# Patient Record
Sex: Female | Born: 1935
Health system: Southern US, Community
[De-identification: ages and names within clinical notes are randomized; demographics above are authoritative.]

## PROBLEM LIST (undated history)

## (undated) DIAGNOSIS — Z9221 Personal history of antineoplastic chemotherapy: Secondary | ICD-10-CM

## (undated) DIAGNOSIS — M48061 Spinal stenosis, lumbar region without neurogenic claudication: Secondary | ICD-10-CM

## (undated) DIAGNOSIS — M25561 Pain in right knee: Secondary | ICD-10-CM

## (undated) DIAGNOSIS — I10 Essential (primary) hypertension: Secondary | ICD-10-CM

## (undated) DIAGNOSIS — C50919 Malignant neoplasm of unspecified site of unspecified female breast: Secondary | ICD-10-CM

## (undated) HISTORY — PX: TONSILLECTOMY: SUR1361

## (undated) HISTORY — PX: TOTAL ABDOMINAL HYSTERECTOMY W/ BILATERAL SALPINGOOPHORECTOMY: SHX83

## (undated) HISTORY — PX: BREAST BIOPSY: SHX20

## (undated) HISTORY — PX: HYSTERECTOMY ABDOMINAL WITH SALPINGECTOMY: SHX6725

## (undated) HISTORY — DX: Pain in right knee: M25.561

## (undated) HISTORY — DX: Spinal stenosis, lumbar region without neurogenic claudication: M48.061

## (undated) HISTORY — PX: ABDOMINAL HYSTERECTOMY: SHX81

---

## 1999-05-31 ENCOUNTER — Ambulatory Visit (HOSPITAL_COMMUNITY): Admission: RE | Admit: 1999-05-31 | Discharge: 1999-05-31 | Payer: Self-pay | Admitting: Gastroenterology

## 1999-05-31 ENCOUNTER — Encounter: Payer: Self-pay | Admitting: Gastroenterology

## 2005-01-02 ENCOUNTER — Encounter: Admission: RE | Admit: 2005-01-02 | Discharge: 2005-01-02 | Payer: Self-pay | Admitting: Family Medicine

## 2014-07-27 LAB — BASIC METABOLIC PANEL
BUN: 15 (ref 4–21)
CREATININE: 0.7 (ref 0.5–1.1)
Glucose: 107
POTASSIUM: 4.1 (ref 3.4–5.3)
SODIUM: 138 (ref 137–147)

## 2014-07-27 LAB — HEPATIC FUNCTION PANEL
ALK PHOS: 80 (ref 25–125)
ALT: 16 (ref 7–35)
AST: 19 (ref 13–35)
Bilirubin, Total: 0.6

## 2014-07-27 LAB — CBC AND DIFFERENTIAL
HEMATOCRIT: 42 (ref 36–46)
HEMOGLOBIN: 13.9 (ref 12.0–16.0)
Platelets: 176 (ref 150–399)
WBC: 6.4

## 2014-07-27 LAB — VITAMIN D 25 HYDROXY (VIT D DEFICIENCY, FRACTURES): VIT D 25 HYDROXY: 21.9

## 2015-01-27 LAB — VITAMIN D 25 HYDROXY (VIT D DEFICIENCY, FRACTURES): Vit D, 25-Hydroxy: 35.3

## 2015-07-28 ENCOUNTER — Other Ambulatory Visit: Payer: Self-pay | Admitting: Orthopedic Surgery

## 2015-07-28 DIAGNOSIS — M5136 Other intervertebral disc degeneration, lumbar region: Secondary | ICD-10-CM

## 2015-08-02 LAB — LIPID PANEL
CHOLESTEROL: 214 — AB (ref 0–200)
HDL: 74 — AB (ref 35–70)
LDL CALC: 111
TRIGLYCERIDES: 144 (ref 40–160)

## 2015-08-02 LAB — BASIC METABOLIC PANEL
BUN: 15 (ref 4–21)
Creatinine: 0.7 (ref 0.5–1.1)
Glucose: 94
Potassium: 3.9 (ref 3.4–5.3)
SODIUM: 140 (ref 137–147)

## 2015-08-02 LAB — HEPATIC FUNCTION PANEL
ALK PHOS: 70 (ref 25–125)
ALT: 14 (ref 7–35)
AST: 20 (ref 13–35)
BILIRUBIN, TOTAL: 0.6

## 2015-08-02 LAB — VITAMIN D 25 HYDROXY (VIT D DEFICIENCY, FRACTURES): Vit D, 25-Hydroxy: 32.8

## 2015-08-16 ENCOUNTER — Ambulatory Visit
Admission: RE | Admit: 2015-08-16 | Discharge: 2015-08-16 | Disposition: A | Discharge status: Home / Self Care | Payer: Self-pay | Source: Ambulatory Visit | Attending: Orthopedic Surgery | Admitting: Orthopedic Surgery

## 2015-08-16 ENCOUNTER — Other Ambulatory Visit: Payer: Self-pay | Admitting: Orthopedic Surgery

## 2015-08-16 ENCOUNTER — Ambulatory Visit
Admission: RE | Admit: 2015-08-16 | Discharge: 2015-08-16 | Disposition: A | Discharge status: Home / Self Care | Payer: Medicare Other | Source: Ambulatory Visit | Attending: Orthopedic Surgery | Admitting: Orthopedic Surgery

## 2015-08-16 DIAGNOSIS — M5137 Other intervertebral disc degeneration, lumbosacral region: Secondary | ICD-10-CM

## 2015-08-16 DIAGNOSIS — M5136 Other intervertebral disc degeneration, lumbar region: Secondary | ICD-10-CM

## 2015-08-16 MED ORDER — IOPAMIDOL (ISOVUE-M 200) INJECTION 41%
15.0000 mL | Freq: Once | INTRAMUSCULAR | Status: AC
  Administered 2015-08-16: 15 mL via INTRATHECAL

## 2015-08-16 NOTE — Discharge Instructions (Signed)

## 2016-07-31 ENCOUNTER — Emergency Department (HOSPITAL_COMMUNITY): Payer: Medicare Other

## 2016-07-31 ENCOUNTER — Emergency Department (HOSPITAL_COMMUNITY)
Admission: EM | Admit: 2016-07-31 | Discharge: 2016-07-31 | Disposition: A | Discharge status: Home / Self Care | Payer: Medicare Other | Attending: Emergency Medicine | Admitting: Emergency Medicine

## 2016-07-31 ENCOUNTER — Encounter (HOSPITAL_COMMUNITY): Payer: Self-pay | Admitting: *Deleted

## 2016-07-31 DIAGNOSIS — J0100 Acute maxillary sinusitis, unspecified: Secondary | ICD-10-CM | POA: Diagnosis not present

## 2016-07-31 DIAGNOSIS — Z79899 Other long term (current) drug therapy: Secondary | ICD-10-CM | POA: Insufficient documentation

## 2016-07-31 DIAGNOSIS — R531 Weakness: Secondary | ICD-10-CM | POA: Diagnosis present

## 2016-07-31 LAB — CBC
HEMATOCRIT: 41.1 % (ref 36.0–46.0)
Hemoglobin: 13.5 g/dL (ref 12.0–15.0)
MCH: 30.7 pg (ref 26.0–34.0)
MCHC: 32.8 g/dL (ref 30.0–36.0)
MCV: 93.4 fL (ref 78.0–100.0)
PLATELETS: 185 10*3/uL (ref 150–400)
RBC: 4.4 MIL/uL (ref 3.87–5.11)
RDW: 13.6 % (ref 11.5–15.5)
WBC: 7.1 10*3/uL (ref 4.0–10.5)

## 2016-07-31 LAB — URINALYSIS, ROUTINE W REFLEX MICROSCOPIC
BACTERIA UA: NONE SEEN
Bilirubin Urine: NEGATIVE
GLUCOSE, UA: NEGATIVE mg/dL
Hgb urine dipstick: NEGATIVE
Ketones, ur: NEGATIVE mg/dL
Nitrite: NEGATIVE
PH: 7 (ref 5.0–8.0)
Protein, ur: NEGATIVE mg/dL
SPECIFIC GRAVITY, URINE: 1.01 (ref 1.005–1.030)

## 2016-07-31 LAB — BASIC METABOLIC PANEL
Anion gap: 6 (ref 5–15)
BUN: 18 mg/dL (ref 6–20)
CHLORIDE: 106 mmol/L (ref 101–111)
CO2: 26 mmol/L (ref 22–32)
CREATININE: 0.79 mg/dL (ref 0.44–1.00)
Calcium: 10.7 mg/dL — ABNORMAL HIGH (ref 8.9–10.3)
GFR calc Af Amer: >60 mL/min (ref >60)
GFR calc non Af Amer: >60 mL/min (ref >60)
Glucose, Bld: 121 mg/dL — ABNORMAL HIGH (ref 65–99)
Potassium: 4.2 mmol/L (ref 3.5–5.1)
SODIUM: 138 mmol/L (ref 135–145)

## 2016-07-31 MED ORDER — DEXAMETHASONE 4 MG PO TABS
10.0000 mg | ORAL_TABLET | Freq: Once | ORAL | Status: AC
  Administered 2016-07-31: 10 mg via ORAL
  Filled 2016-07-31: qty 3

## 2016-07-31 MED ORDER — AMOXICILLIN-POT CLAVULANATE 875-125 MG PO TABS
1.0000 | ORAL_TABLET | Freq: Once | ORAL | Status: AC
Start: 2016-07-31 — End: 2016-07-31
  Administered 2016-07-31: 1 via ORAL
  Filled 2016-07-31: qty 1

## 2016-07-31 MED ORDER — AMOXICILLIN-POT CLAVULANATE 875-125 MG PO TABS: 1.0000 | ORAL_TABLET | Freq: Two times a day (BID) | ORAL | 0 refills | Status: DC

## 2016-07-31 NOTE — ED Notes (Signed)
Upon attempting to give patient Augmentin and decadron, pt dropped augmentin and 1 decadron tablet on the floor. Pharmacy made aware. Augmentin and decadron pulled from pyxis again

## 2016-07-31 NOTE — ED Triage Notes (Signed)
C/o not feeling well for several days states she woke up with ringing in  Her ears  C/o feeling weak. Hot flashes

## 2016-07-31 NOTE — ED Notes (Signed)
Pt placed in a gown  and on cardiac monitor

## 2016-07-31 NOTE — ED Provider Notes (Signed)
[facility] DEPT Provider Note   CSN: [id number] Arrival date & time: 07/31/16  1950     History   Chief Complaint Chief Complaint  Patient presents with  . Weakness    HPI Jamie [name] is a 82 y.o. female.  The history is provided by the patient and medical records. No language interpreter was used.  Dizziness  Quality:  Lightheadedness Severity:  Moderate Onset quality:  Gradual Duration:  1 week Timing:  Intermittent Progression:  Unchanged Relieved by:  Nothing Worsened by:  Nothing Ineffective treatments:  Change in position, turning head, food and fluids Associated symptoms: tinnitus   Associated symptoms: no chest pain, no palpitations, no shortness of breath and no vomiting     History reviewed. No pertinent past medical history.  There are no active problems to display for this patient.   Past Surgical History:  Procedure Laterality Date  . ABDOMINAL HYSTERECTOMY      OB History    No data available       Home Medications    Prior to Admission medications   Medication Sig Start Date End Date Taking? Authorizing Provider  amoxicillin-clavulanate (AUGMENTIN) 875-125 MG tablet Take 1 tablet by mouth every 12 (twelve) hours. 07/31/16   Payton Emerald, MD    Family History No family history on file.  Social History Social History  Substance Use Topics  . Smoking status: Never Smoker  . Smokeless tobacco: Never Used  . Alcohol use No     Allergies   Patient has no known allergies.   Review of Systems Review of Systems  Constitutional: Negative for chills and fever.  HENT: Positive for tinnitus. Negative for ear pain and sore throat.   Eyes: Negative for pain and visual disturbance.  Respiratory: Negative for cough and shortness of breath.   Cardiovascular: Negative for chest pain and palpitations.  Gastrointestinal: Negative for abdominal pain and vomiting.  Genitourinary: Negative for dysuria and hematuria.  Musculoskeletal:  Negative for arthralgias and back pain.  Skin: Negative for color change and rash.  Neurological: Positive for dizziness. Negative for seizures and syncope.  All other systems reviewed and are negative.    Physical Exam Updated Vital Signs BP (!) 168/74   Pulse 65   Temp 97.7 F (36.5 C) (Oral)   Resp 16   Ht 5\' 9"  (1.753 m)   Wt 78 kg (172 lb)   SpO2 100%   BMI 25.40 kg/m   Physical Exam  Constitutional: She appears well-developed. No distress.  HENT:  Head: Normocephalic and atraumatic.  Eyes: Conjunctivae are normal.  Neck: Neck supple.  Cardiovascular: Normal rate and regular rhythm.   No murmur heard. Pulmonary/Chest: Effort normal and breath sounds normal. No respiratory distress.  Abdominal: Soft. There is no tenderness.  Musculoskeletal: She exhibits no edema or deformity.  Neurological: She is alert. No cranial nerve deficit. Coordination normal.  5/5 motor strength and intact sensation in all extremities. Finger-to-nose intact bilaterally  Skin: Skin is warm and dry.  Nursing note and vitals reviewed.    ED Treatments / Results  Labs (all labs ordered are listed, but only abnormal results are displayed) Labs Reviewed  BASIC METABOLIC PANEL - Abnormal; Notable for the following:       Result Value   Glucose, Bld 121 (*)    Calcium 10.7 (*)    All other components within normal limits  URINALYSIS, ROUTINE W REFLEX MICROSCOPIC - Abnormal; Notable for the following:    Color, Urine STRAW (*)  Leukocytes, UA MODERATE (*)    Squamous Epithelial / LPF 0-5 (*)    All other components within normal limits  CBC  CBG MONITORING, ED    EKG  EKG Interpretation  Date/Time:  Monday July 31 2016 09:26:01 EDT Ventricular Rate:  71 PR Interval:  154 QRS Duration: 86 QT Interval:  364 QTC Calculation: 395 R Axis:   -28 Text Interpretation:  Normal sinus rhythm Left ventricular hypertrophy Abnormal ECG No old tracing to compare Confirmed by Deno Etienne [phone])  on 07/31/2016 1:12:44 PM       Radiology Mr Brain Wo Contrast  Result Date: 07/31/2016 CLINICAL DATA:  Lightheadedness, tinnitus. Symptoms for several days. EXAM: MRI HEAD WITHOUT CONTRAST TECHNIQUE: Multiplanar, multiecho pulse sequences of the brain and surrounding structures were obtained without intravenous contrast. COMPARISON:  None. FINDINGS: Brain: No evidence for acute infarction, hemorrhage, mass lesion, hydrocephalus, or extra-axial fluid. Mild atrophy. Mild subcortical and periventricular T2 and FLAIR hyperintensities, likely chronic microvascular ischemic change. Vascular: Flow voids are maintained throughout the carotid, basilar, and vertebral arteries. There are no areas of chronic hemorrhage. Skull and upper cervical spine: Unremarkable visualized calvarium, skullbase, and cervical vertebrae. Pituitary, pineal, cerebellar tonsils unremarkable. Moderate pannus surrounds the odontoid, effacing anterior subarachnoid space, but no cervicomedullary compression. Sinuses/Orbits: Acute layering fluid in the LEFT maxillary sinus. No orbital findings. BILATERAL cataract extraction. Other: None. IMPRESSION: Atrophy and small vessel disease.  No acute intracranial findings. Moderate pannus surrounds the odontoid, correlate clinically for rheumatoid or psoriatic arthritis. No cervicomedullary compression. Acute LEFT maxillary sinusitis. Electronically Signed   By: Staci Righter M.D.   On: 07/31/2016 14:37    Procedures Procedures (including critical care time)  Medications Ordered in ED Medications  dexamethasone (DECADRON) tablet 10 mg (10 mg Oral Given 07/31/16 1524)  amoxicillin-clavulanate (AUGMENTIN) 875-125 MG per tablet 1 tablet (1 tablet Oral Given 07/31/16 1524)     Initial Impression / Assessment and Plan / ED Course  I have reviewed the triage vital signs and the nursing notes.  Pertinent labs & imaging results that were available during my care of the patient were reviewed by me  and considered in my medical decision making (see chart for details).     82 year old female history of hyperlipidemia who presents with 1 week of lightheadedness and tinnitus.  She describes intermittent episodes of tinnitus and lightheadedness, worsening over past day.  She denies any head trauma or falls.  Denies any exacerbation with head turning or position changes.  No previous strokes.  She only takes a statin for hyperlipidemia.  No other anticoagulation.  She denies previous episodes of tinnitus.  Tinnitus was constant and severe this morning.  However has mostly resolved since arrival.  No recent fevers or other infectious symptoms.  Denies other pain.  Patient also concerned about elevated blood pressure but does not know where she normally runs.  Systolic BP ranging from 185-631.  Afebrile, VSS.  No focal neuro deficits.  Lungs clear to auscultation bilaterally.  Abdomen soft benign throughout.  CBC, BMP unremarkable.  UA without evidence UTI.  EKG showing normal sinus rhythm and LVH.  MRI brain showing acute left maxillary sinusitis.  Possible labyrinthitis given tinnitus and dizziness.  Decadron and Augmentin given.  She is ambulating well and tolerating p.o., stable continue outpatient follow-up.  Return precautions provided for worsening symptoms. Pt will f/u with PCP at first availability. Pt verbalized agreement with plan.  Pt care d/w Dr. Tyrone Nine  Final Clinical Impressions(s) / ED Diagnoses  Final diagnoses:  Acute maxillary sinusitis, recurrence not specified    New Prescriptions Discharge Medication List as of 07/31/2016  3:05 PM    START taking these medications   Details  amoxicillin-clavulanate (AUGMENTIN) 875-125 MG tablet Take 1 tablet by mouth every 12 (twelve) hours., Starting Mon 07/31/2016, Print         Payton Emerald, MD 07/31/16 [place], [place], DO 08/01/16 1002

## 2016-07-31 NOTE — ED Notes (Signed)
Patient transported to MRI 

## 2016-08-14 ENCOUNTER — Observation Stay (HOSPITAL_COMMUNITY)
Admission: EM | Admit: 2016-08-14 | Discharge: 2016-08-15 | Disposition: A | Discharge status: Home / Self Care | Payer: Medicare Other | Attending: Internal Medicine | Admitting: Internal Medicine

## 2016-08-14 ENCOUNTER — Emergency Department (HOSPITAL_COMMUNITY): Payer: Medicare Other

## 2016-08-14 ENCOUNTER — Encounter (HOSPITAL_COMMUNITY): Payer: Self-pay

## 2016-08-14 DIAGNOSIS — R82998 Other abnormal findings in urine: Secondary | ICD-10-CM

## 2016-08-14 DIAGNOSIS — R21 Rash and other nonspecific skin eruption: Secondary | ICD-10-CM | POA: Insufficient documentation

## 2016-08-14 DIAGNOSIS — R8299 Other abnormal findings in urine: Secondary | ICD-10-CM

## 2016-08-14 DIAGNOSIS — K7689 Other specified diseases of liver: Secondary | ICD-10-CM | POA: Insufficient documentation

## 2016-08-14 DIAGNOSIS — I739 Peripheral vascular disease, unspecified: Secondary | ICD-10-CM | POA: Insufficient documentation

## 2016-08-14 DIAGNOSIS — K529 Noninfective gastroenteritis and colitis, unspecified: Secondary | ICD-10-CM | POA: Diagnosis present

## 2016-08-14 DIAGNOSIS — Z9841 Cataract extraction status, right eye: Secondary | ICD-10-CM | POA: Diagnosis not present

## 2016-08-14 DIAGNOSIS — K5900 Constipation, unspecified: Secondary | ICD-10-CM | POA: Diagnosis not present

## 2016-08-14 DIAGNOSIS — K5100 Ulcerative (chronic) pancolitis without complications: Secondary | ICD-10-CM

## 2016-08-14 DIAGNOSIS — Z90710 Acquired absence of both cervix and uterus: Secondary | ICD-10-CM | POA: Insufficient documentation

## 2016-08-14 DIAGNOSIS — R42 Dizziness and giddiness: Secondary | ICD-10-CM | POA: Insufficient documentation

## 2016-08-14 DIAGNOSIS — H9319 Tinnitus, unspecified ear: Secondary | ICD-10-CM | POA: Insufficient documentation

## 2016-08-14 DIAGNOSIS — E785 Hyperlipidemia, unspecified: Secondary | ICD-10-CM | POA: Insufficient documentation

## 2016-08-14 DIAGNOSIS — J0100 Acute maxillary sinusitis, unspecified: Secondary | ICD-10-CM | POA: Diagnosis not present

## 2016-08-14 DIAGNOSIS — K921 Melena: Secondary | ICD-10-CM | POA: Diagnosis not present

## 2016-08-14 DIAGNOSIS — Z9842 Cataract extraction status, left eye: Secondary | ICD-10-CM | POA: Insufficient documentation

## 2016-08-14 DIAGNOSIS — I10 Essential (primary) hypertension: Secondary | ICD-10-CM

## 2016-08-14 DIAGNOSIS — D72829 Elevated white blood cell count, unspecified: Secondary | ICD-10-CM | POA: Insufficient documentation

## 2016-08-14 DIAGNOSIS — D649 Anemia, unspecified: Secondary | ICD-10-CM | POA: Diagnosis not present

## 2016-08-14 DIAGNOSIS — I700 Atherosclerosis of aorta: Secondary | ICD-10-CM | POA: Diagnosis not present

## 2016-08-14 HISTORY — DX: Ulcerative (chronic) pancolitis without complications: K51.00

## 2016-08-14 HISTORY — DX: Other abnormal findings in urine: R82.998

## 2016-08-14 HISTORY — DX: Essential (primary) hypertension: I10

## 2016-08-14 LAB — URINALYSIS, ROUTINE W REFLEX MICROSCOPIC
Bilirubin Urine: NEGATIVE
GLUCOSE, UA: NEGATIVE mg/dL
HGB URINE DIPSTICK: NEGATIVE
Ketones, ur: 5 mg/dL — AB
Nitrite: NEGATIVE
PH: 7 (ref 5.0–8.0)
PROTEIN: NEGATIVE mg/dL
Specific Gravity, Urine: 1.032 — ABNORMAL HIGH (ref 1.005–1.030)

## 2016-08-14 LAB — LIPASE, BLOOD: Lipase: 26 U/L (ref 11–51)

## 2016-08-14 LAB — COMPREHENSIVE METABOLIC PANEL
ALK PHOS: 72 U/L (ref 38–126)
ALT: 17 U/L (ref 14–54)
AST: 22 U/L (ref 15–41)
Albumin: 4 g/dL (ref 3.5–5.0)
Anion gap: 8 (ref 5–15)
BILIRUBIN TOTAL: 1.4 mg/dL — AB (ref 0.3–1.2)
BUN: 17 mg/dL (ref 6–20)
CO2: 24 mmol/L (ref 22–32)
Calcium: 9.9 mg/dL (ref 8.9–10.3)
Chloride: 102 mmol/L (ref 101–111)
Creatinine, Ser: 0.84 mg/dL (ref 0.44–1.00)
GFR calc Af Amer: >60 mL/min (ref >60)
Glucose, Bld: 132 mg/dL — ABNORMAL HIGH (ref 65–99)
POTASSIUM: 4 mmol/L (ref 3.5–5.1)
Sodium: 134 mmol/L — ABNORMAL LOW (ref 135–145)
TOTAL PROTEIN: 6.7 g/dL (ref 6.5–8.1)

## 2016-08-14 LAB — DIFFERENTIAL
BASOS ABS: 0 10*3/uL (ref 0.0–0.1)
BASOS PCT: 0 %
EOS ABS: 0 10*3/uL (ref 0.0–0.7)
Eosinophils Relative: 0 %
Lymphocytes Relative: 7 %
Lymphs Abs: 1.7 10*3/uL (ref 0.7–4.0)
Monocytes Absolute: 2.1 10*3/uL — ABNORMAL HIGH (ref 0.1–1.0)
Monocytes Relative: 10 %
NEUTROS ABS: 18.7 10*3/uL — AB (ref 1.7–7.7)
Neutrophils Relative %: 83 %

## 2016-08-14 LAB — TYPE AND SCREEN
ABO/RH(D): O NEG
Antibody Screen: NEGATIVE

## 2016-08-14 LAB — LACTIC ACID, PLASMA: Lactic Acid, Venous: 1.4 mmol/L (ref 0.5–1.9)

## 2016-08-14 LAB — POC OCCULT BLOOD, ED: FECAL OCCULT BLD: POSITIVE — AB

## 2016-08-14 LAB — ABO/RH: ABO/RH(D): O NEG

## 2016-08-14 MED ORDER — IOPAMIDOL (ISOVUE-300) INJECTION 61%
INTRAVENOUS | Status: AC
  Administered 2016-08-14: 100 mL
  Filled 2016-08-14: qty 100

## 2016-08-14 MED ORDER — ONDANSETRON HCL 4 MG/2ML IJ SOLN: 4.0000 mg | Freq: Four times a day (QID) | INTRAMUSCULAR | Status: DC | PRN

## 2016-08-14 MED ORDER — SODIUM CHLORIDE 0.9 % IV BOLUS (SEPSIS)
1000.0000 mL | Freq: Once | INTRAVENOUS | Status: AC
  Administered 2016-08-14: 1000 mL via INTRAVENOUS

## 2016-08-14 MED ORDER — ONDANSETRON HCL 4 MG PO TABS
4.0000 mg | ORAL_TABLET | Freq: Four times a day (QID) | ORAL | Status: DC | PRN
Start: 2016-08-14 — End: 2016-08-15

## 2016-08-14 MED ORDER — HYDROCODONE-ACETAMINOPHEN 5-325 MG PO TABS: 1.0000 | ORAL_TABLET | ORAL | Status: DC | PRN

## 2016-08-14 MED ORDER — ACETAMINOPHEN 650 MG RE SUPP
650.0000 mg | Freq: Four times a day (QID) | RECTAL | Status: DC | PRN
Start: 2016-08-14 — End: 2016-08-15

## 2016-08-14 MED ORDER — VANCOMYCIN 50 MG/ML ORAL SOLUTION
125.0000 mg | Freq: Four times a day (QID) | ORAL | Status: DC
  Administered 2016-08-14 – 2016-08-15 (×3): 125 mg via ORAL
  Filled 2016-08-14 (×4): qty 2.5

## 2016-08-14 MED ORDER — ACETAMINOPHEN 325 MG PO TABS: 650.0000 mg | ORAL_TABLET | Freq: Four times a day (QID) | ORAL | Status: DC | PRN

## 2016-08-14 MED ORDER — PRAVASTATIN SODIUM 40 MG PO TABS
40.0000 mg | ORAL_TABLET | Freq: Every day | ORAL | Status: DC
  Administered 2016-08-14 – 2016-08-15 (×2): 40 mg via ORAL
  Filled 2016-08-14 (×2): qty 1

## 2016-08-14 MED ORDER — SODIUM CHLORIDE 0.9 % IV SOLN
INTRAVENOUS | Status: DC
  Administered 2016-08-14 – 2016-08-15 (×2): via INTRAVENOUS

## 2016-08-14 MED ORDER — DIPHENHYDRAMINE HCL 50 MG/ML IJ SOLN
25.0000 mg | Freq: Three times a day (TID) | INTRAMUSCULAR | Status: DC | PRN
  Administered 2016-08-14: 25 mg via INTRAVENOUS
  Filled 2016-08-14: qty 1

## 2016-08-14 MED ORDER — METRONIDAZOLE IN NACL 5-0.79 MG/ML-% IV SOLN
500.0000 mg | Freq: Once | INTRAVENOUS | Status: AC
  Administered 2016-08-14: 500 mg via INTRAVENOUS
  Filled 2016-08-14: qty 100

## 2016-08-14 MED ORDER — LOSARTAN POTASSIUM 50 MG PO TABS
50.0000 mg | ORAL_TABLET | Freq: Every day | ORAL | Status: DC
  Filled 2016-08-14: qty 1

## 2016-08-14 MED ORDER — DIPHENHYDRAMINE HCL 50 MG/ML IJ SOLN: 25.0000 mg | Freq: Once | INTRAMUSCULAR | Status: DC

## 2016-08-14 MED ORDER — CIPROFLOXACIN IN D5W 400 MG/200ML IV SOLN
400.0000 mg | Freq: Once | INTRAVENOUS | Status: DC
  Filled 2016-08-14: qty 200

## 2016-08-14 MED ORDER — ENOXAPARIN SODIUM 40 MG/0.4ML ~~LOC~~ SOLN
40.0000 mg | SUBCUTANEOUS | Status: DC
  Administered 2016-08-14: 40 mg via SUBCUTANEOUS
  Filled 2016-08-14: qty 0.4

## 2016-08-14 MED ORDER — ASPIRIN 325 MG PO TABS: 325.0000 mg | ORAL_TABLET | Freq: Four times a day (QID) | ORAL | Status: DC | PRN

## 2016-08-14 NOTE — ED Notes (Signed)
Pt endorses weakness upon standing for orthostatics.  Denies any lightheadedness or dizziness.

## 2016-08-14 NOTE — ED Notes (Signed)
Patient transported to CT 

## 2016-08-14 NOTE — ED Triage Notes (Addendum)
Pt states she has had lower abd pain since Saturday. She reports she was able to have a bowel movement and noticed a significant amount of bright red blood. Pt states hx of hemorrhoids but this bleeding was more severe. Pt appears pale in color.

## 2016-08-14 NOTE — H&P (Signed)
History and Physical    LILLIEN PETRONIO IEP:[id number] DOB: 09/12/1934 DOA: 08/14/2016   PCP: [name], MD   Patient coming from:  Home    Chief Complaint:Abdominal pain, diarrhea  HPI: Jamie [name] is a 82 y.o. female with medical history significant for HTN presenting to the ED with abdominal cramping, pain and blood in the stools. In review, the patient had just finished a course of local men team for the treatment of sinusitis. 3 days ago, she attended a picnic, and shortly thereafter, she began to experience abdominal cramping, with nausea without vomiting. She was unable to have a bowel movement, so she had 2 Dulcolax suppositories, with subsequent diarrhea, with blood in her stools in about 3 episodes. On presentation, she has no further episodes of blow the stools. She has poor appetite, continues to be nauseated, and has subjective chills. She denies any fever, or night sweats.Denies any respiratory complaints. Denies any chest pain or palpitations. Denies lower extremity swelling Denies any dysuria. Denies abnormal skin rashes, or neuropathy. Denies any bleeding issues such as epistaxis, hematemesis, hematuria or hematochezia.   ED Course:  BP (!) 126/57   Pulse 73   Temp 98.7 F (37.1 C) (Oral)   Resp 18   SpO2 98%    hcult + WBC  22_> 12 -> 7.1 after hydration and Abx  Hb 14 CT abd pelvis + pancolitis, concern for CDiff. No abscess or free air  CDiff panel pending Received initially Cipro FLagyl, currently on oral Vanc and Flagyl UA Mod leuko  GLu 132  Lipase 26 T bil 1.4   Review of Systems:  As per HPI otherwise all other systems reviewed and are negative  Past Medical History:  Diagnosis Date  . Hypertension     Past Surgical History:  Procedure Laterality Date  . ABDOMINAL HYSTERECTOMY      Social History Social History   Social History  . Marital status: Single    Spouse name: N/A  . Number of children: N/A  . Years of education: N/A    Occupational History  . Not on file.   Social History Main Topics  . Smoking status: Never Smoker  . Smokeless tobacco: Never Used  . Alcohol use No  . Drug use: No  . Sexual activity: Not on file   Other Topics Concern  . Not on file   Social History Narrative  . No narrative on file     No Known Allergies  History reviewed. No pertinent family history.    Prior to Admission medications   Medication Sig Start Date End Date Taking? Authorizing Provider  aspirin 325 MG tablet Take 325 mg by mouth every 6 (six) hours as needed for mild pain or headache.   Yes [provider]  losartan (COZAAR) 50 MG tablet Take 50 mg by mouth daily.   Yes [provider]  Multiple Vitamin (MULTIVITAMIN WITH MINERALS) TABS tablet Take 1 tablet by mouth daily. Shaklee Brand   Yes [provider]  pravastatin (PRAVACHOL) 40 MG tablet Take 40 mg by mouth daily.   Yes [provider]  amoxicillin-clavulanate (AUGMENTIN) 875-125 MG tablet Take 1 tablet by mouth every 12 (twelve) hours. Patient not taking: Reported on 08/14/2016 07/31/16   Payton Emerald, MD    Physical Exam:  Vitals:   08/14/16 1615 08/14/16 1630 08/14/16 1645 08/14/16 1700  BP: (!) 114/47 (!) 132/50 (!) 130/47 (!) 126/57  Pulse: 79 69 72 73  Resp:  Temp:      TempSrc:      SpO2: 98% 96% 96% 98%   Constitutional: NAD, calm, comfortable   Eyes: PERRL, lids and conjunctivae normal ENMT: Mucous membranes are moist, without exudate or lesions  Neck: normal, supple, no masses, no thyromegaly Respiratory: clear to auscultation bilaterally, no wheezing, no crackles. Normal respiratory effort  Cardiovascular: Regular rate and rhythm, mild  murmurs, rubs or gallops. No extremity edema. 2+ pedal pulses. No carotid bruits.  Abdomen: Soft, dissusely mild tenderness especially LUQ,  Bowel sounds positive. NO masses palpable  Musculoskeletal: no clubbing / cyanosis. Moves all extremities Skin: no  jaundice, No lesions.  Neurologic: Sensation intact  Strength equal in all extremities Psychiatric:   Alert and oriented x 3. Normal mood.     Labs on Admission: I have personally reviewed following labs and imaging studies  CBC:  Recent Labs Lab 08/14/16 1134  NEUTROABS 18.7*    Basic Metabolic Panel:  Recent Labs Lab 08/14/16 1134  NA 134*  K 4.0  CL 102  CO2 24  GLUCOSE 132*  BUN 17  CREATININE 0.84  CALCIUM 9.9    GFR: CrCl cannot be calculated (Unknown ideal weight.).  Liver Function Tests:  Recent Labs Lab 08/14/16 1134  AST 22  ALT 17  ALKPHOS 72  BILITOT 1.4*  PROT 6.7  ALBUMIN 4.0    Recent Labs Lab 08/14/16 1134  LIPASE 26   No results for input(s): AMMONIA in the last 168 hours.  Coagulation Profile: No results for input(s): INR, PROTIME in the last 168 hours.  Cardiac Enzymes: No results for input(s): CKTOTAL, CKMB, CKMBINDEX, TROPONINI in the last 168 hours.  BNP (last 3 results) No results for input(s): PROBNP in the last 8760 hours.  HbA1C: No results for input(s): HGBA1C in the last 72 hours.  CBG: No results for input(s): GLUCAP in the last 168 hours.  Lipid Profile: No results for input(s): CHOL, HDL, LDLCALC, TRIG, CHOLHDL, LDLDIRECT in the last 72 hours.  Thyroid Function Tests: No results for input(s): TSH, T4TOTAL, FREET4, T3FREE, THYROIDAB in the last 72 hours.  Anemia Panel: No results for input(s): VITAMINB12, FOLATE, FERRITIN, TIBC, IRON, RETICCTPCT in the last 72 hours.  Urine analysis:    Component Value Date/Time   COLORURINE YELLOW 08/14/2016 1442   APPEARANCEUR CLEAR 08/14/2016 1442   LABSPEC 1.032 (H) 08/14/2016 1442   PHURINE 7.0 08/14/2016 1442   GLUCOSEU NEGATIVE 08/14/2016 1442   HGBUR NEGATIVE 08/14/2016 1442   BILIRUBINUR NEGATIVE 08/14/2016 1442   KETONESUR 5 (A) 08/14/2016 1442   PROTEINUR NEGATIVE 08/14/2016 1442   NITRITE NEGATIVE 08/14/2016 1442   LEUKOCYTESUR MODERATE (A) 08/14/2016  1442    Sepsis Labs: @LABRCNTIP (procalcitonin:4,lacticidven:4) )No results found for this or any previous visit (from the past 240 hour(s)).   Radiological Exams on Admission: Ct Abdomen Pelvis W Contrast  Result Date: 08/14/2016 CLINICAL DATA:  Severe lower abdominal pain over the last 2 days. Constipation. Blood in stool. EXAM: CT ABDOMEN AND PELVIS WITH CONTRAST TECHNIQUE: Multidetector CT imaging of the abdomen and pelvis was performed using the standard protocol following bolus administration of intravenous contrast. CONTRAST:  164mL ISOVUE-300 IOPAMIDOL (ISOVUE-300) INJECTION 61% COMPARISON:  None. FINDINGS: Lower chest: Negative Hepatobiliary: Innumerable subcentimeter low densities within the liver, many of which do not enhance on the delayed imaging. I think most likely diagnosis is multiple hepatic cysts. Certainly, the possibility of other coexistent abnormalities including hemangioma and metastasis is not excluded. No calcified gallstones. Pancreas: Normal Spleen: Normal  Adrenals/Urinary Tract: Adrenal glands are normal. Kidneys are normal. No cyst, mass, stone or hydronephrosis. Stomach/Bowel: Pronounced colitis pattern affecting the the left colon beginning at the distal transverse colon and extending to the descending sigmoid junction. In a person of this age, ischemic colitis is possible. Infectious colitis and inflammatory bowel disease could also cause this appearance. No sign of perforation or abscess. Small bowel appears normal. Vascular/Lymphatic: Aortic atherosclerosis. No aneurysm. IMA appears to show flow. Reproductive: Previous hysterectomy.  No pelvic mass. Other: Small amount of free fluid.  No free air. Musculoskeletal: Chronic degenerative changes affect the lumbar spine. IMPRESSION: Acute colitis pattern beginning at the distal transverse colon and extending to the descending sigmoid junction. Differential diagnosis is ischemic colitis versus infectious colitis versus  inflammatory bowel disease. No evidence of perforation or abscess. Small amount of free fluid. Multiple small low densities within the liver. Most of these included in the delayed imaging region do not enhance. Therefore, these probably represent liver cysts. There could be some admixed hemangiomas. One could not rule out the possibility of some metastatic disease. Liver MRI could most accurately differentiate. Aortic atherosclerosis. Lumbar degenerative changes. Electronically Signed   By: Nelson Chimes M.D.   On: 08/14/2016 14:32    EKG: Independently reviewed.  Assessment/Plan Active Problems:   Pancolitis (HCC)   Hypertension   Leukocytes in urine   Pancolitis, viral versus bacterial Non septic appearing. VSS, Afebrile normal [place]ats WBC normalizing after IVF and antibiotics On oral vanc and Flagyl. Initial Hcult positive, no further bowel movements since admission. Lactic acid pending Admit to med-surg bed  NPO IV antiemetics  C diff pending , check  GI pathogen panel,  Blood culture x 2 IV F 100 cc/h  may consult to GI if getting worse    Hypertension BP ) 126/57   Pulse 73   Controlled Continue home anti-hypertensive medications    Leukocytes in Urine as evidenced by UA. Afebrile, VSS, WBC normalizing, no dysuria  Check urine culture      DVT prophylaxis: Lovenox  Code Status:   Full    Family Communication:  Discussed with patient Disposition Plan: Expect patient to be discharged to home after condition improves Consults called:    None  Admission status Inpatient     [facility] E, PA-C [facility]   08/14/2016, 5:19 PM

## 2016-08-14 NOTE — ED Provider Notes (Signed)
[facility] DEPT Provider Note   CSN: [id number] Arrival date & time: 08/14/16  1122     History   Chief Complaint Chief Complaint  Patient presents with  . Abdominal Pain  . Rectal Bleeding    HPI Jamie [name] is a 82 y.o. female history of hypertension here presenting with blood in the stool, abdominal pain. Patient states that she just finished a course of Augmentin for sinusitis. Patient ate a pick neck 3 days ago and then shortly after has the abdominal cramps. She was unable to have a bowel movement so gave herself Dulcolax suppositories and then subsequently had diarrhea and blood in her stool about 3 episodes. Has poor appetite for the last several days and felt very nauseated. Has subjective chills.     She follows up with Dr. Inda Merlin at [facility].   The history is provided by the patient.    Past Medical History:  Diagnosis Date  . Hypertension     There are no active problems to display for this patient.   Past Surgical History:  Procedure Laterality Date  . ABDOMINAL HYSTERECTOMY      OB History    No data available       Home Medications    Prior to Admission medications   Medication Sig Start Date End Date Taking? Authorizing Provider  amoxicillin-clavulanate (AUGMENTIN) 875-125 MG tablet Take 1 tablet by mouth every 12 (twelve) hours. 07/31/16   Payton Emerald, MD    Family History History reviewed. No pertinent family history.  Social History Social History  Substance Use Topics  . Smoking status: Never Smoker  . Smokeless tobacco: Never Used  . Alcohol use No     Allergies   Patient has no known allergies.   Review of Systems Review of Systems  Gastrointestinal: Positive for abdominal pain, hematochezia and nausea.  All other systems reviewed and are negative.    Physical Exam Updated Vital Signs BP (!) 121/57 (BP Location: Left Arm)   Pulse (!) 102   Temp 98.7 F (37.1 C) (Oral)   Resp 18   SpO2 97%   Physical Exam    Constitutional: She is oriented to person, place, and time.  Dehydrated   HENT:  Head: Normocephalic.  MM dry   Eyes: EOM are normal. Pupils are equal, round, and reactive to light.  Neck: Normal range of motion. Neck supple.  Cardiovascular: Normal rate, regular rhythm and normal heart sounds.   Pulmonary/Chest: Effort normal and breath sounds normal. No respiratory distress. She has no wheezes.  Abdominal: Soft.  Mild diffuse tenderness, worse in the LUQ   Musculoskeletal: Normal range of motion.  Neurological: She is alert and oriented to person, place, and time. No cranial nerve deficit. Coordination normal.  Skin: Skin is warm.  Psychiatric: She has a normal mood and affect.  Nursing note and vitals reviewed.    ED Treatments / Results  Labs (all labs ordered are listed, but only abnormal results are displayed) Labs Reviewed  COMPREHENSIVE METABOLIC PANEL - Abnormal; Notable for the following:       Result Value   Sodium 134 (*)    Glucose, Bld 132 (*)    Total Bilirubin 1.4 (*)    All other components within normal limits  POC OCCULT BLOOD, ED - Abnormal; Notable for the following:    Fecal Occult Bld POSITIVE (*)    All other components within normal limits  LIPASE, BLOOD  URINALYSIS, ROUTINE W REFLEX MICROSCOPIC  CBC WITH  DIFFERENTIAL/PLATELET  DIFFERENTIAL  TYPE AND SCREEN  ABO/RH    EKG  EKG Interpretation None       Radiology No results found.  Procedures Procedures (including critical care time)  Medications Ordered in ED Medications  sodium chloride 0.9 % bolus 1,000 mL (not administered)     Initial Impression / Assessment and Plan / ED Course  I have reviewed the triage vital signs and the nursing notes.  Pertinent labs & imaging results that were available during my care of the patient were reviewed by me and considered in my medical decision making (see chart for details).     Jamie [name] is a 82 y.o. female here with abdominal  pain, rectal bleeding. Recently finished augmentin. Concerned for possible colitis, especially C diff. Appears dehydrated. Occ positive. Will get labs, CT ab/pel.   3:08 PM WBC 12. Hg 14 (lab faxed her CBC as it has trouble resulting in the system). CT showed diffuse colitis. Concerned for C diff. Has poor PO intake. Ordered cipro/ flagyl. I ordered C diff. Will admit.    Final Clinical Impressions(s) / ED Diagnoses   Final diagnoses:  None    New Prescriptions New Prescriptions   No medications on file     Drenda Freeze, MD 08/14/16 [phone]

## 2016-08-15 DIAGNOSIS — I10 Essential (primary) hypertension: Secondary | ICD-10-CM

## 2016-08-15 DIAGNOSIS — K529 Noninfective gastroenteritis and colitis, unspecified: Secondary | ICD-10-CM

## 2016-08-15 DIAGNOSIS — K5100 Ulcerative (chronic) pancolitis without complications: Secondary | ICD-10-CM

## 2016-08-15 DIAGNOSIS — E785 Hyperlipidemia, unspecified: Secondary | ICD-10-CM

## 2016-08-15 DIAGNOSIS — D72829 Elevated white blood cell count, unspecified: Secondary | ICD-10-CM

## 2016-08-15 DIAGNOSIS — D649 Anemia, unspecified: Secondary | ICD-10-CM

## 2016-08-15 LAB — COMPREHENSIVE METABOLIC PANEL
ALK PHOS: 57 U/L (ref 38–126)
ALT: 11 U/L — ABNORMAL LOW (ref 14–54)
ANION GAP: 7 (ref 5–15)
AST: 20 U/L (ref 15–41)
Albumin: 2.9 g/dL — ABNORMAL LOW (ref 3.5–5.0)
BILIRUBIN TOTAL: 1.4 mg/dL — AB (ref 0.3–1.2)
BUN: 12 mg/dL (ref 6–20)
CALCIUM: 9 mg/dL (ref 8.9–10.3)
CO2: 21 mmol/L — ABNORMAL LOW (ref 22–32)
Chloride: 109 mmol/L (ref 101–111)
Creatinine, Ser: 0.69 mg/dL (ref 0.44–1.00)
Glucose, Bld: 86 mg/dL (ref 65–99)
POTASSIUM: 4 mmol/L (ref 3.5–5.1)
Sodium: 137 mmol/L (ref 135–145)
TOTAL PROTEIN: 5.1 g/dL — AB (ref 6.5–8.1)

## 2016-08-15 LAB — GASTROINTESTINAL PANEL BY PCR, STOOL (REPLACES STOOL CULTURE)
ADENOVIRUS F40/41: NOT DETECTED
ASTROVIRUS: NOT DETECTED
CAMPYLOBACTER SPECIES: NOT DETECTED
CYCLOSPORA CAYETANENSIS: NOT DETECTED
Cryptosporidium: NOT DETECTED
ENTEROTOXIGENIC E COLI (ETEC): NOT DETECTED
Entamoeba histolytica: NOT DETECTED
Enteroaggregative E coli (EAEC): NOT DETECTED
Enteropathogenic E coli (EPEC): NOT DETECTED
Giardia lamblia: NOT DETECTED
Norovirus GI/GII: NOT DETECTED
PLESIMONAS SHIGELLOIDES: NOT DETECTED
ROTAVIRUS A: NOT DETECTED
SAPOVIRUS (I, II, IV, AND V): NOT DETECTED
SHIGA LIKE TOXIN PRODUCING E COLI (STEC): NOT DETECTED
Salmonella species: NOT DETECTED
Shigella/Enteroinvasive E coli (EIEC): NOT DETECTED
VIBRIO SPECIES: NOT DETECTED
Vibrio cholerae: NOT DETECTED
Yersinia enterocolitica: NOT DETECTED

## 2016-08-15 LAB — CBC
HCT: 35.4 % — ABNORMAL LOW (ref 36.0–46.0)
HEMOGLOBIN: 11.7 g/dL — AB (ref 12.0–15.0)
MCH: 30.6 pg (ref 26.0–34.0)
MCHC: 33.1 g/dL (ref 30.0–36.0)
MCV: 92.7 fL (ref 78.0–100.0)
PLATELETS: 153 10*3/uL (ref 150–400)
RBC: 3.82 MIL/uL — AB (ref 3.87–5.11)
RDW: 14.2 % (ref 11.5–15.5)
WBC: 12.3 10*3/uL — ABNORMAL HIGH (ref 4.0–10.5)

## 2016-08-15 LAB — C DIFFICILE QUICK SCREEN W PCR REFLEX
C DIFFICILE (CDIFF) TOXIN: NEGATIVE
C DIFFICLE (CDIFF) ANTIGEN: NEGATIVE
C Diff interpretation: NOT DETECTED

## 2016-08-15 MED ORDER — AMOXICILLIN-POT CLAVULANATE 875-125 MG PO TABS: 1.0000 | ORAL_TABLET | Freq: Two times a day (BID) | ORAL | 0 refills | Status: DC

## 2016-08-15 MED ORDER — AMOXICILLIN-POT CLAVULANATE 875-125 MG PO TABS
1.0000 | ORAL_TABLET | Freq: Two times a day (BID) | ORAL | Status: DC
  Administered 2016-08-15: 1 via ORAL
  Filled 2016-08-15: qty 1

## 2016-08-15 MED ORDER — METRONIDAZOLE IN NACL 5-0.79 MG/ML-% IV SOLN
500.0000 mg | Freq: Three times a day (TID) | INTRAVENOUS | Status: DC
Start: 2016-08-15 — End: 2016-08-15

## 2016-08-15 NOTE — Care Management Obs Status (Signed)
[facility] NOTIFICATION   Patient Details  Name: [name] MRN: [id number] Date of Birth: Aug 09, 1934   Medicare Observation Status Notification Given:  Yes    Sharin Mons, RN 08/15/2016, 2:15 PM

## 2016-08-15 NOTE — Progress Notes (Signed)
Pt has red rashes the whole of her skin with the exception of her face, per pt she did not have any rashes prior to coming to the hospital, looks like reaction from medication on call physician KIRBY was notified gave an order for iv benadryl, order carried out will continue to monitor pt for any changes

## 2016-08-15 NOTE — Care Management CC44 (Signed)
Condition Code 44 Documentation Completed  Patient Details  Name: Jamie [name] MRN: [id number] Date of Birth: [birth year]   Condition Code 44 given:  Yes Patient signature on Condition Code 44 notice:  Yes Documentation of 2 MD's agreement:  Yes Code 44 added to claim:  Yes    Sharin Mons, RN 08/15/2016, 2:15 PM

## 2016-08-15 NOTE — Discharge Instructions (Signed)
Colitis Colitis is inflammation of the colon. Colitis may last a short time (acute) or it may last a long time (chronic). What are the causes? This condition may be caused by:  Viruses.  Bacteria.  Reactions to medicine.  Certain autoimmune diseases, such as Crohn disease or ulcerative colitis.  What are the signs or symptoms? Symptoms of this condition include:  Diarrhea.  Passing bloody or tarry stool.  Pain.  Fever.  Vomiting.  Tiredness (fatigue).  Weight loss.  Bloating.  Sudden increase in abdominal pain.  Having fewer bowel movements than usual.  How is this diagnosed? This condition is diagnosed with a stool test or a blood test. You may also have other tests, including X-rays, a CT scan, or a colonoscopy. How is this treated? Treatment may include:  Resting the bowel. This involves not eating or drinking for a period of time.  Fluids that are given through an IV tube.  Medicine for pain and diarrhea.  Antibiotic medicines.  Cortisone medicines.  Surgery.  Follow these instructions at home: Eating and drinking  Follow instructions from your health care provider about eating or drinking restrictions.  Drink enough fluid to keep your urine clear or pale yellow.  Work with a dietitian to determine which foods cause your condition to flare up.  Avoid foods that cause flare-ups.  Eat a well-balanced diet. Medicines  Take over-the-counter and prescription medicines only as told by your health care provider.  If you were prescribed an antibiotic medicine, take it as told by your health care provider. Do not stop taking the antibiotic even if you start to feel better. General instructions  Keep all follow-up visits as told by your health care provider. This is important. Contact a health care provider if:  Your symptoms do not go away.  You develop new symptoms. Get help right away if:  You have a fever that does not go away with  treatment.  You develop chills.  You have extreme weakness, fainting, or dehydration.  You have repeated vomiting.  You develop severe pain in your abdomen.  You pass bloody or tarry stool. This information is not intended to replace advice given to you by your health care provider. Make sure you discuss any questions you have with your health care provider. Document Released: 03/16/2004 Document Revised: 07/15/2015 Document Reviewed: 06/01/2014 Elsevier Interactive Patient Education  2018 Elsevier Inc[employer]  

## 2016-08-15 NOTE — Progress Notes (Signed)
Jamie [name] to be D/C'd Home per MD order.  Discussed with the patient and all questions fully answered.  VSS, Skin clean, dry and intact without evidence of skin break down, no evidence of skin tears noted. IV catheter discontinued intact. Site without signs and symptoms of complications. Dressing and pressure applied.  An After Visit Summary was printed and given to the patient. Patient received prescription.  D/c education completed with patient/family including follow up instructions, medication list, d/c activities limitations if indicated, with other d/c instructions as indicated by MD - patient able to verbalize understanding, all questions fully answered.   Patient instructed to return to ED, call 911, or call MD for any changes in condition.   Patient escorted via [place], and D/C home via private auto. Allergies as of 08/15/2016      Reactions   Flagyl [metronidazole] Rash   Possible cause of rash       Medication List    STOP taking these medications   losartan 50 MG tablet Commonly known as:  COZAAR     TAKE these medications   amoxicillin-clavulanate 875-125 MG tablet Commonly known as:  AUGMENTIN Take 1 tablet by mouth every 12 (twelve) hours. What changed:  when to take this   aspirin 325 MG tablet Take 325 mg by mouth every 6 (six) hours as needed for mild pain or headache.   multivitamin with minerals Tabs tablet Take 1 tablet by mouth daily. Shaklee Brand   pravastatin 40 MG tablet Commonly known as:  PRAVACHOL Take 40 mg by mouth daily.      Betha Loa Farhana Fellows 08/15/2016 6:04 PM

## 2016-08-15 NOTE — Discharge Summary (Signed)
Physician Discharge Summary  Jamie [name]:[id number] DOB: April 22, 1934 DOA: 08/14/2016  PCP: [name], MD  Admit date: 08/14/2016 Discharge date: 08/15/2016  Time spent: 45 minutes  Recommendations for Outpatient Follow-up:  Patient will be discharged to home.  Patient will need to follow up with primary care provider within one week of discharge, repeat CBC.  Follow up with Dr. Earlean Shawl, Gastroenterology, in 1-2 weeks. Follow up on liver densities, will need MRI. Patient should continue medications as prescribed.  Patient should follow a soft diet.   Discharge Diagnoses:  Pancolitis Occult positive Liver densities Mild leukocytosis Normocytic anemia Hypertension Hyperlipidemia  Discharge Condition: Stable  Diet recommendation: soft  Filed Weights   08/14/16 1842  Weight: 77.1 kg (170 lb)    History of present illness:  On 08/14/2016 by Ms. [name], PA ARWEN [name] is a 82 y.o. female with medical history significant for HTN presenting to the ED with abdominal cramping, pain and blood in the stools. In review, the patient had just finished a course of local men team for the treatment of sinusitis. 3 days ago, she attended a picnic, and shortly thereafter, she began to experience abdominal cramping, with nausea without vomiting. She was unable to have a bowel movement, so she had 2 Dulcolax suppositories, with subsequent diarrhea, with blood in her stools in about 3 episodes. On presentation, she has no further episodes of blow the stools. She has poor appetite, continues to be nauseated, and has subjective chills. She denies any fever, or night sweats.Denies any respiratory complaints. Denies any chest pain or palpitations. Denies lower extremity swelling Denies any dysuria. Denies abnormal skin rashes, or neuropathy. Denies any bleeding issues such as epistaxis, hematemesis, hematuria or hematochezia.   Hospital Course:  Pancolitis, viral vs bacterial  -CT Abd  pelvis with contrast: Acute colitis beginning in the distal transverse colon and extending to the descending sigmoid junction, ischemic versus infectious versus inflammatory bowel disease, no perforation or abscess. -Patient was started on by mouth vancomycin as well as Flagyl, both have been discontinued. -Patient began to have a rash overnight after receiving antibiotics -Discussed with infectious disease, Dr. Baxter Flattery, rash possibly secondary to the Flagyl. Agreed with Augmentin -C. difficile negative -GI pathogen panel unremarkable -Patient no longer having diarrhea or abdominal pain -Advanced diet to soft, patient tolerated well -Advised patient to follow-up with Dr. Earlean Shawl, GI, as an outpatient -Discussed using probiotics with patient's daughter  FOBT + -Hemoglobin remaining stable, currently 11.7 -Suspect secondary to colitis and hemorrhoids  Liver densities -Noted on CT scan -Recommended MRI as outpatient -LFTs WNL  Mild leukocytosis -Currently improving, 12.3. Per patient's daughter, WBC was around 20,000 (documented in H&P) -Likely secondary to the above -UA unremarkable for infection -Patient complaining of shortness of breath or cough -Repeat CBC and proximal one week  Normocytic anemia -Hemoglobin currently 11.7, unknown baseline hemoglobin -Repeat CBC in 1 week  Hypertension -Per patient's daughter, she is no longer taking losartan -BP has been low normal during hospitalization  Hyperlipidemia -Continue statin  Procedures: None  Consultations: Dr. Baxter Flattery, infectious disease, via phone  Discharge Exam: Vitals:   08/15/16 0843 08/15/16 1630  BP: (!) 105/33 (!) 110/39  Pulse:  64  Resp:  18  Temp:  98.5 F (36.9 C)   Patient no longer complaining of abdominal pain or diarrhea. Denies chest pain, shortness of breath, dizziness, headache, nausea or vomiting.    General: Well developed, well nourished, NAD, appears stated age  11: NCAT, mucous  membranes moist.  Cardiovascular: S1 S2 auscultated, no rubs, murmurs or gallops. Regular rate and rhythm.  Respiratory: Clear to auscultation bilaterally with equal chest rise  Abdomen: Soft, nontender, nondistended, + bowel sounds  Extremities: warm dry without cyanosis clubbing or edema  Neuro: AAOx3, nonfocal  Psych: Normal affect and demeanor with intact judgement and insight, pleasant  Discharge Instructions Discharge Instructions    Discharge instructions    Complete by:  As directed    Patient will be discharged to home.  Patient will need to follow up with primary care provider within one week of discharge, repeat CBC.  Follow up with Dr. Earlean Shawl, Gastroenterology, in 1-2 weeks. Follow up on liver densities, will need MRI. Patient should continue medications as prescribed.  Patient should follow a soft diet.     Current Discharge Medication List    CONTINUE these medications which have CHANGED   Details  amoxicillin-clavulanate (AUGMENTIN) 875-125 MG tablet Take 1 tablet by mouth every 12 (twelve) hours. Qty: 20 tablet, Refills: 0      CONTINUE these medications which have NOT CHANGED   Details  aspirin 325 MG tablet Take 325 mg by mouth every 6 (six) hours as needed for mild pain or headache.    Multiple Vitamin (MULTIVITAMIN WITH MINERALS) TABS tablet Take 1 tablet by mouth daily. Shaklee Brand    pravastatin (PRAVACHOL) 40 MG tablet Take 40 mg by mouth daily.      STOP taking these medications     losartan (COZAAR) 50 MG tablet        Allergies  Allergen Reactions  . Ciprofloxacin Rash    Pt received both Flagyl and Cipro and then got rash. ? Which one is culprit  . Flagyl [Metronidazole] Rash    See cipro allergy comments   Follow-up Information    [name], MD. Schedule an appointment as soon as possible for a visit in 1 week(s).   Specialty:  Cardiology Why:  Hospital follow up Contact information: [address] [place] [address] [phone]        Richmond Campbell, MD. Schedule an appointment as soon as possible for a visit in 1 week(s).   Specialty:  Gastroenterology Why:  Hospital follow up Contact information: [place] [address] [phone]            The results of significant diagnostics from this hospitalization (including imaging, microbiology, ancillary and laboratory) are listed below for reference.    Significant Diagnostic Studies: Mr Brain Wo Contrast  Result Date: 07/31/2016 CLINICAL DATA:  Lightheadedness, tinnitus. Symptoms for several days. EXAM: MRI HEAD WITHOUT CONTRAST TECHNIQUE: Multiplanar, multiecho pulse sequences of the brain and surrounding structures were obtained without intravenous contrast. COMPARISON:  None. FINDINGS: Brain: No evidence for acute infarction, hemorrhage, mass lesion, hydrocephalus, or extra-axial fluid. Mild atrophy. Mild subcortical and periventricular T2 and FLAIR hyperintensities, likely chronic microvascular ischemic change. Vascular: Flow voids are maintained throughout the carotid, basilar, and vertebral arteries. There are no areas of chronic hemorrhage. Skull and upper cervical spine: Unremarkable visualized calvarium, skullbase, and cervical vertebrae. Pituitary, pineal, cerebellar tonsils unremarkable. Moderate pannus surrounds the odontoid, effacing anterior subarachnoid space, but no cervicomedullary compression. Sinuses/Orbits: Acute layering fluid in the LEFT maxillary sinus. No orbital findings. BILATERAL cataract extraction. Other: None. IMPRESSION: Atrophy and small vessel disease.  No acute intracranial findings. Moderate pannus surrounds the odontoid, correlate clinically for rheumatoid or psoriatic arthritis. No cervicomedullary compression. Acute LEFT maxillary sinusitis. Electronically Signed  By: Staci Righter M.D.   On: 07/31/2016 14:37   Ct Abdomen Pelvis W Contrast  Result Date:  08/14/2016 CLINICAL DATA:  Severe lower abdominal pain over the last 2 days. Constipation. Blood in stool. EXAM: CT ABDOMEN AND PELVIS WITH CONTRAST TECHNIQUE: Multidetector CT imaging of the abdomen and pelvis was performed using the standard protocol following bolus administration of intravenous contrast. CONTRAST:  16mL ISOVUE-300 IOPAMIDOL (ISOVUE-300) INJECTION 61% COMPARISON:  None. FINDINGS: Lower chest: Negative Hepatobiliary: Innumerable subcentimeter low densities within the liver, many of which do not enhance on the delayed imaging. I think most likely diagnosis is multiple hepatic cysts. Certainly, the possibility of other coexistent abnormalities including hemangioma and metastasis is not excluded. No calcified gallstones. Pancreas: Normal Spleen: Normal Adrenals/Urinary Tract: Adrenal glands are normal. Kidneys are normal. No cyst, mass, stone or hydronephrosis. Stomach/Bowel: Pronounced colitis pattern affecting the the left colon beginning at the distal transverse colon and extending to the descending sigmoid junction. In a person of this age, ischemic colitis is possible. Infectious colitis and inflammatory bowel disease could also cause this appearance. No sign of perforation or abscess. Small bowel appears normal. Vascular/Lymphatic: Aortic atherosclerosis. No aneurysm. IMA appears to show flow. Reproductive: Previous hysterectomy.  No pelvic mass. Other: Small amount of free fluid.  No free air. Musculoskeletal: Chronic degenerative changes affect the lumbar spine. IMPRESSION: Acute colitis pattern beginning at the distal transverse colon and extending to the descending sigmoid junction. Differential diagnosis is ischemic colitis versus infectious colitis versus inflammatory bowel disease. No evidence of perforation or abscess. Small amount of free fluid. Multiple small low densities within the liver. Most of these included in the delayed imaging region do not enhance. Therefore, these probably  represent liver cysts. There could be some admixed hemangiomas. One could not rule out the possibility of some metastatic disease. Liver MRI could most accurately differentiate. Aortic atherosclerosis. Lumbar degenerative changes. Electronically Signed   By: Nelson Chimes M.D.   On: 08/14/2016 14:32    Microbiology: Recent Results (from the past 240 hour(s))  C difficile quick scan w PCR reflex     Status: None   Collection Time: 08/14/16  5:57 PM  Result Value Ref Range Status   C Diff antigen NEGATIVE NEGATIVE Final   C Diff toxin NEGATIVE NEGATIVE Final   C Diff interpretation No C. difficile detected.  Final  Gastrointestinal Panel by PCR , Stool     Status: None   Collection Time: 08/14/16  5:57 PM  Result Value Ref Range Status   Campylobacter species NOT DETECTED NOT DETECTED Final   Plesimonas shigelloides NOT DETECTED NOT DETECTED Final   Salmonella species NOT DETECTED NOT DETECTED Final   Yersinia enterocolitica NOT DETECTED NOT DETECTED Final   Vibrio species NOT DETECTED NOT DETECTED Final   Vibrio cholerae NOT DETECTED NOT DETECTED Final   Enteroaggregative E coli (EAEC) NOT DETECTED NOT DETECTED Final   Enteropathogenic E coli (EPEC) NOT DETECTED NOT DETECTED Final   Enterotoxigenic E coli (ETEC) NOT DETECTED NOT DETECTED Final   Shiga like toxin producing E coli (STEC) NOT DETECTED NOT DETECTED Final   Shigella/Enteroinvasive E coli (EIEC) NOT DETECTED NOT DETECTED Final   Cryptosporidium NOT DETECTED NOT DETECTED Final   Cyclospora cayetanensis NOT DETECTED NOT DETECTED Final   Entamoeba histolytica NOT DETECTED NOT DETECTED Final   Giardia lamblia NOT DETECTED NOT DETECTED Final   Adenovirus F40/41 NOT DETECTED NOT DETECTED Final   Astrovirus NOT DETECTED NOT DETECTED Final   Norovirus  GI/GII NOT DETECTED NOT DETECTED Final   Rotavirus A NOT DETECTED NOT DETECTED Final   Sapovirus (I, II, IV, and V) NOT DETECTED NOT DETECTED Final  Culture, blood (Routine X 2) w  Reflex to ID Panel     Status: None (Preliminary result)   Collection Time: 08/14/16  6:18 PM  Result Value Ref Range Status   Specimen Description BLOOD RIGHT WRIST  Final   Special Requests IN PEDIATRIC BOTTLE Blood Culture adequate volume  Final   Culture NO GROWTH < 24 HOURS  Final   Report Status PENDING  Incomplete  Culture, blood (Routine X 2) w Reflex to ID Panel     Status: None (Preliminary result)   Collection Time: 08/14/16  6:25 PM  Result Value Ref Range Status   Specimen Description BLOOD LEFT ARM  Final   Special Requests IN PEDIATRIC BOTTLE Blood Culture adequate volume  Final   Culture NO GROWTH < 24 HOURS  Final   Report Status PENDING  Incomplete     Labs: Basic Metabolic Panel:  Recent Labs Lab 08/14/16 1134 08/15/16 0721  NA 134* 137  K 4.0 4.0  CL 102 109  CO2 24 21*  GLUCOSE 132* 86  BUN 17 12  CREATININE 0.84 0.69  CALCIUM 9.9 9.0   Liver Function Tests:  Recent Labs Lab 08/14/16 1134 08/15/16 0721  AST 22 20  ALT 17 11*  ALKPHOS 72 57  BILITOT 1.4* 1.4*  PROT 6.7 5.1*  ALBUMIN 4.0 2.9*    Recent Labs Lab 08/14/16 1134  LIPASE 26   No results for input(s): AMMONIA in the last 168 hours. CBC:  Recent Labs Lab 08/14/16 1134 08/15/16 0721  WBC  --  12.3*  NEUTROABS 18.7*  --   HGB  --  11.7*  HCT  --  35.4*  MCV  --  92.7  PLT  --  153   Cardiac Enzymes: No results for input(s): CKTOTAL, CKMB, CKMBINDEX, TROPONINI in the last 168 hours. BNP: BNP (last 3 results) No results for input(s): BNP in the last 8760 hours.  ProBNP (last 3 results) No results for input(s): PROBNP in the last 8760 hours.  CBG: No results for input(s): GLUCAP in the last 168 hours.     SignedCristal Ford  [facility] 08/15/2016, 4:52 PM

## 2016-08-16 LAB — URINE CULTURE

## 2016-08-16 LAB — LIPID PANEL
CHOLESTEROL: 166 (ref 0–200)
HDL: 57 (ref 35–70)
LDL CALC: 86
TRIGLYCERIDES: 111 (ref 40–160)

## 2016-08-16 LAB — HEPATIC FUNCTION PANEL
ALK PHOS: 66 (ref 25–125)
ALT: 11 (ref 7–35)
AST: 15 (ref 13–35)
BILIRUBIN, TOTAL: 0.8

## 2016-08-16 LAB — BASIC METABOLIC PANEL
BUN: 13 (ref 4–21)
Creatinine: 0.7 (ref 0.5–1.1)
Glucose: 115

## 2016-08-16 LAB — CBC AND DIFFERENTIAL
HCT: 39 (ref 36–46)
HEMOGLOBIN: 13.1 (ref 12.0–16.0)
Platelets: 179 (ref 150–399)
WBC: 9.1

## 2016-08-16 LAB — VITAMIN D 25 HYDROXY (VIT D DEFICIENCY, FRACTURES): VIT D 25 HYDROXY: 57.4

## 2016-08-18 LAB — CBC AND DIFFERENTIAL
HCT: 36 (ref 36–46)
HEMOGLOBIN: 12.3 (ref 12.0–16.0)
Platelets: 205 (ref 150–399)
WBC: 7.6

## 2016-08-19 LAB — CULTURE, BLOOD (ROUTINE X 2)
Culture: NO GROWTH
Culture: NO GROWTH
Special Requests: ADEQUATE
Special Requests: ADEQUATE

## 2016-09-14 ENCOUNTER — Other Ambulatory Visit: Payer: Self-pay | Admitting: Gastroenterology

## 2016-09-14 DIAGNOSIS — R932 Abnormal findings on diagnostic imaging of liver and biliary tract: Secondary | ICD-10-CM

## 2016-09-27 ENCOUNTER — Ambulatory Visit
Admission: RE | Admit: 2016-09-27 | Discharge: 2016-09-27 | Disposition: A | Discharge status: Home / Self Care | Payer: Medicare Other | Source: Ambulatory Visit | Attending: Gastroenterology | Admitting: Gastroenterology

## 2016-09-27 DIAGNOSIS — R932 Abnormal findings on diagnostic imaging of liver and biliary tract: Secondary | ICD-10-CM

## 2016-09-27 MED ORDER — GADOBENATE DIMEGLUMINE 529 MG/ML IV SOLN
15.0000 mL | Freq: Once | INTRAVENOUS | Status: AC | PRN
  Administered 2016-09-27: 15 mL via INTRAVENOUS

## 2017-03-13 ENCOUNTER — Ambulatory Visit: Payer: Medicare Other | Admitting: Neurology

## 2017-03-13 ENCOUNTER — Encounter: Payer: Self-pay | Admitting: Neurology

## 2017-03-13 VITALS — BP 114/62 | HR 77 | Ht 69.0 in | Wt 168.0 lb

## 2017-03-13 DIAGNOSIS — G20 Parkinson's disease: Secondary | ICD-10-CM | POA: Insufficient documentation

## 2017-03-13 DIAGNOSIS — M25561 Pain in right knee: Secondary | ICD-10-CM

## 2017-03-13 DIAGNOSIS — G20A1 Parkinson's disease without dyskinesia, without mention of fluctuations: Secondary | ICD-10-CM

## 2017-03-13 HISTORY — DX: Parkinson's disease: G20

## 2017-03-13 HISTORY — DX: Parkinson's disease without dyskinesia, without mention of fluctuations: G20.A1

## 2017-03-13 NOTE — Patient Instructions (Addendum)
Labs We will call you for DAT Scan (Dopamine Transport Brain Scan) provided information on the test    Parkinson Disease Parkinson disease is a long-term (chronic) condition that gets worse over time (is progressive). Parkinson disease limits your ability to control your movements and move your body normally. This condition is a type of movement disorder. Each person with Parkinson disease is affected differently. The condition can range from mild to severe. Parkinson disease tends to progress slowly over several years. What are the causes? Parkinson disease results from a loss of brain cells (neurons) in a specific part of the brain (substantia nigra). Some of the neurons in the substantia nigra make an important brain chemical (dopamine). Dopamine is needed to control movement. As the condition gets worse, neurons make less dopamine. This makes it hard to move or control your movements. The exact cause of why neurons are lost or produce less dopamine is not known. Genetic and environmental factors may contribute to the cause of Parkinson disease. What increases the risk? This condition is more likely to develop in:  Men.  People who are 37 years of age or older.  People who have a family history of Parkinson disease.  What are the signs or symptoms? Symptoms of this condition can vary from person to person. The main (primary) symptoms are related to movement (motor symptoms). These include:  Uncontrolled shaking movements (tremor). Tremors usually start in a hand or foot when you are resting (resting tremor). The tremor may stop when you move around.  Slowing of movement. You may lose facial expression and have trouble making the small movements that are needed to button clothing or brush your teeth. You may walk with short, shuffling steps.  Stiff movement (rigidity). This mostly affects your arms, legs, neck, and upper body. You may walk without swinging your arms. Rigidity can be  painful.  Loss of balance and stability when standing. You may sway, fall backward, and have trouble making turns.  Secondary motor symptoms of this condition include:  Shrinking handwriting.  Stooped posture.  Slowed speech.  Trouble swallowing.  Drooling.  Sexual dysfunction.  Muscle cramps.  Loss of smell.  Additional symptoms that are not related to movement include:  Constipation.  Mood swings.  Depression or anxiety.  Sleep disturbances.  Confusion.  Loss of mental abilities (dementia).  Low blood pressure.  Trouble concentrating.  How is this diagnosed? Parkinson disease can be hard to diagnose in its early stages. A diagnosis may be made based on symptoms, a medical history, and physical exam. During your exam, your health care provider will look for:  Lack of facial expression.  Resting tremor.  Stiffness in your neck, arms, and legs.  Abnormal walk.  Trouble with balance.  You may have brain imaging tests done to check for a loss of dopamine-producing areas of the brain. Your healthcare provider may also grade the severity of your condition as mild, moderate, or advanced. Parkinson disease progression is different for everyone. You may not progress to the advanced stage. Mild Parkinson disease involves:  Movement problems that do not affect daily activities.  Movement problems on one side of the body.  Movement problems that are controlled with medicines.  Good response to exercise.  Moderate Parkinson disease involves:  Movement problems on both sides of the body.  Slowing of movement.  Coordination and balance problems.  Less of a response to medicine.  More side effects from medicines.  Advanced Parkinson disease involves:  Extreme difficulty  walking.  Inability to live alone safely.  Signs of dementia.  Difficulty controlling symptoms with medicine.  How is this treated? There is no cure for Parkinson disease.  Treatment focuses on relieving your symptoms. Treatment may include:  Medicines.  Speech, occupational, and physical therapy.  Surgery.  Everyone responds to medicines differently. Your response may change over time. Work with your health care provider to find the best medicines for you. These may include:  Dopamine replacement drugs. These are the most effective medicines. A long-term side effect of these medicines is uncontrolled movements (dyskinesias).  Dopamine agonists. These drugs act like dopamine to stimulate dopamine receptors in the brain. Side effects include nausea and sleepiness, but they cause less dyskinesia.  Other medicines to reduce tremor, prevent dopamine breakdown, reduce dyskinesia, and reduce dementia that is related to Parkinson disease.  Another treatment is deep brain stimulation surgery to reduce tremors and dyskinesia. This procedure involves placing electrodes in the brain. The electrodes are attached to an electric pulse generator that acts like a pacemaker for your brain. This may be an option if you have had the condition for at least four years and are not responding well to medicines. Follow these instructions at home:  Take over-the-counter and prescription medicines only as told by your health care provider.  Install grab bars and railings in your home to prevent falls.  Follow instructions from your health care provider about eating or drinking restrictions.  Return to your normal activities as told by your health care provider. Ask your health care provider what activities are safe for you.  Get regular exercise as told by your health care provider or a physical therapist.  Keep all follow-up visits as told by your health care provider. This is important. These include any visits with a speech therapist or occupational therapist.  Consider joining a support group for people with Parkinson disease. Contact a health care provider if:  Medicines  do not help your symptoms.  You are unsteady or have fallen at home.  You need more support to function well at home.  You have trouble swallowing.  You have severe constipation.  You are struggling with side effects from your medicines.  You see or hear things that are not real (hallucinate).  You feel confused, anxious, or depressed. Get help right away if:  You are injured after a fall.  You cannot swallow without choking.  You have chest pain or trouble breathing.  You do not feel safe at home. This information is not intended to replace advice given to you by your health care provider. Make sure you discuss any questions you have with your health care provider. Document Released: 02/04/2000 Document Revised: 07/12/2015 Document Reviewed: 11/27/2014 Elsevier Interactive Patient Education  [employer].

## 2017-03-13 NOTE — Progress Notes (Addendum)
[facility]    Provider:  Dr Jaynee Eagles Referring Provider: Darcus Austin, MD, Dr. Gladstone Lighter Primary Care Physician:  Darcus Austin, MD  CC:  Difficulty walking  HPI:  Jamie [name] is a 83 y.o. female here as a referral from Dr. Inda Merlin for difficulty walking.  Past medical history right knee pain, right foot pain, spinal stenosis, lumbar pain with radiation down the left leg, degenerative lumbar disc disease, symptoms started a year ago, she started shuffling. Here with daughter-in-law who also provides much information. She feels her posture is fine. She picks her fork up she has some tremor but no resting tremor. She had an MRI in June which did not show any strokes. No falls. She can be walking and go sideways. No freezing episodes. No hypophonia. She sleeps well, she goes to bed early and wakes up early. She hasn;t been able to smell in years, taste appears ok, no weight loss, no difficulty swallowing. Her handwriting is getting very small. No FHx of oarknson's disease. No depression or memory changes. No weakness, she has decreased ROM in the back and on the knee. She has weakness of the right hand. She still mows her yard. Her overall speed has plummeted in everything she does. They attributed it to age. Progressive. Denies pain.   Reviewed notes, labs and imaging from outside physicians, which showed:   MRI brain : personally reviewed and agree with the following:  IMPRESSION: Atrophy and small vessel disease.  No acute intracranial findings.  Reviewed handwritten daughter notes, patient has arthritis especially in the right knee, spinal stenosis, left hip some pain in the morning, scuffles cannot walk right for a year, gradually worsening, cannot pick up feet, not in pain but just cannot pick up feet, right hand is weak, handing money hard to get money back and pocketbook, poor motor control in the right hand.  No pain except for the arthritis in the right knee.  She says for  all can hardly get up again no pain just inability and stiffness.   Patient is seen Dr. Gladstone Lighter with back pain, right thigh pain and right back pain, pain with sitting at night when turning over in the right leg, pain with lying, it feels like it catches when she turns over in pain with sitting in the right thigh.  The patient does not report symptoms of pain with weightbearing or difficulty ambulating.  Using anti-inflammatory meds,  When last being seen she was complaining of pain in the other leg as well, she was started on meloxicam and Tylenol for arthritis. medications.  Exam was significant for mild weakness in the quads, x-rays of both knees showed unremarkable joints fairly good for her age group she has some mild arthritis changes and she was given exercises to perform.  Reviewed notes, patient had lumbar myelogram significant for multilevel spondylosis of the lumbar spine, mild left subarticular narrowing at L2-L3, mild right subarticular narrowing at L3-L4, moderate central canal stenosis at L4-L5 with subarticular narrowing bilaterally, moderate foraminal narrowing at L4-L5 is worse in the right, moderate left and mild right subarticular and foraminal stenosis at L5-S1  MRI of the spine showed multilevel degenerative spondylosis, L4-L5 advanced facet arthrosis ligamentum thickening and severe central canal stenosis with mass-effect on the L5 nerve roots and moderate right neural foraminal stenosis with mild mass-effect on exiting right L4 nerve root.  L5-S1 advanced left facet arthrosis and left lateral discovertebral osteophytosis resulting in moderate to severe left neural foraminal stenosis with  mass-effect on the exiting left L5 nerve root small broad posterior disc protrusion contacts the S1 nerve root without mass-effect.   Review of Systems: Patient complains of symptoms per HPI as well as the following symptoms: Easy bruising, weakness, snoring, flushing, constipation, joint pain,  aching muscles. Pertinent negatives and positives per HPI. All others negative.   Social History   Socioeconomic History  . Marital status: Single    Spouse name: Not on file  . Number of children: Not on file  . Years of education: Not on file  . Highest education level: Not on file  Social Needs  . Financial resource strain: Not on file  . Food insecurity - worry: Not on file  . Food insecurity - inability: Not on file  . Transportation needs - medical: Not on file  . Transportation needs - non-medical: Not on file  Occupational History  . Not on file  Tobacco Use  . Smoking status: Never Smoker  . Smokeless tobacco: Never Used  Substance and Sexual Activity  . Alcohol use: No    Alcohol/week: 0.0 oz  . Drug use: No  . Sexual activity: Not on file  Other Topics Concern  . Not on file  Social History Narrative  . Not on file    Family History  Problem Relation Age of Onset  . Cancer Mother   . Cancer Father     Past Medical History:  Diagnosis Date  . Hypertension    only one time  . Right knee pain   . Spinal stenosis of lumbar region     Past Surgical History:  Procedure Laterality Date  . ABDOMINAL HYSTERECTOMY    . HYSTERECTOMY ABDOMINAL WITH SALPINGECTOMY      Current Outpatient Medications  Medication Sig Dispense Refill  . aspirin 81 MG tablet Take 325 mg by mouth daily.     . Coenzyme Q10 (COQ-10) 100 MG CAPS Take 1 tablet by mouth daily.    . Glucosamine-Chondroitin (GLUCOSAMINE CHONDR COMPLEX PO) Take 1 tablet by mouth daily.    Marland Kitchen losartan (COZAAR) 50 MG tablet Take 50 mg by mouth daily.    . Multiple Vitamin (MULTIVITAMIN WITH MINERALS) TABS tablet Take 1 tablet by mouth daily. Shaklee Brand    . Multiple Vitamins-Minerals (VISION-VITE PRESERVE PO) Take 2 tablets by mouth daily.    . Omega-3 Fatty Acids (OMEGA-3 FISH OIL PO) Take 1 tablet by mouth daily.    . pravastatin (PRAVACHOL) 40 MG tablet Take 40 mg by mouth daily.    . Vitamin D,  Ergocalciferol, (DRISDOL) 50000 units CAPS capsule Take 50,000 Units by mouth every 7 (seven) days.     No current facility-administered medications for this visit.     Allergies as of 03/13/2017 - Review Complete 03/13/2017  Allergen Reaction Noted  . Flagyl [metronidazole] Rash 08/14/2016    Vitals: BP 114/62   Pulse 77   Ht 5\' 9"  (1.753 m)   Wt 168 lb (76.2 kg)   BMI 24.81 kg/m  Last Weight:  Wt Readings from Last 1 Encounters:  03/13/17 168 lb (76.2 kg)   Last Height:   Ht Readings from Last 1 Encounters:  03/13/17 5\' 9"  (1.753 m)   Physical exam: Exam: Gen: NAD, conversant, well nourised, well groomed                     CV: RRR, no MRG. No Carotid Bruits. No peripheral edema, warm, nontender Eyes: Conjunctivae clear without exudates or hemorrhage  Neuro: Detailed Neurologic Exam  Speech:    Speech is normal; fluent and spontaneous with normal comprehension.  Cognition:    The patient is oriented to person, place, and time;     recent and remote memory intact;     language fluent;     normal attention, concentration,     fund of knowledge Cranial Nerves: Hypomimia    The pupils are equal, round, and reactive to light. Attempted fundoscopic exam could not visualize.. Visual fields are full to finger confrontation. Impaired upgaze, otherwise extraocular movements are intact. Trigeminal sensation is intact and the muscles of mastication are normal. The face is symmetric. The palate elevates in the midline. Hearing intact. Voice is normal. Shoulder shrug is normal. The tongue has normal motion without fasciculations.   Coordination:    Dec amplitude with finger taps, bradykinestic  Gait:    Narrow based, en-bloc turning, decreased arm swing  Motor Observation:    No asymmetry, no atrophy, and no involuntary movements noted. No tremor noted Tone:    Cogwheeling   Posture:    Posture is mildly stooped    Strength: Mild LE prox weakness otherwise strength is  V/V in the upper and lower limbs.      Sensation: intact to LT     Reflex Exam:  DTR's:    Deep tendon reflexes in the upper and lower extremities are mildly brisk for age bilaterally.   Toes:    The toes are equiv bilaterally.   Clonus:    Clonus is absent.       Assessment/Plan:  83 year old with parkinsonism, she is bradykinetic, narrow gait, decreased arm swing, hypomimia, mild cogwheeling. No tremor however.  She does have some parkinsonian signs and we can follow-up and follow her clinically and order a DaTscan.  However her gait abnormalities may be due to her significant arthritic disorders including right knee pain, right foot pain, hip pain and multi-joint arthritic disease, and especially severe spinal lumbar central stenosis and radiculopathy (MRI of the spine showed multilevel degenerative spondylosis, most severe at L4-L5 with advanced facet arthrosis, ligamentum thickening and severe central canal stenosis with mass-effect on the L5 nerve roots) which they have been extensively evaluated for with Dr. Gladstone Lighter and unfortunately the only way to address this is with surgery,continue to follow with Dr. Gladstone Lighter orthopaedic surgeon for her spinal stenosis.  DAT Scan to evaluate for parkinson's disease  Fall risk, discussed fall rpescautions Labs: C1 pannus will check RF and CCP antibodies  Orders Placed This Encounter  Procedures  . NM BRAIN IMAGING W/SPECT (DATSCAN)  . Basic Metabolic Panel  . Rheumatoid factor  . RA Qn+CCP(IgG/A)+SjoSSA+SjoSSB     Sarina Ill, MD  [facility] [address] [place], [place] [address]  Phone [phone] Fax [phone]

## 2017-03-15 ENCOUNTER — Telehealth: Payer: Self-pay | Admitting: *Deleted

## 2017-03-15 LAB — BASIC METABOLIC PANEL
BUN/Creatinine Ratio: 24 (ref 12–28)
BUN: 19 mg/dL (ref 8–27)
CALCIUM: 10.7 mg/dL — AB (ref 8.7–10.3)
CO2: 25 mmol/L (ref 20–29)
CREATININE: 0.78 mg/dL (ref 0.57–1.00)
Chloride: 105 mmol/L (ref 96–106)
GFR, EST AFRICAN AMERICAN: 83 mL/min/{1.73_m2} (ref >59)
GFR, EST NON AFRICAN AMERICAN: 72 mL/min/{1.73_m2} (ref >59)
Glucose: 103 mg/dL — ABNORMAL HIGH (ref 65–99)
Potassium: 5.2 mmol/L (ref 3.5–5.2)
Sodium: 143 mmol/L (ref 134–144)

## 2017-03-15 LAB — RA QN+CCP(IGG/A)+SJOSSA+SJOSSB
CYCLIC CITRULLIN PEPTIDE AB: 5 U (ref 0–19)
Rhuematoid fact SerPl-aCnc: 18.9 IU/mL — ABNORMAL HIGH (ref 0.0–13.9)

## 2017-03-15 NOTE — Telephone Encounter (Signed)
Spoke with patient and informed her labs are unremarkable. Patient verbalized understanding. She then stated she is uncertain about having her scan done. She stated she had a ct scan in the past and had a rash reaction to the IV medication. She would like a call back to discuss this with Dr Jaynee Eagles or her nurse. This RN advised will route her request to Dr Jaynee Eagles 's nurse. Patient verbalized appreciation.

## 2017-04-12 ENCOUNTER — Encounter (HOSPITAL_COMMUNITY)
Admission: RE | Admit: 2017-04-12 | Discharge: 2017-04-12 | Disposition: A | Discharge status: Home / Self Care | Payer: Medicare Other | Source: Ambulatory Visit | Attending: Neurology | Admitting: Neurology

## 2017-04-12 ENCOUNTER — Telehealth: Payer: Self-pay | Admitting: *Deleted

## 2017-04-12 DIAGNOSIS — G20 Parkinson's disease: Secondary | ICD-10-CM | POA: Diagnosis present

## 2017-04-12 MED ORDER — IOFLUPANE I 123 185 MBQ/2.5ML IV SOLN
4.5000 | Freq: Once | INTRAVENOUS | Status: AC
  Administered 2017-04-12: 4.5 via INTRAVENOUS

## 2017-04-12 MED ORDER — IODINE STRONG (LUGOLS) 5 % PO SOLN
0.8000 mL | Freq: Once | ORAL | Status: AC
  Administered 2017-04-12: 0.8 mL via ORAL
  Filled 2017-04-12: qty 0.8

## 2017-04-12 NOTE — Telephone Encounter (Signed)
-----   Message from Melvenia Beam, MD sent at 04/12/2017  5:07 PM EST ----- DAT scan is negative for Parkinson's disease.

## 2017-04-12 NOTE — Telephone Encounter (Signed)
Spoke with patient and informed her that her DAT scan was negative for parkinson's disease. Patient was appreciative but has further has further questions ("well something is going on, what is it then?") and would like to come for a visit. Per Dr. Jaynee Eagles, pt can see NP. Scheduled pt for next Wednesday 04/18/17 @ 2:15 arrival time 1:45. She verbalized appreciation and understanding.

## 2017-04-16 NOTE — Telephone Encounter (Signed)
Does she have a dx

## 2017-04-16 NOTE — Telephone Encounter (Signed)
A dat scan is pretty sensitive and specific however it is not 100%, we will have to follow her clinically. There is also the possibility her gait disorder is due to all her other diagnoses such as right knee pain, right foot pain, spinal stenosis, lumbar pain with radiation down the left leg, degenerative lumbar disc disease.  I would like your opinion.

## 2017-04-17 NOTE — Progress Notes (Signed)
[facility]  PATIENT: Jamie [name] DOB: 11-25-1934   REASON FOR VISIT: Follow-up for difficulty walking, DAT scan negative for Parkinson's disease HISTORY FROM: Patient and 2 daughters, [name] and Lelon Frohlich    HISTORY OF PRESENT ILLNESS:UPDATE 2/27/2019CM Jamie [name], 83 year old female returns for follow-up for walking difficulty.  DAT scan performed after her visit in January which was negative for Parkinson's disease.  Patient made aware that A dat scan is pretty sensitive and specific however it is not 100%, we will have to follow her clinically. There is also the possibility her gait disorder is due to all her other diagnoses such as right knee pain, right foot pain, spinal stenosis, lumbar pain with radiation down the left leg, degenerative lumbar disc disease. MRI of the spine showed multilevel degenerative spondylosis, L4-L5 advanced facet arthrosis ligamentum thickening and severe central canal stenosis with mass-effect on the L5 nerve roots and moderate right neural foraminal stenosis with mild mass-effect on exiting right L4 nerve root.  L5-S1 advanced left facet arthrosis and left lateral discovertebral osteophytosis resulting in moderate to severe left neural foraminal stenosis with mass-effect on the exiting left L5 nerve root small broad posterior disc protrusion contacts the S1 nerve root without mass-effect.  She continues to complain with trouble getting up from a chair to standing, sometimes her hands tremble she feels like her hands are weak and she cannot write and she has a stooped posture.  She has not had any recent physical therapy according to the patient.  She does not use an  assistive device .  She denies any falls.  She denies any loss of bowel or bladder control.  She is independent in her activities of daily living.  She continues to drive without difficulty.  She returns for reevaluation 03/13/17 AAShirley F [name] is a 83 y.o. female here as a referral from  Dr. Inda Merlin for difficulty walking.  Past medical history right knee pain, right foot pain, spinal stenosis, lumbar pain with radiation down the left leg, degenerative lumbar disc disease, symptoms started a year ago, she started shuffling. Here with daughter-in-law who also provides much information. She feels her posture is fine. She picks her fork up she has some tremor but no resting tremor. She had an MRI in June which did not show any strokes. No falls. She can be walking and go sideways. No freezing episodes. No hypophonia. She sleeps well, she goes to bed early and wakes up early. She hasn;t been able to smell in years, taste appears ok, no weight loss, no difficulty swallowing. Her handwriting is getting very small. No FHx of oarknson's disease. No depression or memory changes. No weakness, she has decreased ROM in the back and on the knee. She has weakness of the right hand. She still mows her yard. Her overall speed has plummeted in everything she does. They attributed it to age. Progressive. Denies pain REVIEW OF SYSTEMS: Full 14 system review of systems performed and notable only for those listed, all others are neg:  Constitutional: neg  Cardiovascular: neg Ear/Nose/Throat: Ringing in the ears Skin: neg Eyes: neg Respiratory: neg Gastroitestinal: neg  Hematology/Lymphatic: neg  Endocrine: neg Musculoskeletal: Walking difficulty, joint pain back pain Allergy/Immunology: neg Neurological: Weakness Psychiatric: neg Sleep : neg   ALLERGIES: Allergies  Allergen Reactions  . Flagyl [Metronidazole] Rash    Possible cause of rash     HOME MEDICATIONS: Outpatient Medications Prior to Visit  Medication Sig Dispense Refill  . aspirin 81 MG  tablet Take 325 mg by mouth daily.     . Biotin 10000 MCG TABS Take by mouth. 1/2 tablet daily    . Coenzyme Q10 (COQ-10) 100 MG CAPS Take 1 tablet by mouth daily.    . Cyanocobalamin (VITAMIN B 12 PO) Take by mouth. 5, 000 mcg daily    .  Glucosamine-Chondroitin (GLUCOSAMINE CHONDR COMPLEX PO) Take 1 tablet by mouth daily.    Marland Kitchen losartan (COZAAR) 50 MG tablet Take 50 mg by mouth daily.    . Multiple Vitamin (MULTIVITAMIN WITH MINERALS) TABS tablet Take 1 tablet by mouth daily. Shaklee Brand    . Multiple Vitamins-Minerals (VISION-VITE PRESERVE PO) Take 1 tablet by mouth daily. preservision    . Omega-3 Fatty Acids (OMEGA-3 FISH OIL PO) Take 1 tablet by mouth daily.    . polyethylene glycol (MIRALAX / GLYCOLAX) packet Take 17 g by mouth daily.    . pravastatin (PRAVACHOL) 40 MG tablet Take 40 mg by mouth daily.    . Red Yeast Rice Extract (RED YEAST RICE PO) Take by mouth. One tabl daily    . Vitamin D, Ergocalciferol, (DRISDOL) 50000 units CAPS capsule Take 50,000 Units by mouth every 7 (seven) days.     No facility-administered medications prior to visit.     PAST MEDICAL HISTORY: Past Medical History:  Diagnosis Date  . Hypertension    only one time  . Right knee pain   . Spinal stenosis of lumbar region     PAST SURGICAL HISTORY: Past Surgical History:  Procedure Laterality Date  . ABDOMINAL HYSTERECTOMY    . HYSTERECTOMY ABDOMINAL WITH SALPINGECTOMY      FAMILY HISTORY: Family History  Problem Relation Age of Onset  . Cancer Mother   . Cancer Father     SOCIAL HISTORY: Social History   Socioeconomic History  . Marital status: Widowed    Spouse name: Not on file  . Number of children: Not on file  . Years of education: Not on file  . Highest education level: Not on file  Social Needs  . Financial resource strain: Not on file  . Food insecurity - worry: Not on file  . Food insecurity - inability: Not on file  . Transportation needs - medical: Not on file  . Transportation needs - non-medical: Not on file  Occupational History  . Not on file  Tobacco Use  . Smoking status: Never Smoker  . Smokeless tobacco: Never Used  Substance and Sexual Activity  . Alcohol use: No    Alcohol/week: 0.0 oz    . Drug use: No  . Sexual activity: Not on file  Other Topics Concern  . Not on file  Social History Narrative  . Not on file     PHYSICAL EXAM  Vitals:   04/18/17 1351  BP: (!) 165/77  Pulse: 71  Weight: 170 lb 9.6 oz (77.4 kg)  Height: 5\' 9"  (1.753 m)   Body mass index is 25.19 kg/m.  Generalized: Well developed, in no acute distress, well-groomed Head: normocephalic and atraumatic,. Oropharynx benign  Neck: Supple, no carotid bruits  Cardiac: Regular rate rhythm, no murmur  Musculoskeletal: No deformity   Neurological examination   Mentation: Alert oriented to time, place, history taking. Attention span and concentration appropriate. Recent and remote memory intact.  Follows all commands speech and language fluent.   Cranial nerve II-XII: Pupils were equal round reactive to light extraocular movements were full, visual field were full on confrontational test. Facial sensation and  strength were normal. hearing was intact to finger rubbing bilaterally. Uvula tongue midline. head turning and shoulder shrug were normal and symmetric.Tongue protrusion into cheek strength was normal. Motor: normal bulk and tone, full strength in the BUE, bilateral hip flexor weakness both lower extremities  Sensory: normal and symmetric to light touch, pinprick, and  Vibration, in the upper and lower extremities Coordination: finger-nose-finger, heel-to-shin bilaterally, no dysmetria, no resting tremor Reflexes: Brachioradialis 2/2, biceps 2/2, triceps 2/2, patellar 2/2, Achilles 2/2, plantar responses were flexor bilaterally. Gait and Station: Rising up from seated position with push off, stooped posture    moderate stride, mild decreased arm swing  able to perform tiptoe, and heel walking without difficulty. Tandem gait is unsteady.  No assistive device  DIAGNOSTIC DATA (LABS, IMAGING, TESTING) - I reviewed patient records, labs, notes, testing and imaging myself where available.  Lab Results   Component Value Date   WBC 12.3 (H) 08/15/2016   HGB 11.7 (L) 08/15/2016   HCT 35.4 (L) 08/15/2016   MCV 92.7 08/15/2016   PLT 153 08/15/2016      Component Value Date/Time   NA 143 03/13/2017 0846   K 5.2 03/13/2017 0846   CL 105 03/13/2017 0846   CO2 25 03/13/2017 0846   GLUCOSE 103 (H) 03/13/2017 0846   GLUCOSE 86 08/15/2016 0721   BUN 19 03/13/2017 0846   CREATININE 0.78 03/13/2017 0846   CALCIUM 10.7 (H) 03/13/2017 0846   PROT 5.1 (L) 08/15/2016 0721   ALBUMIN 2.9 (L) 08/15/2016 0721   AST 20 08/15/2016 0721   ALT 11 (L) 08/15/2016 0721   ALKPHOS 57 08/15/2016 0721   BILITOT 1.4 (H) 08/15/2016 0721   GFRNONAA 72 03/13/2017 0846   GFRAA 83 03/13/2017 0846   ASSESSMENT AND PLAN 83 year old with mild  Parkinson symptoms, however DaTscan is negative. She has history of hypertension right knee pain and spinal stenosis of lumbar region. Discussed with Dr. Jaynee Eagles  Will set up for PT evaluate and treat Sports med and ortho [place] Continue HEP after PT concluded F/U in 4 months Dennie Bible, [facility], [facility], [facility] [address] [place], [place] [address] [phone]

## 2017-04-18 ENCOUNTER — Ambulatory Visit: Payer: Medicare Other | Admitting: Nurse Practitioner

## 2017-04-18 ENCOUNTER — Encounter: Payer: Self-pay | Admitting: Nurse Practitioner

## 2017-04-18 DIAGNOSIS — R29898 Other symptoms and signs involving the musculoskeletal system: Secondary | ICD-10-CM | POA: Diagnosis not present

## 2017-04-18 DIAGNOSIS — R269 Unspecified abnormalities of gait and mobility: Secondary | ICD-10-CM

## 2017-04-18 DIAGNOSIS — M48061 Spinal stenosis, lumbar region without neurogenic claudication: Secondary | ICD-10-CM | POA: Insufficient documentation

## 2017-04-18 HISTORY — DX: Unspecified abnormalities of gait and mobility: R26.9

## 2017-04-18 HISTORY — DX: Other symptoms and signs involving the musculoskeletal system: R29.898

## 2017-04-18 NOTE — Patient Instructions (Signed)
Will set up for PT  Continue HEP after PT concluded F/U in 4 months

## 2017-04-19 ENCOUNTER — Other Ambulatory Visit: Payer: Self-pay | Admitting: *Deleted

## 2017-04-19 DIAGNOSIS — R269 Unspecified abnormalities of gait and mobility: Secondary | ICD-10-CM

## 2017-04-19 NOTE — Progress Notes (Addendum)
Order placed per Cecille Rubin, NP request.  I spoke to pt and let her know this and that the appt in April was cancelled as she was seen earlier.  Confirmed appt for 08-16-17 at 1015.  She verbalized understanding.

## 2017-05-30 LAB — TSH: TSH: 3.59 (ref 0.41–5.90)

## 2017-05-30 LAB — BASIC METABOLIC PANEL
BUN: 18 (ref 4–21)
CREATININE: 0.7 (ref 0.5–1.1)
Glucose: 98

## 2017-05-30 LAB — HEPATIC FUNCTION PANEL
ALK PHOS: 96 (ref 25–125)
ALT: 16 (ref 7–35)
AST: 22 (ref 13–35)
BILIRUBIN, TOTAL: 0.5

## 2017-06-05 NOTE — Progress Notes (Signed)
Personally  participated in, made any corrections needed, and agree with history, physical, neuro exam,assessment and plan as stated above.    Tarick Parenteau, MD [facility] 

## 2017-06-18 ENCOUNTER — Ambulatory Visit: Payer: Medicare Other | Admitting: Neurology

## 2017-06-27 ENCOUNTER — Encounter: Payer: Self-pay | Admitting: Family Medicine

## 2017-06-27 ENCOUNTER — Ambulatory Visit (INDEPENDENT_AMBULATORY_CARE_PROVIDER_SITE_OTHER): Payer: Medicare Other | Admitting: Family Medicine

## 2017-06-27 VITALS — BP 168/86 | HR 82 | Ht 66.0 in | Wt 170.3 lb

## 2017-06-27 DIAGNOSIS — E663 Overweight: Secondary | ICD-10-CM | POA: Diagnosis not present

## 2017-06-27 DIAGNOSIS — I10 Essential (primary) hypertension: Secondary | ICD-10-CM

## 2017-06-27 DIAGNOSIS — F419 Anxiety disorder, unspecified: Secondary | ICD-10-CM | POA: Diagnosis not present

## 2017-06-27 DIAGNOSIS — F411 Generalized anxiety disorder: Secondary | ICD-10-CM | POA: Insufficient documentation

## 2017-06-27 DIAGNOSIS — R5383 Other fatigue: Secondary | ICD-10-CM | POA: Insufficient documentation

## 2017-06-27 DIAGNOSIS — R29898 Other symptoms and signs involving the musculoskeletal system: Secondary | ICD-10-CM | POA: Diagnosis not present

## 2017-06-27 DIAGNOSIS — Z87898 Personal history of other specified conditions: Secondary | ICD-10-CM | POA: Diagnosis not present

## 2017-06-27 DIAGNOSIS — M47816 Spondylosis without myelopathy or radiculopathy, lumbar region: Secondary | ICD-10-CM | POA: Insufficient documentation

## 2017-06-27 DIAGNOSIS — Z90710 Acquired absence of both cervix and uterus: Secondary | ICD-10-CM

## 2017-06-27 DIAGNOSIS — E785 Hyperlipidemia, unspecified: Secondary | ICD-10-CM | POA: Diagnosis not present

## 2017-06-27 DIAGNOSIS — R269 Unspecified abnormalities of gait and mobility: Secondary | ICD-10-CM | POA: Diagnosis not present

## 2017-06-27 HISTORY — DX: Generalized anxiety disorder: F41.1

## 2017-06-27 HISTORY — DX: Acquired absence of both cervix and uterus: Z90.710

## 2017-06-27 HISTORY — DX: Personal history of other specified conditions: Z87.898

## 2017-06-27 HISTORY — DX: Spondylosis without myelopathy or radiculopathy, lumbar region: M47.816

## 2017-06-27 NOTE — Patient Instructions (Addendum)
-Check blood pressure at home please see below for details on how to do so.  Bring in this blood pressure log next office visit.  2 to 3 days prior to your office visit with me for follow-up of blood pressure, come into the office for a lab only visit to get fasting blood work so we can have it for your upcoming office visit with me.    How to Take Your Blood Pressure Blood pressure is a measurement of how strongly your blood is pressing against the walls of your arteries. Arteries are blood vessels that carry blood from your heart throughout your body. Your health care provider takes your blood pressure at each office visit. You can also take your own blood pressure at home with a blood pressure machine. You may need to take your own blood pressure:  To confirm a diagnosis of high blood pressure (hypertension).  To monitor your blood pressure over time.  To make sure your blood pressure medicine is working.  Supplies needed: To take your blood pressure, you will need a blood pressure machine. You can buy a blood pressure machine, or blood pressure monitor, at most drugstores or online. There are several types of home blood pressure monitors. When choosing one, consider the following:  Choose a monitor that has an arm cuff.  Choose a monitor that wraps snugly around your upper arm. You should be able to fit only one finger between your arm and the cuff.  Do not choose a monitor that measures your blood pressure from your wrist or finger.  Your health care provider can suggest a reliable monitor that will meet your needs. How to prepare To get the most accurate reading, avoid the following for 30 minutes before you check your blood pressure:  Drinking caffeine.  Drinking alcohol.  Eating.  Smoking.  Exercising.  Five minutes before you check your blood pressure:  Empty your bladder.  Sit quietly without talking in a dining chair, rather than in a soft couch or  armchair.  How to take your blood pressure To check your blood pressure, follow the instructions in the manual that came with your blood pressure monitor. If you have a digital blood pressure monitor, the instructions may be as follows: 1. Sit up straight. 2. Place your feet on the floor. Do not cross your ankles or legs. 3. Rest your left arm at the level of your heart on a table or desk or on the arm of a chair. 4. Pull up your shirt sleeve. 5. Wrap the blood pressure cuff around the upper part of your left arm, 1 inch (2.5 cm) above your elbow. It is best to wrap the cuff around bare skin. 6. Fit the cuff snugly around your arm. You should be able to place only one finger between the cuff and your arm. 7. Position the cord inside the groove of your elbow. 8. Press the power button. 9. Sit quietly while the cuff inflates and deflates. 10. Read the digital reading on the monitor screen and write it down (record it). 11. Wait 2-3 minutes, then repeat the steps, starting at step 1.  What does my blood pressure reading mean? A blood pressure reading consists of a higher number over a lower number. Ideally, your blood pressure should be below 120/80. The first ("top") number is called the systolic pressure. It is a measure of the pressure in your arteries as your heart beats. The second ("bottom") number is called the diastolic pressure.  It is a measure of the pressure in your arteries as the heart relaxes. Blood pressure is classified into four stages. The following are the stages for adults who do not have a short-term serious illness or a chronic condition. Systolic pressure and diastolic pressure are measured in a unit called mm Hg. Normal  Systolic pressure: below 277.  Diastolic pressure: below 80. Elevated  Systolic pressure: 412-878.  Diastolic pressure: below 80. Hypertension stage 1  Systolic pressure: 676-720.  Diastolic pressure: 94-70. Hypertension stage 2  Systolic  pressure: 962 or above.  Diastolic pressure: 90 or above. You can have prehypertension or hypertension even if only the systolic or only the diastolic number in your reading is higher than normal. Follow these instructions at home:  Check your blood pressure as often as recommended by your health care provider.  Take your monitor to the next appointment with your health care provider to make sure: ? That you are using it correctly. ? That it provides accurate readings.  Be sure you understand what your goal blood pressure numbers are.  Tell your health care provider if you are having any side effects from blood pressure medicine. Contact a health care provider if:  Your blood pressure is consistently high. Get help right away if:  Your systolic blood pressure is higher than 180.  Your diastolic blood pressure is higher than 110. This information is not intended to replace advice given to you by your health care provider. Make sure you discuss any questions you have with your health care provider. Document Released: 07/16/2015 Document Revised: 09/28/2015 Document Reviewed: 07/16/2015 Elsevier Interactive Patient Education  2018 [employer].     Please realize, EXERCISE IS MEDICINE!  -  American Heart Association ( AHA) guidelines for exercise : If you are in good health, without any medical conditions, you should engage in 150 minutes of moderate intensity aerobic activity per week.  This means you should be huffing and puffing throughout your workout.   Engaging in regular exercise will improve brain function and memory, as well as improve mood, boost immune system and help with weight management.  As well as the other, more well-known effects of exercise such as decreasing blood sugar levels, decreasing blood pressure,  and decreasing bad cholesterol levels/ increasing good cholesterol levels.     -  The AHA strongly endorses consumption of a diet that contains a variety of  foods from all the food categories with an emphasis on fruits and vegetables; fat-free and low-fat dairy products; cereal and grain products; legumes and nuts; and fish, poultry, and/or extra lean meats.    Excessive food intake, especially of foods high in saturated and trans fats, sugar, and salt, should be avoided.    Adequate water intake of roughly 1/2 of your weight in pounds, should equal the ounces of water per day you should drink.  So for instance, if you're 200 pounds, that would be 100 ounces of water per day.         Mediterranean Diet  Why follow it? Research shows. . Those who follow the Mediterranean diet have a reduced risk of heart disease  . The diet is associated with a reduced incidence of Parkinson's and Alzheimer's diseases . People following the diet may have longer life expectancies and lower rates of chronic diseases  . The Dietary Guidelines for Americans recommends the Mediterranean diet as an eating plan to promote health and prevent disease  What Is the Mediterranean Diet?  . Healthy  eating plan based on typical foods and recipes of Mediterranean-style cooking . The diet is primarily a plant based diet; these foods should make up a majority of meals   Starches - Plant based foods should make up a majority of meals - They are an important sources of vitamins, minerals, energy, antioxidants, and fiber - Choose whole grains, foods high in fiber and minimally processed items  - Typical grain sources include wheat, oats, barley, corn, brown rice, bulgar, farro, millet, polenta, couscous  - Various types of beans include chickpeas, lentils, fava beans, black beans, white beans   Fruits  Veggies - Large quantities of antioxidant rich fruits & veggies; 6 or more servings  - Vegetables can be eaten raw or lightly drizzled with oil and cooked  - Vegetables common to the traditional Mediterranean Diet include: artichokes, arugula, beets, broccoli, brussel sprouts, cabbage,  carrots, celery, collard greens, cucumbers, eggplant, kale, leeks, lemons, lettuce, mushrooms, okra, onions, peas, peppers, potatoes, pumpkin, radishes, rutabaga, shallots, spinach, sweet potatoes, turnips, zucchini - Fruits common to the Mediterranean Diet include: apples, apricots, avocados, cherries, clementines, dates, figs, grapefruits, grapes, melons, nectarines, oranges, peaches, pears, pomegranates, strawberries, tangerines  Fats - Replace butter and margarine with healthy oils, such as olive oil, canola oil, and tahini  - Limit nuts to no more than a handful a day  - Nuts include walnuts, almonds, pecans, pistachios, pine nuts  - Limit or avoid candied, honey roasted or heavily salted nuts - Olives are central to the [employer] - can be eaten whole or used in a variety of dishes   Meats Protein - Limiting red meat: no more than a few times a month - When eating red meat: choose lean cuts and keep the portion to the size of deck of cards - Eggs: approx. 0 to 4 times a week  - Fish and lean poultry: at least 2 a week  - Healthy protein sources include, chicken, [place], lean beef, lamb - Increase intake of seafood such as tuna, salmon, trout, mackerel, shrimp, scallops - Avoid or limit high fat processed meats such as sausage and bacon  Dairy - Include moderate amounts of low fat dairy products  - Focus on healthy dairy such as fat free yogurt, skim milk, low or reduced fat cheese - Limit dairy products higher in fat such as whole or 2% milk, cheese, ice cream  Alcohol - Moderate amounts of red wine is ok  - No more than 5 oz daily for women (all ages) and men older than age 81  - No more than 10 oz of wine daily for men younger than 14  Other - Limit sweets and other desserts  - Use herbs and spices instead of salt to flavor foods  - Herbs and spices common to the traditional Mediterranean Diet include: basil, bay leaves, chives, cloves, cumin, fennel, garlic, lavender, marjoram,  mint, oregano, parsley, pepper, rosemary, sage, savory, sumac, tarragon, thyme   It's not just a diet, it's a lifestyle:  . The Mediterranean diet includes lifestyle factors typical of those in the region  . Foods, drinks and meals are best eaten with others and savored . Daily physical activity is important for overall good health . This could be strenuous exercise like running and aerobics . This could also be more leisurely activities such as walking, housework, yard-work, or taking the stairs . Moderation is the key; a balanced and healthy diet accommodates most foods and drinks . Consider portion sizes and frequency  of consumption of certain foods   Meal Ideas & Options:  . Breakfast:  o Whole wheat toast or whole wheat English muffins with peanut butter & hard boiled egg o Steel cut oats topped with apples & cinnamon and skim milk  o Fresh fruit: banana, strawberries, melon, berries, peaches  o Smoothies: strawberries, bananas, greek yogurt, peanut butter o Low fat greek yogurt with blueberries and granola  o Egg white omelet with spinach and mushrooms o Breakfast couscous: whole wheat couscous, apricots, skim milk, cranberries  . Sandwiches:  o Hummus and grilled vegetables (peppers, zucchini, squash) on whole wheat bread   o Grilled chicken on whole wheat pita with lettuce, tomatoes, cucumbers or tzatziki  o Tuna salad on whole wheat bread: tuna salad made with greek yogurt, olives, red peppers, capers, green onions o Garlic rosemary lamb pita: lamb sauted with garlic, rosemary, salt & pepper; add lettuce, cucumber, greek yogurt to pita - flavor with lemon juice and black pepper  . Seafood:  o Mediterranean grilled salmon, seasoned with garlic, basil, parsley, lemon juice and black pepper o Shrimp, lemon, and spinach whole-grain pasta salad made with low fat greek yogurt  o Seared scallops with lemon orzo  o Seared tuna steaks seasoned salt, pepper, coriander topped with tomato  mixture of olives, tomatoes, olive oil, minced garlic, parsley, green onions and cappers  . Meats:  o Herbed greek chicken salad with kalamata olives, cucumber, feta  o Red bell peppers stuffed with spinach, bulgur, lean ground beef (or lentils) & topped with feta   o Kebabs: skewers of chicken, tomatoes, onions, zucchini, squash  o [place] burgers: made with red onions, mint, dill, lemon juice, feta cheese topped with roasted red peppers . Vegetarian o Cucumber salad: cucumbers, artichoke hearts, celery, red onion, feta cheese, tossed in olive oil & lemon juice  o Hummus and whole grain pita points with a greek salad (lettuce, tomato, feta, olives, cucumbers, red onion) o Lentil soup with celery, carrots made with vegetable broth, garlic, salt and pepper  o Tabouli salad: parsley, bulgur, mint, scallions, cucumbers, tomato, radishes, lemon juice, olive oil, salt and pepper.

## 2017-06-27 NOTE — Progress Notes (Signed)
New patient office visit note:  Impression and Recommendations:    1. h/o Essential hypertension   2. h/o Hyperlipidemia, unspecified hyperlipidemia type   3. Weakness of both hips   4. Abnormality of gait   5. History of hysterectomy for benign disease   6. Anxiousness   7. History of dizziness   8. Other fatigue   9. Overweight (BMI 25.0-29.9)    1. H/o essential HTN -BP elevated in office today. Pt is completely asymptomatic. -Check your BP and pulse at home, qd. Keep a log and bring this into next OV.  -will continue to monitor.  -pt has been on losartan, prescribed by Dr. Wynonia Lawman, cardiologist. She was on this briefly before which gave her SE of dizziness. Per pt, she has not been on this "in some time". 2. H/o HLD -check labs in near future. 3. Weakness of both hips/Abnormality of gait -pt has neurologist, who evaluated her in the past with no clear diagnosis.  5. H/o hysterectomy  6. anxiousness -Stop ativan.  -Encouraged pt to walk regularly (at your church or the [employer] with [place]), pray or meditate, or call a friend when she develops symptoms of anxiety or panic.   -Encouraged pt to do activities that keep her brain engaged, like reading a new recipe, learning a new language, reading a book etc.   -Keep a mood journal and write your feelings down.  -Believe this is related to her neurology workup and feeling stressed about being potentially diagnosed with parkinson's.  7. H/o dizziness/Other fatigue -check labs  9. Overweight -Prudent diet and exercise discussed. Get out and walk! Join the [facility] for silver sneakers or go walk at [employer].    -FUP in 6 weeks for fasting blood work.    Education and routine counseling performed. Handouts provided.  Orders Placed This Encounter  Procedures  . CBC with Differential/Platelet  . Comprehensive metabolic panel  . Lipid panel  . TSH  . T4, free  . Hemoglobin A1c  . VITAMIN D 25 Hydroxy (Vit-D  Deficiency, Fractures)    Gross side effects, risk and benefits, and alternatives of medications discussed with patient.  Patient is aware that all medications have potential side effects and we are unable to predict every side effect or drug-drug interaction that may occur.  Expresses verbal understanding and consents to current therapy plan and treatment regimen.  Return for f/up 6 wks- bring in bp log; 2-3 d prior FBW ( 2 appts) .  Please see AVS handed out to patient at the end of our visit for further patient instructions/ counseling done pertaining to today's office visit.    Note: This document was prepared using Dragon voice recognition software and may include unintentional dictation errors.  Pt was in the office today for 60+ minutes, with over 50% time spent in face to face counseling of patients various medical conditions, treatment plans of those medical conditions including medicine management and lifestyle modification, strategies to improve health and well being; and in coordination of care. SEE ABOVE FOR DETAILS  This document serves as a record of services personally performed by [name], DO. It was created on her behalf by Mayer Masker, a trained medical scribe. The creation of this record is based on the scribe's personal observations and the provider's statements to them.   I have reviewed the above medical documentation for accuracy and completeness and I concur.  [name] 06/27/17 5:40 PM  ----------------------------------------------------------------------------------------------------------------------  Subjective:    Chief complaint:   Chief Complaint  Patient presents with  . Establish Care     HPI: Jamie [name] is a pleasant 83 y.o. female who presents to [place] [facility] today to review their medical history with me and establish care.   I asked the patient to review their chronic problem list with me to  ensure everything was updated and accurate.    All recent office visits with other providers, any medical records that patient brought in etc  - I reviewed today.     We asked pt to get [place] their medical records from [facility] providers/ specialists that they had seen within the past 3-5 years- if they are in private practice and/or do not work for [employer], [facility], [facility], [facility] or [employer] owned practice.  Told them to call their specialists to clarify this if they are not sure.    Personal information Her husband passed 11 years ago- she lives alone, but her relatives live 4-5 miles away. She has an older sister next door. She has 3 sons, 1 great granddaughter. 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 She has guy friend she eats dinner or lunch with sometimes.  She is independent.   She has 3 sisters, 2 passed.    Other providers She is changing PCPs, Dr. Darcus Austin with [facility], due to it being 45 minutes away.  She wants a PCP who can take care of her everyday issues.   -Neurology, Dr. Jaynee Eagles, she just finished PT there.   [place] orthopedist (knee) who referred her to neurology. They thought she had Parkinson's but they did not diagnose her with parkinson's and results were inconclusive. She has an appointment with them next month.   -Dr. Wynonia Lawman, cardiologist. She sees him q 1 year for cholesterol and chronic management.  -Dr. [name], dermatologist.  PMHx She does not smoke.   HLD  She has HTN- she was on losartan but has since stopped "for a while". She states she has not taken this since then and she had spells of dizziness after starting this. She was prescribed this by Dr. Wynonia Lawman, cardiologist, who took her off it after she had SE of dizziness.   Anxiety She was prescribed ativan recently (only 20 pills from last PCP), in the last month. She states she feels great in the morning, then will feel tightness in her chest and she "does not feel good". She states if she  is around people or out and about, she will not have any of these episodes, or if these symptoms arise, they will go away if she is around people or doing things. She describes this as an uneasiness or a "bad feeling" inside. Her sister has taken ativan before. These episodes will last all day if she does not have any activities to do. She has no h/o dementia.    She denies SOB, heart palpitations.  She has not talked to a counselor or pastor about her feelings. She denies any new events or stressors that exacerbates her symptoms. Her daughter in law states she was stressed about having a parkinson's diagnosis.    FMHx Breast CA, HTN - mother- died at age 6. Melanoma - Father Sister birth defect- age 65 Older sister, unknown cause of death, passed age 68.    Wt Readings from Last 3 Encounters:  06/27/17 170 lb 4.8 oz (77.2 kg)  04/18/17 170 lb 9.6 oz (77.4 kg)  03/13/17 168 lb (76.2 kg)   BP Readings from Last 3 Encounters:  06/27/17 (!) 168/86  04/18/17 (!) 165/77  03/13/17 114/62   Pulse Readings from Last 3 Encounters:  06/27/17 82  04/18/17 71  03/13/17 77   BMI Readings from Last 3 Encounters:  06/27/17 27.49 kg/m  04/18/17 25.19 kg/m  03/13/17 24.81 kg/m    Patient Care Team    Relationship Specialty Notifications Start End  [name], DO PCP - General Family Medicine  06/27/17   [name], MD Consulting Physician Cardiology  06/27/17   Melvenia Beam, MD Consulting Physician Neurology  06/27/17   Sydnee Levans, MD Referring Physician Dermatology  06/27/17   Rehabilitation, [place]    06/27/17     Patient Active Problem List   Diagnosis Date Noted  . Anxiousness 06/27/2017    Priority: High  . h/o Hypertension 08/14/2016    Priority: High  . h/o Hyperlipidemia     Priority: High  . Parkinsonism [facility])- sees Neuro- Dr Jaynee Eagles 03/13/2017    Priority: Medium  . Lumbar facet arthropathy 06/27/2017  . History of hysterectomy for benign disease  06/27/2017  . History of dizziness 06/27/2017  . Other fatigue 06/27/2017  . Overweight (BMI 25.0-29.9) 06/27/2017  . Weakness of both hips 04/18/2017  . Abnormality of gait 04/18/2017  . Spinal stenosis of lumbar region 04/18/2017  . Pancolitis ([place]) 08/14/2016  . Leukocytes in urine 08/14/2016     Past Medical History:  Diagnosis Date  . Hypertension    only one time  . Right knee pain   . Spinal stenosis of lumbar region      Past Medical History:  Diagnosis Date  . Hypertension    only one time  . Right knee pain   . Spinal stenosis of lumbar region      Past Surgical History:  Procedure Laterality Date  . ABDOMINAL HYSTERECTOMY    . HYSTERECTOMY ABDOMINAL WITH SALPINGECTOMY       Family History  Problem Relation Age of Onset  . Cancer Mother   . Cancer Father      Social History   Substance and Sexual Activity  Drug Use No     Social History   Substance and Sexual Activity  Alcohol Use No  . Alcohol/week: 0.0 oz     Social History   Tobacco Use  Smoking Status Never Smoker  Smokeless Tobacco Never Used     Current Meds  Medication Sig  . aspirin 81 MG tablet Take 325 mg by mouth daily.   . Biotin 10000 MCG TABS Take by mouth. 1/2 tablet daily  . Coenzyme Q10 (COQ-10) 100 MG CAPS Take 1 tablet by mouth daily.  . Cyanocobalamin (VITAMIN B 12 PO) Take by mouth. 5, 000 mcg daily  . losartan (COZAAR) 50 MG tablet Take 50 mg by mouth daily.  . Multiple Vitamins-Minerals (VISION-VITE PRESERVE PO) Take 1 tablet by mouth daily. preservision  . Omega-3 Fatty Acids (OMEGA-3 FISH OIL PO) Take 1 tablet by mouth daily.  . polyethylene glycol (MIRALAX / GLYCOLAX) packet Take 17 g by mouth daily.  . Red Yeast Rice Extract (RED YEAST RICE PO) Take by mouth. One tabl daily  . Vitamin D, Ergocalciferol, (DRISDOL) 50000 units CAPS capsule Take 50,000 Units by mouth every 7 (seven) days.    Allergies: Flagyl [metronidazole]   Review of Systems    Constitutional: Negative for chills, diaphoresis, fever, malaise/fatigue and weight loss.  HENT: Negative for congestion,  sore throat and tinnitus.   Eyes: Negative for blurred vision, double vision and photophobia.  Respiratory: Negative for cough and wheezing.   Cardiovascular: Negative for chest pain and palpitations.  Gastrointestinal: Negative for blood in stool, diarrhea, nausea and vomiting.  Genitourinary: Negative for dysuria, frequency and urgency.  Musculoskeletal: Negative for joint pain and myalgias.  Skin: Negative for itching and rash.  Neurological: Negative for dizziness, focal weakness, weakness and headaches.  Endo/Heme/Allergies: Negative for environmental allergies and polydipsia. Does not bruise/bleed easily.  Psychiatric/Behavioral: Negative for depression and memory loss. The patient is nervous/anxious. The patient does not have insomnia.      Objective:   Blood pressure (!) 168/86, pulse 82, height 5\' 6"  (1.676 m), weight 170 lb 4.8 oz (77.2 kg), SpO2 98 %. Body mass index is 27.49 kg/m. General: Well Developed, well nourished, and in no acute distress.  Neuro: Alert and oriented x3, extra-ocular muscles intact, sensation grossly intact.  HEENT:[place]/AT, PERRLA, neck supple, No carotid bruits Skin: no gross rashes  Cardiac: Regular rate and rhythm Respiratory: Essentially clear to auscultation bilaterally. Not using accessory muscles, speaking in full sentences.  Abdominal: not grossly distended Musculoskeletal: Ambulates w/o diff, FROM * 4 ext.  Vasc: less 2 sec cap RF, warm and pink  Psych:  No HI/SI, judgement and insight good, Euthymic mood. Full Affect.    No results found for this or any previous visit (from the past 2160 hour(s)).

## 2017-08-15 NOTE — Progress Notes (Signed)
[facility]  PATIENT: LEVONIA WOLFLEY DOB: 1934/08/24   REASON FOR VISIT: Follow-up for difficulty walking, DAT scan negative for Parkinson's disease HISTORY FROM: Patient and 2 daughters, [name] and [place] ILLNESS:UPDATE 6/27/2019CM Ms. Nguyen, 83 year old female returns for follow-up with history of walking difficulty.  DaTscan was negative for  Parkinson's disease.  She also has a history of right knee pain right foot pain spinal stenosis lumbar pain degenerative right foot pain, all of these could contribute to her gait disorder.  When last seen she was asked to take some physical therapy.  She did this but has not followed a home exercise program.  She states therapy was very beneficial for follow-up she continues to drive without difficulty.  She is independent in all activities of daily living.  She denies any falls since last seen.  She returns for reevaluation    UPDATE 2/27/2019CM Ms Hope, 83 year old female returns for follow-up for walking difficulty.  DAT scan performed after her visit in January which was negative for Parkinson's disease.  Patient made aware that A dat scan is pretty sensitive and specific however it is not 100%, we will have to follow her clinically. There is also the possibility her gait disorder is due to all her other diagnoses such as right knee pain, right foot pain, spinal stenosis, lumbar pain with radiation down the left leg, degenerative lumbar disc disease. MRI of the spine showed multilevel degenerative spondylosis,.  She denies any falls.  She denies any loss of bowel or bladder control she continues to drive without difficulty and remains independent in activities of daily living her.  She returns for reevaluation L4-L5 advanced facet arthrosis ligamentum thickening and severe central canal stenosis with mass-effect on the L5 nerve roots and moderate right neural foraminal stenosis with mild mass-effect on  exiting right L4 nerve root.  L5-S1 advanced left facet arthrosis and left lateral discovertebral osteophytosis resulting in moderate to severe left neural foraminal stenosis with mass-effect on the exiting left L5 nerve root small broad posterior disc protrusion contacts the S1 nerve root without mass-effect.  She continues to complain with trouble getting up from a chair to standing, sometimes her hands tremble she feels like her hands are weak and she cannot write and she has a stooped posture.  She has not had any recent physical therapy according to the patient.  She does not use an  assistive device .  She denies any falls.  She denies any loss of bowel or bladder control.  She is independent in her activities of daily living.  She continues to drive without difficulty.  She returns for reevaluation 03/13/17 AAShirley F Sappington is a 83 y.o. female here as a referral from Dr. Inda Merlin for difficulty walking.  Past medical history right knee pain, right foot pain, spinal stenosis, lumbar pain with radiation down the left leg, degenerative lumbar disc disease, symptoms started a year ago, she started shuffling. Here with daughter-in-law who also provides much information. She feels her posture is fine. She picks her fork up she has some tremor but no resting tremor. She had an MRI in June which did not show any strokes. No falls. She can be walking and go sideways. No freezing episodes. No hypophonia. She sleeps well, she goes to bed early and wakes up early. She hasn;t been able to smell in years, taste appears ok, no weight loss, no difficulty swallowing. Her handwriting is  getting very small. No FHx of oarknson's disease. No depression or memory changes. No weakness, she has decreased ROM in the back and on the knee. She has weakness of the right hand. She still mows her yard. Her overall speed has plummeted in everything she does. They attributed it to age. Progressive. Denies pain REVIEW OF SYSTEMS: Full 14  system review of systems performed and notable only for those listed, all others are neg:  Constitutional: neg  Cardiovascular: neg Ear/Nose/Throat: Ringing in the ears Skin: neg Eyes: neg Respiratory: neg Gastroitestinal: neg  Hematology/Lymphatic: neg  Endocrine: neg Musculoskeletal: Walking difficulty, joint pain back pain Allergy/Immunology: neg Neurological: Weakness Psychiatric: neg Sleep : neg   ALLERGIES: Allergies  Allergen Reactions  . Flagyl [Metronidazole] Rash    Possible cause of rash     HOME MEDICATIONS: Outpatient Medications Prior to Visit  Medication Sig Dispense Refill  . aspirin 81 MG tablet Take 325 mg by mouth daily.     . Biotin 10000 MCG TABS Take by mouth. 1/2 tablet daily    . Coenzyme Q10 (COQ-10) 100 MG CAPS Take 1 tablet by mouth daily.    . Cyanocobalamin (VITAMIN B 12 PO) Take by mouth. 5, 000 mcg daily    . Multiple Vitamins-Minerals (VISION-VITE PRESERVE PO) Take 1 tablet by mouth daily. preservision    . Omega-3 Fatty Acids (OMEGA-3 FISH OIL PO) Take 1 tablet by mouth daily.    . Red Yeast Rice Extract (RED YEAST RICE PO) Take by mouth. One tabl daily    . polyethylene glycol (MIRALAX / GLYCOLAX) packet Take 17 g by mouth daily.    Marland Kitchen losartan (COZAAR) 50 MG tablet Take 50 mg by mouth daily.    . Vitamin D, Ergocalciferol, (DRISDOL) 50000 units CAPS capsule Take 50,000 Units by mouth every 7 (seven) days.     No facility-administered medications prior to visit.     PAST MEDICAL HISTORY: Past Medical History:  Diagnosis Date  . Hypertension    only one time  . Right knee pain   . Spinal stenosis of lumbar region     PAST SURGICAL HISTORY: Past Surgical History:  Procedure Laterality Date  . ABDOMINAL HYSTERECTOMY    . HYSTERECTOMY ABDOMINAL WITH SALPINGECTOMY      FAMILY HISTORY: Family History  Problem Relation Age of Onset  . Cancer Mother   . Cancer Father     SOCIAL HISTORY: Social History   Socioeconomic History    . Marital status: Widowed    Spouse name: Not on file  . Number of children: Not on file  . Years of education: Not on file  . Highest education level: Not on file  Occupational History  . Not on file  Social Needs  . Financial resource strain: Not on file  . Food insecurity:    Worry: Not on file    Inability: Not on file  . Transportation needs:    Medical: Not on file    Non-medical: Not on file  Tobacco Use  . Smoking status: Never Smoker  . Smokeless tobacco: Never Used  Substance and Sexual Activity  . Alcohol use: No    Alcohol/week: 0.0 oz  . Drug use: No  . Sexual activity: Not Currently  Lifestyle  . Physical activity:    Days per week: Not on file    Minutes per session: Not on file  . Stress: Not on file  Relationships  . Social connections:    Talks on phone: Not  on file    Gets together: Not on file    Attends religious service: Not on file    Active member of club or organization: Not on file    Attends meetings of clubs or organizations: Not on file    Relationship status: Not on file  . Intimate partner violence:    Fear of current or ex partner: Not on file    Emotionally abused: Not on file    Physically abused: Not on file    Forced sexual activity: Not on file  Other Topics Concern  . Not on file  Social History Narrative  . Not on file     PHYSICAL EXAM  Vitals:   08/16/17 0949  BP: (!) 147/82  Pulse: 71  Weight: 171 lb 12.8 oz (77.9 kg)  Height: 5\' 6"  (1.676 m)   Body mass index is 27.73 kg/m.  Generalized: Well developed, in no acute distress, well-groomed Head: normocephalic and atraumatic,. Oropharynx benign  Neck: Supple, no carotid bruits  Cardiac: Regular rate rhythm, no murmur  Musculoskeletal: No deformity   Neurological examination   Mentation: Alert oriented to time, place, history taking. Attention span and concentration appropriate. Recent and remote memory intact.  Follows all commands speech and language  fluent.   Cranial nerve II-XII: Pupils were equal round reactive to light extraocular movements were full, visual field were full on confrontational test. Facial sensation and strength were normal. hearing was intact to finger rubbing bilaterally. Uvula tongue midline. head turning and shoulder shrug were normal and symmetric.Tongue protrusion into cheek strength was normal. Motor: normal bulk and tone, full strength in the BUE, mild bilateral hip flexor weakness both lower extremities  Sensory: normal and symmetric to light touch, pinprick, and  Vibration, in the upper and lower extremities Coordination: finger-nose-finger, heel-to-shin bilaterally, no dysmetria, no resting tremor Reflexes: Brachioradialis 2/2, biceps 2/2, triceps 2/2, patellar 2/2, Achilles 2/2, plantar responses were flexor bilaterally. Gait and Station: Rising up from seated position with push off, stooped posture    moderate stride, mild decreased arm swing  able to perform tiptoe, and heel walking without difficulty. Tandem gait is unsteady.  No assistive device  DIAGNOSTIC DATA (LABS, IMAGING, TESTING) - I reviewed patient records, labs, notes, testing and imaging myself where available.  Lab Results  Component Value Date   WBC 7.6 08/18/2016   HGB 12.3 08/18/2016   HCT 36 08/18/2016   MCV 92.7 08/15/2016   PLT 205 08/18/2016      Component Value Date/Time   NA 143 03/13/2017 0846   K 5.2 03/13/2017 0846   CL 105 03/13/2017 0846   CO2 25 03/13/2017 0846   GLUCOSE 103 (H) 03/13/2017 0846   GLUCOSE 86 08/15/2016 0721   BUN 18 05/30/2017   CREATININE 0.7 05/30/2017   CREATININE 0.78 03/13/2017 0846   CALCIUM 10.7 (H) 03/13/2017 0846   PROT 5.1 (L) 08/15/2016 0721   ALBUMIN 2.9 (L) 08/15/2016 0721   AST 22 05/30/2017   ALT 16 05/30/2017   ALKPHOS 96 05/30/2017   BILITOT 1.4 (H) 08/15/2016 0721   GFRNONAA 72 03/13/2017 0846   GFRAA 83 03/13/2017 0846   ASSESSMENT AND PLAN 83 year old with mild  Parkinson  symptoms, however DaTscan is negative. She has history of hypertension right knee pain and spinal stenosis of lumbar region.  PLAN: Continue home exercise program at least daily F/U in 6 months to continue symptoms Dennie Bible, [facility], [facility], APRN  [facility] [address],  [place], [place] [address] [phone]

## 2017-08-16 ENCOUNTER — Ambulatory Visit: Payer: Medicare Other | Admitting: Nurse Practitioner

## 2017-08-16 ENCOUNTER — Encounter: Payer: Self-pay | Admitting: Nurse Practitioner

## 2017-08-16 VITALS — BP 147/82 | HR 71 | Ht 66.0 in | Wt 171.8 lb

## 2017-08-16 DIAGNOSIS — R29898 Other symptoms and signs involving the musculoskeletal system: Secondary | ICD-10-CM

## 2017-08-16 DIAGNOSIS — R269 Unspecified abnormalities of gait and mobility: Secondary | ICD-10-CM | POA: Diagnosis not present

## 2017-08-16 DIAGNOSIS — M48061 Spinal stenosis, lumbar region without neurogenic claudication: Secondary | ICD-10-CM | POA: Diagnosis not present

## 2017-08-16 NOTE — Progress Notes (Signed)
Personally  participated in, made any corrections needed, and agree with history, physical, neuro exam,assessment and plan as stated above.    Devoiry Corriher, MD [facility] 

## 2017-08-16 NOTE — Patient Instructions (Signed)
Continue home exercise program at least daily F/U in 83months

## 2017-08-20 ENCOUNTER — Other Ambulatory Visit: Payer: Medicare Other

## 2017-08-20 DIAGNOSIS — E785 Hyperlipidemia, unspecified: Secondary | ICD-10-CM

## 2017-08-20 DIAGNOSIS — R5383 Other fatigue: Secondary | ICD-10-CM

## 2017-08-20 DIAGNOSIS — Z87898 Personal history of other specified conditions: Secondary | ICD-10-CM

## 2017-08-20 DIAGNOSIS — E663 Overweight: Secondary | ICD-10-CM

## 2017-08-20 DIAGNOSIS — I10 Essential (primary) hypertension: Secondary | ICD-10-CM

## 2017-08-20 DIAGNOSIS — F419 Anxiety disorder, unspecified: Secondary | ICD-10-CM

## 2017-08-21 LAB — CBC WITH DIFFERENTIAL/PLATELET
Basophils Absolute: 0.1 10*3/uL (ref 0.0–0.2)
Basos: 1 %
EOS (ABSOLUTE): 0.2 10*3/uL (ref 0.0–0.4)
EOS: 2 %
HEMATOCRIT: 42.3 % (ref 34.0–46.6)
HEMOGLOBIN: 13.4 g/dL (ref 11.1–15.9)
IMMATURE GRANULOCYTES: 0 %
Immature Grans (Abs): 0 10*3/uL (ref 0.0–0.1)
LYMPHS: 32 %
Lymphocytes Absolute: 2.2 10*3/uL (ref 0.7–3.1)
MCH: 29.5 pg (ref 26.6–33.0)
MCHC: 31.7 g/dL (ref 31.5–35.7)
MCV: 93 fL (ref 79–97)
Monocytes Absolute: 0.7 10*3/uL (ref 0.1–0.9)
Monocytes: 10 %
NEUTROS PCT: 55 %
Neutrophils Absolute: 3.8 10*3/uL (ref 1.4–7.0)
Platelets: 217 10*3/uL (ref 150–450)
RBC: 4.55 x10E6/uL (ref 3.77–5.28)
RDW: 14.2 % (ref 12.3–15.4)
WBC: 6.8 10*3/uL (ref 3.4–10.8)

## 2017-08-21 LAB — COMPREHENSIVE METABOLIC PANEL
A/G RATIO: 2.2 (ref 1.2–2.2)
ALT: 14 IU/L (ref 0–32)
AST: 20 IU/L (ref 0–40)
Albumin: 4.8 g/dL — ABNORMAL HIGH (ref 3.5–4.7)
Alkaline Phosphatase: 95 IU/L (ref 39–117)
BILIRUBIN TOTAL: 0.5 mg/dL (ref 0.0–1.2)
BUN/Creatinine Ratio: 21 (ref 12–28)
BUN: 14 mg/dL (ref 8–27)
CALCIUM: 10.3 mg/dL (ref 8.7–10.3)
CHLORIDE: 106 mmol/L (ref 96–106)
CO2: 25 mmol/L (ref 20–29)
Creatinine, Ser: 0.67 mg/dL (ref 0.57–1.00)
GFR calc Af Amer: 95 mL/min/{1.73_m2} (ref >59)
GFR, EST NON AFRICAN AMERICAN: 83 mL/min/{1.73_m2} (ref >59)
Globulin, Total: 2.2 g/dL (ref 1.5–4.5)
Glucose: 103 mg/dL — ABNORMAL HIGH (ref 65–99)
Potassium: 4.9 mmol/L (ref 3.5–5.2)
Sodium: 143 mmol/L (ref 134–144)
Total Protein: 7 g/dL (ref 6.0–8.5)

## 2017-08-21 LAB — LIPID PANEL
CHOL/HDL RATIO: 3.7 ratio (ref 0.0–4.4)
Cholesterol, Total: 273 mg/dL — ABNORMAL HIGH (ref 100–199)
HDL: 74 mg/dL (ref >39)
LDL CALC: 169 mg/dL — AB (ref 0–99)
TRIGLYCERIDES: 150 mg/dL — AB (ref 0–149)
VLDL Cholesterol Cal: 30 mg/dL (ref 5–40)

## 2017-08-21 LAB — T4, FREE: Free T4: 1.11 ng/dL (ref 0.82–1.77)

## 2017-08-21 LAB — HEMOGLOBIN A1C
ESTIMATED AVERAGE GLUCOSE: 126 mg/dL
HEMOGLOBIN A1C: 6 % — AB (ref 4.8–5.6)

## 2017-08-21 LAB — VITAMIN D 25 HYDROXY (VIT D DEFICIENCY, FRACTURES): VIT D 25 HYDROXY: 69.1 ng/mL (ref 30.0–100.0)

## 2017-08-21 LAB — TSH: TSH: 3.88 u[IU]/mL (ref 0.450–4.500)

## 2017-08-22 ENCOUNTER — Encounter: Payer: Self-pay | Admitting: Family Medicine

## 2017-08-22 ENCOUNTER — Ambulatory Visit: Payer: Medicare Other | Admitting: Family Medicine

## 2017-08-22 VITALS — BP 169/97 | HR 76 | Ht 66.0 in | Wt 169.8 lb

## 2017-08-22 DIAGNOSIS — F419 Anxiety disorder, unspecified: Secondary | ICD-10-CM

## 2017-08-22 DIAGNOSIS — G20 Parkinson's disease: Secondary | ICD-10-CM

## 2017-08-22 DIAGNOSIS — E782 Mixed hyperlipidemia: Secondary | ICD-10-CM | POA: Diagnosis not present

## 2017-08-22 DIAGNOSIS — R7303 Prediabetes: Secondary | ICD-10-CM | POA: Diagnosis not present

## 2017-08-22 DIAGNOSIS — R030 Elevated blood-pressure reading, without diagnosis of hypertension: Secondary | ICD-10-CM

## 2017-08-22 DIAGNOSIS — Z8639 Personal history of other endocrine, nutritional and metabolic disease: Secondary | ICD-10-CM | POA: Insufficient documentation

## 2017-08-22 DIAGNOSIS — E663 Overweight: Secondary | ICD-10-CM

## 2017-08-22 DIAGNOSIS — R5383 Other fatigue: Secondary | ICD-10-CM | POA: Diagnosis not present

## 2017-08-22 DIAGNOSIS — E119 Type 2 diabetes mellitus without complications: Secondary | ICD-10-CM | POA: Insufficient documentation

## 2017-08-22 DIAGNOSIS — E1169 Type 2 diabetes mellitus with other specified complication: Secondary | ICD-10-CM | POA: Insufficient documentation

## 2017-08-22 DIAGNOSIS — R29898 Other symptoms and signs involving the musculoskeletal system: Secondary | ICD-10-CM

## 2017-08-22 HISTORY — DX: Prediabetes: R73.03

## 2017-08-22 HISTORY — DX: Elevated blood-pressure reading, without diagnosis of hypertension: R03.0

## 2017-08-22 HISTORY — DX: Personal history of other endocrine, nutritional and metabolic disease: Z86.39

## 2017-08-22 HISTORY — DX: Mixed hyperlipidemia: E78.2

## 2017-08-22 NOTE — Patient Instructions (Addendum)
Risk factors for prediabetes and type 2 diabetes ° °Researchers don't fully understand why some people develop prediabetes and type 2 diabetes and others don't.  It's clear that certain factors increase the risk, however, including: ° °Weight. The more fatty tissue you have, the more resistant your cells become to insulin.  °Inactivity. The less active you are, the greater your risk. Physical activity helps you control your weight, uses up glucose as energy and makes your cells more sensitive to insulin.  °Family history. Your risk increases if a parent or sibling has type 2 diabetes.  °Race. Although it's unclear why, people of certain races -- including blacks, Hispanics, American Indians and Asian-Americans -- are at higher risk.  °Age. Your risk increases as you get older. This may be because you tend to exercise less, lose muscle mass and gain weight as you age. But type 2 diabetes is also increasing dramatically among children, adolescents and younger adults.  °Gestational diabetes. If you developed gestational diabetes when you were pregnant, your risk of developing prediabetes and type 2 diabetes later increases. If you gave birth to a baby weighing more than 9 pounds (4 kilograms), you're also at risk of type 2 diabetes.  °Polycystic ovary syndrome. For women, having polycystic ovary syndrome -- a common condition characterized by irregular menstrual periods, excess hair growth and obesity -- increases the risk of diabetes.  °High blood pressure. Having blood pressure over 140/90 millimeters of mercury (mm Hg) is linked to an increased risk of type 2 diabetes.  °Abnormal cholesterol and triglyceride levels. If you have low levels of high-density lipoprotein (HDL), or "good," cholesterol, your risk of type 2 diabetes is higher. Triglycerides are another type of fat carried in the blood. People with high levels of triglycerides have an increased risk of type 2 diabetes. Your doctor can let you know what your  cholesterol and triglyceride levels are. ° °A good guide to good carbs: The glycemic index °---If you have diabetes, or at risk for diabetes, you know all too well that when you eat carbohydrates, your blood sugar goes up. The total amount of carbs you consume at a meal or in a snack mostly determines what your blood sugar will do. But the food itself also plays a role. A serving of white rice has almost the same effect as eating pure table sugar -- a quick, high spike in blood sugar. A serving of lentils has a slower, smaller effect. ° °---Picking good sources of carbs can help you control your blood sugar and your weight. Even if you don't have diabetes, eating healthier carbohydrate-rich foods can help ward off a host of chronic conditions, from heart disease to various cancers to, well, diabetes. ° °---One way to choose foods is with the glycemic index (GI). This tool measures how much a food boosts blood sugar.  The glycemic index rates the effect of a specific amount of a food on blood sugar compared with the same amount of pure glucose. A food with a glycemic index of 28 boosts blood sugar only 28% as much as pure glucose. One with a GI of 95 acts like pure glucose. ° ° ° °High glycemic foods result in a quick spike in insulin and blood sugar (also known as blood glucose).  Low glycemic foods have a slower, smaller effect- these are healthier for you.  ° °Using the glycemic index °Using the glycemic index is easy: choose foods in the low GI category instead of those in the high   GI category (see below), and go easy on those in between. °Low glycemic index (GI of 55 or less): Most fruits and vegetables, beans, minimally processed grains, pasta, low-fat dairy foods, and nuts.  °Moderate glycemic index (GI 56 to 69): White and sweet potatoes, corn, white rice, couscous, breakfast cereals such as Cream of Wheat and Mini Wheats.  °High glycemic index (GI of 70 or higher): White bread, rice cakes, most crackers,  bagels, cakes, doughnuts, croissants, most packaged breakfast cereals. °You can see the values for 100 commons foods and get links to more at www.health.[employer].edu/glycemic. ° °Swaps for lowering glycemic index  °Instead of this high-glycemic index food Eat this lower-glycemic index food  °White rice Brown rice or converted rice  °Instant oatmeal Steel-cut oats  °Cornflakes Bran flakes  °Baked potato Pasta, bulgur  °White bread Whole-grain bread  °Corn Peas or leafy greens  ° ° ° ° ° °Prediabetes Eating Plan ° °Prediabetes--also called impaired glucose tolerance or impaired fasting glucose--is a condition that causes blood sugar (blood glucose) levels to be higher than normal. Following a healthy diet can help to keep prediabetes under control. It can also help to lower the risk of type 2 diabetes and heart disease, which are increased in people who have prediabetes. Along with regular exercise, a healthy diet: °· Promotes weight loss. °· Helps to control blood sugar levels. °· Helps to improve the way that the body uses insulin. ° ° °WHAT DO I NEED TO KNOW ABOUT THIS EATING PLAN? ° °· Use the glycemic index (GI) to plan your meals. The index tells you how quickly a food will raise your blood sugar. Choose low-GI foods. These foods take a longer time to raise blood sugar. °· Pay close attention to the amount of carbohydrates in the food that you eat. Carbohydrates increase blood sugar levels. °· Keep track of how many calories you take in. Eating the right amount of calories will help you to achieve a healthy weight. Losing about 7 percent of your starting weight can help to prevent type 2 diabetes. °· You may want to follow a Mediterranean diet. This diet includes a lot of vegetables, lean meats or fish, whole grains, fruits, and healthy oils and fats. ° ° °WHAT FOODS CAN I EAT? ° °Grains °Whole grains, such as whole-wheat or whole-grain breads, crackers, cereals, and pasta. Unsweetened oatmeal. Bulgur. Barley.  Quinoa. Brown rice. Corn or whole-wheat flour tortillas or taco shells. °Vegetables °Lettuce. Spinach. Peas. Beets. Cauliflower. Cabbage. Broccoli. Carrots. Tomatoes. Squash. Eggplant. Herbs. Peppers. Onions. Cucumbers. Brussels sprouts. °Fruits °Berries. Bananas. Apples. Oranges. Grapes. Papaya. Mango. Pomegranate. Kiwi. Grapefruit. Cherries. °Meats and Other Protein Sources °Seafood. Lean meats, such as chicken and turkey or lean cuts of pork and beef. Tofu. Eggs. Nuts. Beans. °Dairy °Low-fat or fat-free dairy products, such as yogurt, cottage cheese, and cheese. °Beverages °Water. Tea. Coffee. Sugar-free or diet soda. Seltzer water. Milk. Milk alternatives, such as soy or almond milk. °Condiments °Mustard. Relish. Low-fat, low-sugar ketchup. Low-fat, low-sugar barbecue sauce. Low-fat or fat-free mayonnaise. °Sweets and Desserts °Sugar-free or low-fat pudding. Sugar-free or low-fat ice cream and other frozen treats. °Fats and Oils °Avocado. Walnuts. Olive oil. °The items listed above may not be a complete list of recommended foods or beverages. Contact your dietitian for more options.  ° ° °WHAT FOODS ARE NOT RECOMMENDED? ° °Grains °Refined white flour and flour products, such as bread, pasta, snack foods, and cereals. °Beverages °Sweetened drinks, such as sweet iced tea and soda. °Sweets and Desserts °  Baked goods, such as cake, cupcakes, pastries, cookies, and cheesecake. The items listed above may not be a complete list of foods and beverages to avoid. Contact your dietitian for more information.   This information is not intended to replace advice given to you by your health care provider. Make sure you discuss any questions you have with your health care provider.   Document Released: 06/23/2014 Document Reviewed: 06/23/2014 Elsevier Interactive Patient Education 2016 [employer]   Guidelines for a Low Cholesterol, Low Saturated Fat Diet   Fats - Limit total intake of fats and oils. - Avoid  butter, stick margarine, shortening, lard, palm and coconut oils. - Limit mayonnaise, salad dressings, gravies and sauces, unless they are homemade with low-fat ingredients. - Limit chocolate. - Choose low-fat and nonfat products, such as low-fat mayonnaise, low-fat or non-hydrogenated peanut butter, low-fat or fat-free salad dressings and nonfat gravy. - Use vegetable oil, such as canola or olive oil. - Look for margarine that does not contain trans fatty acids. - Use nuts in moderate amounts. - Read ingredient labels carefully to determine both amount and type of fat present in foods. Limit saturated and trans fats! - Avoid high-fat processed and convenience foods.  Meats and Meat Alternatives - Choose fish, chicken, turkey and lean meats. - Use dried beans, peas, lentils and tofu. - Limit egg yolks to three to four per week. - If you eat red meat, limit to no more than three servings per week and choose loin or round cuts. - Avoid fatty meats, such as bacon, sausage, franks, luncheon meats and ribs. - Avoid all organ meats, including liver.  Dairy - Choose nonfat or low-fat milk, yogurt and cottage cheese. - Most cheeses are high in fat. Choose cheeses made from non-fat milk, such as mozzarella and ricotta cheese. - Choose light or fat-free cream cheese and sour cream. - Avoid cream and sauces made with cream.  Fruits and Vegetables - Eat a wide variety of fruits and vegetables. - Use lemon juice, vinegar or "mist" olive oil on vegetables. - Avoid adding sauces, fat or oil to vegetables.  Breads, Cereals and Grains - Choose whole-grain breads, cereals, pastas and rice. - Avoid high-fat snack foods, such as granola, cookies, pies, pastries, doughnuts and croissants.  Cooking Tips - Avoid deep fried foods. - Trim visible fat off meats and remove skin from poultry before cooking. - Bake, broil, boil, poach or roast poultry, fish and lean meats. - Drain and discard fat that drains  out of meat as you cook it. - Add little or no fat to foods. - Use vegetable oil sprays to grease pans for cooking or baking. - Steam vegetables. - Use herbs or no-oil marinades to flavor foods.  

## 2017-08-22 NOTE — Progress Notes (Signed)
Assessment and plan:  1. White coat syndrome without diagnosis of hypertension   2. Mixed hyperlipidemia   3. Prediabetes   4. Other fatigue   5. Weakness of both hips   6. Anxiousness   7. Overweight (BMI 25.0-29.9)   8. Parkinsonism, unspecified Parkinsonism type ([place])   9. History of vitamin D deficiency     Reviewed neurology note from [facility]; recently seen by Dennie Bible NP, Assessment & Plan attached: 83 year old with mild  Parkinson symptoms, however DaTscan is negative. She has history of hypertension right knee pain and spinal stenosis of lumbar region.  PLAN: Continue home exercise program at least daily F/U in 6 months to continue symptoms Dennie Bible, [place], [facility], APRN  1. Elevated BP in Office due to [employer] - Last visit, patient's blood pressure was elevated at 168/86.  Patient was asymptomatic at that time.  She was advised to check her blood pressure and pulse at home each day, keep a log, and bring to next OV.  Per ambulatory BP log, patient's systolic runs from 154 to 008, / diastolic runs 62 to 77.  2. Recent Labs (07/012019) Reviewed in Detail: - CBC = WNL.  - TSH/T4 = WNL.  - Liver Function  = WNL  - Kidney Function = WNL at 0.67. - BUN = 14.  Advised patient to drink about 85 oz of water per day.  3. Vitamin D = 69.1, WNL. - Continue Alive multivitamin; patient is on no additional Vitamin D supplementation. - Reviewed that idea range is 50-60.  - Will re-check in 4-6 months.  4. Prediabetes - HbA1c = 6.0, elevated. - Reviewed that she is now sensitive to sugars, carbohydrates, and sweets.  - Move more and be more aware of what you're eating.  - Counseled patient on pathophysiology of prediabetes, especially as an older person with older organs.  Discussed dietary and lifestyle modifications as first line.  Importance of low carb/ketogenic  diet discussed with patient in addition to regular exercise.   5. Cholesterol - Triglycerides = 150. - Decrease amount of carb intake to reduce this value.  Avoid fatty carbs.  - HDL = 74 - This is fantastic! - Reviewed that when this level is 64 or more, it helps protect against heart attack and stroke.  - LDL = 169. - Dietary changes such as low saturated & trans fat and low carb/ ketogenic diets discussed with patient.  Encouraged regular exercise and weight loss when appropriate.   - Increase exercise and physical activity to keep HDL up, and reduce LDL.  - Encouraged patient to talk to her cardiology specialist Dr. Wynonia Lawman about her cholesterol.  - Patient wishes to hold off on starting cholesterol medications for now.  She agrees to work on diet and exercise and re-check cholesterol level in 4-6 months.  6. BMI Counseling Explained to patient what BMI refers to, and what it means medically.    Told patient to think about it as a "medical risk stratification measurement" and how increasing BMI is associated with increasing risk/ or worsening state of various diseases such as hypertension, hyperlipidemia, diabetes, premature OA, depression etc.  American Heart Association guidelines for healthy diet, basically Mediterranean diet, and exercise guidelines of 30 minutes 5 days per week or more discussed in detail.  Health counseling performed.  All questions answered.  7. Lifestyle & Preventative Health Maintenance - Advised patient to continue working toward exercising  to improve overall health.    - Advised 30 minutes of activity, 5 days per week or more.  Recommended that the patient eventually strive for at least 150 minutes of moderate cardiovascular activity per week, as tolerated, according to guidelines established by the [facility].   - Encouraged patient to look into an exercise program such as [employer] to get involved in a social exercise environment.  - Healthy dietary  habits encouraged, including low-carb, and high amounts of lean protein in diet.   - Patient should also consume adequate amounts of water - half of body weight in oz of water per day.   Education and routine counseling performed. Handouts provided.  8. Follow-Up - Re-check Vitamin D in 4-6 months. - Re-check HbA1c in 4-6 months. - Re-check cholesterol in 4-6 months.  Will evaluate need for medication at that time.  - Return for next OV in 5 monhs.   Pt was in the office today for 32.5+ minutes, with over 50% time spent in face to face counseling of patients various medical conditions, treatment plans of those medical conditions including medicine management and lifestyle modification, strategies to improve health and well being; and in coordination of care. SEE ABOVE TREATMENT PLAN FOR DETAILS   Orders Placed This Encounter  Procedures  . Hemoglobin A1c  . Lipid panel  . VITAMIN D 25 Hydroxy (Vit-D Deficiency, Fractures)     Return for 5 months or so fasting blood work then OV with me 2-3 d after.   Anticipatory guidance and routine counseling done re: condition, txmnt options and need for follow up. All questions of patient's were answered.   Gross side effects, risk and benefits, and alternatives of medications discussed with patient.  Patient is aware that all medications have potential side effects and we are unable to predict every sideeffect or drug-drug interaction that may occur.  Expresses verbal understanding and consents to current therapy plan and treatment regiment.  Please see AVS handed out to patient at the end of our visit for additional patient instructions/ counseling done pertaining to today's office visit.  Note: This document was prepared using Dragon voice recognition software and may include unintentional dictation errors.    This document serves as a record of services personally performed by [name], DO. It was created on her behalf by  Toni Amend, a trained medical scribe. The creation of this record is based on the scribe's personal observations and the provider's statements to them.   I have reviewed the above medical documentation for accuracy and completeness and I concur.  [name] 09/05/17 6:03 PM   ----------------------------------------------------------------------------------------------------------------------  Subjective:   CC:   Jamie [name] is a 83 y.o. female who presents to [place] at [facility] today for review and discussion of recent bloodwork that was done.  1. All recent blood work that we ordered was reviewed with patient today.  Patient was counseled on all abnormalities and we discussed dietary and lifestyle changes that could help those values (also medications when appropriate).  Extensive health counseling performed and all patient's concerns/ questions were addressed.   Elevated Blood Pressure due to White Coat Last visit, patient's blood pressure was elevated at 168/86.  Patient was asymptomatic at that time.  She was advised to check her blood pressure and pulse at home each day, keep a log, and bring to next OV.  Per ambulatory BP log, patient's systolic runs from 009 to 381, / diastolic runs 62 to  77.  Patient is not taking losartan.  Today patient denies dizziness, denies lightheadedness.  States "I just don't have any energy" and "today I'm getting a little shaky."  Notes that she tries to drink enough water, but isn't sure how much she drinks.  Cholesterol Patient uses red yeast rice extract and omega-3 fatty acids to treat.  She was formerly on a cholesterol medication, discontinued for some reason.  She can't remember why she quit.  Vitamin D Patient notes that she hasn't had a renewed prescription for Vitamin D She started taking "Alive" multivitamins, and hasn't taken any extra Vitamin D in about two months.  Physical Activity Patient notes  that she's in the sun often during the day, but not when it's so hot.  States "I don't work in the garden when it's this hot," noting she only stays outside about half an hour on hot days.  Remarks that she has trouble motivating herself to go out and exercise, but if it's work, she feels more likely to do it (such as baling hay).  She vacuums and does other household chores often.  States that she feels tired when, for example, she goes to [employer] and sits down.  She states that she slumps into her chair, and later needs to lean on someone when she tries to take a stand again.  She went to physical therapy for this for two months.  Remarks she couldn't tell a difference afterwards.   Wt Readings from Last 3 Encounters:  08/22/17 169 lb 12.8 oz (77 kg)  08/16/17 171 lb 12.8 oz (77.9 kg)  06/27/17 170 lb 4.8 oz (77.2 kg)   BP Readings from Last 3 Encounters:  08/22/17 (!) 169/97  08/16/17 (!) 147/82  06/27/17 (!) 168/86   Pulse Readings from Last 3 Encounters:  08/22/17 76  08/16/17 71  06/27/17 82   BMI Readings from Last 3 Encounters:  08/22/17 27.41 kg/m  08/16/17 27.73 kg/m  06/27/17 27.49 kg/m     Patient Care Team    Relationship Specialty Notifications Start End  [name], DO PCP - General Family Medicine  06/27/17   [name], MD Consulting Physician Cardiology  06/27/17   Melvenia Beam, MD Consulting Physician Neurology  06/27/17   Sydnee Levans, MD Referring Physician Dermatology  06/27/17   Rehabilitation, [place]    06/27/17     Full medical history updated and reviewed in the office today  Patient Active Problem List   Diagnosis Date Noted  . Prediabetes 08/22/2017    Priority: High  . White coat syndrome without diagnosis of hypertension 08/22/2017    Priority: High  . Anxiousness 06/27/2017    Priority: High  . h/o Hypertension 08/14/2016    Priority: High  . h/o Hyperlipidemia     Priority: High  . Mixed hyperlipidemia  08/22/2017    Priority: Medium  . Other fatigue 06/27/2017    Priority: Medium  . Parkinsonian-like syndrome [facility])- sees Neuro- Dr Jaynee Eagles 03/13/2017    Priority: Medium  . History of vitamin D deficiency 08/22/2017  . Lumbar facet arthropathy 06/27/2017  . History of hysterectomy for benign disease 06/27/2017  . History of dizziness 06/27/2017  . Overweight (BMI 25.0-29.9) 06/27/2017  . Weakness of both hips 04/18/2017  . Abnormality of gait 04/18/2017  . Spinal stenosis of lumbar region 04/18/2017  . Pancolitis ([place]) 08/14/2016  . Leukocytes in urine 08/14/2016    Past Medical History:  Diagnosis Date  . Hypertension  only one time  . Right knee pain   . Spinal stenosis of lumbar region     Past Surgical History:  Procedure Laterality Date  . ABDOMINAL HYSTERECTOMY    . HYSTERECTOMY ABDOMINAL WITH SALPINGECTOMY      Social History   Tobacco Use  . Smoking status: Never Smoker  . Smokeless tobacco: Never Used  Substance Use Topics  . Alcohol use: No    Alcohol/week: 0.0 oz    Family Hx: Family History  Problem Relation Age of Onset  . Cancer Mother   . Cancer Father      Medications: Current Outpatient Medications  Medication Sig Dispense Refill  . aspirin 81 MG tablet Take 325 mg by mouth daily.     . Biotin 10000 MCG TABS Take by mouth. 1/2 tablet daily    . Coenzyme Q10 (COQ-10) 100 MG CAPS Take 1 tablet by mouth daily.    . Cyanocobalamin (VITAMIN B 12 PO) Take by mouth. 5, 000 mcg daily    . Multiple Vitamins-Minerals (VISION-VITE PRESERVE PO) Take 1 tablet by mouth daily. preservision    . Omega-3 Fatty Acids (OMEGA-3 FISH OIL PO) Take 1 tablet by mouth daily.    . Polyethylene Glycol 3350 (MIRALAX PO) Take by mouth daily.    . Red Yeast Rice Extract (RED YEAST RICE PO) Take by mouth. One tabl daily     No current facility-administered medications for this visit.     Allergies:  Allergies  Allergen Reactions  . Flagyl [Metronidazole] Rash     Possible cause of rash      Review of Systems: General:   No F/C, wt loss Pulm:   No DIB, SOB, pleuritic chest pain Card:  No CP, palpitations Abd:  No n/v/d or pain Ext:  No inc edema from baseline  Objective:  Blood pressure (!) 169/97, pulse 76, height 5\' 6"  (1.676 m), weight 169 lb 12.8 oz (77 kg), SpO2 98 %. Body mass index is 27.41 kg/m. Gen:   Well NAD, A and O *3 HEENT:    [place]/AT, EOMI,  MMM Lungs:   Normal work of breathing. CTA B/L, no Wh, rhonchi Heart:   RRR, S1, S2 WNL's, no MRG Abd:   No gross distention Exts:    warm, pink,  Brisk capillary refill, warm and well perfused.  Psych:    No HI/SI, judgement and insight good, Euthymic mood. Full Affect.   Recent Results (from the past 2160 hour(s))  VITAMIN D 25 Hydroxy (Vit-D Deficiency, Fractures)     Status: None   Collection Time: 08/20/17  8:46 AM  Result Value Ref Range   Vit D, 25-Hydroxy 69.1 30.0 - 100.0 ng/mL    Comment: Vitamin D deficiency has been defined by the [place] practice guideline as a level of serum 25-OH vitamin D less than 20 ng/mL (1,2). The Endocrine Society went on to further define vitamin D insufficiency as a level between 21 and 29 ng/mL (2). 1. IOM (Institute of Medicine). 2010. Dietary reference    intakes for calcium and D. [place]: The    [employer]. 2. Holick MF, [place], Bischoff-Ferrari HA, et al.    Evaluation, treatment, and prevention of vitamin D    deficiency: an Endocrine Society clinical practice    guideline. JCEM. 2011 Jul; 96(7):1911-30.   Hemoglobin A1c     Status: Abnormal   Collection Time: 08/20/17  8:46 AM  Result Value Ref Range  Hgb A1c MFr Bld 6.0 (H) 4.8 - 5.6 %    Comment:          Prediabetes: 5.7 - 6.4          Diabetes: >6.4          Glycemic control for adults with diabetes: <7.0    Est. average glucose Bld gHb Est-mCnc 126 mg/dL  T4, free     Status: None   Collection Time: 08/20/17   8:46 AM  Result Value Ref Range   Free T4 1.11 0.82 - 1.77 ng/dL  TSH     Status: None   Collection Time: 08/20/17  8:46 AM  Result Value Ref Range   TSH 3.880 0.450 - 4.500 uIU/mL  Lipid panel     Status: Abnormal   Collection Time: 08/20/17  8:46 AM  Result Value Ref Range   Cholesterol, Total 273 (H) 100 - 199 mg/dL   Triglycerides 150 (H) 0 - 149 mg/dL   HDL 74 >39 mg/dL   VLDL Cholesterol Cal 30 5 - 40 mg/dL   LDL Calculated 169 (H) 0 - 99 mg/dL   Chol/HDL Ratio 3.7 0.0 - 4.4 ratio    Comment:                                   T. Chol/HDL Ratio                                             Men  Women                               1/2 Avg.Risk  3.4    3.3                                   Avg.Risk  5.0    4.4                                2X Avg.Risk  9.6    7.1                                3X Avg.Risk 23.4   11.0   Comprehensive metabolic panel     Status: Abnormal   Collection Time: 08/20/17  8:46 AM  Result Value Ref Range   Glucose 103 (H) 65 - 99 mg/dL   BUN 14 8 - 27 mg/dL   Creatinine, Ser 0.67 0.57 - 1.00 mg/dL   GFR calc non Af Amer 83 >59 mL/min/1.73   GFR calc Af Amer 95 >59 mL/min/1.73   BUN/Creatinine Ratio 21 12 - 28   Sodium 143 134 - 144 mmol/L   Potassium 4.9 3.5 - 5.2 mmol/L   Chloride 106 96 - 106 mmol/L   CO2 25 20 - 29 mmol/L   Calcium 10.3 8.7 - 10.3 mg/dL   Total Protein 7.0 6.0 - 8.5 g/dL   Albumin 4.8 (H) 3.5 - 4.7 g/dL   Globulin, Total 2.2 1.5 - 4.5 g/dL   Albumin/Globulin Ratio 2.2 1.2 - 2.2   Bilirubin  Total 0.5 0.0 - 1.2 mg/dL   Alkaline Phosphatase 95 39 - 117 IU/L   AST 20 0 - 40 IU/L   ALT 14 0 - 32 IU/L  CBC with Differential/Platelet     Status: None   Collection Time: 08/20/17  8:46 AM  Result Value Ref Range   WBC 6.8 3.4 - 10.8 x10E3/uL   RBC 4.55 3.77 - 5.28 x10E6/uL   Hemoglobin 13.4 11.1 - 15.9 g/dL   Hematocrit 42.3 34.0 - 46.6 %   MCV 93 79 - 97 fL   MCH 29.5 26.6 - 33.0 pg   MCHC 31.7 31.5 - 35.7 g/dL   RDW 14.2  12.3 - 15.4 %   Platelets 217 150 - 450 x10E3/uL   Neutrophils 55 Not Estab. %   Lymphs 32 Not Estab. %   Monocytes 10 Not Estab. %   Eos 2 Not Estab. %   Basos 1 Not Estab. %   Neutrophils Absolute 3.8 1.4 - 7.0 x10E3/uL   Lymphocytes Absolute 2.2 0.7 - 3.1 x10E3/uL   Monocytes Absolute 0.7 0.1 - 0.9 x10E3/uL   EOS (ABSOLUTE) 0.2 0.0 - 0.4 x10E3/uL   Basophils Absolute 0.1 0.0 - 0.2 x10E3/uL   Immature Granulocytes 0 Not Estab. %   Immature Grans (Abs) 0.0 0.0 - 0.1 x10E3/uL

## 2017-09-18 DIAGNOSIS — M179 Osteoarthritis of knee, unspecified: Secondary | ICD-10-CM | POA: Insufficient documentation

## 2017-09-18 DIAGNOSIS — M1710 Unilateral primary osteoarthritis, unspecified knee: Secondary | ICD-10-CM | POA: Insufficient documentation

## 2017-09-18 HISTORY — DX: Osteoarthritis of knee, unspecified: M17.9

## 2017-09-18 HISTORY — DX: Unilateral primary osteoarthritis, unspecified knee: M17.10

## 2018-01-22 ENCOUNTER — Other Ambulatory Visit (INDEPENDENT_AMBULATORY_CARE_PROVIDER_SITE_OTHER): Payer: Medicare Other

## 2018-01-22 DIAGNOSIS — Z8639 Personal history of other endocrine, nutritional and metabolic disease: Secondary | ICD-10-CM

## 2018-01-22 DIAGNOSIS — E782 Mixed hyperlipidemia: Secondary | ICD-10-CM

## 2018-01-22 DIAGNOSIS — R7303 Prediabetes: Secondary | ICD-10-CM

## 2018-01-23 LAB — LIPID PANEL
CHOL/HDL RATIO: 3.2 ratio (ref 0.0–4.4)
Cholesterol, Total: 263 mg/dL — ABNORMAL HIGH (ref 100–199)
HDL: 81 mg/dL (ref >39)
LDL Calculated: 160 mg/dL — ABNORMAL HIGH (ref 0–99)
Triglycerides: 110 mg/dL (ref 0–149)
VLDL Cholesterol Cal: 22 mg/dL (ref 5–40)

## 2018-01-23 LAB — HEMOGLOBIN A1C
Est. average glucose Bld gHb Est-mCnc: 126 mg/dL
Hgb A1c MFr Bld: 6 % — ABNORMAL HIGH (ref 4.8–5.6)

## 2018-01-23 LAB — VITAMIN D 25 HYDROXY (VIT D DEFICIENCY, FRACTURES): Vit D, 25-Hydroxy: 51.9 ng/mL (ref 30.0–100.0)

## 2018-01-24 ENCOUNTER — Encounter: Payer: Self-pay | Admitting: Family Medicine

## 2018-01-24 ENCOUNTER — Ambulatory Visit (INDEPENDENT_AMBULATORY_CARE_PROVIDER_SITE_OTHER): Payer: Medicare Other | Admitting: Family Medicine

## 2018-01-24 VITALS — BP 140/80 | HR 67 | Temp 98.1°F | Ht 66.0 in | Wt 164.0 lb

## 2018-01-24 DIAGNOSIS — Z8639 Personal history of other endocrine, nutritional and metabolic disease: Secondary | ICD-10-CM

## 2018-01-24 DIAGNOSIS — M48061 Spinal stenosis, lumbar region without neurogenic claudication: Secondary | ICD-10-CM

## 2018-01-24 DIAGNOSIS — I10 Essential (primary) hypertension: Secondary | ICD-10-CM

## 2018-01-24 DIAGNOSIS — R29898 Other symptoms and signs involving the musculoskeletal system: Secondary | ICD-10-CM

## 2018-01-24 DIAGNOSIS — E785 Hyperlipidemia, unspecified: Secondary | ICD-10-CM | POA: Diagnosis not present

## 2018-01-24 DIAGNOSIS — R7303 Prediabetes: Secondary | ICD-10-CM | POA: Diagnosis not present

## 2018-01-24 DIAGNOSIS — R030 Elevated blood-pressure reading, without diagnosis of hypertension: Secondary | ICD-10-CM

## 2018-01-24 NOTE — Progress Notes (Signed)
Impression and Recommendations:    1. Essential hypertension- diet controlled   2. Hyperlipidemia, unspecified hyperlipidemia type- diet controlled   3. White coat syndrome without diagnosis of hypertension   4. Prediabetes   5. History of vitamin D deficiency   6. Spinal stenosis of lumbar region, unspecified whether neurogenic claudication present   7. Weakness of both hips      1. Elevated blood pressure -Blood pressure at 140/80 at this time.   2. Spinal stenosis -Advised the patient to ensure that she is using a cane or walker at home.   -Discussed that if the patient's spinal stenosis worsens, then she would need to follow up with her neurologist.  -Advised the patient to continue with her following up with Neurologist, Dr. Jaynee Eagles in January 2020.   3. Memory loss -She continues on B12 and Cognium to aid with her memory.   4. Vitamin D deficiency -Reviewed recent labs from 01/22/2018 with the patient and her daughter today -Advised that the patient continue taking her multi-vitamin.  -Will recheck in 1 year.   5. Elevated cholesterol -Reviewed recent labs from 01/22/2018 with the patient and her daughter today.  -HDL, LDL, and triglycerides have improved since patients last set of labs 5 months ago.  -Advised the patient to continue on Omega 3 vitamin and to increase her dose to 2 tablets of 500 mg daily.  -Advised that the patient walk at least 30 minutes 3 times a week.   Follow up in 6 months for an OV and 1 year for repeat labs.    Medications Discontinued During This Encounter  Medication Reason  . Multiple Vitamins-Minerals (VISION-VITE PRESERVE PO)   . Red Yeast Rice Extract (RED YEAST RICE PO)      Gross side effects, risk and benefits, and alternatives of medications and treatment plan in general discussed with patient.  Patient is aware that all medications have potential side effects and we are unable to predict every side effect or drug-drug  interaction that may occur.   Patient will call with any questions prior to using medication if they have concerns.    Expresses verbal understanding and consents to current therapy and treatment regimen.  No barriers to understanding were identified.  Red flag symptoms and signs discussed in detail.  Patient expressed understanding regarding what to do in case of emergency\urgent symptoms  Please see AVS handed out to patient at the end of our visit for further patient instructions/ counseling done pertaining to today's office visit.   Return in about 6 months (around 07/26/2018) for 2) BP, Chol, weakness/ spinal stenosis.     Note:  This note was prepared with assistance of Dragon voice recognition software. Occasional wrong-word or sound-a-like substitutions may have occurred due to the inherent limitations of voice recognition software.  This document serves as a record of services personally performed by [name], DO. It was created on her behalf by Steva Colder, a trained medical scribe. The creation of this record is based on the scribe's personal observations and the provider's statements to them.   I have reviewed the above medical documentation for accuracy and completeness and I concur.  [name], DO   ------------------------------------------------------------------------------------------------------------------------------------------------------------------------------------------------------------------------------    Subjective:     HPI: Jamie [name] is a 83 y.o. female who presents to [place] at [facility] today for issues as discussed below. She presents today with her daughter. She doesn't have a cane or walker  that she uses.  Elevated blood pressure:  She hasn't been checking her blood pressure at home. She feels okay at this time.   -For the past week from her knees to her ankles, she has had a burning sensation that is similar to  hot flashes and it only happens at night. She has a hx of spinal stenosis. She wears a pad all the time due to urinary incontinence. She denies cauda equina symptoms. Dr. Gladstone Lighter is her orthopedist at [facility]. She has a follow up with Neurologist, Dr. Jaynee Eagles in January 2020.     Pre-DM:  -A1c at 6.0 on 01/22/2018.    Wt Readings from Last 3 Encounters:  01/24/18 164 lb (74.4 kg)  08/22/17 169 lb 12.8 oz (77 kg)  08/16/17 171 lb 12.8 oz (77.9 kg)   BP Readings from Last 3 Encounters:  01/24/18 140/80  08/22/17 (!) 169/97  08/16/17 (!) 147/82   Pulse Readings from Last 3 Encounters:  01/24/18 67  08/22/17 76  08/16/17 71   BMI Readings from Last 3 Encounters:  01/24/18 26.47 kg/m  08/22/17 27.41 kg/m  08/16/17 27.73 kg/m     Patient Care Team    Relationship Specialty Notifications Start End  [name], DO PCP - General Family Medicine  06/27/17   [name], MD Consulting Physician Cardiology  06/27/17   Melvenia Beam, MD Consulting Physician Neurology  06/27/17   Sydnee Levans, MD Referring Physician Dermatology  06/27/17   Rehabilitation, [place]    06/27/17      Patient Active Problem List   Diagnosis Date Noted  . Prediabetes 08/22/2017    Priority: High  . White coat syndrome without diagnosis of hypertension 08/22/2017    Priority: High  . Anxiousness 06/27/2017    Priority: High  . h/o Hypertension 08/14/2016    Priority: High  . h/o Hyperlipidemia     Priority: High  . Mixed hyperlipidemia 08/22/2017    Priority: Medium  . Other fatigue 06/27/2017    Priority: Medium  . Parkinsonian-like syndrome [facility])- sees Neuro- Dr Jaynee Eagles 03/13/2017    Priority: Medium  . Osteoarthritis of knee 09/18/2017  . History of vitamin D deficiency 08/22/2017  . Lumbar facet arthropathy 06/27/2017  . History of hysterectomy for benign disease 06/27/2017  . History of dizziness 06/27/2017  . Overweight (BMI 25.0-29.9) 06/27/2017  . Weakness of both  hips 04/18/2017  . Abnormality of gait 04/18/2017  . Spinal stenosis of lumbar region 04/18/2017  . Pancolitis ([place]) 08/14/2016  . Leukocytes in urine 08/14/2016    Past Medical history, Surgical history, Family history, Social history, Allergies and Medications have been entered into the medical record, reviewed and changed as needed.    Current Meds  Medication Sig  . aspirin 81 MG tablet Take 325 mg by mouth daily.   . Biotin 10000 MCG TABS Take by mouth. 1/2 tablet daily  . Coenzyme Q10 (COQ-10) 100 MG CAPS Take 1 tablet by mouth daily.  . Cyanocobalamin (VITAMIN B 12 PO) Take by mouth. 5, 000 mcg daily  . Omega-3 Fatty Acids (OMEGA-3 FISH OIL PO) Take 1 tablet by mouth daily.  . Polyethylene Glycol 3350 (MIRALAX PO) Take by mouth daily.  Marland Kitchen UNABLE TO FIND Take 2 tablets by mouth daily. Med Name: Cognium    Allergies:  Allergies  Allergen Reactions  . Flagyl [Metronidazole] Rash    Possible cause of rash      Review of Systems:  A fourteen system review of  systems was performed and found to be positive as per HPI.   Objective:   Blood pressure 140/80, pulse 67, temperature 98.1 F (36.7 C), height 5\' 6"  (1.676 m), weight 164 lb (74.4 kg), SpO2 99 %. Body mass index is 26.47 kg/m. General:  Well Developed, well nourished, appropriate for stated age.  Neuro:  Alert and oriented,  extra-ocular muscles intact  HEENT:  Normocephalic, atraumatic, neck supple, no carotid bruits appreciated  Skin:  no gross rash, warm, pink. Cardiac:  RRR, S1 S2 Respiratory:  ECTA B/L and A/P, Not using accessory muscles, speaking in full sentences- unlabored. Vascular:  Ext warm, no cyanosis apprec.; cap RF less 2 sec. Psych:  No HI/SI, judgement and insight good, Euthymic mood. Full Affect.

## 2018-01-24 NOTE — Patient Instructions (Addendum)
Please take 2 tablets of your Omega 3 Fatty Acids daily.   -Please make sure your are taking at least 1200 to 1500 IUs of vitamin D daily  -Please monitor blood pressure and if running higher than 140/90 or less, please come in sooner than planned.  -We will recheck full set of labs in 1 year otherwise I will see you in 6 months

## 2018-03-08 ENCOUNTER — Ambulatory Visit: Payer: Medicare Other | Admitting: Nurse Practitioner

## 2018-03-11 ENCOUNTER — Ambulatory Visit: Payer: Medicare Other | Admitting: Family Medicine

## 2018-03-12 ENCOUNTER — Ambulatory Visit (INDEPENDENT_AMBULATORY_CARE_PROVIDER_SITE_OTHER): Payer: Medicare Other | Admitting: Family Medicine

## 2018-03-12 ENCOUNTER — Encounter: Payer: Self-pay | Admitting: Family Medicine

## 2018-03-12 VITALS — BP 138/83 | HR 68 | Temp 98.0°F | Ht 66.0 in | Wt 163.0 lb

## 2018-03-12 DIAGNOSIS — M25561 Pain in right knee: Secondary | ICD-10-CM | POA: Diagnosis not present

## 2018-03-12 DIAGNOSIS — M25569 Pain in unspecified knee: Secondary | ICD-10-CM | POA: Insufficient documentation

## 2018-03-12 NOTE — Patient Instructions (Signed)
Ice 15 to 20 minutes 3-4 or 5 times daily.  More often is better not longer.  Avoid any deep bending, squatting or kneeling.  Try to avoid stairs and use the good leg to go up each stair as I showed you in the office.  Please call Dorothea Ogle at our front desk if you have any questions about the referral to the sports medicine doctor for procedure

## 2018-03-12 NOTE — Progress Notes (Signed)
Impression and Recommendations:    1. Acute pain of right knee   2. Recurrent pain of right knee     1. - PT referral if pt agreed- which she declined.  Discussed the risks benefits of this with patient and importance of strengthening muscles around the knee area regardless of pathology. 2. After R/B and details discussed with pt and daughter- they desire to go to sprts med for this.   Since daughter seen by Dr Tamala Julian - they request referral referral to him.      Discussed need for ultrasound guided joint aspiration followed by possible steroid injection with patient and her family today. They desire US guided vs me doing it here in office. 3. Discussed that the patient will need to avoid bending, squatting, and try to avoid stairs. Discussed that if she needs to go up stairs, then she will go up the stairs first with her good knee and then have her injured knee follow.   4. - NSAIDS when necessary; risk benefits discussed with patient. 5. - ICE for 15-20 minutes, 3-4 times per day. 6. - pt understands may need MRI if knee becomes unstable/ does not improve as expected 7. - Pt will make follow-up appointment for complete physical exam with fasting blood work in the near future at [facility].  Education and routine counseling performed. Handouts provided.   Orders Placed This Encounter  Procedures  . Ambulatory referral to Sports Medicine    The patient was counselled, risk factors were discussed, anticipatory guidance given.   Return for F-up of current med issues as previously d/c pt.  Please see AVS handed out to patient at the end of our visit for further patient instructions/ counseling done pertaining to today's office visit.  Note:  This document was prepared using Dragon voice recognition software and may include unintentional dictation errors.  This document serves as a record of services personally performed by [name], DO. It was created on her  behalf by Steva Colder, a trained medical scribe. The creation of this record is based on the scribe's personal observations and the provider's statements to them.   I have reviewed the above medical documentation for accuracy and completeness and I concur.  [name], DO 03/17/2018 7:06 PM        Subjective: Jamie [name] is a 84 y.o. female who sustained a right knee injury 10 day(s) ago. Mechanism of injury: She twisted her right knee when she was trying to get into a booth, she denies her knee buckling initially. Immediate symptoms: immediate pain to posterior knee, immediate swelling. She was able to walk initially and notes that her right knee causes her issues when she has been sitting for long periods of time. Symptoms have been constant since that time, however, they started to improve. Prior history of related problems: no prior problems with this area in the past. On yesterday, her knee felt better and she started to clean her home more and notes that today she has had increased right knee pain. She was seen by Urgent Care on 03/04/2018 and was diagnosed with a baker's cyst, she iced her right knee once daily, and wrapped her knee. She was advised to go visit an Orthopedic clinic.    Patient Care Team    Relationship Specialty Notifications Start End  [name], DO PCP - General Family Medicine  06/27/17   [name], MD Consulting Physician Cardiology  06/27/17  Melvenia Beam, MD Consulting Physician Neurology  06/27/17   Sydnee Levans, MD Referring Physician Dermatology  06/27/17   Rehabilitation, [place]    06/27/17      Past Medical History:  Diagnosis Date  . Hypertension    only one time  . Right knee pain   . Spinal stenosis of lumbar region     Past Surgical History:  Procedure Laterality Date  . ABDOMINAL HYSTERECTOMY    . HYSTERECTOMY ABDOMINAL WITH SALPINGECTOMY      @HXFAM @  Social History   Substance and Sexual Activity  Drug  Use No  ,  Social History   Substance and Sexual Activity  Alcohol Use No  . Alcohol/week: 0.0 standard drinks  ,  Social History   Tobacco Use  Smoking Status Never Smoker  Smokeless Tobacco Never Used  ,  Social History   Substance and Sexual Activity  Sexual Activity Not Currently    Patient's Medications  New Prescriptions   No medications on file  Previous Medications   ASPIRIN 81 MG TABLET    Take 325 mg by mouth daily.    BIOTIN 58099 MCG TABS    Take by mouth. 1/2 tablet daily   COENZYME Q10 (COQ-10) 100 MG CAPS    Take 1 tablet by mouth daily.   CYANOCOBALAMIN (VITAMIN B 12 PO)    Take by mouth. 5, 000 mcg daily   OMEGA-3 FATTY ACIDS (OMEGA-3 FISH OIL PO)    Take 1 tablet by mouth daily.   POLYETHYLENE GLYCOL 3350 (MIRALAX PO)    Take by mouth daily.   UNABLE TO FIND    Take 2 tablets by mouth daily. Med Name: Cognium  Modified Medications   No medications on file  Discontinued Medications   No medications on file    Flagyl [metronidazole]  Scheduled Meds: Continuous Infusions: PRN Meds:.  Review of Systems: General:   Denies fever, chills, unexplained weight loss.  Optho/Auditory:   Denies visual changes, blurred vision/LOV Respiratory:   Denies SOB, DOE more than baseline levels.   Cardiovascular:   Denies chest pain, palpitations, new onset peripheral edema  Gastrointestinal:   Denies nausea, vomiting, diarrhea.  Genitourinary: Denies dysuria, freq/ urgency, flank pain or discharge from genitals.  Endocrine:     Denies hot or cold intolerance, polyuria, polydipsia. Musculoskeletal:   See above in HPI  Skin:  Denies rash, suspicious lesions Neurological:     Denies dizziness, unexplained weakness, numbness  Psychiatric/Behavioral:   Denies mood changes, suicidal or homicidal ideations, hallucinations   Objective:   Pulse 68, temperature 98 F (36.7 C), height 5\' 6"  (1.676 m), weight 163 lb (73.9 kg), SpO2 98 %. Body mass index is 26.31  kg/m. General: Well Developed, well nourished, and in no acute distress.  Neuro: Alert and oriented x3, extra-ocular muscles intact, sensation grossly intact.  HEENT: Normocephalic, atraumatic, pupils equal round reactive to light, neck supple Skin: no gross suspicious lesions or rashes  Cardiac: Regular rate and rhythm, no murmurs rubs or gallops.  Respiratory: Essentially clear to auscultation bilaterally. Not using accessory muscles, speaking in full sentences.  Abdominal: Soft, not grossly distended Musculoskeletal: Ambulates w/o diff, FROM * 4 ext.  Knee:  Scant amount peripatellar swelling, slight increased warmth that is mainly anteriorly. Otherwise, essential normal exam; no gross instability to posterior or anterior drawer test; negative varus stress test; discomfort on valgus stress test. Mild laxity noted .  FROM without crepitus, mild soft tissue swelling posterior knee, no ecchymosis,  normal contralateral knee exam. Neurovascularly intact distally. No pain upon quick palpation of the ipsilateral hip and ankle joints. Vasc: less 2 sec cap RF, warm and pink  Psych: No HI/SI, judgement and insight good, Euthymic mood. Full Affect.

## 2018-03-18 NOTE — Progress Notes (Signed)
[name] [address] [place], [place] [address] Phone: [phone] Subjective:    I Jamie [name] am serving as a Education administrator for Dr. Hulan Saas.   I'm seeing this patient by the request  of:    CC: Right knee pain  EGB:[id number]  JOHNNETTE [name] is a 84 y.o. female coming in with complaint of right knee pain. Twisted her knee on Jan. 11th. Hamstring pain as well.   Onset- 1 year  Location- posterior knee, hamstring  Character- Sharp  Aggravating factors- siting in low chairs  Reliving factors- Ice  Therapies tried-  Severity-3 out of 10 at the moment.     Past Medical History:  Diagnosis Date  . Hypertension    only one time  . Right knee pain   . Spinal stenosis of lumbar region    Past Surgical History:  Procedure Laterality Date  . ABDOMINAL HYSTERECTOMY    . HYSTERECTOMY ABDOMINAL WITH SALPINGECTOMY     Social History   Socioeconomic History  . Marital status: Widowed    Spouse name: Not on file  . Number of children: Not on file  . Years of education: Not on file  . Highest education level: Not on file  Occupational History  . Not on file  Social Needs  . Financial resource strain: Not on file  . Food insecurity:    Worry: Not on file    Inability: Not on file  . Transportation needs:    Medical: Not on file    Non-medical: Not on file  Tobacco Use  . Smoking status: Never Smoker  . Smokeless tobacco: Never Used  Substance and Sexual Activity  . Alcohol use: No    Alcohol/week: 0.0 standard drinks  . Drug use: No  . Sexual activity: Not Currently  Lifestyle  . Physical activity:    Days per week: Not on file    Minutes per session: Not on file  . Stress: Not on file  Relationships  . Social connections:    Talks on phone: Not on file    Gets together: Not on file    Attends religious service: Not on file    Active member of club or organization: Not on file    Attends meetings of clubs or organizations:  Not on file    Relationship status: Not on file  Other Topics Concern  . Not on file  Social History Narrative  . Not on file   Allergies  Allergen Reactions  . Flagyl [Metronidazole] Rash    Possible cause of rash    Family History  Problem Relation Age of Onset  . Cancer Mother   . Cancer Father        Current Outpatient Medications (Analgesics):  .  aspirin 81 MG tablet, Take 325 mg by mouth daily.   Current Outpatient Medications (Hematological):  Marland Kitchen  Cyanocobalamin (VITAMIN B 12 PO), Take by mouth. 5, 000 mcg daily  Current Outpatient Medications (Other):  .  Biotin 10000 MCG TABS, Take by mouth. 1/2 tablet daily .  Coenzyme Q10 (COQ-10) 100 MG CAPS, Take 1 tablet by mouth daily. .  Omega-3 Fatty Acids (OMEGA-3 FISH OIL PO), Take 1 tablet by mouth daily. .  Polyethylene Glycol 3350 (MIRALAX PO), Take by mouth daily. Marland Kitchen  UNABLE TO FIND, Take 2 tablets by mouth daily. Med Name: Cognium    Past medical history, social, surgical and family history all reviewed in electronic medical record.  No  pertanent information unless stated regarding to the chief complaint.   Review of Systems:  No headache, visual changes, nausea, vomiting, diarrhea, constipation, dizziness, abdominal pain, skin rash, fevers, chills, night sweats, weight loss, swollen lymph nodes, body aches, joint swelling,  chest pain, shortness of breath, mood changes.  Positive muscle aches  Objective  Blood pressure 120/90, pulse 67, height 5\' 6"  (1.676 m), weight 164 lb (74.4 kg), SpO2 93 %.    General: No apparent distress alert and oriented x3 mood and affect normal, dressed appropriately.  Mild masked face ease HEENT: Pupils equal, extraocular movements intact  Respiratory: Patient's speak in full sentences and does not appear short of breath  Cardiovascular: Trace lower extremity edema, non tender, no erythema  Skin: Warm dry intact with no signs of infection or rash on extremities or on axial skeleton.   Abdomen: Soft nontender  Neuro: Cranial nerves II through XII are intact, neurovascularly intact in all extremities with 2+ DTRs and 2+ pulses.  Lymph: No lymphadenopathy of posterior or anterior cervical chain or axillae bilaterally.  Gait shuffling gait MSK:  tender with mild limited range of motion and good stability and symmetric strength and tone of shoulders, elbows, wrist, hip and ankles bilaterally.  Mild hypertonicity noted but no true cogwheeling Knee: Right valgus deformity noted.  Abnormal thigh to calf ratio.  Tender to palpation over medial and PF joint line.  Lacks last 10 degrees of flexion and lacks 2 degrees of extension instability with valgus force.  painful patellar compression. Patellar glide with moderate crepitus. Patellar and quadriceps tendons unremarkable. Hamstring and quadriceps strength is normal. Contralateral knee shows mild arthritic changes as well but no significant instability  [id number]; 15 additional minutes spent for Therapeutic exercises as stated in above notes.  This included exercises focusing on stretching, strengthening, with significant focus on eccentric aspects.   Long term goals include an improvement in range of motion, strength, endurance as well as avoiding reinjury. Patient's frequency would include in 1-2 times a day, 3-5 times a week for a duration of 6-12 weeks.  Proper technique shown and discussed handout in great detail with ATC.  All questions were discussed and answered.     Impression and Recommendations:     This case required medical decision making of moderate complexity. The above documentation has been reviewed and is accurate and complete Jamie Pulley, DO       Note: This dictation was prepared with Dragon dictation along with smaller phrase technology. Any transcriptional errors that result from this process are unintentional.

## 2018-03-19 ENCOUNTER — Ambulatory Visit (INDEPENDENT_AMBULATORY_CARE_PROVIDER_SITE_OTHER)
Admission: RE | Admit: 2018-03-19 | Discharge: 2018-03-19 | Disposition: A | Discharge status: Home / Self Care | Payer: Medicare Other | Source: Ambulatory Visit | Attending: Family Medicine | Admitting: Family Medicine

## 2018-03-19 ENCOUNTER — Encounter: Payer: Self-pay | Admitting: Family Medicine

## 2018-03-19 ENCOUNTER — Ambulatory Visit: Payer: Medicare Other | Admitting: Family Medicine

## 2018-03-19 VITALS — BP 120/90 | HR 67 | Ht 66.0 in | Wt 164.0 lb

## 2018-03-19 DIAGNOSIS — M25561 Pain in right knee: Secondary | ICD-10-CM

## 2018-03-19 DIAGNOSIS — G8929 Other chronic pain: Secondary | ICD-10-CM

## 2018-03-19 DIAGNOSIS — M170 Bilateral primary osteoarthritis of knee: Secondary | ICD-10-CM | POA: Insufficient documentation

## 2018-03-19 DIAGNOSIS — M1711 Unilateral primary osteoarthritis, right knee: Secondary | ICD-10-CM

## 2018-03-19 DIAGNOSIS — G20 Parkinson's disease: Secondary | ICD-10-CM

## 2018-03-19 HISTORY — DX: Unilateral primary osteoarthritis, right knee: M17.11

## 2018-03-19 NOTE — Patient Instructions (Addendum)
Great to meet you  Xray downstairs Ice 20 minutes 2 times daily. Usually after activity and before bed. Exercises 3 times a week.  We will try everything naturally first  Vitamin D 2000 IU daily  Turmeric 500mg  daily  Tart cherry extract any dose at night Stay active See me again in 4ish weeks and make sure you are doing well

## 2018-03-19 NOTE — Assessment & Plan Note (Signed)
Right knee arthritis, pain in the calf, discussed icing regimen and home exercise, discussed which activities to do which activities to avoid.  Patient avoided any type of injection today.  Patient declined any type of custom bracing even though patient does have an abnormal thigh to calf ratio and some mild instability in the knee.  Patient will slowly increase activities and follow-up with me again in 4 to 6 weeks

## 2018-03-19 NOTE — Assessment & Plan Note (Signed)
Patient does have significant stiffness noted.  Most recent imaging though did not show signs that are consistent with Parkinson's.  Being followed by neurologist and could be contributing to some of the discomfort and pain and tightness of the hamstrings.  Patient also has facet arthropathy and we will continue to monitor that as well.

## 2018-04-18 ENCOUNTER — Ambulatory Visit: Payer: Medicare Other | Admitting: Family Medicine

## 2018-04-18 ENCOUNTER — Encounter: Payer: Self-pay | Admitting: Family Medicine

## 2018-04-18 VITALS — BP 128/88 | HR 76 | Ht 66.0 in | Wt 162.0 lb

## 2018-04-18 DIAGNOSIS — M1711 Unilateral primary osteoarthritis, right knee: Secondary | ICD-10-CM | POA: Diagnosis not present

## 2018-04-18 DIAGNOSIS — R2689 Other abnormalities of gait and mobility: Secondary | ICD-10-CM

## 2018-04-18 DIAGNOSIS — R278 Other lack of coordination: Secondary | ICD-10-CM | POA: Diagnosis not present

## 2018-04-18 DIAGNOSIS — F419 Anxiety disorder, unspecified: Secondary | ICD-10-CM

## 2018-04-18 DIAGNOSIS — M48061 Spinal stenosis, lumbar region without neurogenic claudication: Secondary | ICD-10-CM

## 2018-04-18 NOTE — Patient Instructions (Signed)
Good to see you  PT will be calling you  Tried injection in the knee and I hope it helps Stay active when you can  See me again in 4-6 weeks and we may need a gel injection in the knee

## 2018-04-18 NOTE — Assessment & Plan Note (Signed)
Patient given injection.  Patient has significant arthritic changes that I do believe some underlying dementia that is contributing at this moment.  I do think that there is some underlying spinal stenosis that is causing some mild weakness of the legs but I also believe that patient has deconditioning secondary to her being more immobilized secondary to her underlying anxiety.  We attempted the injection today that will hopefully be beneficial.  We discussed the possibility of viscosupplementation.  Encourage patient to try physical therapy for more balance and coordination and with son at her side to have agreed to try this again.  Patient will follow-up with me again in 4 to 6 weeks

## 2018-04-18 NOTE — Progress Notes (Signed)
[name] [address] [place], [place] [address] Phone: [phone] Subjective:   [name], am serving as a scribe for Dr. Hulan Saas.   CC: Knee pain follow-up  YJE:[id number]   03/19/2018 Right knee arthritis, pain in the calf, discussed icing regimen and home exercise, discussed which activities to do which activities to avoid.  Patient avoided any type of injection today.  Patient declined any type of custom bracing even though patient does have an abnormal thigh to calf ratio and some mild instability in the knee.  Patient will slowly increase activities and follow-up with me again in 4 to 6 weeks  Update 04/18/2018: Jamie [name] is a 84 y.o. female coming in with complaint of right knee pain. Patient states her knee pain is the same as last visit. Pain with deep knee bending. Does mention that she had some hamstring pain and since we last saw her.  Patient states that unfortunately is feeling more insecure with getting in and out of chairs.   Patient did have x-rays taken March 19, 2018.  Found to have mild chondrocalcinosis as well as moderate tricompartment degenerative changes  Past Medical History:  Diagnosis Date  . Hypertension    only one time  . Right knee pain   . Spinal stenosis of lumbar region    Past Surgical History:  Procedure Laterality Date  . ABDOMINAL HYSTERECTOMY    . HYSTERECTOMY ABDOMINAL WITH SALPINGECTOMY     Social History   Socioeconomic History  . Marital status: Widowed    Spouse name: Not on file  . Number of children: Not on file  . Years of education: Not on file  . Highest education level: Not on file  Occupational History  . Not on file  Social Needs  . Financial resource strain: Not on file  . Food insecurity:    Worry: Not on file    Inability: Not on file  . Transportation needs:    Medical: Not on file    Non-medical: Not on file  Tobacco Use  . Smoking status: Never Smoker  .  Smokeless tobacco: Never Used  Substance and Sexual Activity  . Alcohol use: No    Alcohol/week: 0.0 standard drinks  . Drug use: No  . Sexual activity: Not Currently  Lifestyle  . Physical activity:    Days per week: Not on file    Minutes per session: Not on file  . Stress: Not on file  Relationships  . Social connections:    Talks on phone: Not on file    Gets together: Not on file    Attends religious service: Not on file    Active member of club or organization: Not on file    Attends meetings of clubs or organizations: Not on file    Relationship status: Not on file  Other Topics Concern  . Not on file  Social History Narrative  . Not on file   Allergies  Allergen Reactions  . Flagyl [Metronidazole] Rash    Possible cause of rash    Family History  Problem Relation Age of Onset  . Cancer Mother   . Cancer Father        Current Outpatient Medications (Analgesics):  .  aspirin 81 MG tablet, Take 325 mg by mouth daily.   Current Outpatient Medications (Hematological):  Marland Kitchen  Cyanocobalamin (VITAMIN B 12 PO), Take by mouth. 5, 000 mcg daily  Current Outpatient Medications (Other):  .  Biotin 10000 MCG TABS, Take by mouth. 1/2 tablet daily .  Coenzyme Q10 (COQ-10) 100 MG CAPS, Take 1 tablet by mouth daily. .  Omega-3 Fatty Acids (OMEGA-3 FISH OIL PO), Take 1 tablet by mouth daily. .  Polyethylene Glycol 3350 (MIRALAX PO), Take by mouth daily. Marland Kitchen  UNABLE TO FIND, Take 2 tablets by mouth daily. Med Name: Cognium    Past medical history, social, surgical and family history all reviewed in electronic medical record.  No pertanent information unless stated regarding to the chief complaint.   Review of Systems:  No headache, visual changes, nausea, vomiting, diarrhea, constipation, dizziness, abdominal pain, skin rash, fevers, chills, night sweats, weight loss, swollen lymph nodes,, chest pain, shortness of breath, mood changes.  Positive muscle aches, body weakness,  joint swelling  Objective  Blood pressure 128/88, pulse 76, height 5\' 6"  (1.676 m), weight 162 lb (73.5 kg), SpO2 98 %.   General: No apparent distress alert but not completely oriented  HEENT: Pupils equal, extraocular movements intact  Respiratory: Patient's speak in full sentences and does not appear short of breath  Cardiovascular: No lower extremity edema, non tender, no erythema  Skin: Warm dry intact with no signs of infection or rash on extremities or on axial skeleton.  Abdomen: Soft nontender  Neuro: Cranial nerves II through XII are intact, neurovascularly intact in all extremities with 2+ DTRs and 2+ pulses.  Lymph: No lymphadenopathy of posterior or anterior cervical chain or axillae bilaterally.  Significant loss of lordosis of the lumbar spine Gait antalgic MSK:  tender with limited range of motion and good stability and symmetric strength and hypertonicity of shoulders, elbows, wrist, hip and ankles bilaterally.  Knee: Right valgus deformity noted.  Abnormal thigh to calf ratio.  Tender to palpation over medial and PF joint line.  ROM full in flexion and extension and lower leg rotation. instability with valgus force.  painful patellar compression. Patellar glide with moderate crepitus. Patellar and quadriceps tendons unremarkable. Hamstring and quadriceps strength is normal. Contralateral knee shows arthritic changes  After informed written and verbal consent, patient was seated on exam table. Right knee was prepped with alcohol swab and utilizing anterolateral approach, patient's right knee space was injected with 4:1  marcaine 0.5%: Kenalog 40mg /dL. Patient tolerated the procedure well without immediate complications.    Impression and Recommendations:     This case required medical decision making of moderate complexity. The above documentation has been reviewed and is accurate and complete Lyndal Pulley, DO       Note: This dictation was prepared with  Dragon dictation along with smaller phrase technology. Any transcriptional errors that result from this process are unintentional.

## 2018-04-25 ENCOUNTER — Telehealth: Payer: Self-pay | Admitting: Family Medicine

## 2018-04-25 NOTE — Telephone Encounter (Signed)
Called and spoke to [place] she states that ortho has started seeing her for the hip/leg pain that Loni Beckwith, NP was seeing her for and they have set of PT for the patient.  I advised that they contact Dr. Tamala Julian (ortho) and verify that neuro is not needed and that they are now managing what the patient needs for this condition. MPulliam, CMA/RT(R)

## 2018-04-25 NOTE — Telephone Encounter (Signed)
Patient's daughter-in -law/ [name] called, request provider advise them on whether or Not to keep (Neuro appts for 3/13 & 3/17) --- She states Orthopedic specialist has referred them to Neuro Rehab  & they were unaware of 3/13 appt  (only knew about the 3/17 appt)   ---Forwarding message to medical assistant for review w/ Dr. Jenetta Downer and to call pt's family back with decision @ [phone].  --glh

## 2018-05-01 ENCOUNTER — Telehealth: Payer: Self-pay

## 2018-05-01 NOTE — Telephone Encounter (Signed)
Dr. Tamala Julian per a verbal recommends that patient keep appointment with neurology. [name] aware of recommendation.

## 2018-05-01 NOTE — Telephone Encounter (Signed)
Copied from [place] [id number]. Topic: General - Other >> May 01, 2018  1:07 PM Yvette Rack wrote: Reason for CRM: Patient's daughter-in -law/ [name] called in for an update on whether or not pt should keep Neuro appt on 3/17. [name] requests call back. Cb# [phone]

## 2018-05-03 ENCOUNTER — Ambulatory Visit: Payer: Medicare Other | Admitting: Physical Therapy

## 2018-05-06 NOTE — Progress Notes (Signed)
[facility]  PATIENT: Jamie [name] DOB: Dec 28, 1934   REASON FOR VISIT: Follow-up for difficulty walking, DAT scan negative for Parkinson's disease, history of spinal stenosis, right knee pain HISTORY FROM: Patient and daughter in law Golf 3/17/2020CM [name], 84 year old female returns for follow-up with history of gait abnormality spinal stenosis and right knee pain.  She had recent injection to her right knee by her primary care with benefit.  She is due to start some physical therapy later this week for her gait disorder.  She continues to try to be as active as possible.  She continues to drive short distances to church, grocery store etc.  She denies any recent falls.  She only uses a walker at night to get up to go to the bathroom.  She continues to follow her home exercise program.  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   UPDATE 6/27/2019CM [name], 84 year old female returns for follow-up with history of walking difficulty.  DaTscan was negative for  Parkinson's disease.  She also has a history of right knee pain right foot pain spinal stenosis lumbar pain degenerative right foot pain, all of these could contribute to her gait disorder.  When last seen she was asked to take some physical therapy.  She did this but has not followed a home exercise program.  She states therapy was very beneficial for follow-up she continues to drive without difficulty.  She is independent in all activities of daily living.  She denies any falls since last seen.  She returns for reevaluation    UPDATE 2/27/2019CM [name], 84 year old female returns for follow-up for walking difficulty.  DAT scan performed after her visit in January which was negative for Parkinson's disease.  Patient made aware that A dat  scan is pretty sensitive and specific however it is not 100%, we will have to follow her clinically. There is also the possibility her gait disorder is due to all her other diagnoses such as right knee pain, right foot pain, spinal stenosis, lumbar pain with radiation down the left leg, degenerative lumbar disc disease. MRI of the spine showed multilevel degenerative spondylosis,.  She denies any falls.  She denies any loss of bowel or bladder control she continues to drive without she denies any loss of bowel or bladder control difficulty and remains independent in activities of daily living her.  She returns for reevaluation L4-L5 advanced facet arthrosis ligamentum thickening and severe central canal stenosis with mass-effect on the L5 nerve roots and moderate right neural foraminal stenosis with mild mass-effect on exiting right L4 nerve root.  L5-S1 advanced left facet arthrosis and left lateral discovertebral osteophytosis resulting in moderate to severe left neural foraminal stenosis with mass-effect on the exiting left L5 nerve root small broad posterior disc protrusion contacts the S1 nerve root without mass-effect.  She continues to complain with trouble getting up from a chair to standing, sometimes her hands tremble she feels like her hands are weak and she cannot write and she has a stooped posture.  She has not had any recent physical therapy according to the patient.  She does not use an  assistive device .  She denies any falls.  She denies any loss of bowel or bladder control.  She is independent in her activities of daily  living.  She continues to drive without difficulty.  She returns for reevaluation 03/13/17 AAShirley F Meyerhoff is a 84 y.o. female here as a referral from Dr. Inda Merlin for difficulty walking.  Past medical history right knee pain, right foot pain, spinal stenosis, lumbar pain with radiation down the left leg, degenerative lumbar disc disease, symptoms started a year ago, she started  shuffling. Here with daughter-in-law who also provides much information. She feels her posture is fine. She picks her fork up she has some tremor but no resting tremor. She had an MRI in June which did not show any strokes. No falls. She can be walking and go sideways. No freezing episodes. No hypophonia. She sleeps well, she goes to bed early and wakes up early. She hasn;t been able to smell in years, taste appears ok, no weight loss, no difficulty swallowing. Her handwriting is getting very small. No FHx of oarknson's disease. No depression or memory changes. No weakness, she has decreased ROM in the back and on the knee. She has weakness of the right hand. She still mows her yard. Her overall speed has plummeted in everything she does. They attributed it to age. Progressive. Denies pain REVIEW OF SYSTEMS: Full 14 system review of systems performed and notable only for those listed, all others are neg:  Constitutional: neg  Cardiovascular: neg Ear/Nose/Throat: neg Skin: neg Eyes: neg Respiratory: neg Gastroitestinal: neg  Hematology/Lymphatic: neg  Endocrine: neg Musculoskeletal: Walking difficulty, joint pain back pain Allergy/Immunology: neg Neurological: Weakness Psychiatric: neg Sleep : neg   ALLERGIES: Allergies  Allergen Reactions   Flagyl [Metronidazole] Rash    Possible cause of rash     HOME MEDICATIONS: Outpatient Medications Prior to Visit  Medication Sig Dispense Refill   aspirin 81 MG tablet Take 325 mg by mouth daily.      Biotin 10000 MCG TABS Take by mouth. 1/2 tablet daily     cholecalciferol (VITAMIN D) 25 MCG (1000 UT) tablet Take 1,000 Units by mouth daily.     Coenzyme Q10 (COQ-10) 100 MG CAPS Take 1 tablet by mouth daily.     Cyanocobalamin (VITAMIN B 12 PO) Take by mouth. 5, 000 mcg daily     Misc Natural Products (TART CHERRY ADVANCED) CAPS Take by mouth. 2500mg  daily     Multiple Vitamins-Minerals (PRESERVISION AREDS 2 PO) Take by mouth daily.       Omega-3 Fatty Acids (OMEGA-3 FISH OIL PO) Take 1 tablet by mouth daily. 500mg      Polyethylene Glycol 3350 (MIRALAX PO) Take by mouth daily.     Turmeric 500 MG CAPS Take by mouth daily.     UNABLE TO FIND Take 2 tablets by mouth daily. Med Name: Cognium     No facility-administered medications prior to visit.     PAST MEDICAL HISTORY: Past Medical History:  Diagnosis Date   Hypertension    only one time   Right knee pain    Spinal stenosis of lumbar region     PAST SURGICAL HISTORY: Past Surgical History:  Procedure Laterality Date   ABDOMINAL HYSTERECTOMY     HYSTERECTOMY ABDOMINAL WITH SALPINGECTOMY      FAMILY HISTORY: Family History  Problem Relation Age of Onset   Cancer Mother    Cancer Father     SOCIAL HISTORY: Social History   Socioeconomic History   Marital status: Widowed    Spouse name: Not on file   Number of children: Not on file   Years of education: Not  on file   Highest education level: Not on file  Occupational History   Not on file  Social Needs   Financial resource strain: Not on file   Food insecurity:    Worry: Not on file    Inability: Not on file   Transportation needs:    Medical: Not on file    Non-medical: Not on file  Tobacco Use   Smoking status: Never Smoker   Smokeless tobacco: Never Used  Substance and Sexual Activity   Alcohol use: No    Alcohol/week: 0.0 standard drinks   Drug use: No   Sexual activity: Not Currently  Lifestyle   Physical activity:    Days per week: Not on file    Minutes per session: Not on file   Stress: Not on file  Relationships   Social connections:    Talks on phone: Not on file    Gets together: Not on file    Attends religious service: Not on file    Active member of club or organization: Not on file    Attends meetings of clubs or organizations: Not on file    Relationship status: Not on file   Intimate partner violence:    Fear of current or ex partner:  Not on file    Emotionally abused: Not on file    Physically abused: Not on file    Forced sexual activity: Not on file  Other Topics Concern   Not on file  Social History Narrative   Not on file     PHYSICAL EXAM  Vitals:   05/07/18 0904  BP: (!) 152/78  Pulse: 73  Weight: 165 lb 6.4 oz (75 kg)  Height: 5\' 6"  (1.676 m)   Body mass index is 26.7 kg/m.  Generalized: Well developed, in no acute distress, well-groomed Head: normocephalic and atraumatic,. Oropharynx benign  Neck: Supple,  Musculoskeletal: No deformity  Skin no rash or edema Neurological examination   Mentation: Alert oriented to time, place, history taking. Attention span and concentration appropriate. Recent and remote memory intact.  Follows all commands speech and language fluent.   Cranial nerve II-XII: Pupils were equal round reactive to light extraocular movements were full, visual field were full on confrontational test. Facial sensation and strength were normal. hearing was intact to finger rubbing bilaterally. Uvula tongue midline. head turning and shoulder shrug were normal and symmetric.Tongue protrusion into cheek strength was normal. Motor: normal bulk and tone, full strength in the BUE, mild bilateral hip flexor weakness both lower extremities  Sensory: normal and symmetric to light touch, pinprick, and  Vibration, in the upper and lower extremities Coordination: finger-nose-finger, heel-to-shin bilaterally, no dysmetria, no resting tremor Reflexes: Symmetric upper and lower, plantar responses were flexor bilaterally. Gait and Station: Rising up from seated position with push off, stooped posture    moderate stride, mild decreased arm swing  able to perform tiptoe, and heel walking without difficulty. Tandem gait is unsteady.  No assistive device  DIAGNOSTIC DATA (LABS, IMAGING, TESTING) - I reviewed patient records, labs, notes, testing and imaging myself where available.  Lab Results    Component Value Date   WBC 6.8 08/20/2017   HGB 13.4 08/20/2017   HCT 42.3 08/20/2017   MCV 93 08/20/2017   PLT 217 08/20/2017      Component Value Date/Time   NA 143 08/20/2017 0846   K 4.9 08/20/2017 0846   CL 106 08/20/2017 0846   CO2 25 08/20/2017 0846   GLUCOSE 103 (  H) 08/20/2017 0846   GLUCOSE 86 08/15/2016 0721   BUN 14 08/20/2017 0846   CREATININE 0.67 08/20/2017 0846   CALCIUM 10.3 08/20/2017 0846   PROT 7.0 08/20/2017 0846   ALBUMIN 4.8 (H) 08/20/2017 0846   AST 20 08/20/2017 0846   ALT 14 08/20/2017 0846   ALKPHOS 95 08/20/2017 0846   BILITOT 0.5 08/20/2017 0846   GFRNONAA 83 08/20/2017 0846   GFRAA 95 08/20/2017 0846   ASSESSMENT AND PLAN 84 year old with mild  Parkinson symptoms, however DaTscan is negative. She has history of hypertension right knee pain and spinal stenosis of lumbar region.03/19/2018 imaging of the knee with mild chondrocalcinosis and degenerative changes. No acute bony abnormality.  PLAN: Continue home exercise program at least daily Patient to begin PT in neuro rehab on Thursday this week for evaluation and treatment Continue to use walker as needed for safe ambulation F/U prn I spent 20 minutes in total face to face time with the patient / DIL more than 50% of which was spent counseling and coordination of care, reviewing test results reviewing medications and discussing and reviewing the diagnosis of gait abnormality spinal stenosis and right knee pain and further treatment options. . All questions were answered.  Importance of continued home exercise program,.  Dennie Bible, [facility], [facility], APRN  [facility] [address], [place], [place] [address] [phone]

## 2018-05-07 ENCOUNTER — Encounter: Payer: Self-pay | Admitting: Nurse Practitioner

## 2018-05-07 ENCOUNTER — Ambulatory Visit: Payer: Medicare Other | Admitting: Nurse Practitioner

## 2018-05-07 ENCOUNTER — Other Ambulatory Visit: Payer: Self-pay

## 2018-05-07 VITALS — BP 152/78 | HR 73 | Ht 66.0 in | Wt 165.4 lb

## 2018-05-07 DIAGNOSIS — R29898 Other symptoms and signs involving the musculoskeletal system: Secondary | ICD-10-CM | POA: Diagnosis not present

## 2018-05-07 DIAGNOSIS — M48061 Spinal stenosis, lumbar region without neurogenic claudication: Secondary | ICD-10-CM

## 2018-05-07 DIAGNOSIS — M25561 Pain in right knee: Secondary | ICD-10-CM | POA: Insufficient documentation

## 2018-05-07 DIAGNOSIS — R269 Unspecified abnormalities of gait and mobility: Secondary | ICD-10-CM | POA: Diagnosis not present

## 2018-05-07 NOTE — Patient Instructions (Addendum)
Continue home exercise program at least daily Patient to begin PT in neuro rehab on Thursday this week for evaluation and treatment Continue to use walker as needed for safe ambulation F/U prn

## 2018-05-09 ENCOUNTER — Other Ambulatory Visit: Payer: Self-pay

## 2018-05-09 ENCOUNTER — Ambulatory Visit: Payer: Medicare Other | Attending: Family Medicine | Admitting: Physical Therapy

## 2018-05-09 ENCOUNTER — Encounter: Payer: Self-pay | Admitting: Physical Therapy

## 2018-05-09 DIAGNOSIS — R2689 Other abnormalities of gait and mobility: Secondary | ICD-10-CM | POA: Diagnosis present

## 2018-05-09 DIAGNOSIS — R2681 Unsteadiness on feet: Secondary | ICD-10-CM

## 2018-05-09 DIAGNOSIS — R29818 Other symptoms and signs involving the nervous system: Secondary | ICD-10-CM | POA: Diagnosis present

## 2018-05-09 DIAGNOSIS — M6281 Muscle weakness (generalized): Secondary | ICD-10-CM

## 2018-05-09 DIAGNOSIS — R29898 Other symptoms and signs involving the musculoskeletal system: Secondary | ICD-10-CM

## 2018-05-09 NOTE — Patient Instructions (Signed)
      [email or web address] You Tube PWR exercises

## 2018-05-10 NOTE — Therapy (Signed)
[facility] [address] [place], [place], [address] Phone: [phone]   Fax:  [phone]  Physical Therapy Evaluation  Patient Details  Name: Jamie [name] MRN: [id number] Date of Birth: 1934-06-01 Referring Provider (PT): Lyndal Pulley, DO   Encounter Date: 05/09/2018  PT End of Session - 05/10/18 1132    Visit Number  1    Number of Visits  17    Date for PT Re-Evaluation  06/08/18    Authorization Type  [employer] Medicare $20 copay    Authorization Time Period  05/09/18 to 07/08/2018    PT Start Time  1536    PT Stop Time  1628    PT Time Calculation (min)  52 min    Activity Tolerance  Patient tolerated treatment well    Behavior During Therapy  [facility] for tasks assessed/performed;Flat affect       Past Medical History:  Diagnosis Date  . Hypertension    only one time  . Right knee pain   . Spinal stenosis of lumbar region     Past Surgical History:  Procedure Laterality Date  . ABDOMINAL HYSTERECTOMY    . HYSTERECTOMY ABDOMINAL WITH SALPINGECTOMY      There were no vitals filed for this visit.   Subjective Assessment - 05/09/18 1543    Subjective  Reports she has been tested for Parkinson's and told she does not have PD. Thinks her symptoms began 1+ yrs ago (pt requested son not come back for evaluation, therefore unable to confirm). "In the morning when more stiff I use the RW, otherwise I don't use it" (even when in the community, however does not go anywhere unless family is with her)    Pertinent History  HTN, rt knee pain, spinal stenosis, dementia, rt knee injection    Currently in Pain?  No/denies         [facility] PT Assessment - 05/09/18 1558      Assessment   Medical Diagnosis  Balance disorder, Coordination abnormal    Referring Provider (PT)  Lyndal Pulley, DO    Onset Date/Surgical Date  --   04/18/18 MD referral   Prior Therapy  "a few visits" at a clinic in [place]. "It really  didn't do anything for me"      Precautions   Precautions  Fall      Balance Screen   Has the patient fallen in the past 6 months  No    Has the patient had a decrease in activity level because of a fear of falling?   Yes   pt reports family has limited her activity (no mowing, shopp   Is the patient reluctant to leave their home because of a fear of falling?   No      Home Environment   Living Environment  Private residence    Living Arrangements  Alone    Available Help at Discharge  Family;Available PRN/intermittently   live nearby   Type of [place] to enter    Entrance Stairs-Number of Steps  4    Entrance Stairs-Rails  Right;Left;Cannot reach both    Home Layout  One level    [place] - 2 wheels;Grab bars - tub/shower   tall toilet     Prior Function   Level of Independence  Needs assistance with homemaking   family does grocery shopping, yard work, some cleaning   Leisure  yard work; Chemical engineer; read; sew      Cognition   Overall Cognitive Status  History of cognitive impairments - at baseline   per MD records h/o dementia     Observation/Other Assessments   Focus on Therapeutic Outcomes (FOTO)   NA      Sensation   Light Touch  Appears Intact      Coordination   Gross Motor Movements are Fluid and Coordinated  No    Fine Motor Movements are Fluid and Coordinated  No   toe taps asynchronous (rt slower, decr ROM vs left)     Posture/Postural Control   Posture/Postural Control  Postural limitations    Postural Limitations  Rounded Shoulders;Forward head;Decreased lumbar lordosis      ROM / Strength   AROM / PROM / Strength  AROM;PROM;Strength      AROM   Overall AROM   Deficits    Overall AROM Comments  limited knee extension in sitting; limited finger extension (arthritis changes)      PROM   Overall PROM   Deficits    Overall PROM Comments  seated knee extension lacking ~10 rt, ~5 left      Strength   Overall  Strength  Deficits    Strength Assessment Site  Knee;Hip;Ankle    Right/Left Hip  Right;Left    Right Hip Flexion  3+/5    Right Hip ABduction  3+/5   sitting   Left Hip Flexion  3+/5    Left Hip ABduction  3+/5   sitting   Right/Left Knee  Right;Left    Right Knee Flexion  3/5    Right Knee Extension  3+/5    Left Knee Flexion  3/5    Left Knee Extension  3+/5    Right/Left Ankle  Right;Left    Right Ankle Dorsiflexion  3+/5    Left Ankle Dorsiflexion  4/5      Transfers   Transfers  Sit to Stand;Stand to Sit    Sit to Stand  6: Modified independent (Device/Increase time);With upper extremity assist;With armrests    Five time sit to stand comments   --   deferred due to significant difficulty/incr time for 1x    Stand to Sit  6: Modified independent (Device/Increase time);With armrests;With upper extremity assist    Comments  unable to stand from chair without UE assist      Ambulation/Gait   Ambulation/Gait  Yes    Ambulation/Gait Assistance  6: Modified independent (Device/Increase time)    Ambulation Distance (Feet)  40 Feet   75, 120   Assistive device  None    Gait Pattern  Step-through pattern;Decreased arm swing - right;Decreased arm swing - left;Decreased step length - right;Decreased step length - left;Decreased dorsiflexion - right;Decreased dorsiflexion - left;Decreased weight shift to right;Decreased weight shift to left;Left foot flat;Shuffle;Decreased trunk rotation;Trunk flexed;Poor foot clearance - left;Poor foot clearance - right;Right foot flat   no heelstrike, anterior wt-shift (nearly toe-walking)   Gait velocity  32.8/15.66=2.09 ft/sec   norm for age 41.7 ft/sec     Standardized Balance Assessment   Standardized Balance Assessment  Timed Up and Go Test      Timed Up and Go Test   Normal TUG (seconds)  17.41    Cognitive TUG (seconds)  19.69   13%               Objective measurements completed on examination: See above findings.  PT Education - 05/10/18 1129    Education Details  results of eval; incr fall risk by all measures; benefits of PT, including education in PWR exercises for carryover to functional tasks; how to access PWR videos through You Tube     Person(s) Educated  Patient;Child(ren)   patient agreed to include son in session "wrap-up"   Methods  Explanation;Handout    Comprehension  Verbalized understanding;Need further instruction       PT Short Term Goals - 05/10/18 1159      PT SHORT TERM GOAL #1   Title  Patient independent with basic HEP (Target all STGs: 4 weeks, 06/08/2018)      PT SHORT TERM GOAL #2   Title  Patient will ambulate 200 ft with supervision over unlevel outdoor surfaces (primarily grass to allow for return to mowing her lawn)      PT SHORT TERM GOAL #3   Title  Patient will verbalize understanding of fall prevention materials provided    Baseline  --      PT SHORT TERM GOAL #4   Title  Patient will demonstrate modified independent floor to chair/bed transfer, in case of fall at home.     Baseline  --      PT SHORT TERM GOAL #5   Title  --      Additional Short Term Goals   Additional Short Term Goals  Yes        PT Long Term Goals - 05/10/18 1211      PT LONG TERM GOAL #1   Title  Patient will be independent with updated HEP, including an aerobic/cardiovascular component (Target all LTGs: 8 weeks, 07/08/2018)      PT LONG TERM GOAL #2   Title  Patient will improve gait velocity to >= 2.62 ft/sec towards lesser fall risk in the community.     Baseline  05/09/18 2.09 ft/sec      PT LONG TERM GOAL #3   Title  Patient will improve TUG to <=13.5 seconds demonstrating a lesser fall risk score    Baseline  05/09/18 17.41 sec      PT LONG TERM GOAL #4   Title  Patient will improve difference between TUG normal and TUG cognitive to <10% (indicative of lesser fall risk)     Baseline  05/09/18 13% difference             Plan - 05/10/18  1134    Clinical Impression Statement  Patient referred to OPPT for balance disorder and abnormal coordination. She reports symptoms for > 1 year and has had testing for Parkinson's Disease which was negative. She does exhibit movement patterns and deficits consistent with Parkinson's (stooped posture, nearly festinating gait, decreased facial expression, stiffness). Assessments indicate patient is at increased risk for falls (gait velocity <2.62 ft/sec, TUG normal >13.5 sec, TUG cognitive >10% increase over TUG normal). Patient can benefit from the PT interventions listed below to address the deficits listed below.     Personal Factors and Comorbidities  Comorbidity 3+    Comorbidities  rt knee pain, spinal stenosis, dementia, HTN    Examination-Activity Limitations  Bend;Stairs;Locomotion Level;Transfers    Examination-Participation Restrictions  Cleaning;Community Activity;Driving;Shop;Yard Work    Merchant navy officer  Evolving/Moderate complexity    Clinical Decision Making  Moderate    Rehab Potential  Good    PT Frequency  2x / week    PT Duration  8 weeks    PT Treatment/Interventions  ADLs/Self Care  Home Management;DME Instruction;Gait training;Stair training;Functional mobility training;Therapeutic activities;Therapeutic exercise;Balance training;Patient/family education;Cognitive remediation;Neuromuscular re-education;Passive range of motion    PT Next Visit Plan  did she try PWR exercises? able to view on YouTube? educate on PWR supine and possibly PWR stand at counter; gait training; balance reactions    Consulted and Agree with Plan of Care  Patient;Family member/caregiver    Family Member Consulted  son       Patient will benefit from skilled therapeutic intervention in order to improve the following deficits and impairments:  Abnormal gait, Decreased balance, Decreased coordination, Decreased cognition, Decreased knowledge of use of DME, Decreased mobility, Decreased  range of motion, Decreased safety awareness, Difficulty walking, Decreased strength, Impaired perceived functional ability, Impaired flexibility, Postural dysfunction  Visit Diagnosis: Unsteadiness on feet - Plan: PT plan of care cert/re-cert  Other abnormalities of gait and mobility - Plan: PT plan of care cert/re-cert  Muscle weakness (generalized) - Plan: PT plan of care cert/re-cert  Other symptoms and signs involving the musculoskeletal system - Plan: PT plan of care cert/re-cert  Other symptoms and signs involving the nervous system - Plan: PT plan of care cert/re-cert     Problem List Patient Active Problem List   Diagnosis Date Noted  . Right knee pain 05/07/2018  . Degenerative joint disease of knee, right 03/19/2018  . Recurrent R knee pain- acute on chronic after twisting injury 03/02/18 03/12/2018  . Osteoarthritis of knee 09/18/2017  . Mixed hyperlipidemia 08/22/2017  . Prediabetes 08/22/2017  . White coat syndrome without diagnosis of hypertension 08/22/2017  . History of vitamin D deficiency 08/22/2017  . Lumbar facet arthropathy 06/27/2017  . History of hysterectomy for benign disease 06/27/2017  . Anxiousness 06/27/2017  . History of dizziness 06/27/2017  . Other fatigue 06/27/2017  . Overweight (BMI 25.0-29.9) 06/27/2017  . Weakness of both hips 04/18/2017  . Abnormality of gait 04/18/2017  . Spinal stenosis of lumbar region 04/18/2017  . Parkinsonian-like syndrome [facility] sees Neuro- Dr Jaynee Eagles 03/13/2017  . Pancolitis ([place]) 08/14/2016  . h/o Hypertension 08/14/2016  . Leukocytes in urine 08/14/2016  . h/o Hyperlipidemia     Rexanne Mano, PT 05/10/2018, 12:30 PM  [facility] [address] [place], [place], [address] Phone: [phone]   Fax:  [phone]  Name: Jamie [name] MRN: [id number] Date of Birth: 08-19-1934

## 2018-05-17 ENCOUNTER — Ambulatory Visit: Payer: Medicare Other | Admitting: Physical Therapy

## 2018-05-23 ENCOUNTER — Ambulatory Visit (INDEPENDENT_AMBULATORY_CARE_PROVIDER_SITE_OTHER): Payer: Medicare Other | Admitting: Family Medicine

## 2018-05-23 ENCOUNTER — Encounter: Payer: Self-pay | Admitting: Family Medicine

## 2018-05-23 DIAGNOSIS — M1711 Unilateral primary osteoarthritis, right knee: Secondary | ICD-10-CM

## 2018-05-23 NOTE — Assessment & Plan Note (Signed)
Doing well  Did not see knee.  Discussed worsening symptoms what we could do  F/u 6-8 weeks

## 2018-05-23 NOTE — Progress Notes (Signed)
[name] [place],  [address] Phone: [phone] Subjective:   Virtual Visit via Video Note  I connected with Jamie [name] on 05/23/18 at  3:30 PM EDT by a video enabled telemedicine application and verified that I am speaking with the correct person using two identifiers.   I discussed the limitations of evaluation and management by telemedicine and the availability of in person appointments. The patient expressed understanding and agreed to proceed.    I discussed the assessment and treatment plan with the patient. The patient was provided an opportunity to ask questions and all were answered. The patient agreed with the plan and demonstrated an understanding of the instructions.   The patient was advised to call back or seek an in-person evaluation if the symptoms worsen or if the condition fails to improve as anticipated.  I provided 18 minutes of non-face-to-face time during this encounter.   Lyndal Pulley, DO    CC: Knee pain follow-up  [id number]  Jamie [name] is a 84 y.o. female coming in with complaint of right knee pain.  Known arthritic changes.  Given injection 6 weeks ago.  Feeling significantly better.  Even started to mow the yard the other day.  Had some mild swelling afterwards.     Past Medical History:  Diagnosis Date  . Hypertension    only one time  . Right knee pain   . Spinal stenosis of lumbar region    Past Surgical History:  Procedure Laterality Date  . ABDOMINAL HYSTERECTOMY    . HYSTERECTOMY ABDOMINAL WITH SALPINGECTOMY     Social History   Socioeconomic History  . Marital status: Widowed    Spouse name: Not on file  . Number of children: Not on file  . Years of education: Not on file  . Highest education level: Not on file  Occupational History  . Not on file  Social Needs  . Financial resource strain: Not on file  . Food insecurity:    Worry: Not on file    Inability:  Not on file  . Transportation needs:    Medical: Not on file    Non-medical: Not on file  Tobacco Use  . Smoking status: Never Smoker  . Smokeless tobacco: Never Used  Substance and Sexual Activity  . Alcohol use: No    Alcohol/week: 0.0 standard drinks  . Drug use: No  . Sexual activity: Not Currently  Lifestyle  . Physical activity:    Days per week: Not on file    Minutes per session: Not on file  . Stress: Not on file  Relationships  . Social connections:    Talks on phone: Not on file    Gets together: Not on file    Attends religious service: Not on file    Active member of club or organization: Not on file    Attends meetings of clubs or organizations: Not on file    Relationship status: Not on file  Other Topics Concern  . Not on file  Social History Narrative  . Not on file   Allergies  Allergen Reactions  . Flagyl [Metronidazole] Rash    Possible cause of rash    Family History  Problem Relation Age of Onset  . Cancer Mother   . Cancer Father        Current Outpatient Medications (Analgesics):  .  aspirin 81 MG tablet, Take 325 mg by mouth daily.   Current  Outpatient Medications (Hematological):  Marland Kitchen  Cyanocobalamin (VITAMIN B 12 PO), Take by mouth. 5, 000 mcg daily  Current Outpatient Medications (Other):  .  Biotin 10000 MCG TABS, Take by mouth. 1/2 tablet daily .  cholecalciferol (VITAMIN D) 25 MCG (1000 UT) tablet, Take 1,000 Units by mouth daily. .  Coenzyme Q10 (COQ-10) 100 MG CAPS, Take 1 tablet by mouth daily. .  Misc Natural Products (TART CHERRY ADVANCED) CAPS, Take by mouth. 2500mg  daily .  Multiple Vitamins-Minerals (PRESERVISION AREDS 2 PO), Take by mouth daily. .  Omega-3 Fatty Acids (OMEGA-3 FISH OIL PO), Take 1 tablet by mouth daily. 500mg  .  Polyethylene Glycol 3350 (MIRALAX PO), Take by mouth daily. .  Turmeric 500 MG CAPS, Take by mouth daily. Marland Kitchen  UNABLE TO FIND, Take 2 tablets by mouth daily. Med Name: Cognium    Past medical  history, social, surgical and family history all reviewed in electronic medical record.  No pertanent information unless stated regarding to the chief complaint.   Review of Systems: . Positive muscle aches mild swelling of left leg   Objective     General: No apparent distress alert and oriented x3 mood and affect normal, dressed appropriately.      Impression and Recommendations:     . The above documentation has been reviewed and is accurate and complete Lyndal Pulley, DO       Note: This dictation was prepared with Dragon dictation along with smaller phrase technology. Any transcriptional errors that result from this process are unintentional.

## 2018-06-23 NOTE — Progress Notes (Signed)
[name] [place], [place] [address] Phone: [phone] Subjective:   [name], am serving as a scribe for Dr. Hulan Saas.    CC: Knee pain follow-up  [id number]  Jamie [name] is a 84 y.o. female coming in with complaint of right knee pain. Had virtual visit on 05/23/2018. Having pain with sitting to stand. Pain is intermittent but seems to be getting worse. Patient is also having leg swelling on the right side for one week.      Past Medical History:  Diagnosis Date  . Hypertension    only one time  . Right knee pain   . Spinal stenosis of lumbar region    Past Surgical History:  Procedure Laterality Date  . ABDOMINAL HYSTERECTOMY    . HYSTERECTOMY ABDOMINAL WITH SALPINGECTOMY     Social History   Socioeconomic History  . Marital status: Widowed    Spouse name: Not on file  . Number of children: Not on file  . Years of education: Not on file  . Highest education level: Not on file  Occupational History  . Not on file  Social Needs  . Financial resource strain: Not on file  . Food insecurity:    Worry: Not on file    Inability: Not on file  . Transportation needs:    Medical: Not on file    Non-medical: Not on file  Tobacco Use  . Smoking status: Never Smoker  . Smokeless tobacco: Never Used  Substance and Sexual Activity  . Alcohol use: No    Alcohol/week: 0.0 standard drinks  . Drug use: No  . Sexual activity: Not Currently  Lifestyle  . Physical activity:    Days per week: Not on file    Minutes per session: Not on file  . Stress: Not on file  Relationships  . Social connections:    Talks on phone: Not on file    Gets together: Not on file    Attends religious service: Not on file    Active member of club or organization: Not on file    Attends meetings of clubs or organizations: Not on file    Relationship status: Not on file  Other Topics Concern  . Not on file  Social History  Narrative  . Not on file   Allergies  Allergen Reactions  . Flagyl [Metronidazole] Rash    Possible cause of rash    Family History  Problem Relation Age of Onset  . Cancer Mother   . Cancer Father        Current Outpatient Medications (Analgesics):  .  aspirin 81 MG tablet, Take 325 mg by mouth daily.   Current Outpatient Medications (Hematological):  Marland Kitchen  Cyanocobalamin (VITAMIN B 12 PO), Take by mouth. 5, 000 mcg daily  Current Outpatient Medications (Other):  .  Biotin 10000 MCG TABS, Take by mouth. 1/2 tablet daily .  cholecalciferol (VITAMIN D) 25 MCG (1000 UT) tablet, Take 1,000 Units by mouth daily. .  Coenzyme Q10 (COQ-10) 100 MG CAPS, Take 1 tablet by mouth daily. .  Misc Natural Products (TART CHERRY ADVANCED) CAPS, Take by mouth. 2500mg  daily .  Multiple Vitamins-Minerals (PRESERVISION AREDS 2 PO), Take by mouth daily. .  Omega-3 Fatty Acids (OMEGA-3 FISH OIL PO), Take 1 tablet by mouth daily. 500mg  .  Polyethylene Glycol 3350 (MIRALAX PO), Take by mouth daily. .  Turmeric 500 MG CAPS, Take by mouth daily. Marland Kitchen  UNABLE  TO FIND, Take 2 tablets by mouth daily. Med Name: Cognium    Past medical history, social, surgical and family history all reviewed in electronic medical record.  No pertanent information unless stated regarding to the chief complaint.   Review of Systems:  No headache, visual changes, nausea, vomiting, diarrhea, constipation, dizziness, abdominal pain, skin rash, fevers, chills, night sweats, weight loss, swollen lymph nodes, body aches,,  chest pain, shortness of breath, mood changes.  Positive muscle aches, joint swelling  Objective  Blood pressure 126/86, pulse 78, height 5\' 6"  (1.676 m), weight 165 lb (74.8 kg), SpO2 97 %.   General: No apparent distress alert and oriented x3 mood and affect normal, dressed appropriately.  HEENT: Pupils equal, extraocular movements intact  Respiratory: Patient's speak in full sentences and does not appear short  of breath  Cardiovascular: Trace lower extremity edema, non tender, no erythema  Skin: Warm dry intact with no signs of infection or rash on extremities or on axial skeleton.  Abdomen: Soft nontender  Neuro: Cranial nerves II through XII are intact, neurovascularly intact in all extremities with 2+ DTRs and 2+ pulses.  Lymph: No lymphadenopathy of posterior or anterior cervical chain or axillae bilaterally.  Gait antalgic gait walking with the aid of a walker MSK:  tender with full range of motion and good stability and symmetric strength and tone of shoulders, elbows, wrist, hip, and ankles bilaterally.  Knee: Right valgus deformity noted.  Abnormal thigh to calf ratio.  Tender to palpation over medial and PF joint line.  ROM full in flexion and extension and lower leg rotation. instability with valgus force.  painful patellar compression. Patellar glide with moderate crepitus. Patellar and quadriceps tendons unremarkable. Hamstring and quadriceps strength is normal. Contralateral knee shows mild arthritic changes as well  After informed written and verbal consent, patient was seated on exam table. Right knee was prepped with alcohol swab and utilizing anterolateral approach, patient's right knee space was injected with 4:1  marcaine 0.5%: Kenalog 40mg /dL. Patient tolerated the procedure well without immediate complications.    Impression and Recommendations:     This case required medical decision making of moderate complexity. The above documentation has been reviewed and is accurate and complete Lyndal Pulley, DO       Note: This dictation was prepared with Dragon dictation along with smaller phrase technology. Any transcriptional errors that result from this process are unintentional.

## 2018-06-24 ENCOUNTER — Encounter: Payer: Self-pay | Admitting: Family Medicine

## 2018-06-24 ENCOUNTER — Ambulatory Visit: Payer: Medicare Other | Admitting: Family Medicine

## 2018-06-24 ENCOUNTER — Other Ambulatory Visit: Payer: Self-pay

## 2018-06-24 DIAGNOSIS — M1711 Unilateral primary osteoarthritis, right knee: Secondary | ICD-10-CM | POA: Diagnosis not present

## 2018-06-24 NOTE — Assessment & Plan Note (Signed)
Patient does have knee arthritis.  Given injection today.  Tolerated the procedure well.  If needed we will consider viscosupplementation patient has been approved.  Patient will follow-up with me again in 10 weeks otherwise.

## 2018-07-19 ENCOUNTER — Other Ambulatory Visit: Payer: Self-pay

## 2018-07-19 ENCOUNTER — Encounter: Payer: Self-pay | Admitting: Physical Therapy

## 2018-07-19 ENCOUNTER — Ambulatory Visit: Payer: Medicare Other | Attending: Family Medicine | Admitting: Physical Therapy

## 2018-07-19 DIAGNOSIS — R2681 Unsteadiness on feet: Secondary | ICD-10-CM

## 2018-07-19 DIAGNOSIS — R29818 Other symptoms and signs involving the nervous system: Secondary | ICD-10-CM

## 2018-07-19 DIAGNOSIS — R29898 Other symptoms and signs involving the musculoskeletal system: Secondary | ICD-10-CM

## 2018-07-19 DIAGNOSIS — R2689 Other abnormalities of gait and mobility: Secondary | ICD-10-CM | POA: Diagnosis present

## 2018-07-19 DIAGNOSIS — M6281 Muscle weakness (generalized): Secondary | ICD-10-CM | POA: Diagnosis present

## 2018-07-19 NOTE — Patient Instructions (Addendum)
Sit to Stand Transfers:  1. Scoot out to the edge of the chair 2. Place your feet flat on the floor, shoulder width apart.  Make sure your feet are tucked just under your knees. 3. Lean forward (nose over toes) with momentum, and stand up tall with your best posture.  If you need to use your arms, use them as a quick boost up to stand. 4. If you are in a low or soft chair, you can lean back and then forward up to stand, in order to get more momentum. 5. Once you are standing, make sure you are looking ahead and standing tall.  To sit down:  1. Back up until you feel the chair behind your legs. 2. Bend at your hips, reaching  Back for you chair, if needed, then slowly squat to sit down on your chair.  Try to do this from your bed height (which should be higher than the chair), repeating 5-10 reps in a row.  Repeat 1-2 times per day.   Provided again with cues (did not want patient to do twist)

## 2018-07-19 NOTE — Therapy (Signed)
[facility] [address] [place], [place], [address] Phone: [phone]   Fax:  [phone]  Physical Therapy Treatment  Patient Details  Name: Jamie [name] MRN: [id number] Date of Birth: Mar 18, 1934 Referring Provider (PT): Lyndal Pulley, DO   Encounter Date: 07/19/2018  CLINIC OPERATION CHANGES: [facility] is operating at a low capacity due to COVID-19.  The patient was brought into the clinic for evaluation and/or treatment following universal masking by staff, social distancing, and <10 people in the clinic.  The patient's COVID risk of complications score is 4.   PT End of Session - 07/19/18 1757    Visit Number  2    Number of Visits  17    Date for PT Re-Evaluation  09/17/18   per renewal 07/19/2018   Authorization Type  [employer] Medicare $20 copay    Authorization Time Period       PT Start Time  1532    PT Stop Time  1612    PT Time Calculation (min)  40 min    Activity Tolerance  Patient tolerated treatment well    Behavior During Therapy  WFL for tasks assessed/performed;Flat affect       Past Medical History:  Diagnosis Date  . Hypertension    only one time  . Right knee pain   . Spinal stenosis of lumbar region     Past Surgical History:  Procedure Laterality Date  . ABDOMINAL HYSTERECTOMY    . HYSTERECTOMY ABDOMINAL WITH SALPINGECTOMY      There were no vitals filed for this visit.  Subjective Assessment - 07/19/18 1533    Subjective  Things are doing okay.  Got a shot in R knee and that has helped.  Denies falls or stumbles.  "Try to be real careful"  Getting up and down out of chairs is hard, R leg just doesn't want to work sometimes.    Pertinent History  HTN, rt knee pain, spinal stenosis, dementia, rt knee injection    Patient Stated Goals  "Main problem would be getting up from chair without help" and car transfers    Currently in Pain?  No/denies                        [facility] Adult PT Treatment/Exercise - 07/19/18 0001      Transfers   Transfers  Sit to Stand;Stand to Sit    Sit to Stand  With upper extremity assist;With armrests;5: Supervision;From chair/3-in-1    Sit to Stand Details  Verbal cues for sequencing;Verbal cues for technique;Tactile cues for initiation    Stand to Sit  5: Supervision;With upper extremity assist;To chair/3-in-1    Number of Reps  10 reps   from chair, then 10 reps from elevated mat   Transfer Cueing  Cues for correct technique-scooting forward, forward lean, push up to boost from chair (pt does not fully push up using UEs)      Ambulation/Gait   Ambulation/Gait  Yes    Ambulation/Gait Assistance  6: Modified independent (Device/Increase time)    Ambulation Distance (Feet)  115 Feet   40 ft x 2   Assistive device  None    Gait Pattern  Step-through pattern;Decreased arm swing - right;Decreased arm swing - left;Decreased step length - right;Decreased step length - left;Decreased dorsiflexion - right;Decreased dorsiflexion - left;Decreased weight shift to right;Decreased weight shift to left;Left foot flat;Shuffle;Decreased trunk rotation;Trunk flexed;Poor foot clearance -  left;Poor foot clearance - right;Right foot flat   no heelstrike   Gait Comments  Cues for increased heelstrike with gait (pt able to initiate, but not maintain)      Therapeutic Activites    Therapeutic Activities  Other Therapeutic Activities    Other Therapeutic Activities  practiced exercises to assist with getting in and out of car:  marching in place at edge of chair x 10 reps, with cues for increased intensity; then marching out and in, single leg x 5 reps, then marching out/out, in/in x 5 reps each side; then simulated car transfer at edge of mat-high step over/together to simulate getting in and out of car.  Cues for lateral scooting to assist with leg lifting.  RLE has more difficulty lifting than LLE.       Exercises   Exercises  Ankle      Ankle Exercises: Seated   Toe Raise  10 reps;3 seconds       Neuro Re-education-Review of HEP given last visit:   Pt performs PWR! Moves in standing position at counter x 10 reps   PWR! Up for improved posture  PWR! Rock for improved weighshifting  PWR! Twist for improved trunk rotation -did not perform  PWR! Step for improved step initiation to side direction with UE support  Verbal, tactile, visual cues provided for technique and intensity        PT Education - 07/19/18 1756    Education Details  discussed POC (pt agreeable to 2 visits only); reviewed and gave again PWR! Moves in standing; sit<>stand technqiue    Person(s) Educated  Patient    Methods  Explanation;Demonstration;Tactile cues;Verbal cues;Handout    Comprehension  Verbalized understanding;Returned demonstration;Verbal cues required;Need further instruction       PT Short Term Goals - 07/19/18 1800      PT SHORT TERM GOAL #1   Title  Patient independent with basic HEP (Target all STGs: 4 weeks, 08/16/2018)    Time  4    Period  Weeks    Status  On-going      PT SHORT TERM GOAL #2   Title  Patient will ambulate 200 ft with supervision over unlevel outdoor surfaces (primarily grass to allow for return to mowing her lawn)    Time  4    Period  Weeks    Status  On-going      PT SHORT TERM GOAL #3   Title  Patient will verbalize understanding of fall prevention materials provided    Time  4    Period  Weeks    Status  On-going      PT SHORT TERM GOAL #4   Title  Patient will demonstrate modified independent floor to chair/bed transfer, in case of fall at home.     Time  4    Period  Weeks    Status  On-going        PT Long Term Goals - 07/19/18 1801      PT LONG TERM GOAL #1   Title  Patient will be independent with updated HEP, including an aerobic/cardiovascular component (Target all LTGs: 8 weeks, 09/13/2018)    Time  8    Period  Weeks    Status   On-going      PT LONG TERM GOAL #2   Title  Patient will improve gait velocity to >= 2.62 ft/sec towards lesser fall risk in the community.     Baseline  05/09/18 2.09 ft/sec  Time  8    Status  On-going      PT LONG TERM GOAL #3   Title  Patient will improve TUG to <=13.5 seconds demonstrating a lesser fall risk score    Baseline  05/09/18 17.41 sec    Time  8    Period  Weeks    Status  On-going      PT LONG TERM GOAL #4   Title  Patient will improve difference between TUG normal and TUG cognitive to <10% (indicative of lesser fall risk)     Baseline  05/09/18 13% difference    Time  8    Period  Weeks    Status  On-going            Plan - 07/19/18 1758    Clinical Impression Statement  Pt returns to PT for first in-clinic visit since eval in March 2020 due to COVID-19 restrictions.  She has had no falls, but presentation with gait and transfers is similar to descriptions at eval.  She would continue to benefit from skilled PT to address those deficits, but she is agreeable to only 2 visits at this time (due to transportation relying on family).  Focused session reviewing HEP, educating on sit<>stand transfers and working on siimulated car transfers.    Personal Factors and Comorbidities  Comorbidity 3+    Comorbidities  rt knee pain, spinal stenosis, dementia, HTN    Examination-Activity Limitations  Bend;Stairs;Locomotion Level;Transfers    Examination-Participation Restrictions  Cleaning;Community Activity;Driving;Shop;Yard Work    [employer]  Evolving/Moderate complexity    Rehab Potential  Good    PT Frequency  2x / week    PT Duration  8 weeks   per renewal 07/19/2018-pt may only come 2 visits per her request   PT Treatment/Interventions  ADLs/Self Care Home Management;DME Instruction;Gait training;Stair training;Functional mobility training;Therapeutic activities;Therapeutic exercise;Balance training;Patient/family education;Cognitive  remediation;Neuromuscular re-education;Passive range of motion    PT Next Visit Plan  Review PWR! Moves in standing and sit<>stand technique (did not continue supine, as pt has not yet done at home and did not remember standing PWR! Moves well); gait training and balance reactions; fall prevention educaiton    Consulted and Agree with Plan of Care  Patient    Family Member Consulted          Patient will benefit from skilled therapeutic intervention in order to improve the following deficits and impairments:  Abnormal gait, Decreased balance, Decreased coordination, Decreased cognition, Decreased knowledge of use of DME, Decreased mobility, Decreased range of motion, Decreased safety awareness, Difficulty walking, Decreased strength, Impaired perceived functional ability, Impaired flexibility, Postural dysfunction  Visit Diagnosis: Unsteadiness on feet  Other abnormalities of gait and mobility  Muscle weakness (generalized)  Other symptoms and signs involving the nervous system  Other symptoms and signs involving the musculoskeletal system     Problem List Patient Active Problem List   Diagnosis Date Noted  . Right knee pain 05/07/2018  . Degenerative joint disease of knee, right 03/19/2018  . Recurrent R knee pain- acute on chronic after twisting injury 03/02/18 03/12/2018  . Osteoarthritis of knee 09/18/2017  . Mixed hyperlipidemia 08/22/2017  . Prediabetes 08/22/2017  . White coat syndrome without diagnosis of hypertension 08/22/2017  . History of vitamin D deficiency 08/22/2017  . Lumbar facet arthropathy 06/27/2017  . History of hysterectomy for benign disease 06/27/2017  . Anxiousness 06/27/2017  . History of dizziness 06/27/2017  . Other fatigue 06/27/2017  . Overweight (BMI  25.0-29.9) 06/27/2017  . Weakness of both hips 04/18/2017  . Abnormality of gait 04/18/2017  . Spinal stenosis of lumbar region 04/18/2017  . 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 Pancolitis ([place]) 08/14/2016  . h/o Hypertension 08/14/2016  . Leukocytes in urine 08/14/2016  . h/o Hyperlipidemia     Kiani Wurtzel W. 07/19/2018, 6:02 PM  [facility] [address] [place], [place], [address] Phone: [phone]   Fax:  [phone]  Name: Jamie [name] MRN: [id number] Date of Birth: [birth year]

## 2018-07-29 ENCOUNTER — Other Ambulatory Visit: Payer: Self-pay

## 2018-07-29 ENCOUNTER — Encounter: Payer: Self-pay | Admitting: Family Medicine

## 2018-07-29 ENCOUNTER — Ambulatory Visit (INDEPENDENT_AMBULATORY_CARE_PROVIDER_SITE_OTHER): Payer: Medicare Other | Admitting: Family Medicine

## 2018-07-29 VITALS — Wt 165.0 lb

## 2018-07-29 DIAGNOSIS — E785 Hyperlipidemia, unspecified: Secondary | ICD-10-CM

## 2018-07-29 DIAGNOSIS — R7303 Prediabetes: Secondary | ICD-10-CM | POA: Diagnosis not present

## 2018-07-29 DIAGNOSIS — M159 Polyosteoarthritis, unspecified: Secondary | ICD-10-CM | POA: Insufficient documentation

## 2018-07-29 DIAGNOSIS — Z8639 Personal history of other endocrine, nutritional and metabolic disease: Secondary | ICD-10-CM | POA: Diagnosis not present

## 2018-07-29 DIAGNOSIS — I10 Essential (primary) hypertension: Secondary | ICD-10-CM | POA: Diagnosis not present

## 2018-07-29 DIAGNOSIS — R29898 Other symptoms and signs involving the musculoskeletal system: Secondary | ICD-10-CM

## 2018-07-29 NOTE — Progress Notes (Signed)
Virtual / live video office visit note for [employer], D.O- Primary Care Physician at [facility]   I connected with current patient today and beyond visually recognizing the correct individual, I verified that I am speaking with the correct person using two identifiers.  . Location of the patient: Home . Location of the provider: Office Only the patient (+/- their family members at pt's discretion) and myself were participating in the encounter    - This visit type was conducted due to national recommendations for restrictions regarding the COVID-19 Pandemic (e.g. social distancing) in an effort to limit this patient's exposure and mitigate transmission in our community.  This format is felt to be most appropriate for this patient at this time.   - The patient did have access to video technology today     - No physical exam could be performed with this format, beyond that communicated to [place] by the patient/ family members as noted.   - Additionally my office staff/ schedulers discussed with the patient that there may be a monetary charge related to this service, depending on patient's medical insurance.   The patient expressed understanding, and agreed to proceed.      History of Present Illness:     -She is not sure of her insurance has silver sneakers.  -For her knee arthritis she has been going to Dr. Gardenia Phlegm for injections the last one was on 06/24/2018.  She would be a candidate for viscosupplement injections in the future and she was told to follow-up in about 10 weeks.    -Vitamin D: Was last checked 6 months ago and was in normal limits at 51.9.  -Hyperlipidemia: Patient's LDL was 160.  -Prediabetes-patient's last A1c 6 months ago was 6.0 and 6 months prior to that, was 6.0.   He has not changed her eating/dietary habits and also has not changed her exercise levels.  Her weight has remained stable  Diet-controlled hypertension: Patient has not been monitoring her  blood pressure ever since I had her do it regularly and it was within normal limits.  She denies any dizziness, heart palpitations, chest pressure etc. today.  She denies any headaches or visual changes.    -Bilateral hip weakness generalized OA and weakness:    Patient denies any of her spinal stenosis symptoms such as a burning sensation from her knees to her ankles that she has had in the past.. Patient is seeing physical therapy about once every 2 weeks.    Going to PT at her "Neurologist's place" for lower extremity weakness.     Just restarted every other week since they were not seeing patients bitterly during March till now due to [place]. -Today she is still frustrated that she feels she is too weak.  Occasionally has difficulty getting up from a seated position -She has not been regularly doing her exercises in between her physical therapy appointments -Her daughter [name] has questions about getting her something to help her get off the toilet..    Impression and Recommendations:    1. Essential hypertension- diet controlled   2. Hyperlipidemia, unspecified hyperlipidemia type- diet controlled   3. Prediabetes   4. History of vitamin D deficiency   5. Weakness of both hips   6. Generalized OA-    especially knees-see sports med    Hypertension: Asked patient to please check her blood pressure at home at least 1-2 times per week.  She does have a nice  monitor.  Continue to diet and exercise control.  Hyperlipidemia: Advised patient to follow heart healthy diet.  Patient LDL was 166 months ago.  We will check yearly.  Asked patient to increase her fish oil to at least 2 to 4 g daily.  Vies to walk 30 minutes 5 days/week or more.  Start off slowly and gradually work her way up  Prediabetes:   A1c 6 months ago was 6 prior to that was 6 as well.  It is been stable.  Patient is not checking her blood sugars etc.  Her weight diet and exercise level has been stable with no change from prior.   We will recheck this in 1 year  Vitamin D deficiency: Stable, continue current supplement dose as last check was 51 6 months ago  Weakness/generalized osteoarthritis:  Advised patient to join something like Silver sneakers program and more importantly continue to do her home physical therapy exercises on a regular basis.  Advised also to get back into her physical therapy appointments if they are seeing patients now.  Advised to do exercises and strengthening in areas where she feels weak.  Advised to discuss this more in depth with her physical therapist so they can tailor strengthening exercises/activity specific for her -We will continue with Gardenia Phlegm for treatment of OA of her knees.  Visco supplementation would likely be the next step  - As part of my medical decision making, I reviewed the following data within the [place] History obtained from pt /family, CMA notes reviewed and incorporated if applicable, Labs reviewed, Radiograph/ tests reviewed if applicable and OV notes from prior OV's with me, as well as other specialists she/he has seen since seeing me last, were all reviewed and used in my medical decision making process today.   - Additionally, discussion had with patient regarding txmnt plan, their biases about that plan etc were used in my medical decision making today.   - The patient agreed with the plan and demonstrated an understanding of the instructions.   No barriers to understanding were identified.   - Red flag symptoms and signs discussed in detail.  Patient expressed understanding regarding what to do in case of emergency\ urgent symptoms.  The patient was advised to call back or seek an in-person evaluation if the symptoms worsen or if the condition fails to improve as anticipated.   Return for Follow-up 3 months for Medicare wellness visit, 6 months for labs-would be 1 year from last check.    I provided 22 minutes of non-face-to-face time during  this encounter,with over 50% of the time in direct counseling on patients medical conditions/ medical concerns.  Additional time was spent with charting and coordination of care after the actual visit commenced.   Note:  This note was prepared with assistance of Dragon voice recognition software. Occasional wrong-word or sound-a-like substitutions may have occurred due to the inherent limitations of voice recognition software.  [name], DO     Patient Care Team    Relationship Specialty Notifications Start End  [name], DO PCP - General Family Medicine  06/27/17   [name], MD Consulting Physician Cardiology  06/27/17   Melvenia Beam, MD Consulting Physician Neurology  06/27/17   Sydnee Levans, MD Referring Physician Dermatology  06/27/17   Rehabilitation, [place]    06/27/17     -Vitals obtained; medications/ allergies reconciled;  personal medical, social, Sx etc.histories were updated by CMA, reviewed by me and  are reflected in chart  Patient Active Problem List   Diagnosis Date Noted  . Prediabetes 08/22/2017    Priority: High  . White coat syndrome without diagnosis of hypertension 08/22/2017    Priority: High  . Anxiousness 06/27/2017    Priority: High  . Essential hypertension 08/14/2016    Priority: High  . h/o Hyperlipidemia     Priority: High  . Mixed hyperlipidemia 08/22/2017    Priority: Medium  . Other fatigue 06/27/2017    Priority: Medium  . 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 Generalized OA-    especially knees-see sports med 07/29/2018  . Right knee pain 05/07/2018  . Degenerative joint disease of knee, right 03/19/2018  . Recurrent R knee pain- acute on chronic after twisting injury 03/02/18 03/12/2018  . Osteoarthritis of knee 09/18/2017  . History of vitamin D deficiency 08/22/2017  . Lumbar facet arthropathy 06/27/2017  . History of hysterectomy for benign disease  06/27/2017  . History of dizziness 06/27/2017  . Overweight (BMI 25.0-29.9) 06/27/2017  . Weakness of both hips 04/18/2017  . Abnormality of gait 04/18/2017  . Spinal stenosis of lumbar region 04/18/2017  . Pancolitis ([place]) 08/14/2016  . Leukocytes in urine 08/14/2016     Current Meds  Medication Sig  . aspirin 81 MG tablet Take 325 mg by mouth daily.   . Biotin 10000 MCG TABS Take by mouth. 1/2 tablet daily  . cholecalciferol (VITAMIN D) 25 MCG (1000 UT) tablet Take 1,000 Units by mouth daily.  . Coenzyme Q10 (COQ-10) 100 MG CAPS Take 1 tablet by mouth daily.  . Cyanocobalamin (VITAMIN B 12 PO) Take by mouth. 5, 000 mcg daily  . Misc Natural Products (TART CHERRY ADVANCED) CAPS Take by mouth. 2500mg  daily  . Multiple Vitamins-Minerals (PRESERVISION AREDS 2 PO) Take by mouth daily.  . Omega-3 Fatty Acids (OMEGA-3 FISH OIL PO) Take 1 tablet by mouth daily. 500mg   . Polyethylene Glycol 3350 (MIRALAX PO) Take by mouth daily.  . Turmeric 500 MG CAPS Take by mouth daily.  Marland Kitchen UNABLE TO FIND Take 2 tablets by mouth daily. Med Name: Cognium     Allergies  Allergen Reactions  . Flagyl [Metronidazole] Rash    Possible cause of rash      ROS:  See above HPI for pertinent positives and negatives   Objective:   Weight 165 lb (74.8 kg).  (if some vitals are omitted, this means that patient was UNABLE to obtain them even though they were asked to get them prior to OV today.  They were asked to call us at their earliest convenience with these once obtained.)  General: A & O * 3; visually in no acute distress; in usual state of health.  Skin: Visible skin appears normal and pt's usual skin color HEENT:  EOMI, head is normocephalic and atraumatic.  Sclera are anicteric. Neck has a good range of motion.  Lips are noncyanotic Chest: normal chest excursion and movement Respiratory: speaking in full sentences, no conversational dyspnea; no use of accessory muscles Psych: insight good, mood-  appears full

## 2018-08-05 ENCOUNTER — Other Ambulatory Visit: Payer: Self-pay

## 2018-08-05 ENCOUNTER — Ambulatory Visit: Payer: Medicare Other | Attending: Family Medicine | Admitting: Physical Therapy

## 2018-08-05 ENCOUNTER — Encounter: Payer: Self-pay | Admitting: Physical Therapy

## 2018-08-05 DIAGNOSIS — R2681 Unsteadiness on feet: Secondary | ICD-10-CM | POA: Diagnosis not present

## 2018-08-05 DIAGNOSIS — R29818 Other symptoms and signs involving the nervous system: Secondary | ICD-10-CM

## 2018-08-05 DIAGNOSIS — R2689 Other abnormalities of gait and mobility: Secondary | ICD-10-CM

## 2018-08-05 NOTE — Therapy (Signed)
[facility] [address] [place], [place], [address] Phone: [phone]   Fax:  [phone]  Physical Therapy Treatment  Patient Details  Name: Jamie [name] MRN: [id number] Date of Birth: 10-Apr-1934 Referring Provider (PT): Lyndal Pulley, DO   Encounter Date: 08/05/2018  CLINIC OPERATION CHANGES: Outpatient Neuro Rehab is open at lower capacity following universal masking, social distancing, and patient screening.  The patient's COVID risk of complications score is 4.   PT End of Session - 08/05/18 1549    Visit Number  3    Number of Visits  17    Date for PT Re-Evaluation  09/17/18   per renewal 07/19/2018   Authorization Type  [employer] Medicare $20 copay    Authorization Time Period       PT Start Time  1501    PT Stop Time  7253    PT Time Calculation (min)  43 min    Activity Tolerance  Patient tolerated treatment well    Behavior During Therapy  WFL for tasks assessed/performed;Flat affect       Past Medical History:  Diagnosis Date  . Hypertension    only one time  . Right knee pain   . Spinal stenosis of lumbar region     Past Surgical History:  Procedure Laterality Date  . ABDOMINAL HYSTERECTOMY    . HYSTERECTOMY ABDOMINAL WITH SALPINGECTOMY      There were no vitals filed for this visit.  Subjective Assessment - 08/05/18 1505    Subjective  Today's a good day.  Have already been able to do my exercises.  Some days not as good as today.  Denies any falls.    Pertinent History  HTN, rt knee pain, spinal stenosis, dementia, rt knee injection    Patient Stated Goals  "Main problem would be getting up from chair without help" and car transfers    Currently in Pain?  No/denies                       [facility] Adult PT Treatment/Exercise - 08/05/18 0001      Transfers   Transfers  Sit to Stand;Stand to Sit    Sit to Stand  With upper extremity assist;With armrests;5: Supervision;From  chair/3-in-1    Sit to Stand Details  Verbal cues for sequencing;Verbal cues for technique;Tactile cues for initiation    Stand to Sit  5: Supervision;With upper extremity assist;To chair/3-in-1    Number of Reps  2 sets;Other reps (comment)   5 reps; from mat, from chair   Transfer Cueing  Cues for increased forward lean, use of hands to boost up to stand and for upright posture upon standing      Ambulation/Gait   Ambulation/Gait  Yes    Ambulation/Gait Assistance  6: Modified independent (Device/Increase time)    Ambulation Distance (Feet)  115 Feet   x 2   Assistive device  None    Gait Pattern  Step-through pattern;Decreased arm swing - right;Decreased arm swing - left;Decreased step length - right;Decreased step length - left;Decreased dorsiflexion - right;Decreased dorsiflexion - left;Decreased weight shift to right;Decreased weight shift to left;Left foot flat;Shuffle;Decreased trunk rotation;Trunk flexed;Poor foot clearance - left;Poor foot clearance - right;Right foot flat    Gait velocity  32.8/10.5= 3.12 ft/sec    Gait Comments  Cues for increased arm swing      Standardized Balance Assessment   Standardized Balance Assessment  Timed Up and Go  Test      Timed Up and Go Test   TUG  Normal TUG;Cognitive TUG    Normal TUG (seconds)  15.38    Cognitive TUG (seconds)  14.72   with mis-counting     High Level Balance   High Level Balance Comments  Forward step and weightshift x 10 reps, then back step and weightshift x 10 reps, with 1 UE support.      Self-Care   Self-Care  Other Self-Care Comments    Other Self-Care Comments   Discussed fall prevention education with patient.  Provided handout for fall prevention.  Pt would like to d/c today due to transportation issues.  Advised patient to contact MD if needed to return to PT in regards to changing balance, slower mobility, falls.        Reviewed PWR! Moves in standing:   PWR! Up for improved posture x 10 reps (cues for  technique)  PWR! Rock for improved weighshifting x 10 reps (cues for technique)  PWR! Step for improved step initiation x 10 reps (cues for technique, deliberate step)       PT Education - 08/05/18 1548    Education Details  Fall prevention education provided; situations that would warrant pt's return to PT    Person(s) Educated  Patient    Methods  Explanation;Handout    Comprehension  Verbalized understanding       PT Short Term Goals - 08/05/18 1549      PT SHORT TERM GOAL #1   Title  Patient independent with basic HEP (Target all STGs: 4 weeks, 08/16/2018)    Time  4    Period  Weeks    Status  Achieved      PT SHORT TERM GOAL #2   Title  Patient will ambulate 200 ft with supervision over unlevel outdoor surfaces (primarily grass to allow for return to mowing her lawn)    Time  4    Period  Weeks    Status  Unable to assess   due to time constraints     PT SHORT TERM GOAL #3   Title  Patient will verbalize understanding of fall prevention materials provided    Time  4    Period  Weeks    Status  Partially Met   fall prevention education provided     PT SHORT TERM GOAL #4   Title  Patient will demonstrate modified independent floor to chair/bed transfer, in case of fall at home.     Time  4    Period  Weeks    Status  Unable to assess   due to time constraints       PT Long Term Goals - 08/05/18 1551      PT LONG TERM GOAL #1   Title  Patient will be independent with updated HEP, including an aerobic/cardiovascular component (Target all LTGs: 8 weeks, 09/13/2018)    Time  8    Period  Weeks    Status  Not Met      PT LONG TERM GOAL #2   Title  Patient will improve gait velocity to >= 2.62 ft/sec towards lesser fall risk in the community.     Baseline  05/09/18 2.09 ft/sec    Time  8    Status  Achieved      PT LONG TERM GOAL #3   Title  Patient will improve TUG to <=13.5 seconds demonstrating a lesser fall risk score  Baseline  05/09/18 17.41 sec     Time  8    Period  Weeks    Status  Not Met      PT LONG TERM GOAL #4   Title  Patient will improve difference between TUG normal and TUG cognitive to <10% (indicative of lesser fall risk)     Baseline  05/09/18 13% difference    Time  8    Period  Weeks    Status  Not Met            Plan - 08/05/18 1551    Clinical Impression Statement  Initial focus of today's session on review of sit to stand technique and PWR! Moves in standing.  Pt requires cues for correct technqiue of sit<>stand (due to posterior push and lean) and for exercises.  Mid-way through today's session, pt requests for today to be last visit due to transportation issues with her son.  Educated patient on fall prevention education and situations that would warrant return to therapy.  She has partially met STG 1, 2.  STG 3 and 4 unable to be assessed due to time constraints of eval.  Pt has not met 4 of 5 LTGs, with LTG 3 for gait velocity being met.  See d/c summary.    Personal Factors and Comorbidities  Comorbidity 3+    Comorbidities  rt knee pain, spinal stenosis, dementia, HTN    Examination-Activity Limitations  Bend;Stairs;Locomotion Level;Transfers    Examination-Participation Restrictions  Cleaning;Community Activity;Driving;Shop;Yard Work    [employer]  Evolving/Moderate complexity    Rehab Potential  Good    PT Frequency  2x / week    PT Duration  8 weeks   per renewal 07/19/2018-pt may only come 2 visits per her request   PT Treatment/Interventions  ADLs/Self Care Home Management;DME Instruction;Gait training;Stair training;Functional mobility training;Therapeutic activities;Therapeutic exercise;Balance training;Patient/family education;Cognitive remediation;Neuromuscular re-education;Passive range of motion    PT Next Visit Plan  Discharge this visit    Consulted and Agree with Plan of Care  Patient    Family Member Consulted          Patient will benefit from skilled  therapeutic intervention in order to improve the following deficits and impairments:  Abnormal gait, Decreased balance, Decreased coordination, Decreased cognition, Decreased knowledge of use of DME, Decreased mobility, Decreased range of motion, Decreased safety awareness, Difficulty walking, Decreased strength, Impaired perceived functional ability, Impaired flexibility, Postural dysfunction  Visit Diagnosis: Unsteadiness on feet  Other abnormalities of gait and mobility  Other symptoms and signs involving the nervous system     Problem List Patient Active Problem List   Diagnosis Date Noted  . Generalized OA-    especially knees-see sports med 07/29/2018  . Right knee pain 05/07/2018  . Degenerative joint disease of knee, right 03/19/2018  . Recurrent R knee pain- acute on chronic after twisting injury 03/02/18 03/12/2018  . Osteoarthritis of knee 09/18/2017  . Mixed hyperlipidemia 08/22/2017  . Prediabetes 08/22/2017  . White coat syndrome without diagnosis of hypertension 08/22/2017  . History of vitamin D deficiency 08/22/2017  . Lumbar facet arthropathy 06/27/2017  . History of hysterectomy for benign disease 06/27/2017  . Anxiousness 06/27/2017  . History of dizziness 06/27/2017  . Other fatigue 06/27/2017  . Overweight (BMI 25.0-29.9) 06/27/2017  . Weakness of both hips 04/18/2017  . Abnormality of gait 04/18/2017  . Spinal stenosis of lumbar region 04/18/2017  . Parkinsonian-like syndrome [facility])- sees Neuro- Dr Jaynee Eagles 03/13/2017  . Pancolitis (  [place]) 08/14/2016  . Essential hypertension 08/14/2016  . Leukocytes in urine 08/14/2016  . h/o Hyperlipidemia     Shaquanna Lycan W. 08/05/2018, 3:55 PM Frazier Butt., PT  [facility] [address] [place], [place], [address] Phone: [phone]   Fax:  [phone]  Name: Jamie [name] MRN: [id number] Date of Birth: 1934/09/15   PHYSICAL THERAPY DISCHARGE  SUMMARY  Visits from Start of Care: 3  Current functional level related to goals / functional outcomes: PT Short Term Goals - 08/05/18 1549      PT SHORT TERM GOAL #1   Title  Patient independent with basic HEP (Target all STGs: 4 weeks, 08/16/2018)    Time  4    Period  Weeks    Status  Achieved      PT SHORT TERM GOAL #2   Title  Patient will ambulate 200 ft with supervision over unlevel outdoor surfaces (primarily grass to allow for return to mowing her lawn)    Time  4    Period  Weeks    Status  Unable to assess   due to time constraints     PT SHORT TERM GOAL #3   Title  Patient will verbalize understanding of fall prevention materials provided    Time  4    Period  Weeks    Status  Partially Met   fall prevention education provided     PT SHORT TERM GOAL #4   Title  Patient will demonstrate modified independent floor to chair/bed transfer, in case of fall at home.     Time  4    Period  Weeks    Status  Unable to assess   due to time constraints     Pt has met 1 STG  And 1 LTG.  Unable to fully address due to restrictions due to COVID-19 as well as pt requesting to d/c at 08/05/2018 visit.   Remaining deficits: Posture, balance, transfers, fall risk   Education / Equipment: Educated in [employer], fall prevention education  Plan: Patient agrees to discharge.  Patient goals were not met. Patient is being discharged due to the patient's request.  ?????         Mady Haagensen, PT 08/05/18 3:58 PM Phone: [phone] Fax: [phone]

## 2018-08-08 ENCOUNTER — Ambulatory Visit: Payer: Medicare Other | Admitting: Physical Therapy

## 2018-08-09 LAB — HM MAMMOGRAPHY

## 2018-08-21 ENCOUNTER — Ambulatory Visit: Payer: Medicare Other | Admitting: Physical Therapy

## 2018-08-22 ENCOUNTER — Ambulatory Visit: Payer: Medicare Other | Admitting: Physical Therapy

## 2018-08-28 ENCOUNTER — Ambulatory Visit (INDEPENDENT_AMBULATORY_CARE_PROVIDER_SITE_OTHER): Payer: Medicare Other | Admitting: Family Medicine

## 2018-08-28 ENCOUNTER — Other Ambulatory Visit: Payer: Self-pay

## 2018-08-28 ENCOUNTER — Telehealth: Payer: Self-pay | Admitting: Neurology

## 2018-08-28 ENCOUNTER — Encounter: Payer: Self-pay | Admitting: Family Medicine

## 2018-08-28 VITALS — BP 160/84 | HR 76 | Temp 98.4°F | Ht 66.0 in | Wt 168.2 lb

## 2018-08-28 DIAGNOSIS — M159 Polyosteoarthritis, unspecified: Secondary | ICD-10-CM | POA: Diagnosis not present

## 2018-08-28 DIAGNOSIS — R29898 Other symptoms and signs involving the musculoskeletal system: Secondary | ICD-10-CM

## 2018-08-28 DIAGNOSIS — G20 Parkinson's disease: Secondary | ICD-10-CM

## 2018-08-28 DIAGNOSIS — F419 Anxiety disorder, unspecified: Secondary | ICD-10-CM

## 2018-08-28 DIAGNOSIS — Z719 Counseling, unspecified: Secondary | ICD-10-CM

## 2018-08-28 NOTE — Telephone Encounter (Signed)
Jamie [name], can you call patient and give her a 330 or 4pm appointment one day? I need to see her but I need more time, either an hour or at the end of the day. Thank you!

## 2018-08-28 NOTE — Patient Instructions (Addendum)
I am not seeing you have Parkinson's disease per se but understanding practices may help you understand the disease process and help you formulate questions for Dr. Jaynee Eagles in future    Parkinson's Disease Parkinson's disease is a type of movement disorder. It is a long-term condition that gets worse over time (is progressive). Each person with Parkinson's disease is affected differently. This condition limits your ability to control movements and move your body normally. The condition can range from mild to severe. Parkinson's disease tends to get worse slowly over several years. What are the causes? Parkinson's disease results from a loss of brain cells (neurons) that make a brain chemical called dopamine. Dopamine is needed to control movement. As the condition gets worse, neurons make less dopamine. This makes it hard to move or control your movements. The exact cause of the loss of neurons and why they make less dopamine is not known. Factors related to genes and the environment may contribute to the cause of Parkinson's disease. What increases the risk? The following factors may make you more likely to develop this condition:  Being female.  Being age 82 or older.  Having a family history of Parkinson's disease.  Having had a traumatic brain injury.  Having experienced depression.  Having been exposed to toxins, such as pesticides. What are the signs or symptoms? Symptoms of this condition can vary. The main symptoms are related to movement. These include:  A tremor or shaking while you are resting that you cannot control.  Stiffness in your neck, arms, and legs (rigidity).  Slowing of movement. You may lose facial expressions and have trouble making small movements that are needed to button clothing or brush your teeth.  An abnormal walk. You may walk with short, shuffling steps.  Loss of balance and stability when standing. You may sway, fall backward, and have trouble making  turns. Other symptoms include:  Mental or cognitive changes including depression, anxiety, having false beliefs (delusions), or seeing, hearing, or feeling things that do not exist (hallucinations).  Trouble speaking or swallowing.  Changes in bowel or bladder functions including constipation, having to go urgently or frequently, or not being able to control your bowel or bladder.  Changes in sleep habits or trouble sleeping. Parkinson's disease may be graded by severity of your condition as mild, moderate, or advanced. Parkinson's disease progression is different for everyone. You may not progress to the advanced stage.  Mild Parkinson's disease involves: ? Movement problems that do not affect daily activities. ? Movement problems on one side of the body.  Moderate Parkinson's disease involves: ? Movement problems on both sides of the body. ? Slowing of movement. ? Coordination and balance problems.  Advanced Parkinson's disease involves: ? Extreme difficulty walking. ? Inability to live alone safely. ? Signs of dementia, such as having trouble remembering things, doing daily tasks such as getting dressed, and problem solving. How is this diagnosed? This condition is diagnosed by a specialist. A diagnosis may be made based on symptoms, your medical history, and a physical exam. You may also have brain imaging tests to check for a loss of dopamine-producing areas of the brain. How is this treated? There is no cure for Parkinson's disease. Treatment focuses on managing your symptoms. Treatment may include:  Medicines. Everyone responds to medicines differently. Your response may change over time. Work with your health care provider to find the best medicines for you.  Speech, occupational, and physical therapy.  Deep brain stimulation surgery to  reduce tremors and other involuntary movements. Follow these instructions at home: Medicines  Take over-the-counter and prescription  medicines only as told by your health care provider.  Avoid taking medicines that can affect thinking, such as pain or sleeping medicines. Eating and drinking  Follow instructions from your health care provider about eating or drinking restrictions.  Do not drink alcohol. Activity  Talk with your health care provider about if it is safe for you to drive.  Do exercises as told by your health care provider or physical therapist. Lifestyle      Install grab bars and railings in your home to prevent falls.  Do not use any products that contain nicotine or tobacco, such as cigarettes, e-cigarettes, and chewing tobacco. If you need help quitting, ask your health care provider.  Consider joining a support group for people with Parkinson's disease. General instructions  Work with your health care provider to determine what you need help with and what your safety needs are.  Keep all follow-up visits as told by your health care provider, including any visits with a physical therapist, speech therapist, or occupational therapist. This is important. Contact a health care provider if:  Medicines do not help your symptoms.  You are unsteady or have fallen at home.  You need more support to function well at home.  You have trouble swallowing.  You have severe constipation.  You are having problems with side effects from your medicines.  You feel confused, anxious, or depressed. Get help right away if you:  Are injured after a fall.  See or hear things that are not real.  Cannot swallow without choking.  Have chest pain or trouble breathing.  Do not feel safe at home.  Have thoughts about hurting yourself or others. If you ever feel like you may hurt yourself or others, or have thoughts about taking your own life, get help right away. You can go to your nearest emergency department or call:  Your local emergency services (911 in the [place]  A suicide crisis helpline, such  as the [place] at (360)438-5784. This is open 24 hours a day. Summary  Parkinson's disease is a long-term condition that gets worse over time. This condition limits your ability to control your movements and move your body normally.  There is no cure for Parkinson's disease. Treatment focuses on managing your symptoms.  Work with your health care provider to determine what you need help with and what your safety needs are.  Keep all follow-up visits as told by your health care provider, including any visits with a physical therapist, speech therapist, or occupational therapist. This is important. This information is not intended to replace advice given to you by your health care provider. Make sure you discuss any questions you have with your health care provider. Document Released: 02/04/2000 Document Revised: 04/25/2018 Document Reviewed: 04/25/2018 Elsevier Patient Education  2020 [employer].

## 2018-08-28 NOTE — Progress Notes (Signed)
Impression and Recommendations:    1. Parkinsonism, unspecified Parkinsonism type ([place])   2. Generalized OA-    especially knees-see sports med   3. Weakness of both hips   4. Anxiousness   5. Encounter for health education     1. Parkinsonism, unspecified Parkinsonism type ([place])  - extensive and long discussion addressing family and pt concerns about her current state of health -  Spent extensive time with patient and family member-Jamie [name]-educating about disease process, challenges of disease, challenges of the caregiver, challenges with progression of disease etc.  Struggles to be planning for future care.   All questions were answered. -  Sent letter to Dr Jaynee Eagles of Neuro personally and expressed patient's, as well as family's concerns. -I recommended to family that they see Dr. Jaynee Eagles again and ask their specific questions about what exact disease does mom have, what is the prognosis, can they expect this to progress just like Parkinson's disease, any additional medicines or treatments, could this be some other N-M disease etc. etc.   - Explained to family /patient that Parkinson's- like disease is not only a motor disorder but a complex disease with diverse neuropsychiatric complications in addition to its motor symptoms  -Explained that they can have depression, anxiety, apathy, psychosis, cognitive impairment/memory impairment, impulse control, motor skills issues and sleep disturbances.  -Further explained that as the Parkinson's- llike disease progresses, usually the neurological- including motor and psychiatric complications also become more prevalent over the course of the disease.    -Counseled patient's [name] on the above   -Extensively discussed caretaker stress / family stressors around advanced care issues  -Further explained that I am not a neurologist, and I do not diagnose or initiate treatment for patients with P-like Dx and its related neuromotor/  psychiatric conditions/ syndromes.   - Thus, I highly encouraged patient and [name] today to please contact their neurologist and make a follow-up to discuss their concerns etc.with them as well.  I explained this is a complex dx and we don;t always have all the answers as well.     Pt was interviewed and evaluated by me in the clinic today for 40++ minutes, with over 50% time spent in face to face counseling of patients various medical conditions, treatment plans of those medical conditions including medicine management and lifestyle modification, strategies to improve health and well being; and in coordination of care. SEE ABOVE TREATMENT PLAN FOR DETAILS   Expresses verbal understanding and consents to current therapy and treatment regimen.  No barriers to understanding were identified.    Please see AVS handed out to patient at the end of our visit for further patient instructions/ counseling done pertaining to today's office visit.   Return for Keep your follow-up appointment in the near future.     Note:  This note was prepared with assistance of Dragon voice recognition software. Occasional wrong-word or sound-a-like substitutions may have occurred due to the inherent limitations of voice recognition software.  [name], DO 08/30/2018 8:54 PM  --------------------------------------------------------------------------------------------------------------------------------------------------------------------------------------------------------------------------------------------    Subjective:     HPI: Jamie [name] is a 84 y.o. female who [facility] to [facility] today for issues as discussed below.   [name] Jamie [name]- here to discuss her and family's many concerns about her MIL.  Pt growing weaker in prox LE muscles.  Trouble getting pants on-  Trouble with her fine motor skills and dexterity.   Difficult to get  into bed now.   Difficulty living on own-  despite Neuro rehab- last appt was 08/05/18- which was put on hold a little bit due to covid.   Jamie [name] and pt is not quite understanding what she has as dx and they appear to not have a substantial understanding of parkinson's disease as a syndrome.      Wt Readings from Last 3 Encounters:  09/09/18 165 lb (74.8 kg)  08/28/18 168 lb 3.2 oz (76.3 kg)  07/29/18 165 lb (74.8 kg)   BP Readings from Last 3 Encounters:  09/09/18 140/82  08/28/18 (!) 160/84  06/24/18 126/86   Pulse Readings from Last 3 Encounters:  09/09/18 79  08/28/18 76  06/24/18 78   BMI Readings from Last 3 Encounters:  09/09/18 26.63 kg/m  08/28/18 27.15 kg/m  07/29/18 26.63 kg/m     Patient Care Team    Relationship Specialty Notifications Start End  [name], DO PCP - General Family Medicine  06/27/17   [name], MD Consulting Physician Cardiology  06/27/17   Melvenia Beam, MD Consulting Physician Neurology  06/27/17   Sydnee Levans, MD Referring Physician Dermatology  06/27/17   Rehabilitation, [place]    06/27/17      Patient Active Problem List   Diagnosis Date Noted  . Prediabetes 08/22/2017    Priority: High  . White coat syndrome without diagnosis of hypertension 08/22/2017    Priority: High  . Anxiousness 06/27/2017    Priority: High  . Essential hypertension 08/14/2016    Priority: High  . h/o Hyperlipidemia     Priority: High  . Mixed hyperlipidemia 08/22/2017    Priority: Medium  . Other fatigue 06/27/2017    Priority: Medium  . Parkinsonian-like syndrome [facility])- sees Neuro- Dr Jaynee Eagles 03/13/2017    Priority: Medium  . Generalized OA-    especially knees-see sports med 07/29/2018  . Right knee pain 05/07/2018  . Degenerative joint disease of knee, right 03/19/2018  . Recurrent R knee pain- acute on chronic after twisting injury 03/02/18 03/12/2018  . Osteoarthritis of knee 09/18/2017  . History of vitamin D deficiency 08/22/2017  . Lumbar facet arthropathy  06/27/2017  . History of hysterectomy for benign disease 06/27/2017  . History of dizziness 06/27/2017  . Overweight (BMI 25.0-29.9) 06/27/2017  . Weakness of both hips 04/18/2017  . Abnormality of gait 04/18/2017  . Spinal stenosis of lumbar region 04/18/2017  . Pancolitis ([place]) 08/14/2016  . Leukocytes in urine 08/14/2016    Past Medical history, Surgical history, Family history, Social history, Allergies and Medications have been entered into the medical record, reviewed and changed as needed.    Current Meds  Medication Sig  . aspirin 81 MG tablet Take 325 mg by mouth daily.   . Biotin 10000 MCG TABS Take by mouth. 1/2 tablet daily  . cholecalciferol (VITAMIN D) 25 MCG (1000 UT) tablet Take 1,000 Units by mouth daily.  . Coenzyme Q10 (COQ-10) 100 MG CAPS Take 1 tablet by mouth daily.  . Cyanocobalamin (VITAMIN B 12 PO) Take by mouth. 5, 000 mcg daily  . Misc Natural Products (TART CHERRY ADVANCED) CAPS Take by mouth. 2500mg  daily  . Multiple Vitamins-Minerals (PRESERVISION AREDS 2 PO) Take by mouth daily.  . Omega-3 Fatty Acids (OMEGA-3 FISH OIL PO) Take 1 tablet by mouth daily. 500mg   . Polyethylene Glycol 3350 (MIRALAX PO) Take by mouth daily.  . Turmeric 500 MG CAPS Take by mouth daily.  Marland Kitchen UNABLE TO FIND Take  2 tablets by mouth daily. Med Name: Cognium    Allergies:  Allergies  Allergen Reactions  . Flagyl [Metronidazole] Rash    Possible cause of rash      Review of Systems:  A fourteen system review of systems was performed and found to be positive as per HPI.   Objective:   Blood pressure (!) 160/84, pulse 76, temperature 98.4 F (36.9 C), height 5\' 6"  (1.676 m), weight 168 lb 3.2 oz (76.3 kg), SpO2 98 %. Body mass index is 27.15 kg/m. General:  Well Developed, well nourished, appropriate for stated age.  Neuro:  Alert and oriented,  extra-ocular muscles intact  HEENT:  Normocephalic, atraumatic, neck supple, no carotid bruits appreciated  Skin:  no gross  rash, warm, pink. Cardiac:  RRR, S1 S2 Respiratory:  ECTA B/L and A/P, Not using accessory muscles, speaking in full sentences- unlabored. Vascular:  Ext warm, no cyanosis apprec.; cap RF less 2 sec. Psych:  No HI/SI, judgement and insight good, Euthymic mood. Full Affect.

## 2018-08-29 NOTE — Telephone Encounter (Signed)
I spoke with Dr. Jaynee Eagles. Will leave appt as is on 7/28.

## 2018-09-09 ENCOUNTER — Ambulatory Visit (INDEPENDENT_AMBULATORY_CARE_PROVIDER_SITE_OTHER)
Admission: RE | Admit: 2018-09-09 | Discharge: 2018-09-09 | Disposition: A | Discharge status: Home / Self Care | Payer: Medicare Other | Source: Ambulatory Visit | Attending: Family Medicine | Admitting: Family Medicine

## 2018-09-09 ENCOUNTER — Ambulatory Visit: Payer: Medicare Other | Admitting: Family Medicine

## 2018-09-09 ENCOUNTER — Other Ambulatory Visit: Payer: Self-pay

## 2018-09-09 ENCOUNTER — Encounter: Payer: Self-pay | Admitting: Family Medicine

## 2018-09-09 VITALS — BP 140/82 | HR 79 | Ht 66.0 in | Wt 165.0 lb

## 2018-09-09 DIAGNOSIS — M545 Low back pain, unspecified: Secondary | ICD-10-CM

## 2018-09-09 DIAGNOSIS — M47816 Spondylosis without myelopathy or radiculopathy, lumbar region: Secondary | ICD-10-CM | POA: Diagnosis not present

## 2018-09-09 DIAGNOSIS — M1711 Unilateral primary osteoarthritis, right knee: Secondary | ICD-10-CM

## 2018-09-09 DIAGNOSIS — R531 Weakness: Secondary | ICD-10-CM

## 2018-09-09 DIAGNOSIS — M48061 Spinal stenosis, lumbar region without neurogenic claudication: Secondary | ICD-10-CM

## 2018-09-09 DIAGNOSIS — G20 Parkinson's disease: Secondary | ICD-10-CM

## 2018-09-09 NOTE — Assessment & Plan Note (Signed)
X-rays ordered today to further evaluate.  Possible spinal stenosis causing some of the weakness in the lower extremities.  MRI may be necessary.

## 2018-09-09 NOTE — Assessment & Plan Note (Signed)
Repeat injection given today.  Tolerated the procedure well.  Does not want to try any of the [place] supplementation.  Concerned with the abnormality of gait and the weakness.  Differential includes a potential movement disorder such as Parkinson's is him.  Patient is set up with neurology but would like to consider another neurologist.  We will put an order and discussed with patient's primary care physician.  In addition of this encouraged her to continue the same different medications.  Has had some difficulty with certain medicines and will like to avoid gabapentin at the moment.  Do think it could be beneficial we discussed x-rays of the back today to further evaluate the history of spinal stenosis seen on CT scan in 2017.  We will consider possibility of epidurals at that time if necessary as well.  Follow-up with me for the knee in 10 weeks.  Spent  25 minutes with patient face-to-face and had greater than 50% of counseling including as described above in assessment and plan.

## 2018-09-09 NOTE — Patient Instructions (Signed)
Good to see you  Ice is your friend Tart cherry extract any dose at night We will get you in with Dr. Carles Collet See me again in 10 weeks

## 2018-09-09 NOTE — Progress Notes (Signed)
[name] [place] Jamie [name], Jamie [name] [id number] Phone: [phone] Subjective:   Jamie [name], am serving as a scribe for Dr. Hulan Saas.  I'm seeing this patient by the request  of:    CC: Right knee pain  XTK:[id number]   06/24/2018: Patient does have knee arthritis.  Given injection today.  Tolerated the procedure well.  If needed we will consider viscosupplementation patient has been approved.  Patient will follow-up with me again in 10 weeks otherwise.  Update 09/09/2018 Jamie [name] is a 84 y.o. female coming in with complaint of right knee pain. Not having a lot of pain but is having weakness. Hard to perform sit to stand.  Patient is doing formal physical therapy and has been trying to do home exercise.  Feels like she is continuing to actually make it over the course of time.     Past Medical History:  Diagnosis Date  . Hypertension    only one time  . Right knee pain   . Spinal stenosis of lumbar region    Past Surgical History:  Procedure Laterality Date  . ABDOMINAL HYSTERECTOMY    . HYSTERECTOMY ABDOMINAL WITH SALPINGECTOMY     Social History   Socioeconomic History  . Marital status: Widowed    Spouse name: Not on file  . Number of children: Not on file  . Years of education: Not on file  . Highest education level: Not on file  Occupational History  . Not on file  Social Needs  . Financial resource strain: Not on file  . Food insecurity    Worry: Not on file    Inability: Not on file  . Transportation needs    Medical: Not on file    Non-medical: Not on file  Tobacco Use  . Smoking status: Never Smoker  . Smokeless tobacco: Never Used  Substance and Sexual Activity  . Alcohol use: [name]    Alcohol/week: 0.0 standard drinks  . Drug use: [name]  . Sexual activity: Not Currently  Lifestyle  . Physical activity    Days per week: Not on file    Minutes per session: Not on file  . Stress: Not on file   Relationships  . Social Herbalist on phone: Not on file    Gets together: Not on file    Attends religious service: Not on file    Active member of club or organization: Not on file    Attends meetings of clubs or organizations: Not on file    Relationship status: Not on file  Other Topics Concern  . Not on file  Social History Narrative  . Not on file   Allergies  Allergen Reactions  . Flagyl [Metronidazole] Rash    Possible cause of rash    Family History  Problem Relation Age of Onset  . Cancer Mother   . Cancer Father        Current Outpatient Medications (Analgesics):  .  aspirin 81 MG tablet, Take 325 mg by mouth daily.   Current Outpatient Medications (Hematological):  Marland Kitchen  Cyanocobalamin (VITAMIN B 12 PO), Take by mouth. 5, 000 mcg daily  Current Outpatient Medications (Other):  .  Biotin 10000 MCG TABS, Take by mouth. 1/2 tablet daily .  cholecalciferol (VITAMIN D) 25 MCG (1000 UT) tablet, Take 1,000 Units by mouth daily. .  Coenzyme Q10 (COQ-10) 100 MG CAPS, Take 1 tablet by mouth daily. .  Misc  Natural Products (TART CHERRY ADVANCED) CAPS, Take by mouth. 2500mg  daily .  Multiple Vitamins-Minerals (PRESERVISION AREDS 2 PO), Take by mouth daily. .  Omega-3 Fatty Acids (OMEGA-3 FISH OIL PO), Take 1 tablet by mouth daily. 500mg  .  Polyethylene Glycol 3350 (MIRALAX PO), Take by mouth daily. .  Turmeric 500 MG CAPS, Take by mouth daily. Marland Kitchen  UNABLE TO FIND, Take 2 tablets by mouth daily. Med Name: Cognium    Past medical history, social, surgical and family history all reviewed in electronic medical record.  [name] pertanent information unless stated regarding to the chief complaint.   Review of Systems:  [name] headache, visual changes, nausea, vomiting, diarrhea, constipation, dizziness, abdominal pain, skin rash, fevers, chills, night sweats, weight loss, swollen lymph nodes, body aches, joint swelling, , chest pain, shortness of breath, mood changes.   Positive muscle aches  Objective  Blood pressure 140/82, pulse 79, height 5\' 6"  (1.676 m), weight 165 lb (74.8 kg), SpO2 97 %.   General: [name] apparent distress alert and oriented x3 mood and affect normal, dressed appropriately.  Patient does have a masked facies noted HEENT: Pupils equal, extraocular movements intact  Respiratory: Patient's speak in full sentences and does not appear short of breath  Cardiovascular: [name] lower extremity edema, non tender, [name] erythema  Skin: Warm dry intact with [name] signs of infection or rash on extremities or on axial skeleton.  Abdomen: Soft nontender  Neuro: Cranial nerves II through XII are intact, neurovascularly intact in all extremities with 2+ DTRs and 2+ pulses.  Lymph: [name] lymphadenopathy of posterior or anterior cervical chain or axillae bilaterally.  Gait shuffling gait.  MSK:  Non tender with full range of motion and good stability and symmetric strength of shoulders, elbows, wrist, hip and ankles bilaterally.  [name] significant tremor but hypertonicity noted especially of the lower extremity  Knee: Right valgus deformity noted. Large thigh to calf ratio.  Tender to palpation over medial and PF joint line.  ROM full in flexion and extension and lower leg rotation. instability with valgus force.  painful patellar compression. Patellar glide with moderate crepitus. Patellar and quadriceps tendons unremarkable. Hamstring and quadriceps strength is normal. Contralateral knee shows minimal arthritic changes  After informed written and verbal consent, patient was seated on exam table. Right knee was prepped with alcohol swab and utilizing anterolateral approach, patient's right knee space was injected with 4:1  marcaine 0.5%: Kenalog 40mg /dL. Patient tolerated the procedure well without immediate complications.     Impression and Recommendations:     This case required medical decision making of moderate complexity. The above documentation has been  reviewed and is accurate and complete Lyndal Pulley, DO       Note: This dictation was prepared with Dragon dictation along with smaller phrase technology. Any transcriptional errors that result from this process are unintentional.

## 2018-09-09 NOTE — Assessment & Plan Note (Signed)
Patient would like to see if potential second opinion with the parkinsonism.  Referred to Dr. Theda Sers.

## 2018-09-17 ENCOUNTER — Ambulatory Visit: Payer: Medicare Other | Admitting: Neurology

## 2018-10-11 NOTE — Progress Notes (Signed)
Jamie [name] was seen today in the movement disorders clinic for neurologic consultation at the request of Lyndal Pulley, DO.  The consultation is for the evaluation of parkinsonism (2nd opinion).  Prior records made available to me have been reviewed, including those from Dr. Jaynee Eagles.  She was seen by Dr. Jaynee Eagles first in January, 2019 for trouble walking.  While Dr. Jaynee Eagles noted that the patient was somewhat parkinsonian, she also noted that her gait abnormalities could be due to patient's multiple arthritic/orthopedic issues including right knee pain, right foot pain, hip pain, lumbar spinal stenosis and radiculopathy (pt states today that she doesn't have a lot of pain but does have arthritis).  She subsequently evaluated her with a DaTscan, which I had the opportunity to review.  This was done on April 12, 2017.  DaTscan was normal.  Patient's last MRI of the brain was done on July 31, 2016.  I reviewed this.  There was atrophy and mild small vessel disease.  Specific Symptoms:  Tremor: Yes.  , "my hands shake a little when I am eating"; never at rest.  Both hands, R more than L.  She is R hand dominant Family hx of similar:  No. Voice: no change per patient and family has not mentioned it Sleep: no trouble sleeping per pt  Vivid Dreams:  No.  Acting out dreams:  No. Wet Pillows: No. Postural symptoms:  Yes.  , trouble getting out of a chair or will go backwards but won't do that once up for a while  Falls?  No. Bradykinesia symptoms: difficulty getting out of a chair; shuffling of the feet.  "I just don't walk right"; states this was the first symptom that she noted. Loss of smell:  "its never been good" Loss of taste:  No. Urinary Incontinence:  Yes.   , wears a pad for leakage Difficulty Swallowing:  No. Handwriting, micrographia: Yes.   Trouble with ADL's:  No., but some trouble with getting on pants  Trouble buttoning clothing: No. Depression:  No. Memory changes:  Yes.   ,  trouble with names.   Hallucinations:  No.  visual distortions: Yes.  , just for the last 2-3 days N/V:  No. Lightheaded:  No.  Syncope: No. Diplopia:  No. Dyskinesia:  No. Prior exposure to reglan/antipsychotics: No.    PREVIOUS MEDICATIONS: none to date  ALLERGIES:   Allergies  Allergen Reactions  . Flagyl [Metronidazole] Rash    Possible cause of rash     CURRENT MEDICATIONS:  Current Outpatient Medications  Medication Instructions  . aspirin 325 mg, Oral, Daily  . Biotin 10000 MCG TABS Oral, 1/2 tablet daily   . cholecalciferol (VITAMIN D) 1,000 Units, Oral, Daily  . Coenzyme Q10 (COQ-10) 100 MG CAPS 1 tablet, Oral, Daily  . Cyanocobalamin (VITAMIN B 12 PO) Oral, 5, 000 mcg daily   . Glucosamine-Chondroit-Vit C-Mn (GLUCOSAMINE 1500 COMPLEX PO) Glucosamine  . Misc Natural Products (TART CHERRY ADVANCED) CAPS Oral, 2500mg  daily   . Multiple Vitamins-Minerals (PRESERVISION AREDS 2 PO) Oral, Daily  . Omega-3 Fatty Acids (OMEGA-3 FISH OIL PO) 1 tablet, Oral, Daily, 500mg   . Polyethylene Glycol 3350 (MIRALAX PO) Oral, Daily  . Turmeric 500 MG CAPS Oral, Daily  . UNABLE TO FIND 2 tablets, Oral, Daily, Med Name: Cognium     PAST MEDICAL HISTORY:   Past Medical History:  Diagnosis Date  . Hypertension    only one time  . Right knee pain   .  Spinal stenosis of lumbar region     PAST SURGICAL HISTORY:   Past Surgical History:  Procedure Laterality Date  . ABDOMINAL HYSTERECTOMY    . HYSTERECTOMY ABDOMINAL WITH SALPINGECTOMY      SOCIAL HISTORY:   Social History   Socioeconomic History  . Marital status: Widowed    Spouse name: Not on file  . Number of children: 3  . Years of education: 88  . Highest education level: High school graduate  Occupational History  . Not on file  Social Needs  . Financial resource strain: Not on file  . Food insecurity    Worry: Not on file    Inability: Not on file  . Transportation needs    Medical: Not on file     Non-medical: Not on file  Tobacco Use  . Smoking status: Never Smoker  . Smokeless tobacco: Never Used  Substance and Sexual Activity  . Alcohol use: No    Alcohol/week: 0.0 standard drinks  . Drug use: No  . Sexual activity: Not Currently  Lifestyle  . Physical activity    Days per week: Not on file    Minutes per session: Not on file  . Stress: Not on file  Relationships  . Social Herbalist on phone: Not on file    Gets together: Not on file    Attends religious service: Not on file    Active member of club or organization: Not on file    Attends meetings of clubs or organizations: Not on file    Relationship status: Not on file  . Intimate partner violence    Fear of current or ex partner: Not on file    Emotionally abused: Not on file    Physically abused: Not on file    Forced sexual activity: Not on file  Other Topics Concern  . Not on file  Social History Narrative   Pt lives alone in her 1 story home- she has 3 sons, and a high school graduate. She is right handed, she drinks coffee, and one soda a week mostly water    FAMILY HISTORY:   Family Status  Relation Name Status  . Mother  Deceased  . Father  Deceased  . Sister  Alive  . Son x3 Alive  . Sister  Deceased  . Sister  Deceased    ROS:  Review of Systems  Constitutional: Positive for malaise/fatigue.  HENT: Negative.   Eyes: Negative.   Respiratory: Negative.   Cardiovascular: Negative.   Gastrointestinal: Negative.   Genitourinary: Positive for frequency and urgency.  Musculoskeletal: Positive for back pain.  Neurological: Negative.   Endo/Heme/Allergies: Negative.     PHYSICAL EXAMINATION:    VITALS:   Vitals:   10/15/18 0843  BP: (!) 160/82  Pulse: 74  SpO2: 96%  Weight: 163 lb 9.6 oz (74.2 kg)  Height: 5\' 6"  (1.676 m)    GEN:  The patient appears stated age and is in NAD. HEENT:  Normocephalic, atraumatic.  The mucous membranes are moist. The superficial temporal  arteries are without ropiness or tenderness. CV:  RRR Lungs:  CTAB Neck/HEME:  There are no carotid bruits bilaterally.  Neurological examination:  Orientation: The patient is alert and oriented x3. Fund of knowledge is appropriate.  Recent and remote memory are intact.  Attention and concentration are normal.    Able to name objects and repeat phrases. Cranial nerves: There is good facial symmetry. There is significant facial hypomimia.  Pupils are equal round and reactive to light bilaterally. Fundoscopic exam reveals clear margins bilaterally. Extraocular muscles are intact but mild paresis of upgaze and downgaze. The visual fields are full to confrontational testing. The speech is fluent and clear. Soft palate rises symmetrically and there is no tongue deviation. Hearing is intact to conversational tone. Sensation: Sensation is intact to light and pinprick throughout (facial, trunk, extremities). Vibration is intact at the bilateral big toe. There is no extinction with double simultaneous stimulation. There is no sensory dermatomal level identified. Motor: Strength is 5/5 in the bilateral upper and lower extremities.   Shoulder shrug is equal and symmetric.  There is no pronator drift. Deep tendon reflexes: Deep tendon reflexes are 1/4 at the bilateral biceps, triceps, brachioradialis, patella and achilles. Plantar responses is upgoing on the L (striatal toe).  Movement examination: Tone: There is mod increased tone in the RUE and mild to mod in the LUE. Abnormal movements: there is rare tremor in the R thumb at rest Coordination:  There is  decremation with RAM's, with any form of RAMS, including alternating supination and pronation of the forearm, hand opening and closing, finger taps, heel taps and toe taps bilaterally Gait and Station: The patient cannot arise without using her hands.  She has no arm swing on the L and the arm is flexed with ambulation.  She is short stepped.     ASSESSMENT/PLAN:  1.  Probable Parkinson's disease, although an atypical state cannot be ruled out.  -Discussed with the patient that Parkinson's is a clinical diagnosis, and she does meet the clinical criteria for Parkinson's disease.  -Patient had a negative DaTscan a year and a half ago.  Despite this, we discussed that DaTscan can be wrong about 5% of the time.  Regardless, she meets criteria for Parkinson's.  -We discussed the diagnosis as well as pathophysiology of the disease.  We discussed treatment options as well as prognostic indicators.  Patient education was provided.  -We discussed that it used to be thought that levodopa would increase risk of melanoma but now it is believed that Parkinsons itself likely increases risk of melanoma. she is to get regular skin checks.  -Greater than 50% of the 60 minute visit was spent in counseling answering questions and talking about what to expect now as well as in the future.  We talked about medication options as well as potential future surgical options.  We talked about safety in the home.  -We decided to add carbidopa/levodopa 25/100.  1/2 tab tid x 1 wk, then 1/2 in am & noon & 1 at night for a week, then 1/2 in am &1 at noon &night for a week, then 1 po tid.  Risks, benefits, side effects and alternative therapies were discussed.  The opportunity to ask questions was given and they were answered to the best of my ability.  The patient expressed understanding and willingness to follow the outlined treatment protocols.  -Recommended physical therapy, but she wanted to hold for right now.  She did not think it helped in the past.  We will await until we get her on medication.  -We discussed community resources in the area including patient support groups and community exercise programs for PD and pt education was provided to the patient.  She is fairly homebound, and has no Internet right now.  Brought her daughter-in-law back to see if they could  help get her some Internet resources, so we could connect her with online  support groups and online exercise groups.  -Discussed with the patient that we may repeat her MRI of the brain in the future, given that she does have a striatal toe on the left.  Wants to hold for right now.  2.  Plan to see the patient back in the next 4 to 5 months, sooner should new neurologic issues arise.  This did not include the 35 min of record review which was detailed above, which was non face to face time.      Cc:  [name], DO

## 2018-10-15 ENCOUNTER — Ambulatory Visit (INDEPENDENT_AMBULATORY_CARE_PROVIDER_SITE_OTHER): Payer: Medicare Other | Admitting: Neurology

## 2018-10-15 ENCOUNTER — Other Ambulatory Visit: Payer: Self-pay

## 2018-10-15 ENCOUNTER — Encounter: Payer: Self-pay | Admitting: Neurology

## 2018-10-15 VITALS — BP 160/82 | HR 74 | Ht 66.0 in | Wt 163.6 lb

## 2018-10-15 DIAGNOSIS — G20 Parkinson's disease: Secondary | ICD-10-CM | POA: Diagnosis not present

## 2018-10-15 MED ORDER — CARBIDOPA-LEVODOPA 25-100 MG PO TABS: ORAL_TABLET | ORAL | 1 refills | Status: DC

## 2018-10-15 NOTE — Patient Instructions (Addendum)
Good to see you today!  We discussed that you meet criteria for parkinsonism and we will start medication for Parkinsons disease.  You need to exercise and if you have access to online programs, there are several online.  Start Carbidopa Levodopa as follows:  Take 1/2 tablet three times daily, at least 30 minutes before meals, for one week  Then take 1/2 tablet in the morning, 1/2 tablet in the afternoon, 1 tablet in the evening, at least 30 minutes before meals, for one week  Then take 1/2 tablet in the morning, 1 tablet in the afternoon, 1 tablet in the evening, at least 30 minutes before meals, for one week  Then take 1 tablet three times daily, at 6am/11am/4pm   As a reminder, carbidopa/levodopa can be taken at the same time as a carbohydrate, but we like to have you take your pill either 30 minutes before a protein source or 1 hour after as protein can interfere with carbidopa/levodopa absorption.

## 2018-10-29 ENCOUNTER — Ambulatory Visit: Payer: Medicare Other | Admitting: Family Medicine

## 2018-11-12 ENCOUNTER — Telehealth: Payer: Self-pay | Admitting: Family Medicine

## 2018-11-12 NOTE — Telephone Encounter (Signed)
Pt's daughter called wanted to spl w/ provider about including Rx for foot fungus Rx (originally prescribed by prior PCP/ Not Dr. Elisha Ponder upcoming MWE could not include request due to Medicare rules but scheduled pt for a Telehealth w/ Dr. Jenetta Downer to examine feet for fungus & prescribe Rx (Ciclopirox Cream 0.77) as requested by pt. --Forwarding fyi to medical assistant re: upcoming 10/2 appt.  --glh

## 2018-11-18 ENCOUNTER — Ambulatory Visit: Payer: Medicare Other | Admitting: Family Medicine

## 2018-11-19 ENCOUNTER — Other Ambulatory Visit: Payer: Self-pay

## 2018-11-19 ENCOUNTER — Ambulatory Visit: Payer: Medicare Other | Admitting: Family Medicine

## 2018-11-22 ENCOUNTER — Other Ambulatory Visit: Payer: Self-pay

## 2018-11-22 ENCOUNTER — Encounter: Payer: Self-pay | Admitting: Family Medicine

## 2018-11-22 ENCOUNTER — Ambulatory Visit (INDEPENDENT_AMBULATORY_CARE_PROVIDER_SITE_OTHER): Payer: Medicare Other | Admitting: Family Medicine

## 2018-11-22 VITALS — Ht 66.0 in | Wt 163.0 lb

## 2018-11-22 DIAGNOSIS — E782 Mixed hyperlipidemia: Secondary | ICD-10-CM

## 2018-11-22 DIAGNOSIS — G20C Parkinsonism, unspecified: Secondary | ICD-10-CM

## 2018-11-22 DIAGNOSIS — R030 Elevated blood-pressure reading, without diagnosis of hypertension: Secondary | ICD-10-CM | POA: Diagnosis not present

## 2018-11-22 DIAGNOSIS — R5383 Other fatigue: Secondary | ICD-10-CM | POA: Diagnosis not present

## 2018-11-22 DIAGNOSIS — G20 Parkinson's disease: Secondary | ICD-10-CM

## 2018-11-22 DIAGNOSIS — R7303 Prediabetes: Secondary | ICD-10-CM | POA: Diagnosis not present

## 2018-11-22 DIAGNOSIS — F419 Anxiety disorder, unspecified: Secondary | ICD-10-CM

## 2018-11-22 DIAGNOSIS — Z8639 Personal history of other endocrine, nutritional and metabolic disease: Secondary | ICD-10-CM

## 2018-11-22 NOTE — Progress Notes (Signed)
Virtual / live video office visit note for [employer], D.O- Primary Care Physician at [facility]   I connected with current patient today and beyond visually recognizing the correct individual, I verified that I am speaking with the correct person using two identifiers.  . Location of the patient: Home . Location of the provider: Office Only the patient (+/- their family members at pt's discretion) and myself were participating in the encounter    - This visit type was conducted due to national recommendations for restrictions regarding the COVID-19 Pandemic (e.g. social distancing) in an effort to limit this patient's exposure and mitigate transmission in our community.  This format is felt to be most appropriate for this patient at this time.   - The patient did have access to video technology today  - No physical exam could be performed with this format, beyond that communicated to [place] by the patient/ family members as noted.   - Additionally my office staff/ schedulers discussed with the patient that there may be a monetary charge related to this service, depending on patient's medical insurance.   The patient expressed understanding, and agreed to proceed.      History of Present Illness:  Patient and family confirm they are following prudent practices during COVID.  Patient has been trying to exercise for a half an hour daily, and family member states "ten times better with the medications than she was before."  Patient states "basically I feel good; certain days I really feel good and that's when I get all my work done."  Says "basically I have been doing real good.  I have some problems with right now at night, I have hot flashes real bad, and my blood pressure has gone up."  Foot fungus: Had an ointment from previous PCP.  Family member states that they need the patient's foot to be analyzed and further treatment next OV.  BP at home: Family member says "just  started taking some of it; on Thursday it was 181/83, and in the afternoon was 153/63; another morning was 179/93, and afternoon was 130/67.  Just started checking her blood pressures on Thursday because her ears were ringing.  Her heart rates have been 84, 78, 67.  Patient states she has noticed at times that she feels "jittery."  States "I'm usually calm, but sometimes I guess I do feel anxious, nerves or something, and I don't know why because I don't have any reason, other than maybe I'm concerned about my blood pressure."  Neurology follow up: Family member states "we did not go back to Dr. Jaynee Eagles."  Notes that Dr. Tamala Julian was able to refer them to Dr. Carles Collet.  Patient is now on four weeks of her Parkinson's medication, one pill three times daily.  Notes "definitely keeping it consistent every five hours."  Says "the first thing all of [place] can see in taking the meds is a huge improvement in her movement, speech, reaction time."  Says "she says she can't see anything, but she also couldn't see how bad she was getting."  Family "definitely sees the improvement."  Notes her sleeping has become much much better.  The first four weeks, every week, family could see that "each week was better than the last week."  It has been a week of "one full pill every five hours."   Depression screen [facility] 2/9 03/12/2018 01/24/2018 08/22/2017 06/27/2017  Decreased Interest 0 0 0 0  Down,  Depressed, Hopeless 0 0 0 0  PHQ - 2 Score 0 0 0 0  Altered sleeping 0 0 0 -  Tired, decreased energy 0 0 1 -  Change in appetite 0 0 0 -  Feeling bad or failure about yourself  0 0 0 -  Trouble concentrating 0 0 0 -  Moving slowly or fidgety/restless 0 0 0 -  Suicidal thoughts 0 0 0 -  PHQ-9 Score 0 0 1 -  Difficult doing work/chores Not difficult at all Not difficult at all Not difficult at all -    No flowsheet data found.   Impression and Recommendations:    1. Elevated blood pressure reading   2. White coat syndrome  without diagnosis of hypertension   3. Prediabetes   4. Anxiousness   5. Other fatigue   6. Mixed hyperlipidemia   7. Parkinsonism, unspecified Parkinsonism type ([place])   8. History of vitamin D deficiency     Essential Hypertension - Elevated Blood Pressure - Extensively discussed goal blood pressure in older patients.  - Explained that with low BP, older patient's may feel dizzy and lightheaded, however, discussed that BP should not be in the 150's, 160's, 170's on a regular basis.  Reviewed 130's/80's, up to 140/90 as goal blood pressure.  - Discussed physiological effects of anxiety on BP.  - Recommended beginning a blood pressure medication in near future. - Reviewed beta-blockers with patient today.  Educated patient that if anxiousness or excess worry is playing a role, beta-blockers will help to blunt the anxiety response, including the patient's elevated heart rate, and also potentially assist with tremors.  - Discussed need for patient to be aware of orthostatic hypotensive symptoms.  To help avoid dizziness, advised patient to avoid getting up too quickly or otherwise changing position too quickly.  - Patient and family member desire to wait on prescribing BP med until next OV. - Discussed importance of appropriately checking BP at home. - Encouraged patient to check BP and heart rate a couple of times per day. - Advised patient to visit [email or web address].org for guidelines on managing blood pressure.  - Prudent health habits discussed today.  - Will continue to monitor closely and obtain further assessment PRN.  Parkinsonian-like Syndrome - Sees Dr. Carles Collet of Neuro - Discussed that starting new medication for Parkinson's could be causing additional S-E. - Advised patient to continue treatment plan as prescribed. - Patient knows to continue to follow-up with Dr. Carles Collet as established. - Advised patient to take any concerns about S-E to Dr. Carles Collet for review. - Will continue to monitor  closely.  Health Counseling & Preventative Health Maintenance - Advised patient to continue working toward exercising to improve overall mental, physical, and emotional health.    - Reviewed the "spokes of the wheel" of mood and health management.  Stressed the importance of ongoing prudent habits, including regular exercise, appropriate sleep hygiene, healthful dietary habits, and prayer/meditation to relax.  - Encouraged patient to engage in daily physical activity as tolerated, especially a formal exercise routine.  Recommended that the patient eventually strive for at least 150 minutes of moderate cardiovascular activity per week according to guidelines established by the [facility]   - Healthy dietary habits encouraged, including low-carb, and high amounts of lean protein in diet.   - Patient should also consume adequate amounts of water.  - Health counseling performed.  All questions answered.  Recommendations - Recommended returning for full blood work in near future. - Return in  future for OV as scheduled to address foot fungus concerns. - Discussed returning in near future for Medicare Wellness visit on video. - Otherwise, continue to obtain management from specialists as established. - Patient knows to let us know if anything happens with her BP in the meantime.   - As part of my medical decision making, I reviewed the following data within the [place] History obtained from pt /family, CMA notes reviewed and incorporated if applicable, Labs reviewed, Radiograph/ tests reviewed if applicable and OV notes from prior OV's with me, as well as other specialists she/he has seen since seeing me last, were all reviewed and used in my medical decision making process today.   - Additionally, discussion had with patient regarding txmnt plan, their biases about that plan etc were used in my medical decision making today.   - The patient agreed with the plan and demonstrated an  understanding of the instructions.   No barriers to understanding were identified.   - Red flag symptoms and signs discussed in detail.  Patient expressed understanding regarding what to do in case of emergency\ urgent symptoms.  The patient was advised to call back or seek an in-person evaluation if the symptoms worsen or if the condition fails to improve as anticipated.   Return for fasting blood work 3 days prior to chronic f/up visit 10/22-bring BP log;toenails.    Orders Placed This Encounter  Procedures  . Lipid panel  . T3  . T4, free  . TSH  . VITAMIN D 25 Hydroxy (Vit-D Deficiency, Fractures)  . Hemoglobin A1c  . Comprehensive metabolic panel  . CBC with Differential/Platelet    I provided 24 minutes of face-to-face time during this encounter.  Additional time was spent with charting and coordination of care after the actual visit commenced.   Note:  This note was prepared with assistance of Dragon voice recognition software. Occasional wrong-word or sound-a-like substitutions may have occurred due to the inherent limitations of voice recognition software  This document serves as a record of services personally performed by [name], DO. It was created on her behalf by Toni Amend, a trained medical scribe. The creation of this record is based on the scribe's personal observations and the provider's statements to them.   I have reviewed the above medical documentation for accuracy and completeness and I concur.  [name], DO 11/22/2018 6:42 PM     Patient Care Team    Relationship Specialty Notifications Start End  [name], DO PCP - General Family Medicine  06/27/17   [name], MD Consulting Physician Cardiology  06/27/17   Melvenia Beam, MD Consulting Physician Neurology  06/27/17   Sydnee Levans, MD Referring Physician Dermatology  06/27/17   Rehabilitation, [place]    06/27/17     -Vitals obtained; medications/ allergies  reconciled;  personal medical, social, Sx etc.histories were updated by CMA, reviewed by me and are reflected in chart  Patient Active Problem List   Diagnosis Date Noted  . Mixed hyperlipidemia 08/22/2017    Priority: High  . Prediabetes 08/22/2017    Priority: High  . White coat syndrome without diagnosis of hypertension 08/22/2017    Priority: High  . Anxiousness 06/27/2017    Priority: High  . Essential hypertension 08/14/2016    Priority: High  . Other fatigue 06/27/2017    Priority: Medium  . Parkinsonian-like syndrome [facility])- sees Neuro- Dr Tat now 03/13/2017    Priority: Medium  .  History of vitamin D deficiency 08/22/2017    Priority: Low  . Generalized OA-    especially knees-see sports med 07/29/2018  . Right knee pain 05/07/2018  . Degenerative joint disease of knee, right 03/19/2018  . Recurrent R knee pain- acute on chronic after twisting injury 03/02/18 03/12/2018  . Osteoarthritis of knee 09/18/2017  . Lumbar facet arthropathy 06/27/2017  . History of hysterectomy for benign disease 06/27/2017  . History of dizziness 06/27/2017  . Overweight (BMI 25.0-29.9) 06/27/2017  . Weakness of both hips 04/18/2017  . Abnormality of gait 04/18/2017  . Spinal stenosis of lumbar region 04/18/2017  . Pancolitis ([place]) 08/14/2016  . Leukocytes in urine 08/14/2016     Current Meds  Medication Sig  . aspirin 81 MG tablet Take 325 mg by mouth daily.   . Biotin 10000 MCG TABS Take by mouth. 1/2 tablet daily  . carbidopa-levodopa (SINEMET IR) 25-100 MG tablet Take 1 tablet 3 times a day at 6am, 11am, 4pm  . cholecalciferol (VITAMIN D) 25 MCG (1000 UT) tablet Take 1,000 Units by mouth daily.  . Coenzyme Q10 (COQ-10) 100 MG CAPS Take 1 tablet by mouth daily.  . Cyanocobalamin (VITAMIN B 12 PO) Take by mouth. 5, 000 mcg daily  . Glucosamine-Chondroit-Vit C-Mn (GLUCOSAMINE 1500 COMPLEX PO) Glucosamine  . Misc Natural Products (TART CHERRY ADVANCED) CAPS Take by mouth. 2500mg  daily   . Multiple Vitamins-Minerals (PRESERVISION AREDS 2 PO) Take by mouth daily.  . Omega-3 Fatty Acids (OMEGA-3 FISH OIL PO) Take 1 tablet by mouth daily. 500mg   . Polyethylene Glycol 3350 (MIRALAX PO) Take by mouth daily.  . Turmeric 500 MG CAPS Take by mouth daily.  Marland Kitchen UNABLE TO FIND Take 2 tablets by mouth daily. Med Name: Cognium     Allergies  Allergen Reactions  . Flagyl [Metronidazole] Rash    Possible cause of rash      ROS:  See above HPI for pertinent positives and negatives   Objective:   Height 5\' 6"  (1.676 m), weight 163 lb (73.9 kg).  (if some vitals are omitted, this means that patient was UNABLE to obtain them even though they were asked to get them prior to OV today.  They were asked to call us at their earliest convenience with these once obtained.)  General: A & O * 3; visually in no acute distress; in usual state of health.  Skin: Visible skin appears normal and pt's usual skin color HEENT:  EOMI, head is normocephalic and atraumatic.  Sclera are anicteric. Neck has a good range of motion.  Lips are noncyanotic Chest: normal chest excursion and movement Respiratory: speaking in full sentences, no conversational dyspnea; no use of accessory muscles Psych: insight good, mood- appears full

## 2018-11-24 NOTE — Progress Notes (Signed)
[name] [address], [place] [address] Phone: [phone] Subjective:   [name], am serving as a scribe for Dr. Hulan Saas.  I'm seeing this patient by the request  of:    CC: Right knee pain  [id number]   09/09/2018   Repeat injection given today.  Tolerated the procedure well.  Does not want to try any of the [place] supplementation.  Concerned with the abnormality of gait and the weakness.  Differential includes a potential movement disorder such as Parkinson's is him.  Patient is set up with neurology but would like to consider another neurologist.  We will put an order and discussed with patient's primary care physician.  In addition of this encouraged her to continue the same different medications.  Has had some difficulty with certain medicines and will like to avoid gabapentin at the moment.  Do think it could be beneficial we discussed x-rays of the back today to further evaluate the history of spinal stenosis seen on CT scan in 2017.  We will consider possibility of epidurals at that time if necessary as well.  Follow-up with me for the knee in 10 weeks.  Spent  25 minutes with patient face-to-face and had greater than 50% of counseling including as described above in assessment and plan.  Update 11/25/2018   SUNDAE BERTH is a 84 y.o. female coming in with complaint of right knee pain.  Having difficulty with gait and weakness.  Given an injection.  Did not want viscosupplementation.  Patient had signs and symptoms consistent with Parkinson's disease.  Being followed by neurology.  Patient states that she is doing better. Has been walking and working on her strength by doing sit to stand each each. She did go to see Dr. Carles Collet and is now on medication for Parkinson's. Patient does not feel a difference on the medication but her daughter does note that patient is doing much better with her movements.   Past Medical History:   Diagnosis Date  . Hypertension    only one time  . Right knee pain   . Spinal stenosis of lumbar region    Past Surgical History:  Procedure Laterality Date  . HYSTERECTOMY ABDOMINAL WITH SALPINGECTOMY    . TONSILLECTOMY     Social History   Socioeconomic History  . Marital status: Widowed    Spouse name: Not on file  . Number of children: 3  . Years of education: 63  . Highest education level: High school graduate  Occupational History  . Not on file  Social Needs  . Financial resource strain: Not on file  . Food insecurity    Worry: Not on file    Inability: Not on file  . Transportation needs    Medical: Not on file    Non-medical: Not on file  Tobacco Use  . Smoking status: Never Smoker  . Smokeless tobacco: Never Used  Substance and Sexual Activity  . Alcohol use: No    Alcohol/week: 0.0 standard drinks  . Drug use: No  . Sexual activity: Not Currently  Lifestyle  . Physical activity    Days per week: Not on file    Minutes per session: Not on file  . Stress: Not on file  Relationships  . Social Herbalist on phone: Not on file    Gets together: Not on file    Attends religious service: Not on file    Active  member of club or organization: Not on file    Attends meetings of clubs or organizations: Not on file    Relationship status: Not on file  Other Topics Concern  . Not on file  Social History Narrative   Pt lives alone in her 1 story home- she has 3 sons, and a high school graduate. She is right handed, she drinks coffee, and one soda a week mostly water   Allergies  Allergen Reactions  . Flagyl [Metronidazole] Rash    Possible cause of rash    Family History  Problem Relation Age of Onset  . Breast cancer Mother   . Melanoma Father   . Healthy Son        Current Outpatient Medications (Analgesics):  .  aspirin 81 MG tablet, Take 325 mg by mouth daily.   Current Outpatient Medications (Hematological):  Marland Kitchen  Cyanocobalamin  (VITAMIN B 12 PO), Take by mouth. 5, 000 mcg daily  Current Outpatient Medications (Other):  .  Biotin 10000 MCG TABS, Take by mouth. 1/2 tablet daily .  carbidopa-levodopa (SINEMET IR) 25-100 MG tablet, Take 1 tablet 3 times a day at 6am, 11am, 4pm .  cholecalciferol (VITAMIN D) 25 MCG (1000 UT) tablet, Take 1,000 Units by mouth daily. .  Coenzyme Q10 (COQ-10) 100 MG CAPS, Take 1 tablet by mouth daily. .  Glucosamine-Chondroit-Vit C-Mn (GLUCOSAMINE 1500 COMPLEX PO), Glucosamine .  Misc Natural Products (TART CHERRY ADVANCED) CAPS, Take by mouth. 2500mg  daily .  Multiple Vitamins-Minerals (PRESERVISION AREDS 2 PO), Take by mouth daily. .  Omega-3 Fatty Acids (OMEGA-3 FISH OIL PO), Take 1 tablet by mouth daily. 500mg  .  Polyethylene Glycol 3350 (MIRALAX PO), Take by mouth daily. .  Turmeric 500 MG CAPS, Take by mouth daily. Marland Kitchen  UNABLE TO FIND, Take 2 tablets by mouth daily. Med Name: Cognium    Past medical history, social, surgical and family history all reviewed in electronic medical record.  No pertanent information unless stated regarding to the chief complaint.   Review of Systems:  No headache, visual changes, nausea, vomiting, diarrhea, constipation, dizziness, abdominal pain, skin rash, fevers, chills, night sweats, weight loss, swollen lymph nodes, body aches, joint swelling, muscle aches, chest pain, shortness of breath, mood changes.   Objective  Blood pressure 132/88, pulse 81, height 5\' 6"  (1.676 m), weight 164 lb (74.4 kg), SpO2 98 %.    General: No apparent distress alert and oriented x3 mood and affect normal, dressed appropriately.  Masked faces noted HEENT: Pupils equal, extraocular movements intact  Respiratory: Patient's speak in full sentences and does not appear short of breath  Cardiovascular: No lower extremity edema, non tender, no erythema  Skin: Warm dry intact with no signs of infection or rash on extremities or on axial skeleton.  Abdomen: Soft nontender   Neuro: Cranial nerves II through XII are intact, neurovascularly intact in all extremities with 2+ DTRs and 2+ pulses.  Lymph: No lymphadenopathy of posterior or anterior cervical chain or axillae bilaterally.  Gait mild shuffling gait MSK:  tender with full range of motion and good stability and symmetric strength and tone of shoulders, elbows, wrist, hip, knee and ankles bilaterally.  Mild cogwheeling    Impression and Recommendations:     The above documentation has been reviewed and is accurate and complete Lyndal Pulley, DO       Note: This dictation was prepared with Dragon dictation along with smaller phrase technology. Any transcriptional errors that result from this process  are unintentional.

## 2018-11-25 ENCOUNTER — Ambulatory Visit (INDEPENDENT_AMBULATORY_CARE_PROVIDER_SITE_OTHER): Payer: Medicare Other | Admitting: Family Medicine

## 2018-11-25 ENCOUNTER — Encounter: Payer: Self-pay | Admitting: Family Medicine

## 2018-11-25 ENCOUNTER — Other Ambulatory Visit: Payer: Self-pay

## 2018-11-25 DIAGNOSIS — M1711 Unilateral primary osteoarthritis, right knee: Secondary | ICD-10-CM

## 2018-11-25 NOTE — Assessment & Plan Note (Signed)
Arthritic changes but patient did not want an injection.  Feels like she has made significant progress.  Very happy with the switch with her neurologist.  Patient will continue with conservative therapy, given trial of topical anti-inflammatories, follow-up again in  8 weeks

## 2018-11-25 NOTE — Patient Instructions (Signed)
Pennsaid 2x a day as needed, finger tip sized amount Keep doing exercises See me in 2-3 months

## 2018-12-09 ENCOUNTER — Other Ambulatory Visit: Payer: Self-pay

## 2018-12-09 ENCOUNTER — Other Ambulatory Visit: Payer: Medicare Other

## 2018-12-09 DIAGNOSIS — R030 Elevated blood-pressure reading, without diagnosis of hypertension: Secondary | ICD-10-CM

## 2018-12-09 DIAGNOSIS — Z8639 Personal history of other endocrine, nutritional and metabolic disease: Secondary | ICD-10-CM

## 2018-12-09 DIAGNOSIS — R7303 Prediabetes: Secondary | ICD-10-CM

## 2018-12-09 DIAGNOSIS — E782 Mixed hyperlipidemia: Secondary | ICD-10-CM

## 2018-12-09 DIAGNOSIS — R5383 Other fatigue: Secondary | ICD-10-CM

## 2018-12-09 DIAGNOSIS — F419 Anxiety disorder, unspecified: Secondary | ICD-10-CM

## 2018-12-10 LAB — COMPREHENSIVE METABOLIC PANEL
ALT: 8 IU/L (ref 0–32)
AST: 22 IU/L (ref 0–40)
Albumin/Globulin Ratio: 2 (ref 1.2–2.2)
Albumin: 4.7 g/dL — ABNORMAL HIGH (ref 3.6–4.6)
Alkaline Phosphatase: 104 IU/L (ref 39–117)
BUN/Creatinine Ratio: 15 (ref 12–28)
BUN: 12 mg/dL (ref 8–27)
Bilirubin Total: 0.4 mg/dL (ref 0.0–1.2)
CO2: 22 mmol/L (ref 20–29)
Calcium: 10.8 mg/dL — ABNORMAL HIGH (ref 8.7–10.3)
Chloride: 102 mmol/L (ref 96–106)
Creatinine, Ser: 0.8 mg/dL (ref 0.57–1.00)
GFR calc Af Amer: 79 mL/min/{1.73_m2} (ref >59)
GFR calc non Af Amer: 69 mL/min/{1.73_m2} (ref >59)
Globulin, Total: 2.4 g/dL (ref 1.5–4.5)
Glucose: 105 mg/dL — ABNORMAL HIGH (ref 65–99)
Potassium: 4.5 mmol/L (ref 3.5–5.2)
Sodium: 139 mmol/L (ref 134–144)
Total Protein: 7.1 g/dL (ref 6.0–8.5)

## 2018-12-10 LAB — CBC WITH DIFFERENTIAL/PLATELET
Basophils Absolute: 0.1 10*3/uL (ref 0.0–0.2)
Basos: 1 %
EOS (ABSOLUTE): 0.1 10*3/uL (ref 0.0–0.4)
Eos: 2 %
Hematocrit: 41 % (ref 34.0–46.6)
Hemoglobin: 13.9 g/dL (ref 11.1–15.9)
Immature Grans (Abs): 0 10*3/uL (ref 0.0–0.1)
Immature Granulocytes: 0 %
Lymphocytes Absolute: 2.2 10*3/uL (ref 0.7–3.1)
Lymphs: 37 %
MCH: 30.6 pg (ref 26.6–33.0)
MCHC: 33.9 g/dL (ref 31.5–35.7)
MCV: 90 fL (ref 79–97)
Monocytes Absolute: 0.5 10*3/uL (ref 0.1–0.9)
Monocytes: 9 %
Neutrophils Absolute: 3 10*3/uL (ref 1.4–7.0)
Neutrophils: 51 %
Platelets: 230 10*3/uL (ref 150–450)
RBC: 4.54 x10E6/uL (ref 3.77–5.28)
RDW: 12.6 % (ref 11.7–15.4)
WBC: 6 10*3/uL (ref 3.4–10.8)

## 2018-12-10 LAB — T4, FREE: Free T4: 1.28 ng/dL (ref 0.82–1.77)

## 2018-12-10 LAB — HEMOGLOBIN A1C
Est. average glucose Bld gHb Est-mCnc: 123 mg/dL
Hgb A1c MFr Bld: 5.9 % — ABNORMAL HIGH (ref 4.8–5.6)

## 2018-12-10 LAB — T3: T3, Total: 84 ng/dL (ref 71–180)

## 2018-12-10 LAB — VITAMIN D 25 HYDROXY (VIT D DEFICIENCY, FRACTURES): Vit D, 25-Hydroxy: 58 ng/mL (ref 30.0–100.0)

## 2018-12-10 LAB — LIPID PANEL
Chol/HDL Ratio: 3.3 ratio (ref 0.0–4.4)
Cholesterol, Total: 279 mg/dL — ABNORMAL HIGH (ref 100–199)
HDL: 84 mg/dL (ref >39)
LDL Chol Calc (NIH): 164 mg/dL — ABNORMAL HIGH (ref 0–99)
Triglycerides: 175 mg/dL — ABNORMAL HIGH (ref 0–149)
VLDL Cholesterol Cal: 31 mg/dL (ref 5–40)

## 2018-12-10 LAB — TSH: TSH: 3.76 u[IU]/mL (ref 0.450–4.500)

## 2018-12-12 ENCOUNTER — Encounter: Payer: Self-pay | Admitting: Family Medicine

## 2018-12-12 ENCOUNTER — Ambulatory Visit (INDEPENDENT_AMBULATORY_CARE_PROVIDER_SITE_OTHER): Payer: Medicare Other | Admitting: Family Medicine

## 2018-12-12 ENCOUNTER — Other Ambulatory Visit: Payer: Self-pay

## 2018-12-12 VITALS — BP 146/82 | HR 77 | Temp 98.3°F | Resp 18 | Wt 162.6 lb

## 2018-12-12 DIAGNOSIS — R7303 Prediabetes: Secondary | ICD-10-CM

## 2018-12-12 DIAGNOSIS — E782 Mixed hyperlipidemia: Secondary | ICD-10-CM

## 2018-12-12 DIAGNOSIS — R030 Elevated blood-pressure reading, without diagnosis of hypertension: Secondary | ICD-10-CM | POA: Diagnosis not present

## 2018-12-12 DIAGNOSIS — Z719 Counseling, unspecified: Secondary | ICD-10-CM

## 2018-12-12 DIAGNOSIS — R232 Flushing: Secondary | ICD-10-CM

## 2018-12-12 DIAGNOSIS — G20 Parkinson's disease: Secondary | ICD-10-CM

## 2018-12-12 DIAGNOSIS — G20C Parkinsonism, unspecified: Secondary | ICD-10-CM

## 2018-12-12 MED ORDER — CICLOPIROX OLAMINE 0.77 % EX CREA: TOPICAL_CREAM | CUTANEOUS | 0 refills | Status: DC

## 2018-12-12 MED ORDER — ROSUVASTATIN CALCIUM 5 MG PO TABS: ORAL_TABLET | ORAL | 0 refills | Status: DC

## 2018-12-12 NOTE — Progress Notes (Signed)
Assessment and plan:  1. White coat syndrome without diagnosis of hypertension   2. Prediabetes   3. Elevated blood pressure reading   4. Parkinsonism, unspecified Parkinsonism type ([place])   5. Mixed hyperlipidemia   6. Encounter for health education   7. Hot flash in middle of night with tinnitus * 35mo     Tinnitus - Extensively reviewed symptoms with patient and caregiver today. - Discussed that ringing in the ears could be due to blood pressure or blood sugar.  - Discussed referral to ENT for further evaluation. - Encouraged patient to call and ask neurology about possible S-E to medications as well. - Will continue to monitor.   Hot Flash in Middle of Night with Tinnitus - Discussed that at age 78, these sx are unlikely to be hormonally mediated. - Advised return to neurology for further assessment. - Discussed that this symptom may be a medication-related S-E, given the patient's treatment for Parkinson's.  - Will continue to monitor.   Insomnia w/ Parkinsonism - Per patient, has trouble sleeping through the night. - Encouraged patient caregiver to reach out to neurology and ask about changing medicine dosage intervals/times of day that the patient takes her medications.  - Advised asking neurologist about using trazodone as sleep aid concurrently with treatment plan.  - Patient and caregiver know to keep follow-up with neurology as scheduled, and ask questions PRN.  - Will continue to monitor.   Elevated Blood Pressure Reading, White Coat Syndrome - Stable at this time. - Discussed goal BP with patient today. - Reviewed we would rather see 140's/80's than too much lower at patient's age.  - No changes made to treatment plan today. - Ongoing ambulatory blood pressure monitoring encouraged.  - Will continue to monitor.  - Reviewed recent lab work (12/09/2018) in depth with patient today.  All lab  work within normal limits unless otherwise noted.  Extensive education provided and all questions answered.   Prediabetes - HbA1c stable at 5.9 last check, down from 6.0 prior. - Per patient, does not have a way to check blood sugar at home. - Prudent dietary habits discussed.  - Will continue to monitor.   Mixed Hyperlipidemia, Hypertriglyceridemia - LDL up to 164.  Was 160 in 2019, and 111 in 2018. - Triglycerides 175, up from 110 prior. - HDL at 84 last check, up from 81 prior.  - Cholesterol levels are not at goal. - Encouraged beginning very small dose of statin at this time. - Will begin low-dose Crestor nightly.  See med list.  - To help reduce S-E, encouraged drinking adequate amounts of water, and engaging in physical activity.  - Dietary changes such as low saturated & trans fat diets for hyperlipidemia and low carb diets for hypertriglyceridemia discussed with patient.    - Encouraged patient to follow AHA guidelines for regular exercise and also engage in weight loss if BMI above 25.   - Educational handouts provided at patient's desire and/ or told to look online at the [place] website for further information.  - We will continue to monitor.   Encounter for [place] patient to continue working toward exercising to help preserve memory, and improve overall mental, physical, and emotional health.    - Reviewed the "spokes of the wheel" of mood and health management.  Stressed the importance of ongoing prudent habits, including regular exercise, appropriate sleep hygiene, healthful dietary habits, and  prayer/meditation to relax.  - Encouraged patient to engage in daily physical activity as tolerated, especially a formal exercise routine.  Recommended that the patient eventually strive for at least 150 minutes of moderate cardiovascular activity per week according to guidelines established by the [facility].    - Healthy dietary habits encouraged, including low-carb, and high amounts of lean protein in diet.   - Patient should also consume adequate amounts of water.  - Health counseling performed.  All questions answered.   Recommendations - In two months, re-check liver enzymes and FLP   Education and routine counseling performed. Handouts provided.   Orders Placed This Encounter  Procedures  . Lipid panel  . ALT    Meds ordered this encounter  Medications  . ciclopirox (LOPROX) 0.77 % cream    Sig: Apply twice a day to rash on the skin of the feet    Dispense:  90 g    Refill:  0  . rosuvastatin (CRESTOR) 5 MG tablet    Sig: 0.5 tab po qhs every other night    Dispense:  45 tablet    Refill:  0    Return in 2 months (on 02/11/2019), or if symptoms worsen or fail to improve, for re-check liver enzymes and FLP- lab only; chronic f/up 46mo-BP, chol etc.   Anticipatory guidance and routine counseling done re: condition, txmnt options and need for follow up. All questions of patient's were answered.   Gross side effects, risk and benefits, and alternatives of medications discussed with patient.  Patient is aware that all medications have potential side effects and we are unable to predict every sideeffect or drug-drug interaction that may occur.  Expresses verbal understanding and consents to current therapy plan and treatment regiment.  Please see AVS handed out to patient at the end of our visit for additional patient instructions/ counseling done pertaining to today's office visit.  Note:  This document was prepared using Dragon voice recognition software and may include unintentional dictation errors.  This document serves as a record of services personally performed by [name], DO. It was created on her behalf by Toni Amend, a trained medical scribe. The creation of this record is based on the scribe's personal observations and the provider's statements to  them.   This case required medical decision making of at least moderate complexity. The above documentation has been reviewed to be accurate and was completed by Marjory Sneddon, D.O.    ----------------------------------------------------------------------------------------------------------------------  Subjective:   CC:   Jamie [name] is a 84 y.o. female who presents to [place] at [facility] today for review and discussion of recent bloodwork that was done in addition to f/up on chronic conditions we are managing for pt.  1. All recent blood work that we ordered was reviewed with patient today.  Patient was counseled on all abnormalities and we discussed dietary and lifestyle changes that could help those values (also medications when appropriate).  Extensive health counseling performed and all patient's concerns/ questions were addressed.  See labs below and also plan for more details of these abnormalities  Patient says she's doing fine but having hot flashes at night and ringing in her ears.  "Other than that, I get along pretty good."  Patient notes she does walk and engage in physical activity.  - Sleep Says she goes to bed and goes to sleep no problem, but wakes up at 1-2 in the morning and can't go back to sleep.  She gets up to go to the bathroom "and it's like my brain is wide awake; my brain wants to really work."  She gets up at 7 and takes her first pill, and then feels she can go back to sleep.  Notes "I've never been able to sleep late in my life."  Notes she takes her meds in the morning, noon, and night.  - Hot Flashes Caregiver says "[the hot flashes] are only at night and it wakes her up; she has to take the sheet off."    Per patient, hot flashes have been going on for months and months (about six months), and have not gotten any worse.  Patient thinks that the hot flashes have been "more" lately.  Caregiver confirms "more frequently lately."   "It's aggravating because it's hot."  Says it happens "just about every night."  - Ringing in the Ears Per caregiver, patient has ringing in her ears, which caused her to think she has high blood pressure.  Caregiver notes "using a good [blood pressure cuff] now."  Says she "forgets about the ringing after a while, it just goes away."  Says it's "not every day."  Patient repeats "I'm not worried about it."  She has not been seen by ENT for the ringing in the ears.  They weren't sure if the ringing in the ears or any other symptom was a reaction to the Parkinson's medicine.  They haven't asked the neurologist about this yet.      Wt Readings from Last 3 Encounters:  12/12/18 162 lb 9.6 oz (73.8 kg)  11/25/18 164 lb (74.4 kg)  11/22/18 163 lb (73.9 kg)   BP Readings from Last 3 Encounters:  12/12/18 (!) 146/82  11/25/18 132/88  10/15/18 (!) 160/82   Pulse Readings from Last 3 Encounters:  12/12/18 77  11/25/18 81  10/15/18 74   BMI Readings from Last 3 Encounters:  12/12/18 26.24 kg/m  11/25/18 26.47 kg/m  11/22/18 26.31 kg/m     Patient Care Team    Relationship Specialty Notifications Start End  [name], DO PCP - General Family Medicine  06/27/17   [name], MD Consulting Physician Cardiology  06/27/17   Sydnee Levans, MD Referring Physician Dermatology  06/27/17   Rehabilitation, [place]    06/27/17   Ludwig Clarks, [place] Physician Neurology  11/25/18    Comment: For Parkinson's    Full medical history updated and reviewed in the office today  Patient Active Problem List   Diagnosis Date Noted  . Mixed hyperlipidemia 08/22/2017    Priority: High  . Prediabetes 08/22/2017    Priority: High  . White coat syndrome without diagnosis of hypertension 08/22/2017    Priority: High  . Anxiousness 06/27/2017    Priority: High  . Essential hypertension 08/14/2016    Priority: High  . Other fatigue 06/27/2017    Priority: Medium   . Parkinsonian-like syndrome [facility])- sees Neuro- Dr Tat now 03/13/2017    Priority: Medium  . History of vitamin D deficiency 08/22/2017    Priority: Low  . Generalized OA-    especially knees-see sports med 07/29/2018  . Right knee pain 05/07/2018  . Degenerative joint disease of knee, right 03/19/2018  . Recurrent R knee pain- acute on chronic after twisting injury 03/02/18 03/12/2018  . Osteoarthritis of knee 09/18/2017  . Lumbar facet arthropathy 06/27/2017  . History of hysterectomy for benign disease 06/27/2017  . History of dizziness 06/27/2017  . Overweight (  BMI 25.0-29.9) 06/27/2017  . Weakness of both hips 04/18/2017  . Abnormality of gait 04/18/2017  . Spinal stenosis of lumbar region 04/18/2017  . Pancolitis ([place]) 08/14/2016  . Leukocytes in urine 08/14/2016    Past Medical History:  Diagnosis Date  . Hypertension    only one time  . Right knee pain   . Spinal stenosis of lumbar region     Past Surgical History:  Procedure Laterality Date  . HYSTERECTOMY ABDOMINAL WITH SALPINGECTOMY    . TONSILLECTOMY      Social History   Tobacco Use  . Smoking status: Never Smoker  . Smokeless tobacco: Never Used  Substance Use Topics  . Alcohol use: No    Alcohol/week: 0.0 standard drinks    Family Hx: Family History  Problem Relation Age of Onset  . Breast cancer Mother   . Melanoma Father   . Healthy Son      Medications: Current Outpatient Medications  Medication Sig Dispense Refill  . aspirin 81 MG tablet Take 325 mg by mouth daily.     . Biotin 10000 MCG TABS Take by mouth. 1/2 tablet daily    . carbidopa-levodopa (SINEMET IR) 25-100 MG tablet Take 1 tablet 3 times a day at 6am, 11am, 4pm 270 tablet 1  . cholecalciferol (VITAMIN D) 25 MCG (1000 UT) tablet Take 1,000 Units by mouth daily.    . Coenzyme Q10 (COQ-10) 100 MG CAPS Take 1 tablet by mouth daily.    . Cyanocobalamin (VITAMIN B 12 PO) Take by mouth. 5, 000 mcg daily    .  Glucosamine-Chondroit-Vit C-Mn (GLUCOSAMINE 1500 COMPLEX PO) Glucosamine    . Misc Natural Products (TART CHERRY ADVANCED) CAPS Take by mouth. 2500mg  daily    . Multiple Vitamins-Minerals (PRESERVISION AREDS 2 PO) Take by mouth daily.    . Omega-3 Fatty Acids (OMEGA-3 FISH OIL PO) Take 1 tablet by mouth daily. 500mg     . Polyethylene Glycol 3350 (MIRALAX PO) Take by mouth daily.    . Turmeric 500 MG CAPS Take by mouth daily.    Marland Kitchen UNABLE TO FIND Take 2 tablets by mouth daily. Med Name: Cognium    . ciclopirox (LOPROX) 0.77 % cream Apply twice a day to rash on the skin of the feet 90 g 0  . rosuvastatin (CRESTOR) 5 MG tablet 0.5 tab po qhs every other night 45 tablet 0   No current facility-administered medications for this visit.     Allergies:  Allergies  Allergen Reactions  . Flagyl [Metronidazole] Rash    Possible cause of rash      Review of Systems: General:   No F/C, wt loss Pulm:   No DIB, SOB, pleuritic chest pain Card:  No CP, palpitations Abd:  No n/v/d or pain Ext:  No inc edema from baseline  Objective:  Blood pressure (!) 146/82, pulse 77, temperature 98.3 F (36.8 C), resp. rate 18, weight 162 lb 9.6 oz (73.8 kg), SpO2 98 %. Body mass index is 26.24 kg/m. Gen:   Well NAD, A and O *3 HEENT:    [place]/AT, EOMI,  MMM Lungs:   Normal work of breathing. CTA B/L, no Wh, rhonchi Heart:   RRR, S1, S2 WNL's, no MRG Abd:   No gross distention Exts:    warm, pink,  Brisk capillary refill, warm and well perfused.  Psych:    No HI/SI, judgement and insight good, Euthymic mood. Full Affect.   Recent Results (from the past 2160 hour(s))  CBC  with Differential/Platelet     Status: None   Collection Time: 12/09/18  9:21 AM  Result Value Ref Range   WBC 6.0 3.4 - 10.8 x10E3/uL   RBC 4.54 3.77 - 5.28 x10E6/uL   Hemoglobin 13.9 11.1 - 15.9 g/dL   Hematocrit 41.0 34.0 - 46.6 %   MCV 90 79 - 97 fL   MCH 30.6 26.6 - 33.0 pg   MCHC 33.9 31.5 - 35.7 g/dL   RDW 12.6 11.7 - 15.4 %    Platelets 230 150 - 450 x10E3/uL   Neutrophils 51 Not Estab. %   Lymphs 37 Not Estab. %   Monocytes 9 Not Estab. %   Eos 2 Not Estab. %   Basos 1 Not Estab. %   Neutrophils Absolute 3.0 1.4 - 7.0 x10E3/uL   Lymphocytes Absolute 2.2 0.7 - 3.1 x10E3/uL   Monocytes Absolute 0.5 0.1 - 0.9 x10E3/uL   EOS (ABSOLUTE) 0.1 0.0 - 0.4 x10E3/uL   Basophils Absolute 0.1 0.0 - 0.2 x10E3/uL   Immature Granulocytes 0 Not Estab. %   Immature Grans (Abs) 0.0 0.0 - 0.1 x10E3/uL  Comprehensive metabolic panel     Status: Abnormal   Collection Time: 12/09/18  9:21 AM  Result Value Ref Range   Glucose 105 (H) 65 - 99 mg/dL   BUN 12 8 - 27 mg/dL   Creatinine, Ser 0.80 0.57 - 1.00 mg/dL   GFR calc non Af Amer 69 >59 mL/min/1.73   GFR calc Af Amer 79 >59 mL/min/1.73   BUN/Creatinine Ratio 15 12 - 28   Sodium 139 134 - 144 mmol/L   Potassium 4.5 3.5 - 5.2 mmol/L   Chloride 102 96 - 106 mmol/L   CO2 22 20 - 29 mmol/L   Calcium 10.8 (H) 8.7 - 10.3 mg/dL   Total Protein 7.1 6.0 - 8.5 g/dL   Albumin 4.7 (H) 3.6 - 4.6 g/dL   Globulin, Total 2.4 1.5 - 4.5 g/dL   Albumin/Globulin Ratio 2.0 1.2 - 2.2   Bilirubin Total 0.4 0.0 - 1.2 mg/dL   Alkaline Phosphatase 104 39 - 117 IU/L   AST 22 0 - 40 IU/L   ALT 8 0 - 32 IU/L  Hemoglobin A1c     Status: Abnormal   Collection Time: 12/09/18  9:21 AM  Result Value Ref Range   Hgb A1c MFr Bld 5.9 (H) 4.8 - 5.6 %    Comment:          Prediabetes: 5.7 - 6.4          Diabetes: >6.4          Glycemic control for adults with diabetes: <7.0    Est. average glucose Bld gHb Est-mCnc 123 mg/dL  VITAMIN D 25 Hydroxy (Vit-D Deficiency, Fractures)     Status: None   Collection Time: 12/09/18  9:21 AM  Result Value Ref Range   Vit D, 25-Hydroxy 58.0 30.0 - 100.0 ng/mL    Comment: Vitamin D deficiency has been defined by the Institute of Medicine and an Endocrine Society practice guideline as a level of serum 25-OH vitamin D less than 20 ng/mL (1,2). The Endocrine Society  went on to further define vitamin D insufficiency as a level between 21 and 29 ng/mL (2). 1. IOM (Institute of Medicine). 2010. Dietary reference    intakes for calcium and D. [place]: The    [employer]. 2. Holick MF, Binkley [place], Bischoff-Ferrari HA, et al.    Evaluation, treatment, and prevention  of vitamin D    deficiency: an Endocrine Society clinical practice    guideline. JCEM. 2011 Jul; 96(7):1911-30.   TSH     Status: None   Collection Time: 12/09/18  9:21 AM  Result Value Ref Range   TSH 3.760 0.450 - 4.500 uIU/mL  T4, free     Status: None   Collection Time: 12/09/18  9:21 AM  Result Value Ref Range   Free T4 1.28 0.82 - 1.77 ng/dL  T3     Status: None   Collection Time: 12/09/18  9:21 AM  Result Value Ref Range   T3, Total 84 71 - 180 ng/dL  Lipid panel     Status: Abnormal   Collection Time: 12/09/18  9:21 AM  Result Value Ref Range   Cholesterol, Total 279 (H) 100 - 199 mg/dL   Triglycerides 175 (H) 0 - 149 mg/dL   HDL 84 >39 mg/dL   VLDL Cholesterol Cal 31 5 - 40 mg/dL   LDL Chol Calc (NIH) 164 (H) 0 - 99 mg/dL   Chol/HDL Ratio 3.3 0.0 - 4.4 ratio    Comment:                                   T. Chol/HDL Ratio                                             Men  Women                               1/2 Avg.Risk  3.4    3.3                                   Avg.Risk  5.0    4.4                                2X Avg.Risk  9.6    7.1                                3X Avg.Risk 23.4   11.0

## 2019-01-28 ENCOUNTER — Other Ambulatory Visit: Payer: Medicare Other

## 2019-01-28 ENCOUNTER — Other Ambulatory Visit: Payer: Self-pay

## 2019-01-28 DIAGNOSIS — E782 Mixed hyperlipidemia: Secondary | ICD-10-CM

## 2019-01-29 ENCOUNTER — Other Ambulatory Visit: Payer: Self-pay | Admitting: Family Medicine

## 2019-01-29 LAB — ALT: ALT: 6 IU/L (ref 0–32)

## 2019-01-29 LAB — LIPID PANEL
Chol/HDL Ratio: 2.5 ratio (ref 0.0–4.4)
Cholesterol, Total: 215 mg/dL — ABNORMAL HIGH (ref 100–199)
HDL: 86 mg/dL (ref >39)
LDL Chol Calc (NIH): 108 mg/dL — ABNORMAL HIGH (ref 0–99)
Triglycerides: 123 mg/dL (ref 0–149)
VLDL Cholesterol Cal: 21 mg/dL (ref 5–40)

## 2019-01-29 MED ORDER — ROSUVASTATIN CALCIUM 5 MG PO TABS: ORAL_TABLET | ORAL | 1 refills | Status: DC

## 2019-01-30 ENCOUNTER — Telehealth: Payer: Self-pay | Admitting: Family Medicine

## 2019-01-30 DIAGNOSIS — E782 Mixed hyperlipidemia: Secondary | ICD-10-CM

## 2019-01-30 NOTE — Telephone Encounter (Signed)
Spoke with [name] who is on patient DPR and advised her of patient results and medication and when to follow up for labs. [name] verbalized understanding and call was transferred to front desk for scheduling. Future order placed for fasting labs as advised per Dr. Raliegh Scarlet. AS, CMA

## 2019-01-30 NOTE — Telephone Encounter (Signed)
-----   Message from [name], DO sent at 01/29/2019  7:50 PM EST ----- Chesley Noon,  Due to patient's age etc., please call her with my recommendations as per below.  -Please let her know that her cholesterol panel did improve but we still have room for improvement.  Her liver enzymes are perfect and show no irritation due to the medications.  I sent in a new prescription for 5 mg of Crestor 1 p.o. nightly.  Please have the patient take as prescribed and pick up this new prescription.  -These labs should be rechecked-these exact ones in another 3 months.  Please place future orders for ALT and FLP in 3 months.  I appreciate it.  Thank you.

## 2019-02-04 ENCOUNTER — Ambulatory Visit: Payer: Medicare Other | Admitting: Family Medicine

## 2019-02-24 DIAGNOSIS — H353131 Nonexudative age-related macular degeneration, bilateral, early dry stage: Secondary | ICD-10-CM | POA: Diagnosis not present

## 2019-02-24 DIAGNOSIS — H52203 Unspecified astigmatism, bilateral: Secondary | ICD-10-CM | POA: Diagnosis not present

## 2019-02-24 DIAGNOSIS — Z961 Presence of intraocular lens: Secondary | ICD-10-CM | POA: Diagnosis not present

## 2019-02-27 ENCOUNTER — Other Ambulatory Visit: Payer: Self-pay

## 2019-02-27 ENCOUNTER — Ambulatory Visit (INDEPENDENT_AMBULATORY_CARE_PROVIDER_SITE_OTHER): Payer: Medicare PPO | Admitting: Family Medicine

## 2019-02-27 ENCOUNTER — Encounter: Payer: Self-pay | Admitting: Family Medicine

## 2019-02-27 VITALS — BP 122/88 | HR 85 | Ht 66.0 in | Wt 168.0 lb

## 2019-02-27 DIAGNOSIS — G20 Parkinson's disease: Secondary | ICD-10-CM | POA: Diagnosis not present

## 2019-02-27 DIAGNOSIS — M1711 Unilateral primary osteoarthritis, right knee: Secondary | ICD-10-CM

## 2019-02-27 NOTE — Assessment & Plan Note (Signed)
Repeat injection given today.  Tolerated the procedure well.  Discussed icing regimen and home exercise, discussed which activities to potentially avoid.  Patient is to increase activity slowly over the course the next several weeks.  Patient does have Parkinson's disease and I am concerned that this is going to contribute to falls.  Patient is having some mild difficulty with balance and coordination.  Patient has declined formal physical therapy though today.  Patient will follow up with me again in 3 months otherwise.

## 2019-02-27 NOTE — Progress Notes (Signed)
[name] Phone: [phone] Subjective:   [name], am serving as a scribe for Dr. Hulan Saas. This visit occurred during the SARS-CoV-2 public health emergency.  Safety protocols were in place, including screening questions prior to the visit, additional usage of staff PPE, and extensive cleaning of exam room while observing appropriate contact time as indicated for disinfecting solutions.   I'm seeing this patient by the request  of:    CC: Right knee pain  [id number]   11/25/2018 Arthritic changes but patient did not want an injection.  Feels like she has made significant progress.  Very happy with the switch with her neurologist.  Patient will continue with conservative therapy, given trial of topical anti-inflammatories, follow-up again in  8 weeks  Update 02/27/2019 MICHALE [name] is a 85 y.o. female coming in with complaint of right knee pain. Patient states that she is having increasing pain in her knee. Would like to get an injection.  Patient feels the pain started come back again.  Only been for about 1 week.  Starting to have some swelling.  Is having difficulty getting up and down somewhat.  Has not been doing the exercises regularly.     Past Medical History:  Diagnosis Date  . Hypertension    only one time  . Right knee pain   . Spinal stenosis of lumbar region    Past Surgical History:  Procedure Laterality Date  . HYSTERECTOMY ABDOMINAL WITH SALPINGECTOMY    . TONSILLECTOMY     Social History   Socioeconomic History  . Marital status: Widowed    Spouse name: Not on file  . Number of children: 3  . Years of education: 78  . Highest education level: High school graduate  Occupational History  . Not on file  Tobacco Use  . Smoking status: Never Smoker  . Smokeless tobacco: Never Used  Substance and Sexual Activity  . Alcohol use: No    Alcohol/week: 0.0 standard drinks   . Drug use: No  . Sexual activity: Not Currently  Other Topics Concern  . Not on file  Social History Narrative   Pt lives alone in her 1 story home- she has 3 sons, and a high school graduate. She is right handed, she drinks coffee, and one soda a week mostly water   Social Determinants of Health   Financial Resource Strain:   . Difficulty of Paying Living Expenses: Not on file  Food Insecurity:   . Worried About Charity fundraiser in the Last Year: Not on file  . Ran Out of Food in the Last Year: Not on file  Transportation Needs:   . Lack of Transportation (Medical): Not on file  . Lack of Transportation (Non-Medical): Not on file  Physical Activity:   . Days of Exercise per Week: Not on file  . Minutes of Exercise per Session: Not on file  Stress:   . Feeling of Stress : Not on file  Social Connections:   . Frequency of Communication with Friends and Family: Not on file  . Frequency of Social Gatherings with Friends and Family: Not on file  . Attends Religious Services: Not on file  . Active Member of Clubs or Organizations: Not on file  . Attends Archivist Meetings: Not on file  . Marital Status: Not on file   Allergies  Allergen Reactions  . Flagyl [Metronidazole] Rash  Possible cause of rash    Family History  Problem Relation Age of Onset  . Breast cancer Mother   . Melanoma Father   . Healthy Son      Current Outpatient Medications (Cardiovascular):  .  rosuvastatin (CRESTOR) 5 MG tablet, 1 tab po qhs   Current Outpatient Medications (Analgesics):  .  aspirin 81 MG tablet, Take 325 mg by mouth daily.   Current Outpatient Medications (Hematological):  Marland Kitchen  Cyanocobalamin (VITAMIN B 12 PO), Take by mouth. 5, 000 mcg daily  Current Outpatient Medications (Other):  .  Biotin 10000 MCG TABS, Take by mouth. 1/2 tablet daily .  carbidopa-levodopa (SINEMET IR) 25-100 MG tablet, Take 1 tablet 3 times a day at 6am, 11am, 4pm .  cholecalciferol  (VITAMIN D) 25 MCG (1000 UT) tablet, Take 1,000 Units by mouth daily. .  ciclopirox (LOPROX) 0.77 % cream, Apply twice a day to rash on the skin of the feet .  Coenzyme Q10 (COQ-10) 100 MG CAPS, Take 1 tablet by mouth daily. .  Glucosamine-Chondroit-Vit C-Mn (GLUCOSAMINE 1500 COMPLEX PO), Glucosamine .  Misc Natural Products (TART CHERRY ADVANCED) CAPS, Take by mouth. 2500mg  daily .  Multiple Vitamins-Minerals (PRESERVISION AREDS 2 PO), Take by mouth daily. .  Omega-3 Fatty Acids (OMEGA-3 FISH OIL PO), Take 1 tablet by mouth daily. 500mg  .  Polyethylene Glycol 3350 (MIRALAX PO), Take by mouth daily. .  Turmeric 500 MG CAPS, Take by mouth daily. Marland Kitchen  UNABLE TO FIND, Take 2 tablets by mouth daily. Med Name: Cognium    Past medical history, social, surgical and family history all reviewed in electronic medical record.  No pertanent information unless stated regarding to the chief complaint.   Review of Systems:  No headache, visual changes, nausea, vomiting, diarrhea, constipation, dizziness, abdominal pain, skin rash, fevers, chills, night sweats, weight loss, swollen lymph nodes, body aches, joint swelling,  chest pain, shortness of breath, mood changes.  Positive muscle aches  Objective  Blood pressure 122/88, pulse 85, height 5\' 6"  (1.676 m), weight 168 lb (76.2 kg), SpO2 99 %.    General: apparent distress alert and oriented x3 mood and affect normal, dressed appropriately.  Masked facies noted HEENT: Pupils equal, extraocular movements intact  Respiratory: Patient's speak in full sentences and does not appear short of breath  Cardiovascular: Trace lower extremity edema, non tender, no erythema  Skin: Warm dry intact with no signs of infection or rash on extremities or on axial skeleton.  Abdomen: Soft nontender  Neuro: Cranial nerves II through XII are intact, neurovascularly intact in all extremities with 2+ DTRs and 2+ pulses.  Lymph: No lymphadenopathy of posterior or anterior  cervical chain or axillae bilaterally.  Gait shuffling gait MSK: Patient does have some mild cogwheeling of the upper extremities noted today.  Very mild resting tremor of the right upper extremity.  Knee: Right valgus deformity noted. Large thigh to calf ratio.  Tender to palpation over medial and PF joint line.  ROM full in flexion and extension and lower leg rotation. instability with valgus force.  painful patellar compression. Patellar glide with moderate crepitus. Patellar and quadriceps tendons unremarkable. Hamstring and quadriceps strength is normal. Contralateral knee shows mild arthritic changes as well  After informed written and verbal consent, patient was seated on exam table. Right knee was prepped with alcohol swab and utilizing anterolateral approach, patient's right knee space was injected with 4:1  marcaine 0.5%: Kenalog 40mg /dL. Patient tolerated the procedure well without immediate complications.  Impression and Recommendations:     This case required medical decision making of moderate complexity. The above documentation has been reviewed and is accurate and complete Lyndal Pulley, DO       Note: This dictation was prepared with Dragon dictation along with smaller phrase technology. Any transcriptional errors that result from this process are unintentional.

## 2019-02-27 NOTE — Patient Instructions (Signed)
Pennsaid 2x daily, fingertip sized amount See me again in 10-12 weeks

## 2019-03-19 ENCOUNTER — Encounter: Payer: Self-pay | Admitting: Neurology

## 2019-03-19 NOTE — Progress Notes (Signed)
Virtual Visit via Video Note The purpose of this virtual visit is to provide medical care while limiting exposure to the novel coronavirus.    Consent was obtained for video visit:  Yes.   Answered questions that patient had about telehealth interaction:  Yes.   I discussed the limitations, risks, security and privacy concerns of performing an evaluation and management service by telemedicine. I also discussed with the patient that there may be a patient responsible charge related to this service. The patient expressed understanding and agreed to proceed.  Pt location: Home Physician Location: home Name of referring provider:  [name], DO I connected with Jamie [name] at patients initiation/request on 03/20/2019 at  3:00 PM EST by video enabled telemedicine application and verified that I am speaking with the correct person using two identifiers. Pt MRN:  [id number] Pt DOB:  10-Mar-1934 Video Participants:  Jamie [name];  Daughter Jamie [name]   History of Present Illness:  Patient seen today for follow-up of new diagnosis of Parkinson's disease/SWEDDs.  She started on levodopa last visit.  Reports that "I think that I am a little better."  Daughter, Jamie [name], states that once she got up to tid there was initially a big improvement.   In fact, she states that when she went to see Dr. Tamala Julian, he also noticed a large improvement.  Recently, however, she seemed to have declined some.  Pt denies falls.  Pt denies lightheadedness, near syncope.  No hallucinations.  Mood has been good.  My previous records as well as any outside records made available were reviewed prior to todays visit. Has been seeing Dr. Tamala Julian for knee pain.  Had injection recently.  Declined PT  Current movement d/o meds:  Carbidopa/levodopa 25/100, 1 tablet 3 times per day (started last visit)   Current Outpatient Medications on File Prior to Visit  Medication Sig Dispense Refill  . aspirin 81 MG tablet Take 325 mg by  mouth daily.     . Biotin 10000 MCG TABS Take by mouth. 1/2 tablet daily    . carbidopa-levodopa (SINEMET IR) 25-100 MG tablet Take 1 tablet 3 times a day at 6am, 11am, 4pm 270 tablet 1  . cholecalciferol (VITAMIN D) 25 MCG (1000 UT) tablet Take 1,000 Units by mouth daily.    . ciclopirox (LOPROX) 0.77 % cream Apply twice a day to rash on the skin of the feet 90 g 0  . Coenzyme Q10 (COQ-10) 100 MG CAPS Take 1 tablet by mouth daily.    . Cyanocobalamin (VITAMIN B 12 PO) Take by mouth. 5, 000 mcg daily    . Glucosamine-Chondroit-Vit C-Mn (GLUCOSAMINE 1500 COMPLEX PO) Glucosamine    . Misc Natural Products (TART CHERRY ADVANCED) CAPS Take by mouth. 2500mg  daily    . Multiple Vitamins-Minerals (PRESERVISION AREDS 2 PO) Take by mouth daily.    . Omega-3 Fatty Acids (OMEGA-3 FISH OIL PO) Take 1 tablet by mouth daily. 500mg     . Polyethylene Glycol 3350 (MIRALAX PO) Take by mouth daily.    . rosuvastatin (CRESTOR) 5 MG tablet 1 tab po qhs 90 tablet 1  . Turmeric 500 MG CAPS Take by mouth daily.    Marland Kitchen UNABLE TO FIND Take 2 tablets by mouth daily. Med Name: Cognium     No current facility-administered medications on file prior to visit.     Observations/Objective:   There were no vitals filed for this visit. GEN:  The patient appears stated age and is in NAD.  Neurological examination:  Orientation: The patient is alert and oriented x3. Cranial nerves: There is good facial symmetry. There is facial hypomimia.  The speech is fluent and clear. Soft palate rises symmetrically and there is no tongue deviation. Hearing is intact to conversational tone. Motor: Strength is at least antigravity x 4.   Shoulder shrug is equal and symmetric.  There is no pronator drift.  Movement examination: Tone: unable Abnormal movements: None Coordination:  There is  decremation with RAM's, with finger taps bilaterally Gait and Station: The patient pushes off of the chair to arise.  Stride length is good, but arm  swing is decreased bilaterally.      Assessment and Plan:   1.  Parkinsons Disease/SWEDD  -Discussed several options.  Ultimately decided to increase levodopa to 1.5 tablets 3 times per day.  Prescription sent to the pharmacy.  -Talked about the fact that I really would like her to do physical therapy.  Talked about the benefits of this.  Talked about the fact that we could even do it in the home.  She has declined home and/or out of the house physical therapy.  I asked her to just think about this. 2.  Insomnia  -Talked about the importance of a regular daily schedule.  -May add melatonin, 3 mg nightly. 3.  Tinnitus  -Likely due to high-frequency hearing loss.  -If bothersome at night, can use noise machine  Follow Up Instructions:  5 months   -I discussed the assessment and treatment plan with the patient. The patient was provided an opportunity to ask questions and all were answered. The patient agreed with the plan and demonstrated an understanding of the instructions.   The patient was advised to call back or seek an in-person evaluation if the symptoms worsen or if the condition fails to improve as anticipated.    Total time spent on today's visit was 35 minutes, including both face-to-face time and nonface-to-face time.  Time included that spent on review of records (prior notes available to me/labs/imaging if pertinent), discussing treatment and goals, answering patient's questions and coordinating care.   Alonza Bogus, DO

## 2019-03-20 ENCOUNTER — Telehealth (INDEPENDENT_AMBULATORY_CARE_PROVIDER_SITE_OTHER): Payer: Medicare PPO | Admitting: Neurology

## 2019-03-20 ENCOUNTER — Other Ambulatory Visit: Payer: Self-pay

## 2019-03-20 VITALS — Ht 67.0 in | Wt 160.0 lb

## 2019-03-20 DIAGNOSIS — H9313 Tinnitus, bilateral: Secondary | ICD-10-CM

## 2019-03-20 DIAGNOSIS — G4700 Insomnia, unspecified: Secondary | ICD-10-CM | POA: Diagnosis not present

## 2019-03-20 DIAGNOSIS — G20 Parkinson's disease: Secondary | ICD-10-CM

## 2019-03-20 MED ORDER — CARBIDOPA-LEVODOPA 25-100 MG PO TABS: 1.5000 | ORAL_TABLET | Freq: Three times a day (TID) | ORAL | 1 refills | Status: DC

## 2019-04-15 ENCOUNTER — Ambulatory Visit: Payer: Medicare Other | Admitting: Family Medicine

## 2019-05-07 ENCOUNTER — Other Ambulatory Visit: Payer: Self-pay

## 2019-05-07 ENCOUNTER — Other Ambulatory Visit: Payer: Medicare PPO

## 2019-05-07 DIAGNOSIS — E782 Mixed hyperlipidemia: Secondary | ICD-10-CM | POA: Diagnosis not present

## 2019-05-08 LAB — LIPID PANEL
Chol/HDL Ratio: 2.4 ratio (ref 0.0–4.4)
Cholesterol, Total: 182 mg/dL (ref 100–199)
HDL: 77 mg/dL (ref >39)
LDL Chol Calc (NIH): 85 mg/dL (ref 0–99)
Triglycerides: 112 mg/dL (ref 0–149)
VLDL Cholesterol Cal: 20 mg/dL (ref 5–40)

## 2019-05-08 LAB — ALT: ALT: 5 IU/L (ref 0–32)

## 2019-05-13 ENCOUNTER — Other Ambulatory Visit: Payer: Self-pay

## 2019-05-13 ENCOUNTER — Encounter: Payer: Self-pay | Admitting: Family Medicine

## 2019-05-13 ENCOUNTER — Ambulatory Visit (INDEPENDENT_AMBULATORY_CARE_PROVIDER_SITE_OTHER): Payer: Medicare PPO | Admitting: Family Medicine

## 2019-05-13 VITALS — BP 148/84 | HR 69 | Temp 97.5°F | Resp 10 | Ht 66.0 in | Wt 174.8 lb

## 2019-05-13 DIAGNOSIS — R030 Elevated blood-pressure reading, without diagnosis of hypertension: Secondary | ICD-10-CM | POA: Diagnosis not present

## 2019-05-13 DIAGNOSIS — G20 Parkinson's disease: Secondary | ICD-10-CM

## 2019-05-13 DIAGNOSIS — E782 Mixed hyperlipidemia: Secondary | ICD-10-CM

## 2019-05-13 NOTE — Patient Instructions (Signed)
Health Maintenance After Age 83 After age 70, you are at a higher risk for certain long-term diseases and infections as well as injuries from falls. Falls are a major cause of broken bones and head injuries in people who are older than age 59. Getting regular preventive care can help to keep you healthy and well. Preventive care includes getting regular testing and making lifestyle changes as recommended by your health care provider. Talk with your health care provider about:  Which screenings and tests you should have. A screening is a test that checks for a disease when you have no symptoms.  A diet and exercise plan that is right for you. What should I know about screenings and tests to prevent falls? Screening and testing are the best ways to find a health problem early. Early diagnosis and treatment give you the best chance of managing medical conditions that are common after age 59. Certain conditions and lifestyle choices may make you more likely to have a fall. Your health care provider may recommend:  Regular vision checks. Poor vision and conditions such as cataracts can make you more likely to have a fall. If you wear glasses, make sure to get your prescription updated if your vision changes.  Medicine review. Work with your health care provider to regularly review all of the medicines you are taking, including over-the-counter medicines. Ask your health care provider about any side effects that may make you more likely to have a fall. Tell your health care provider if any medicines that you take make you feel dizzy or sleepy.  Osteoporosis screening. Osteoporosis is a condition that causes the bones to get weaker. This can make the bones weak and cause them to break more easily.  Blood pressure screening. Blood pressure changes and medicines to control blood pressure can make you feel dizzy.  Strength and balance checks. Your health care provider may recommend certain tests to  check your strength and balance while standing, walking, or changing positions.  Foot health exam. Foot pain and numbness, as well as not wearing proper footwear, can make you more likely to have a fall.  Depression screening. You may be more likely to have a fall if you have a fear of falling, feel emotionally low, or feel unable to do activities that you used to do.  Alcohol use screening. Using too much alcohol can affect your balance and may make you more likely to have a fall. What actions can I take to lower my risk of falls? General instructions  Talk with your health care provider about your risks for falling. Tell your health care provider if: ? You fall. Be sure to tell your health care provider about all falls, even ones that seem minor. ? You feel dizzy, sleepy, or off-balance.  Take over-the-counter and prescription medicines only as told by your health care provider. These include any supplements.  Eat a healthy diet and maintain a healthy weight. A healthy diet includes low-fat dairy products, low-fat (lean) meats, and fiber from whole grains, beans, and lots of fruits and vegetables. Home safety  Remove any tripping hazards, such as rugs, cords, and clutter.  Install safety equipment such as grab bars in bathrooms and safety rails on stairs.  Keep rooms and walkways well-lit. Activity   Follow a regular exercise program to stay fit. This will help you maintain your balance. Ask your health care provider what types of exercise are appropriate for you.  If you need a  cane or walker, use it as recommended by your health care provider.  Wear supportive shoes that have nonskid soles. Lifestyle  Do not drink alcohol if your health care provider tells you not to drink.  If you drink alcohol, limit how much you have: ? 0-1 drink a day for women. ? 0-2 drinks a day for men.  Be aware of how much alcohol is in your drink. In the [place] one drink equals one typical bottle  of beer (12 oz), one-half glass of wine (5 oz), or one shot of hard liquor (1 oz).  Do not use any products that contain nicotine or tobacco, such as cigarettes and e-cigarettes. If you need help quitting, ask your health care provider. Summary  Having a healthy lifestyle and getting preventive care can help to protect your health and wellness after age 80.  Screening and testing are the best way to find a health problem early and help you avoid having a fall. Early diagnosis and treatment give you the best chance for managing medical conditions that are more common for people who are older than age 51.  Falls are a major cause of broken bones and head injuries in people who are older than age 31. Take precautions to prevent a fall at home.  Work with your health care provider to learn what changes you can make to improve your health and wellness and to prevent falls. This information is not intended to replace advice given to you by your health care provider. Make sure you discuss any questions you have with your health care provider. Document Revised: 05/30/2018 Document Reviewed: 12/20/2016 Elsevier Patient Education  2020 [place] for a Low Cholesterol, Low Saturated Fat Diet   Fats - Limit total intake of fats and oils. - Avoid butter, stick margarine, shortening, lard, palm and coconut oils. - Limit mayonnaise, salad dressings, gravies and sauces, unless they are homemade with low-fat ingredients. - Limit chocolate. - Choose low-fat and nonfat products, such as low-fat mayonnaise, low-fat or non-hydrogenated peanut butter, low-fat or fat-free salad dressings and nonfat gravy. - Use vegetable oil, such as canola or olive oil. - Look for margarine that does not contain trans fatty acids. - Use nuts in moderate amounts. - Read ingredient labels carefully to determine both amount and type of fat present in foods. Limit saturated and trans fats! - Avoid  high-fat processed and convenience foods.  Meats and Meat Alternatives - Choose fish, chicken, [place] and lean meats. - Use dried beans, peas, lentils and tofu. - Limit egg yolks to three to four per week. - If you eat red meat, limit to no more than three servings per week and choose loin or round cuts. - Avoid fatty meats, such as bacon, sausage, franks, luncheon meats and ribs. - Avoid all organ meats, including liver.  Dairy - Choose nonfat or low-fat milk, yogurt and cottage cheese. - Most cheeses are high in fat. Choose cheeses made from non-fat milk, such as mozzarella and ricotta cheese. - Choose light or fat-free cream cheese and sour cream. - Avoid cream and sauces made with cream.  Fruits and Vegetables - Eat a wide variety of fruits and vegetables. - Use lemon juice, vinegar or "mist" olive oil on vegetables. - Avoid adding sauces, fat or oil to vegetables.  Breads, Cereals and Grains - Choose whole-grain breads, cereals, pastas and rice. - Avoid high-fat snack foods, such as granola, cookies, pies, pastries, doughnuts and  croissants.  Cooking Tips - Avoid deep fried foods. - Trim visible fat off meats and remove skin from poultry before cooking. - Bake, broil, boil, poach or roast poultry, fish and lean meats. - Drain and discard fat that drains out of meat as you cook it. - Add little or no fat to foods. - Use vegetable oil sprays to grease pans for cooking or baking. - Steam vegetables. - Use herbs or no-oil marinades to flavor foods.

## 2019-05-13 NOTE — Progress Notes (Signed)
Impression and Recommendations:    1. Mixed hyperlipidemia   2. White coat syndrome without diagnosis of hypertension   3. Parkinsonism, unspecified Parkinsonism type ([place])       Spells of Feeling Badly - Extensively discussed patient's concerns during appointment today.  - Encouraged patient to keep a journal of patient's spells of feeling badly. - Keep note prior to symptoms of whether she was nervous, alone, anticipating an event, etc. - Patient knows to watch for triggers, keep a log of her symptoms, and present this next appointment.  - Will clear any future choices for treatment plan with Dr. Carles Collet of neurology.  - Will continue to monitor.   Parkinsonism, unspecified Parkinsonism - Reviewed concerns of memory loss extensively with patient today. - Discussed signs of natural memory loss due to aging vs pathological memory loss.  - Stable at this time.  - To help prevent memory loss / maintain memory, encouraged patient to engage in increased physical activity, and hobbies and activities that stimulate her brain, such as reading, solving puzzles, learning new recipes, etc.  - Patient knows to bring all of her memory loss concerns to Dr. Carles Collet of Neurology as well.  - Will continue to monitor alongside Dr. Carles Collet of neurology.   White Coat Syndrome - Blood pressure elevated on intake today but known White Coat Syn  - Reviewed goal blood pressure with patient during appointment. - Advised patient to resume checking her blood pressure at home.  - Ambulatory blood pressure monitoring encouraged at least once per week.  Keep log and bring in every office visit.  Reminded patient that if they ever feel poorly in any way, to check their blood pressure and pulse.  - Encouraged patient to monitor for signs of symptomatic hypotension. - Advised patient to change positions slowly, such as from sitting to standing.  - Counseled patient on pathophysiology of disease and  discussed various treatment options, which always includes dietary and lifestyle modification as first line.   - Lifestyle changes such as dash and heart healthy diets and engaging in a regular exercise program discussed extensively with patient.   - We will continue to monitor.   Mixed Hyperlipidemia - Cholesterol levels at goal, stable last check.  Triglycerides = 112, down from 123 three months prior, and 175 give months ago. HDL = 77, down from 86 three months prior, and 84 five months ago. LDL = 85, down from 108 three months prior, and 164 five months ago.  - Pt will continue current treatment regimen.  See med list. - Patient tolerating medication well without change in liver enzymes.  - To help reduce incidence of S-E on medication, encouraged patient to continue to hydrate adequately and engage in regular physical activity.  - Prudent dietary changes such as low saturated & trans fat diets for hyperlipidemia and low carb diets for hypertriglyceridemia discussed with patient.    - Encouraged patient to follow AHA guidelines for regular exercise and also engage in weight loss if BMI above 25.   - We will continue to monitor and re-check as discussed.   Health Counseling & Preventative Maintenance - Advised patient to continue working toward exercising to improve overall mental, physical, and emotional health.    - Reviewed the "spokes of the wheel" of wellbeing.  Stressed the importance of ongoing prudent habits, including regular exercise, appropriate sleep hygiene, healthful dietary habits, and prayer/meditation to relax.  - Encouraged patient to engage in daily physical activity  as tolerated, especially a formal exercise routine.  Recommended that the patient eventually strive for at least 150 minutes of moderate cardiovascular activity per week according to guidelines established by the [facility].   - Health counseling performed.  All questions answered.   Recommendations -  Return for follow up in 4 months.    No orders of the defined types were placed in this encounter.   No orders of the defined types were placed in this encounter.   There are no discontinued medications.    Please see AVS handed out to patient at the end of our visit for further patient instructions/ counseling done pertaining to today's office visit.   Return for f/up 4 months for chronic condition management.     Note:  This note was prepared with assistance of Dragon voice recognition software. Occasional wrong-word or sound-a-like substitutions may have occurred due to the inherent limitations of voice recognition software.   The [place] was signed into law in 2016 which includes the topic of electronic health records.  This provides immediate access to information in MyChart.  This includes consultation notes, operative notes, office notes, lab results and pathology reports.  If you have any questions about what you read please let us know at your next visit or call us at the office.  We are right here with you.    This case required medical decision making of at least moderate complexity.  This document serves as a record of services personally performed by [name], DO. It was created on her behalf by Toni Amend, a trained medical scribe. The creation of this record is based on the scribe's personal observations and the provider's statements to them.    The above documentation from Toni Amend, medical scribe, has been reviewed by Marjory Sneddon, D.O.      --------------------------------------------------------------------------------------------------------------------------------------------------------------------------------------------------------------------------------------------    Subjective:     Phillips Odor, am serving as scribe for Dr.Lynne Righi.   HPI: Jamie [name] is a 85 y.o. female who presents  to [facility] today for issues as discussed below.  The patient has received her COVID-19 vaccination.  - Memory Per patient and family member present today, they think that the patient's memory is "about the same" right now.  The patient follows up with Dr. Carles Collet of Neurology.  Patient notes "whenever you can't think of something, you think, oh no, [memory loss] is coming."  Says sometimes she can't think of names, and sometimes someone will say something and she can't think of what she wants to say.  Confirms that Dr. Carles Collet did not feel concerned about the patient's memory loss.  Family member notes "we had a phone call that was face to face, and we did do an increase of her medication (sinemet)."  The patient takes her sinemet morning, lunch, and dinner, and may increase to four times per day in future.  - Spells of Feeling Badly Says she has spells that are hard to explain, where she "just feels real bad," and it lasts for a pretty long while.  Notes "I get more tense."  Her family member says they think that the patient's symptoms might be due to stress or panic attacks.  Denies sweating, racing heart.  Denies SOB or feeling scared.  Says she doesn't know how to explain it, and isn't sure if she's anxious or just has "nerves."  Usually she can start reading or do something else and her  concerns subside.  Says "If somebody's around, it goes away completely."  - Vitamin D Patient continues Vitamin D.  HPI:  Hypertension:  Notes she has not checked her blood pressure lately.  - Patient reports good compliance with medication and/or lifestyle modification  - Her denies acute concerns or problems related to treatment plan  States she feels lightheaded sometimes after certain movements, such as "if I start to reach up real high."  - She denies new onset of: chest pain, exercise intolerance, shortness of breath, dizziness, visual changes, headache, lower  extremity swelling or claudication.   Last 3 blood pressure readings in our office are as follows: BP Readings from Last 3 Encounters:  05/13/19 (!) 148/84  02/27/19 122/88  12/12/18 (!) 146/82   Filed Weights   05/13/19 1514  Weight: 174 lb 12.8 oz (79.3 kg)   HPI:  Hyperlipidemia:  85 y.o. female here for cholesterol follow-up.   - Patient reports good compliance with treatment plan of:  medication and/ or lifestyle management.    - Patient denies any acute concerns or problems with management plan   - She denies new onset of: myalgias, arthralgias, increased fatigue more than normal, chest pains, exercise intolerance, shortness of breath, dizziness, visual changes, headache, lower extremity swelling or claudication.   Most recent cholesterol panel was:  Lab Results  Component Value Date   CHOL 182 05/07/2019   HDL 77 05/07/2019   LDLCALC 85 05/07/2019   TRIG 112 05/07/2019   CHOLHDL 2.4 05/07/2019   Hepatic Function Latest Ref Rng & Units 05/07/2019 01/28/2019 12/09/2018  Total Protein 6.0 - 8.5 g/dL - - 7.1  Albumin 3.6 - 4.6 g/dL - - 4.7(H)  AST 0 - 40 IU/L - - 22  ALT 0 - 32 IU/L '5 6 8  '$ Alk Phosphatase 39 - 117 IU/L - - 104  Total Bilirubin 0.0 - 1.2 mg/dL - - 0.4        Wt Readings from Last 3 Encounters:  05/13/19 174 lb 12.8 oz (79.3 kg)  03/19/19 160 lb (72.6 kg)  02/27/19 168 lb (76.2 kg)   BP Readings from Last 3 Encounters:  05/13/19 (!) 148/84  02/27/19 122/88  12/12/18 (!) 146/82   Pulse Readings from Last 3 Encounters:  05/13/19 69  02/27/19 85  12/12/18 77   BMI Readings from Last 3 Encounters:  05/13/19 28.21 kg/m  03/19/19 25.06 kg/m  02/27/19 27.12 kg/m     Patient Care Team    Relationship Specialty Notifications Start End  [name], DO PCP - General Family Medicine  06/27/17   [name], MD Consulting Physician Cardiology  06/27/17   Sydnee Levans, MD Referring Physician Dermatology  06/27/17     Rehabilitation, [place]    06/27/17   Ludwig Clarks, DO Consulting Physician Neurology  11/25/18    Comment: For Parkinson's     Patient Active Problem List   Diagnosis Date Noted  . Mixed hyperlipidemia 08/22/2017  . Prediabetes 08/22/2017  . White coat syndrome without diagnosis of hypertension 08/22/2017  . Anxiousness 06/27/2017  . Essential hypertension 08/14/2016  . Other fatigue 06/27/2017  . Parkinsonian-like syndrome [facility])- sees Neuro- Dr Tat now 03/13/2017  . History of vitamin D deficiency 08/22/2017  . Generalized OA-    especially knees-see sports med 07/29/2018  . Right knee pain 05/07/2018  . Degenerative joint disease of knee, right 03/19/2018  . Recurrent R knee pain- acute on chronic after twisting injury 03/02/18 03/12/2018  .  Osteoarthritis of knee 09/18/2017  . Lumbar facet arthropathy 06/27/2017  . History of hysterectomy for benign disease 06/27/2017  . History of dizziness 06/27/2017  . Overweight (BMI 25.0-29.9) 06/27/2017  . Weakness of both hips 04/18/2017  . Abnormality of gait 04/18/2017  . Spinal stenosis of lumbar region 04/18/2017  . Pancolitis ([place]) 08/14/2016  . Leukocytes in urine 08/14/2016    Past Medical history, Surgical history, Family history, Social history, Allergies and Medications have been entered into the medical record, reviewed and changed as needed.    Current Meds  Medication Sig  . aspirin 81 MG tablet Take 325 mg by mouth daily.   . Biotin 10000 MCG TABS Take by mouth. 1/2 tablet daily  . carbidopa-levodopa (SINEMET IR) 25-100 MG tablet Take 1.5 tablets by mouth 3 (three) times daily.  . cholecalciferol (VITAMIN D) 25 MCG (1000 UT) tablet Take 1,000 Units by mouth daily.  . ciclopirox (LOPROX) 0.77 % cream Apply twice a day to rash on the skin of the feet  . Coenzyme Q10 (COQ-10) 100 MG CAPS Take 1 tablet by mouth daily.  . Cyanocobalamin (VITAMIN B 12 PO) Take by mouth. 5, 000 mcg daily  . ergocalciferol (VITAMIN D2)  1.25 MG (50000 UT) capsule ergocalciferol (vitamin D2) 1,250 mcg (50,000 unit) capsule  . Glucosamine-Chondroit-Vit C-Mn (GLUCOSAMINE 1500 COMPLEX PO) Glucosamine  . Misc Natural Products (TART CHERRY ADVANCED) CAPS Take by mouth. '2500mg'$  daily  . Multiple Vitamins-Minerals (PRESERVISION AREDS 2 PO) Take by mouth daily.  . Omega-3 Fatty Acids (OMEGA-3 FISH OIL PO) Take 1 tablet by mouth daily. '500mg'$   . Polyethylene Glycol 3350 (MIRALAX PO) Take by mouth daily.  . rosuvastatin (CRESTOR) 5 MG tablet 1 tab po qhs  . Turmeric 500 MG CAPS Take by mouth daily.  Marland Kitchen UNABLE TO FIND Take 2 tablets by mouth daily. Med Name: Cognium    Allergies:  Allergies  Allergen Reactions  . Flagyl [Metronidazole] Rash    Possible cause of rash      Review of Systems:  A fourteen system review of systems was performed and found to be positive as per HPI.   Objective:   Blood pressure (!) 148/84, pulse 69, temperature (!) 97.5 F (36.4 C), temperature source Oral, resp. rate 10, height '5\' 6"'$  (1.676 m), weight 174 lb 12.8 oz (79.3 kg), SpO2 99 %. Body mass index is 28.21 kg/m. General:  Well Developed, well nourished, appropriate for stated age.  Neuro:  Alert and oriented,  extra-ocular muscles intact  HEENT:  Normocephalic, atraumatic, neck supple, no carotid bruits appreciated  Skin:  no gross rash, warm, pink. Cardiac:  RRR, S1 S2 Respiratory:  ECTA B/L and A/P, Not using accessory muscles, speaking in full sentences- unlabored. Vascular:  Ext warm, no cyanosis apprec.; cap RF less 2 sec. Psych:  No HI/SI, judgement and insight good, Euthymic mood. Full Affect.

## 2019-05-28 NOTE — Progress Notes (Signed)
[place] Phone: [phone] Subjective:   Jamie [name], am serving as a scribe for Dr. Hulan Saas. This visit occurred during the SARS-CoV-2 public health emergency.  Safety protocols were in place, including screening questions prior to the visit, additional usage of staff PPE, and extensive cleaning of exam room while observing appropriate contact time as indicated for disinfecting solutions.   I'm seeing this patient by the request  of:  [name], DO  CC: knee pain follow up   [id number]   02/27/2019 Repeat injection given today.  Tolerated the procedure well.  Discussed icing regimen and home exercise, discussed which activities to potentially avoid.  Patient is to increase activity slowly over the course the next several weeks.  Patient does have Parkinson's disease and I am concerned that this is going to contribute to falls.  Patient is having some mild difficulty with balance and coordination.  Patient has declined formal physical therapy though today.  Patient will follow up with me again in 3 months otherwise.  Update 05/29/2019 KYNSLIE CALITRI is a 85 y.o. female coming in with complaint of right knee pain. Patient states that her pain has just started to increase. Is here       Past Medical History:  Diagnosis Date  . Hypertension    only one time  . Right knee pain   . Spinal stenosis of lumbar region    Past Surgical History:  Procedure Laterality Date  . HYSTERECTOMY ABDOMINAL WITH SALPINGECTOMY    . TONSILLECTOMY     Social History   Socioeconomic History  . Marital status: Widowed    Spouse name: Not on file  . Number of children: 3  . Years of education: 48  . Highest education level: High school graduate  Occupational History  . Not on file  Tobacco Use  . Smoking status: Never Smoker  . Smokeless tobacco: Never Used  Substance and Sexual Activity  . Alcohol use:  [name]    Alcohol/week: 0.0 standard drinks  . Drug use: [name]  . Sexual activity: Not Currently  Other Topics Concern  . Not on file  Social History Narrative   Pt lives alone in her 1 story home- she has 3 sons, and a high school graduate. She is right handed, she drinks coffee, and one soda a week mostly water   Social Determinants of Health   Financial Resource Strain:   . Difficulty of Paying Living Expenses:   Food Insecurity:   . Worried About Charity fundraiser in the Last Year:   . Arboriculturist in the Last Year:   Transportation Needs:   . Film/video editor (Medical):   Marland Kitchen Lack of Transportation (Non-Medical):   Physical Activity:   . Days of Exercise per Week:   . Minutes of Exercise per Session:   Stress:   . Feeling of Stress :   Social Connections:   . Frequency of Communication with Friends and Family:   . Frequency of Social Gatherings with Friends and Family:   . Attends Religious Services:   . Active Member of Clubs or Organizations:   . Attends Archivist Meetings:   Marland Kitchen Marital Status:    Allergies  Allergen Reactions  . Flagyl [Metronidazole] Rash    Possible cause of rash    Family History  Problem Relation Age of Onset  . Breast cancer Mother   . Melanoma  Father   . Healthy Son      Current Outpatient Medications (Cardiovascular):  .  rosuvastatin (CRESTOR) 5 MG tablet, 1 tab po qhs   Current Outpatient Medications (Analgesics):  .  aspirin 81 MG tablet, Take 325 mg by mouth daily.   Current Outpatient Medications (Hematological):  Marland Kitchen  Cyanocobalamin (VITAMIN B 12 PO), Take by mouth. 5, 000 mcg daily  Current Outpatient Medications (Other):  .  Biotin 10000 MCG TABS, Take by mouth. 1/2 tablet daily .  carbidopa-levodopa (SINEMET IR) 25-100 MG tablet, Take 1.5 tablets by mouth 3 (three) times daily. .  cholecalciferol (VITAMIN D) 25 MCG (1000 UT) tablet, Take 1,000 Units by mouth daily. .  ciclopirox (LOPROX) 0.77 % cream, Apply  twice a day to rash on the skin of the feet .  Coenzyme Q10 (COQ-10) 100 MG CAPS, Take 1 tablet by mouth daily. .  ergocalciferol (VITAMIN D2) 1.25 MG (50000 UT) capsule, ergocalciferol (vitamin D2) 1,250 mcg (50,000 unit) capsule .  Glucosamine-Chondroit-Vit C-Mn (GLUCOSAMINE 1500 COMPLEX PO), Glucosamine .  Misc Natural Products (TART CHERRY ADVANCED) CAPS, Take by mouth. 2500mg  daily .  Multiple Vitamins-Minerals (PRESERVISION AREDS 2 PO), Take by mouth daily. .  Omega-3 Fatty Acids (OMEGA-3 FISH OIL PO), Take 1 tablet by mouth daily. 500mg  .  Polyethylene Glycol 3350 (MIRALAX PO), Take by mouth daily. .  Turmeric 500 MG CAPS, Take by mouth daily. Marland Kitchen  UNABLE TO FIND, Take 2 tablets by mouth daily. Med Name: Cognium   Reviewed prior external information including notes and imaging from  primary care provider As well as notes that were available from care everywhere and other healthcare systems.  Past medical history, social, surgical and family history all reviewed in electronic medical record.  [name] pertanent information unless stated regarding to the chief complaint.   Review of Systems:  [name] headache, visual changes, nausea, vomiting, diarrhea, constipation, dizziness, abdominal pain, skin rash, fevers, chills, night sweats, weight loss, swollen lymph nodes, body aches, joint swelling, chest pain, shortness of breath, mood changes. POSITIVE muscle aches  Objective  Blood pressure 122/84, pulse 71, height 5\' 6"  (1.676 m), weight 170 lb (77.1 kg), SpO2 98 %.   General: [name] apparent distress alert and oriented x3 mood and affect normal, dressed appropriately.  Mild masked facies Knee: Right valgus deformity noted.  Abnormal thigh to calf ratio.  Tender to palpation over medial and PF joint line.  ROM full in flexion and extension and lower leg rotation. instability with valgus force.  painful patellar compression. Patellar glide with moderate crepitus. Patellar and quadriceps tendons  unremarkable. Hamstring and quadriceps strength is normal.  After informed written and verbal consent, patient was seated on exam table. Right knee was prepped with alcohol swab and utilizing anterolateral approach, patient's right knee space was injected with 4:1  marcaine 0.5%: Kenalog 40mg /dL. Patient tolerated the procedure well without immediate complications.    Impression and Recommendations:     This case required medical decision making of moderate complexity. The above documentation has been reviewed and is accurate and complete Lyndal Pulley, DO       Note: This dictation was prepared with Dragon dictation along with smaller phrase technology. Any transcriptional errors that result from this process are unintentional.

## 2019-05-29 ENCOUNTER — Encounter: Payer: Self-pay | Admitting: Family Medicine

## 2019-05-29 ENCOUNTER — Ambulatory Visit: Payer: Medicare PPO | Admitting: Family Medicine

## 2019-05-29 ENCOUNTER — Other Ambulatory Visit: Payer: Self-pay

## 2019-05-29 DIAGNOSIS — M1711 Unilateral primary osteoarthritis, right knee: Secondary | ICD-10-CM

## 2019-05-29 NOTE — Patient Instructions (Signed)
Good to see you Injected knee today See me in 8-10 weeks

## 2019-05-29 NOTE — Assessment & Plan Note (Signed)
Repeat injection given today, tolerated procedure well, discussed vitamin D supplementation and has been prescribed for her previously for her osteoarthritic change patient's social determinants of health including patient's Parkinson's and is unable to do certain things such as physical therapy secondary to some of the instability that she has otherwise.  Also she is not a surgical candidate.  Follow-up with me again in 8 to 10 weeks and can repeat injections as necessary

## 2019-06-05 DIAGNOSIS — B351 Tinea unguium: Secondary | ICD-10-CM | POA: Diagnosis not present

## 2019-06-05 DIAGNOSIS — B353 Tinea pedis: Secondary | ICD-10-CM | POA: Diagnosis not present

## 2019-06-05 DIAGNOSIS — L738 Other specified follicular disorders: Secondary | ICD-10-CM | POA: Diagnosis not present

## 2019-06-05 DIAGNOSIS — D1801 Hemangioma of skin and subcutaneous tissue: Secondary | ICD-10-CM | POA: Diagnosis not present

## 2019-06-05 DIAGNOSIS — C44319 Basal cell carcinoma of skin of other parts of face: Secondary | ICD-10-CM | POA: Diagnosis not present

## 2019-06-05 DIAGNOSIS — D2362 Other benign neoplasm of skin of left upper limb, including shoulder: Secondary | ICD-10-CM | POA: Diagnosis not present

## 2019-06-05 DIAGNOSIS — D2262 Melanocytic nevi of left upper limb, including shoulder: Secondary | ICD-10-CM | POA: Diagnosis not present

## 2019-06-05 DIAGNOSIS — L821 Other seborrheic keratosis: Secondary | ICD-10-CM | POA: Diagnosis not present

## 2019-06-05 DIAGNOSIS — Z85828 Personal history of other malignant neoplasm of skin: Secondary | ICD-10-CM | POA: Diagnosis not present

## 2019-06-05 DIAGNOSIS — D485 Neoplasm of uncertain behavior of skin: Secondary | ICD-10-CM | POA: Diagnosis not present

## 2019-07-01 DIAGNOSIS — Z85828 Personal history of other malignant neoplasm of skin: Secondary | ICD-10-CM | POA: Diagnosis not present

## 2019-07-01 DIAGNOSIS — C44319 Basal cell carcinoma of skin of other parts of face: Secondary | ICD-10-CM | POA: Diagnosis not present

## 2019-07-07 NOTE — Progress Notes (Signed)
Assessment/Plan:   1.  Parkinsons Disease/SWEDD  -Continue carbidopa/levodopa 25/100, 1.5 tablets 3 times per day.  -Encouraged her to rethink physical therapy, which she was agreeable to today.  We will send PT to the home - pt doesn't drive and is homebound 2.  Insomnia  -she is setting a regular schedule now.  -add melatonin, 3 mg 3.  Memory loss  -suspect maybe more than MCI but decided to hold on meds since starting Lexapro, which may treat any pseudodementia that may be present. 4.  Anxiety/depression  -Discussed treatment.  Patient agreeable to starting Lexapro, 10 mg daily.  R/B/SE were discussed.  The opportunity to ask questions was given and they were answered to the best of my ability.  The patient expressed understanding and willingness to follow the outlined treatment protocols.    Subjective:   Jamie [name] was seen today in follow up for Parkinsons disease/SWEDD.  My previous records were reviewed prior to todays visit as well as outside records available to me. This patient is accompanied in the office by her child who supplements the history.  Pt denies falls.  She declined physical therapy last visit.  Pt denies lightheadedness, near syncope.  No hallucinations per pt but daughter states that she will hear someone knocking on door at night and pt states that she doesn't see anyone and doesn't get up because "they would see me."  There is some fear with it - "I just pray and ask God to take care of me."  Having memory trouble.  no problems paying bills.  Having some word finding.  Does own pill box.  daughter states that they tell patient to write down reminders more.  Having trouble sleeping - doing better with schedule.  Not taking melatonin  Current prescribed movement disorder medications: Carbidopa/levodopa 25/100, 1.5 tablets 3 times per day (increased last visit)     ALLERGIES:   Allergies  Allergen Reactions  . Flagyl [Metronidazole] Rash    Possible  cause of rash     CURRENT MEDICATIONS:  Outpatient Encounter Medications as of 07/10/2019  Medication Sig  . aspirin 81 MG tablet Take 325 mg by mouth daily.   . Biotin 10000 MCG TABS Take by mouth. 1/2 tablet daily  . carbidopa-levodopa (SINEMET IR) 25-100 MG tablet Take 1.5 tablets by mouth 3 (three) times daily.  . ciclopirox (LOPROX) 0.77 % cream Apply twice a day to rash on the skin of the feet  . Coenzyme Q10 (COQ-10) 100 MG CAPS Take 1 tablet by mouth daily.  . Cyanocobalamin (VITAMIN B 12 PO) Take by mouth. 5, 000 mcg daily  . Glucosamine-Chondroit-Vit C-Mn (GLUCOSAMINE 1500 COMPLEX PO) Glucosamine  . Misc Natural Products (TART CHERRY ADVANCED) CAPS Take by mouth. 2500mg  daily  . Multiple Vitamin (MULTIVITAMIN WITH MINERALS) TABS tablet Take 1 tablet by mouth daily.  . Multiple Vitamins-Minerals (PRESERVISION AREDS 2 PO) Take 1 tablet by mouth in the morning and at bedtime.   . rosuvastatin (CRESTOR) 5 MG tablet 1 tab po qhs (Patient taking differently: 7.5 mg. 1 tab po qhs)  . Turmeric 500 MG CAPS Take by mouth daily.  . [DISCONTINUED] cholecalciferol (VITAMIN D) 25 MCG (1000 UT) tablet Take 1,000 Units by mouth daily.  . [DISCONTINUED] ergocalciferol (VITAMIN D2) 1.25 MG (50000 UT) capsule ergocalciferol (vitamin D2) 1,250 mcg (50,000 unit) capsule  . [DISCONTINUED] Omega-3 Fatty Acids (OMEGA-3 FISH OIL PO) Take 1 tablet by mouth daily. 500mg   . [DISCONTINUED] Polyethylene Glycol 3350 (MIRALAX  PO) Take by mouth daily.  . [DISCONTINUED] UNABLE TO FIND Take 2 tablets by mouth daily. Med Name: Cognium   No facility-administered encounter medications on file as of 07/10/2019.    Objective:   PHYSICAL EXAMINATION:    VITALS:   Vitals:   07/10/19 0931  BP: 137/82  Pulse: 81  SpO2: 97%  Weight: 167 lb (75.8 kg)  Height: 5\' 6"  (1.676 m)    GEN:  The patient appears stated age and is in NAD. HEENT:  Normocephalic, atraumatic.  The mucous membranes are moist. The superficial  temporal arteries are without ropiness or tenderness. CV:  RRR Lungs:  CTAB Neck/HEME:  There are no carotid bruits bilaterally.  Neurological examination:  Orientation: The patient is alert and oriented x3. Cranial nerves: There is good facial symmetry with significant facial hypomimia. The speech is fluent and clear. Soft palate rises symmetrically and there is no tongue deviation. Hearing is intact to conversational tone. Sensation: Sensation is intact to light touch throughout Motor: Strength is at least antigravity x4.  Movement examination: Tone: There is normal tone in the upper and lower extremities Abnormal movements: None Coordination:  There is  decremation with RAM's, on the left Gait and Station: The patient has no difficulty arising out of a deep-seated chair without the use of the hands. The patient's stride length is short stepped with decreased arm swing on the left.    I have reviewed and interpreted the following labs independently    Chemistry      Component Value Date/Time   NA 139 12/09/2018 0921   K 4.5 12/09/2018 0921   CL 102 12/09/2018 0921   CO2 22 12/09/2018 0921   BUN 12 12/09/2018 0921   CREATININE 0.80 12/09/2018 0921   GLU 98 05/30/2017 0000      Component Value Date/Time   CALCIUM 10.8 (H) 12/09/2018 0921   ALKPHOS 104 12/09/2018 0921   AST 22 12/09/2018 0921   ALT 5 05/07/2019 0831   BILITOT 0.4 12/09/2018 0921       Lab Results  Component Value Date   WBC 6.0 12/09/2018   HGB 13.9 12/09/2018   HCT 41.0 12/09/2018   MCV 90 12/09/2018   PLT 230 12/09/2018    Lab Results  Component Value Date   TSH 3.760 12/09/2018     Total time spent on today's visit was 40 minutes, including both face-to-face time and nonface-to-face time.  Time included that spent on review of records (prior notes available to me/labs/imaging if pertinent), discussing treatment and goals, answering patient's questions and coordinating care.  Cc:  Lorrene Reid, PA-C

## 2019-07-10 ENCOUNTER — Encounter: Payer: Self-pay | Admitting: Neurology

## 2019-07-10 ENCOUNTER — Ambulatory Visit: Payer: Medicare PPO | Admitting: Neurology

## 2019-07-10 ENCOUNTER — Other Ambulatory Visit: Payer: Self-pay

## 2019-07-10 VITALS — BP 137/82 | HR 81 | Ht 66.0 in | Wt 167.0 lb

## 2019-07-10 DIAGNOSIS — F330 Major depressive disorder, recurrent, mild: Secondary | ICD-10-CM

## 2019-07-10 DIAGNOSIS — R413 Other amnesia: Secondary | ICD-10-CM | POA: Diagnosis not present

## 2019-07-10 DIAGNOSIS — F5101 Primary insomnia: Secondary | ICD-10-CM

## 2019-07-10 DIAGNOSIS — G20 Parkinson's disease: Secondary | ICD-10-CM | POA: Diagnosis not present

## 2019-07-10 MED ORDER — ESCITALOPRAM OXALATE 10 MG PO TABS: 10.0000 mg | ORAL_TABLET | Freq: Every day | ORAL | 1 refills | Status: DC

## 2019-07-10 NOTE — Patient Instructions (Addendum)
1.  Add melatonin, 3 mg at bedtime 2.  Start lexapro, 10mg , one tablet daily for mood 3.  We will order home PT 4.  I'm proud of you!!!  The physicians and staff at [facility] are committed to providing excellent care. You may receive a survey requesting feedback about your experience at our office. We strive to receive "very good" responses to the survey questions. If you feel that your experience would prevent you from giving the office a "very good " response, please contact our office to try to remedy the situation. We may be reached at [phone]. Thank you for taking the time out of your busy day to complete the survey.

## 2019-07-14 ENCOUNTER — Telehealth: Payer: Self-pay | Admitting: Neurology

## 2019-07-14 NOTE — Telephone Encounter (Signed)
Pt daughter is calling and needs to speak to someone about her mother. She states that we did a referral for Home health  PT  And now they are getting phone calls from a couple of different places and she wasn't sure which one to use

## 2019-07-14 NOTE — Telephone Encounter (Signed)
Spoke with patients daughter and she states she was receiving calls from multiple schedulers from [facility] of physical therapy. She states she has everything figured out and thanked me for returning her call.

## 2019-07-25 DIAGNOSIS — F419 Anxiety disorder, unspecified: Secondary | ICD-10-CM | POA: Diagnosis not present

## 2019-07-25 DIAGNOSIS — F5101 Primary insomnia: Secondary | ICD-10-CM | POA: Diagnosis not present

## 2019-07-25 DIAGNOSIS — M48061 Spinal stenosis, lumbar region without neurogenic claudication: Secondary | ICD-10-CM | POA: Diagnosis not present

## 2019-07-25 DIAGNOSIS — G20 Parkinson's disease: Secondary | ICD-10-CM | POA: Diagnosis not present

## 2019-07-25 DIAGNOSIS — F330 Major depressive disorder, recurrent, mild: Secondary | ICD-10-CM | POA: Diagnosis not present

## 2019-07-25 DIAGNOSIS — M1711 Unilateral primary osteoarthritis, right knee: Secondary | ICD-10-CM | POA: Diagnosis not present

## 2019-07-25 DIAGNOSIS — I10 Essential (primary) hypertension: Secondary | ICD-10-CM | POA: Diagnosis not present

## 2019-07-25 DIAGNOSIS — E559 Vitamin D deficiency, unspecified: Secondary | ICD-10-CM | POA: Diagnosis not present

## 2019-07-25 DIAGNOSIS — M4696 Unspecified inflammatory spondylopathy, lumbar region: Secondary | ICD-10-CM | POA: Diagnosis not present

## 2019-07-30 DIAGNOSIS — E559 Vitamin D deficiency, unspecified: Secondary | ICD-10-CM | POA: Diagnosis not present

## 2019-07-30 DIAGNOSIS — F419 Anxiety disorder, unspecified: Secondary | ICD-10-CM | POA: Diagnosis not present

## 2019-07-30 DIAGNOSIS — F5101 Primary insomnia: Secondary | ICD-10-CM | POA: Diagnosis not present

## 2019-07-30 DIAGNOSIS — M48061 Spinal stenosis, lumbar region without neurogenic claudication: Secondary | ICD-10-CM | POA: Diagnosis not present

## 2019-07-30 DIAGNOSIS — G20 Parkinson's disease: Secondary | ICD-10-CM | POA: Diagnosis not present

## 2019-07-30 DIAGNOSIS — F330 Major depressive disorder, recurrent, mild: Secondary | ICD-10-CM | POA: Diagnosis not present

## 2019-07-30 DIAGNOSIS — M1711 Unilateral primary osteoarthritis, right knee: Secondary | ICD-10-CM | POA: Diagnosis not present

## 2019-07-30 DIAGNOSIS — M4696 Unspecified inflammatory spondylopathy, lumbar region: Secondary | ICD-10-CM | POA: Diagnosis not present

## 2019-07-30 DIAGNOSIS — I10 Essential (primary) hypertension: Secondary | ICD-10-CM | POA: Diagnosis not present

## 2019-07-31 ENCOUNTER — Ambulatory Visit: Payer: Medicare PPO | Admitting: Family Medicine

## 2019-07-31 ENCOUNTER — Other Ambulatory Visit: Payer: Self-pay

## 2019-07-31 ENCOUNTER — Encounter: Payer: Self-pay | Admitting: Family Medicine

## 2019-07-31 DIAGNOSIS — M1711 Unilateral primary osteoarthritis, right knee: Secondary | ICD-10-CM

## 2019-07-31 NOTE — Progress Notes (Signed)
[facility] [address] [place] Phone: [phone] Subjective:   I Jamie [name] am serving as a Education administrator for Dr. Hulan Saas.  This visit occurred during the SARS-CoV-2 public health emergency.  Safety protocols were in place, including screening questions prior to the visit, additional usage of staff PPE, and extensive cleaning of exam room while observing appropriate contact time as indicated for disinfecting solutions.   I'm seeing this patient by the request  of:  Lorrene Reid, PA-C  CC:   [id number]   05/29/2019 Repeat injection given today, tolerated procedure well, discussed vitamin D supplementation and has been prescribed for her previously for her osteoarthritic change patient's social determinants of health including patient's Parkinson's and is unable to do certain things such as physical therapy secondary to some of the instability that she has otherwise.  Also she is not a surgical candidate.  Follow-up with me again in 8 to 10 weeks and can repeat injections as necessary  Update 07/31/2019 Jamie [name] is a 85 y.o. female coming in with complaint of right knee pain. Patient states it is doing ok. It pops and cracks and she feels pain. During PT it was popping a lot.   Onset-  Location Duration-  Character- Aggravating factors- Reliving factors-  Therapies tried-  Severity-     Past Medical History:  Diagnosis Date  . Hypertension    only one time  . Right knee pain   . Spinal stenosis of lumbar region    Past Surgical History:  Procedure Laterality Date  . HYSTERECTOMY ABDOMINAL WITH SALPINGECTOMY    . TONSILLECTOMY     Social History   Socioeconomic History  . Marital status: Widowed    Spouse name: Not on file  . Number of children: 3  . Years of education: 75  . Highest education level: High school graduate  Occupational History  . Not on file  Tobacco Use  . Smoking status: Never Smoker   . Smokeless tobacco: Never Used  Vaping Use  . Vaping Use: Never used  Substance and Sexual Activity  . Alcohol use: No    Alcohol/week: 0.0 standard drinks  . Drug use: No  . Sexual activity: Not Currently  Other Topics Concern  . Not on file  Social History Narrative   Pt lives alone in her 1 story home- she has 3 sons, and a high school graduate. She is right handed, she drinks coffee, and one soda a week mostly water   Social Determinants of Health   Financial Resource Strain:   . Difficulty of Paying Living Expenses:   Food Insecurity:   . Worried About Charity fundraiser in the Last Year:   . Arboriculturist in the Last Year:   Transportation Needs:   . Film/video editor (Medical):   Marland Kitchen Lack of Transportation (Non-Medical):   Physical Activity:   . Days of Exercise per Week:   . Minutes of Exercise per Session:   Stress:   . Feeling of Stress :   Social Connections:   . Frequency of Communication with Friends and Family:   . Frequency of Social Gatherings with Friends and Family:   . Attends Religious Services:   . Active Member of Clubs or Organizations:   . Attends Archivist Meetings:   Marland Kitchen Marital Status:    Allergies  Allergen Reactions  . Flagyl [Metronidazole] Rash    Possible cause of rash  Family History  Problem Relation Age of Onset  . Breast cancer Mother   . Melanoma Father   . Healthy Son      Current Outpatient Medications (Cardiovascular):  .  rosuvastatin (CRESTOR) 5 MG tablet, 1 tab po qhs (Patient taking differently: 7.5 mg. 1 tab po qhs)   Current Outpatient Medications (Analgesics):  .  aspirin 81 MG tablet, Take 325 mg by mouth daily.   Current Outpatient Medications (Hematological):  Marland Kitchen  Cyanocobalamin (VITAMIN B 12 PO), Take by mouth. 5, 000 mcg daily  Current Outpatient Medications (Other):  .  Biotin 10000 MCG TABS, Take by mouth. 1/2 tablet daily .  carbidopa-levodopa (SINEMET IR) 25-100 MG tablet, Take 1.5  tablets by mouth 3 (three) times daily. .  ciclopirox (LOPROX) 0.77 % cream, Apply twice a day to rash on the skin of the feet .  Coenzyme Q10 (COQ-10) 100 MG CAPS, Take 1 tablet by mouth daily. Marland Kitchen  escitalopram (LEXAPRO) 10 MG tablet, Take 1 tablet (10 mg total) by mouth daily. .  Glucosamine-Chondroit-Vit C-Mn (GLUCOSAMINE 1500 COMPLEX PO), Glucosamine .  Misc Natural Products (TART CHERRY ADVANCED) CAPS, Take by mouth. 2500mg  daily .  Multiple Vitamin (MULTIVITAMIN WITH MINERALS) TABS tablet, Take 1 tablet by mouth daily. .  Multiple Vitamins-Minerals (PRESERVISION AREDS 2 PO), Take 1 tablet by mouth in the morning and at bedtime.  .  Turmeric 500 MG CAPS, Take by mouth daily.   Reviewed prior external information including notes and imaging from  primary care provider As well as notes that were available from care everywhere and other healthcare systems.  Past medical history, social, surgical and family history all reviewed in electronic medical record.  No pertanent information unless stated regarding to the chief complaint.   Review of Systems:  No headache, visual changes, nausea, vomiting, diarrhea, constipation, dizziness, abdominal pain, skin rash, fevers, chills, night sweats, weight loss, swollen lymph nodes, body aches, joint swelling, chest pain, shortness of breath, mood changes. POSITIVE muscle aches  Objective  There were no vitals taken for this visit.   General: No apparent distress alert and oriented x3 mood and affect normal, dressed appropriately.  Mild masked facies HEENT: Pupils equal, extraocular movements intact  Respiratory: Patient's speak in full sentences and does not appear short of breath  Gait gait shuffling MSK:  Knee right OA severe,, ttp medial an pfj, instability noted     After informed written and verbal consent, patient was seated on exam table. Right knee was prepped with alcohol swab and utilizing anterolateral approach, patient's right knee  space was injected with 4:1  marcaine 0.5%: Kenalog 40mg /dL. Patient tolerated the procedure well without immediate complications. Impression and Recommendations:     The above documentation has been reviewed and is accurate and complete Lyndal Pulley, DO       Note: This dictation was prepared with Dragon dictation along with smaller phrase technology. Any transcriptional errors that result from this process are unintentional.

## 2019-07-31 NOTE — Patient Instructions (Addendum)
Good to see you See me again in 8-10 weeks 

## 2019-08-01 ENCOUNTER — Encounter: Payer: Self-pay | Admitting: Family Medicine

## 2019-08-01 NOTE — Assessment & Plan Note (Signed)
Injection again, chronic with exacerbatin, discussed HEP, discussed bracing, consider visco again or PRP, otherwise repeat injection every 8-10 weeks

## 2019-08-07 DIAGNOSIS — M4696 Unspecified inflammatory spondylopathy, lumbar region: Secondary | ICD-10-CM | POA: Diagnosis not present

## 2019-08-07 DIAGNOSIS — E559 Vitamin D deficiency, unspecified: Secondary | ICD-10-CM | POA: Diagnosis not present

## 2019-08-07 DIAGNOSIS — F419 Anxiety disorder, unspecified: Secondary | ICD-10-CM | POA: Diagnosis not present

## 2019-08-07 DIAGNOSIS — F5101 Primary insomnia: Secondary | ICD-10-CM | POA: Diagnosis not present

## 2019-08-07 DIAGNOSIS — F330 Major depressive disorder, recurrent, mild: Secondary | ICD-10-CM | POA: Diagnosis not present

## 2019-08-07 DIAGNOSIS — M48061 Spinal stenosis, lumbar region without neurogenic claudication: Secondary | ICD-10-CM | POA: Diagnosis not present

## 2019-08-07 DIAGNOSIS — I10 Essential (primary) hypertension: Secondary | ICD-10-CM | POA: Diagnosis not present

## 2019-08-07 DIAGNOSIS — G20 Parkinson's disease: Secondary | ICD-10-CM | POA: Diagnosis not present

## 2019-08-07 DIAGNOSIS — M1711 Unilateral primary osteoarthritis, right knee: Secondary | ICD-10-CM | POA: Diagnosis not present

## 2019-08-13 DIAGNOSIS — M1711 Unilateral primary osteoarthritis, right knee: Secondary | ICD-10-CM | POA: Diagnosis not present

## 2019-08-13 DIAGNOSIS — E559 Vitamin D deficiency, unspecified: Secondary | ICD-10-CM | POA: Diagnosis not present

## 2019-08-13 DIAGNOSIS — F419 Anxiety disorder, unspecified: Secondary | ICD-10-CM | POA: Diagnosis not present

## 2019-08-13 DIAGNOSIS — F5101 Primary insomnia: Secondary | ICD-10-CM | POA: Diagnosis not present

## 2019-08-13 DIAGNOSIS — M4696 Unspecified inflammatory spondylopathy, lumbar region: Secondary | ICD-10-CM | POA: Diagnosis not present

## 2019-08-13 DIAGNOSIS — F330 Major depressive disorder, recurrent, mild: Secondary | ICD-10-CM | POA: Diagnosis not present

## 2019-08-13 DIAGNOSIS — I10 Essential (primary) hypertension: Secondary | ICD-10-CM | POA: Diagnosis not present

## 2019-08-13 DIAGNOSIS — M48061 Spinal stenosis, lumbar region without neurogenic claudication: Secondary | ICD-10-CM | POA: Diagnosis not present

## 2019-08-13 DIAGNOSIS — G20 Parkinson's disease: Secondary | ICD-10-CM | POA: Diagnosis not present

## 2019-08-20 DIAGNOSIS — E559 Vitamin D deficiency, unspecified: Secondary | ICD-10-CM | POA: Diagnosis not present

## 2019-08-20 DIAGNOSIS — M4696 Unspecified inflammatory spondylopathy, lumbar region: Secondary | ICD-10-CM | POA: Diagnosis not present

## 2019-08-20 DIAGNOSIS — I10 Essential (primary) hypertension: Secondary | ICD-10-CM | POA: Diagnosis not present

## 2019-08-20 DIAGNOSIS — G20 Parkinson's disease: Secondary | ICD-10-CM | POA: Diagnosis not present

## 2019-08-20 DIAGNOSIS — F419 Anxiety disorder, unspecified: Secondary | ICD-10-CM | POA: Diagnosis not present

## 2019-08-20 DIAGNOSIS — M1711 Unilateral primary osteoarthritis, right knee: Secondary | ICD-10-CM | POA: Diagnosis not present

## 2019-08-20 DIAGNOSIS — F5101 Primary insomnia: Secondary | ICD-10-CM | POA: Diagnosis not present

## 2019-08-20 DIAGNOSIS — F330 Major depressive disorder, recurrent, mild: Secondary | ICD-10-CM | POA: Diagnosis not present

## 2019-08-20 DIAGNOSIS — M48061 Spinal stenosis, lumbar region without neurogenic claudication: Secondary | ICD-10-CM | POA: Diagnosis not present

## 2019-08-27 DIAGNOSIS — M4696 Unspecified inflammatory spondylopathy, lumbar region: Secondary | ICD-10-CM | POA: Diagnosis not present

## 2019-08-27 DIAGNOSIS — F419 Anxiety disorder, unspecified: Secondary | ICD-10-CM | POA: Diagnosis not present

## 2019-08-27 DIAGNOSIS — F330 Major depressive disorder, recurrent, mild: Secondary | ICD-10-CM | POA: Diagnosis not present

## 2019-08-27 DIAGNOSIS — F5101 Primary insomnia: Secondary | ICD-10-CM | POA: Diagnosis not present

## 2019-08-27 DIAGNOSIS — M1711 Unilateral primary osteoarthritis, right knee: Secondary | ICD-10-CM | POA: Diagnosis not present

## 2019-08-27 DIAGNOSIS — I10 Essential (primary) hypertension: Secondary | ICD-10-CM | POA: Diagnosis not present

## 2019-08-27 DIAGNOSIS — M48061 Spinal stenosis, lumbar region without neurogenic claudication: Secondary | ICD-10-CM | POA: Diagnosis not present

## 2019-08-27 DIAGNOSIS — E559 Vitamin D deficiency, unspecified: Secondary | ICD-10-CM | POA: Diagnosis not present

## 2019-08-27 DIAGNOSIS — G20 Parkinson's disease: Secondary | ICD-10-CM | POA: Diagnosis not present

## 2019-08-30 ENCOUNTER — Other Ambulatory Visit: Payer: Self-pay | Admitting: Neurology

## 2019-08-30 ENCOUNTER — Other Ambulatory Visit: Payer: Self-pay | Admitting: Physician Assistant

## 2019-09-03 DIAGNOSIS — M48061 Spinal stenosis, lumbar region without neurogenic claudication: Secondary | ICD-10-CM | POA: Diagnosis not present

## 2019-09-03 DIAGNOSIS — M4696 Unspecified inflammatory spondylopathy, lumbar region: Secondary | ICD-10-CM | POA: Diagnosis not present

## 2019-09-03 DIAGNOSIS — F5101 Primary insomnia: Secondary | ICD-10-CM | POA: Diagnosis not present

## 2019-09-03 DIAGNOSIS — I10 Essential (primary) hypertension: Secondary | ICD-10-CM | POA: Diagnosis not present

## 2019-09-03 DIAGNOSIS — E559 Vitamin D deficiency, unspecified: Secondary | ICD-10-CM | POA: Diagnosis not present

## 2019-09-03 DIAGNOSIS — G20 Parkinson's disease: Secondary | ICD-10-CM | POA: Diagnosis not present

## 2019-09-03 DIAGNOSIS — F419 Anxiety disorder, unspecified: Secondary | ICD-10-CM | POA: Diagnosis not present

## 2019-09-03 DIAGNOSIS — F330 Major depressive disorder, recurrent, mild: Secondary | ICD-10-CM | POA: Diagnosis not present

## 2019-09-03 DIAGNOSIS — M1711 Unilateral primary osteoarthritis, right knee: Secondary | ICD-10-CM | POA: Diagnosis not present

## 2019-09-11 ENCOUNTER — Ambulatory Visit: Payer: Medicare PPO | Admitting: Physician Assistant

## 2019-09-11 ENCOUNTER — Other Ambulatory Visit: Payer: Self-pay

## 2019-09-11 ENCOUNTER — Encounter: Payer: Self-pay | Admitting: Physician Assistant

## 2019-09-11 VITALS — BP 127/73 | HR 84 | Ht 66.0 in | Wt 168.4 lb

## 2019-09-11 DIAGNOSIS — R5383 Other fatigue: Secondary | ICD-10-CM

## 2019-09-11 DIAGNOSIS — R630 Anorexia: Secondary | ICD-10-CM

## 2019-09-11 DIAGNOSIS — G20 Parkinson's disease: Secondary | ICD-10-CM | POA: Diagnosis not present

## 2019-09-11 DIAGNOSIS — I10 Essential (primary) hypertension: Secondary | ICD-10-CM

## 2019-09-11 DIAGNOSIS — E782 Mixed hyperlipidemia: Secondary | ICD-10-CM

## 2019-09-11 NOTE — Assessment & Plan Note (Signed)
-   Last lipid panel wnl's. - Continue Crestor 5 mg. - Continue heart healthy diet and stay as active as possible.

## 2019-09-11 NOTE — Patient Instructions (Signed)
DASH Eating Plan DASH stands for "Dietary Approaches to Stop Hypertension." The DASH eating plan is a healthy eating plan that has been shown to reduce high blood pressure (hypertension). It may also reduce your risk for type 2 diabetes, heart disease, and stroke. The DASH eating plan may also help with weight loss. What are tips for following this plan?  General guidelines  Avoid eating more than 2,300 mg (milligrams) of salt (sodium) a day. If you have hypertension, you may need to reduce your sodium intake to 1,500 mg a day.  Limit alcohol intake to no more than 1 drink a day for nonpregnant women and 2 drinks a day for men. One drink equals 12 oz of beer, 5 oz of wine, or 1 oz of hard liquor.  Work with your health care provider to maintain a healthy body weight or to lose weight. Ask what an ideal weight is for you.  Get at least 30 minutes of exercise that causes your heart to beat faster (aerobic exercise) most days of the week. Activities may include walking, swimming, or biking.  Work with your health care provider or diet and nutrition specialist (dietitian) to adjust your eating plan to your individual calorie needs. Reading food labels   Check food labels for the amount of sodium per serving. Choose foods with less than 5 percent of the Daily Value of sodium. Generally, foods with less than 300 mg of sodium per serving fit into this eating plan.  To find whole grains, look for the word "whole" as the first word in the ingredient list. Shopping  Buy products labeled as "low-sodium" or "no salt added."  Buy fresh foods. Avoid canned foods and premade or frozen meals. Cooking  Avoid adding salt when cooking. Use salt-free seasonings or herbs instead of table salt or sea salt. Check with your health care provider or pharmacist before using salt substitutes.  Do not fry foods. Cook foods using healthy methods such as baking, boiling, grilling, and broiling instead.  Cook with  heart-healthy oils, such as olive, canola, soybean, or sunflower oil. Meal planning  Eat a balanced diet that includes: ? 5 or more servings of fruits and vegetables each day. At each meal, try to fill half of your plate with fruits and vegetables. ? Up to 6-8 servings of whole grains each day. ? Less than 6 oz of lean meat, poultry, or fish each day. A 3-oz serving of meat is about the same size as a deck of cards. One egg equals 1 oz. ? 2 servings of low-fat dairy each day. ? A serving of nuts, seeds, or beans 5 times each week. ? Heart-healthy fats. Healthy fats called Omega-3 fatty acids are found in foods such as flaxseeds and coldwater fish, like sardines, salmon, and mackerel.  Limit how much you eat of the following: ? Canned or prepackaged foods. ? Food that is high in trans fat, such as fried foods. ? Food that is high in saturated fat, such as fatty meat. ? Sweets, desserts, sugary drinks, and other foods with added sugar. ? Full-fat dairy products.  Do not salt foods before eating.  Try to eat at least 2 vegetarian meals each week.  Eat more home-cooked food and less restaurant, buffet, and fast food.  When eating at a restaurant, ask that your food be prepared with less salt or no salt, if possible. What foods are recommended? The items listed may not be a complete list. Talk with your dietitian about   what dietary choices are best for you. Grains Whole-grain or whole-wheat bread. Whole-grain or whole-wheat pasta. Brown rice. Oatmeal. Quinoa. Bulgur. Whole-grain and low-sodium cereals. Pita bread. Low-fat, low-sodium crackers. Whole-wheat flour tortillas. Vegetables Fresh or frozen vegetables (raw, steamed, roasted, or grilled). Low-sodium or reduced-sodium tomato and vegetable juice. Low-sodium or reduced-sodium tomato sauce and tomato paste. Low-sodium or reduced-sodium canned vegetables. Fruits All fresh, dried, or frozen fruit. Canned fruit in natural juice (without  added sugar). Meat and other protein foods Skinless chicken or turkey. Ground chicken or turkey. Pork with fat trimmed off. Fish and seafood. Egg whites. Dried beans, peas, or lentils. Unsalted nuts, nut butters, and seeds. Unsalted canned beans. Lean cuts of beef with fat trimmed off. Low-sodium, lean deli meat. Dairy Low-fat (1%) or fat-free (skim) milk. Fat-free, low-fat, or reduced-fat cheeses. Nonfat, low-sodium ricotta or cottage cheese. Low-fat or nonfat yogurt. Low-fat, low-sodium cheese. Fats and oils Soft margarine without trans fats. Vegetable oil. Low-fat, reduced-fat, or light mayonnaise and salad dressings (reduced-sodium). Canola, safflower, olive, soybean, and sunflower oils. Avocado. Seasoning and other foods Herbs. Spices. Seasoning mixes without salt. Unsalted popcorn and pretzels. Fat-free sweets. What foods are not recommended? The items listed may not be a complete list. Talk with your dietitian about what dietary choices are best for you. Grains Baked goods made with fat, such as croissants, muffins, or some breads. Dry pasta or rice meal packs. Vegetables Creamed or fried vegetables. Vegetables in a cheese sauce. Regular canned vegetables (not low-sodium or reduced-sodium). Regular canned tomato sauce and paste (not low-sodium or reduced-sodium). Regular tomato and vegetable juice (not low-sodium or reduced-sodium). Pickles. Olives. Fruits Canned fruit in a light or heavy syrup. Fried fruit. Fruit in cream or butter sauce. Meat and other protein foods Fatty cuts of meat. Ribs. Fried meat. Bacon. Sausage. Bologna and other processed lunch meats. Salami. Fatback. Hotdogs. Bratwurst. Salted nuts and seeds. Canned beans with added salt. Canned or smoked fish. Whole eggs or egg yolks. Chicken or turkey with skin. Dairy Whole or 2% milk, cream, and half-and-half. Whole or full-fat cream cheese. Whole-fat or sweetened yogurt. Full-fat cheese. Nondairy creamers. Whipped toppings.  Processed cheese and cheese spreads. Fats and oils Butter. Stick margarine. Lard. Shortening. Ghee. Bacon fat. Tropical oils, such as coconut, palm kernel, or palm oil. Seasoning and other foods Salted popcorn and pretzels. Onion salt, garlic salt, seasoned salt, table salt, and sea salt. Worcestershire sauce. Tartar sauce. Barbecue sauce. Teriyaki sauce. Soy sauce, including reduced-sodium. Steak sauce. Canned and packaged gravies. Fish sauce. Oyster sauce. Cocktail sauce. Horseradish that you find on the shelf. Ketchup. Mustard. Meat flavorings and tenderizers. Bouillon cubes. Hot sauce and Tabasco sauce. Premade or packaged marinades. Premade or packaged taco seasonings. Relishes. Regular salad dressings. Where to find more information:  National Heart, Lung, and Blood Institute: www.nhlbi.nih.gov  American Heart Association: www.heart.org Summary  The DASH eating plan is a healthy eating plan that has been shown to reduce high blood pressure (hypertension). It may also reduce your risk for type 2 diabetes, heart disease, and stroke.  With the DASH eating plan, you should limit salt (sodium) intake to 2,300 mg a day. If you have hypertension, you may need to reduce your sodium intake to 1,500 mg a day.  When on the DASH eating plan, aim to eat more fresh fruits and vegetables, whole grains, lean proteins, low-fat dairy, and heart-healthy fats.  Work with your health care provider or diet and nutrition specialist (dietitian) to adjust your eating plan to your   individual calorie needs. This information is not intended to replace advice given to you by your health care provider. Make sure you discuss any questions you have with your health care provider. Document Revised: 01/19/2017 Document Reviewed: 01/31/2016 Elsevier Patient Education  2020 [employer]  

## 2019-09-11 NOTE — Progress Notes (Signed)
Established Patient Office Visit  Subjective:  Patient ID: Jamie [name], female    DOB: [birth year]  Age: 85 y.o. MRN: [id number]  CC:  Chief Complaint  Patient presents with  . Hypertension  . Hyperlipidemia    HPI Jamie [name] presents for follow-up on hypertension and hyperlipidemia.  HTN: Pt denies chest pain, palpitations, dizziness or lower extremity swelling. Doesn't checks BP at home and hasn't felt like her BP has been elevated. Pt follows a low salt diet.  Reports good hydration.  HLD: Pt taking medication as directed without issues. Denies side effects including myalgias and RUQ pain.  Reports she does monitor what she eats and follows a diet low in cholesterol.  Fatigue: Patient reports feeling tired. She is followed by neurology-Dr. Tat for Parkinson's and ordered PT to see if that would help with feeling tired/weak. She recently completed PT therapy and sometimes forgets to do exercises at home.  States Dr. Carles Collet attributes patient's fatigue to Parkinson's and medication regimen.  Patient denies weight changes, melena, hematochezia, or hair thinning/loss.  She also reports decreased appetite. She does drink 1 Carnation drink a day.  Past Medical History:  Diagnosis Date  . Hypertension    only one time  . Right knee pain   . Spinal stenosis of lumbar region     Past Surgical History:  Procedure Laterality Date  . HYSTERECTOMY ABDOMINAL WITH SALPINGECTOMY    . TONSILLECTOMY      Family History  Problem Relation Age of Onset  . Breast cancer Mother   . Melanoma Father   . Healthy Son     Social History   Socioeconomic History  . Marital status: Widowed    Spouse name: Not on file  . Number of children: 3  . Years of education: 56  . Highest education level: High school graduate  Occupational History  . Not on file  Tobacco Use  . Smoking status: Never Smoker  . Smokeless tobacco: Never Used  Vaping Use  . Vaping Use: Never used  Substance and  Sexual Activity  . Alcohol use: No    Alcohol/week: 0.0 standard drinks  . Drug use: No  . Sexual activity: Not Currently  Other Topics Concern  . Not on file  Social History Narrative   Pt lives alone in her 1 story home- she has 3 sons, and a high school graduate. She is right handed, she drinks coffee, and one soda a week mostly water   Social Determinants of Health   Financial Resource Strain:   . Difficulty of Paying Living Expenses:   Food Insecurity:   . Worried About Charity fundraiser in the Last Year:   . Arboriculturist in the Last Year:   Transportation Needs:   . Film/video editor (Medical):   Marland Kitchen Lack of Transportation (Non-Medical):   Physical Activity:   . Days of Exercise per Week:   . Minutes of Exercise per Session:   Stress:   . Feeling of Stress :   Social Connections:   . Frequency of Communication with Friends and Family:   . Frequency of Social Gatherings with Friends and Family:   . Attends Religious Services:   . Active Member of Clubs or Organizations:   . Attends Archivist Meetings:   Marland Kitchen Marital Status:   Intimate Partner Violence:   . Fear of Current or Ex-Partner:   . Emotionally Abused:   Marland Kitchen Physically Abused:   .  Sexually Abused:     Outpatient Medications Prior to Visit  Medication Sig Dispense Refill  . aspirin 81 MG tablet Take 325 mg by mouth daily.     . Biotin 10000 MCG TABS Take by mouth. 1/2 tablet daily    . carbidopa-levodopa (SINEMET IR) 25-100 MG tablet TAKE 1 AND 1/2 TABLETS BY MOUTH 3 TIMES DAILY. 405 tablet 1  . ciclopirox (LOPROX) 0.77 % cream Apply twice a day to rash on the skin of the feet 90 g 0  . Coenzyme Q10 (COQ-10) 100 MG CAPS Take 1 tablet by mouth daily.    . Cyanocobalamin (VITAMIN B 12 PO) Take by mouth. 5, 000 mcg daily    . escitalopram (LEXAPRO) 10 MG tablet Take 1 tablet (10 mg total) by mouth daily. 90 tablet 1  . Glucosamine-Chondroit-Vit C-Mn (GLUCOSAMINE 1500 COMPLEX PO) Glucosamine    .  Misc Natural Products (TART CHERRY ADVANCED) CAPS Take by mouth. 2500mg  daily    . Multiple Vitamin (MULTIVITAMIN WITH MINERALS) TABS tablet Take 1 tablet by mouth daily.    . Multiple Vitamins-Minerals (PRESERVISION AREDS 2 PO) Take 1 tablet by mouth in the morning and at bedtime.     . rosuvastatin (CRESTOR) 5 MG tablet TAKE 1 TABLET BY MOUTH AT BEDTIME 90 tablet 0  . Turmeric 500 MG CAPS Take by mouth daily.     No facility-administered medications prior to visit.    Allergies  Allergen Reactions  . Flagyl [Metronidazole] Rash    Possible cause of rash     ROS Review of Systems Review of Systems:  A fourteen system review of systems was performed and found to be positive as per HPI.  Objective:    Physical Exam General:  Well Developed, well nourished, appropriate for stated age.  Neuro:  Alert and oriented,  extra-ocular muscles intact, no focal deficits HEENT:  Normocephalic, atraumatic, neck supple, no carotid bruits appreciated  Skin:  no gross rash, warm, pink. Cardiac:  RRR, S1 S2 Respiratory:  ECTA B/L and A/P, Not using accessory muscles, speaking in full sentences- unlabored. Vascular:  Ext warm, no cyanosis apprec.; cap RF less 2 sec. Psych:  No HI/SI, judgement and insight good, Euthymic mood. Full Affect.   BP 127/73   Pulse 84   Ht 5\' 6"  (1.676 m)   Wt 168 lb 6.4 oz (76.4 kg)   SpO2 96%   BMI 27.18 kg/m  Wt Readings from Last 3 Encounters:  09/11/19 168 lb 6.4 oz (76.4 kg)  07/31/19 169 lb (76.7 kg)  07/10/19 167 lb (75.8 kg)     Health Maintenance Due  Topic Date Due  . COVID-19 Vaccine (1) Never done  . DEXA SCAN  Never done    There are no preventive care reminders to display for this patient.  Lab Results  Component Value Date   TSH 3.760 12/09/2018   Lab Results  Component Value Date   WBC 6.0 12/09/2018   HGB 13.9 12/09/2018   HCT 41.0 12/09/2018   MCV 90 12/09/2018   PLT 230 12/09/2018   Lab Results  Component Value Date   NA  139 12/09/2018   K 4.5 12/09/2018   CO2 22 12/09/2018   GLUCOSE 105 (H) 12/09/2018   BUN 12 12/09/2018   CREATININE 0.80 12/09/2018   BILITOT 0.4 12/09/2018   ALKPHOS 104 12/09/2018   AST 22 12/09/2018   ALT 5 05/07/2019   PROT 7.1 12/09/2018   ALBUMIN 4.7 (H) 12/09/2018   CALCIUM  10.8 (H) 12/09/2018   ANIONGAP 7 08/15/2016   Lab Results  Component Value Date   CHOL 182 05/07/2019   Lab Results  Component Value Date   HDL 77 05/07/2019   Lab Results  Component Value Date   LDLCALC 85 05/07/2019   Lab Results  Component Value Date   TRIG 112 05/07/2019   Lab Results  Component Value Date   CHOLHDL 2.4 05/07/2019   Lab Results  Component Value Date   HGBA1C 5.9 (H) 12/09/2018      Assessment & Plan:   Problem List Items Addressed This Visit      Cardiovascular and Mediastinum   Essential hypertension - Primary (Chronic)    - BP today is 127/73 HR 84, at goal. - Encourage ambulatory BP and pulse monitoring, especially when not feeling well. - Continue DASH diet. - Continue to stay well hydrated, at least 64 fl oz - Encourage to stay as active as possible.         Other   Other fatigue    -Discussed with obtaining blood work and prefers to wait until next office visit.  Recommended to notify the clinic if symptoms worsen and will obtain blood work sooner.  Patient verbalized understanding -Continue proper hydration, good rest, and Carnation drinks.      Mixed hyperlipidemia    - Last lipid panel wnl's. - Continue Crestor 5 mg. - Continue heart healthy diet and stay as active as possible.         Other Visit Diagnoses    Parkinsonism, unspecified Parkinsonism type ([place])       Decreased appetite         Parkinsonism: -Followed by neurology , Dr. Carles Collet. -On Sinemet IR -Reviewed most recent OV, patient started on Lexapro 10 mg for anxiety/depression and possibly pseudodementia.  Decreased appetite: -Per chart review, weight has remained  stable. -Encourage to eat smaller meals with intermittent snacks, and continue Carnation drinks. -We will continue to monitor.  No orders of the defined types were placed in this encounter.   Follow-up: Return for HTN, HLD and FBW few days prior OV in 3-4 months.    Lorrene Reid, PA-C

## 2019-09-11 NOTE — Assessment & Plan Note (Signed)
-  Discussed with obtaining blood work and prefers to wait until next office visit.  Recommended to notify the clinic if symptoms worsen and will obtain blood work sooner.  Patient verbalized understanding -Continue proper hydration, good rest, and Carnation drinks.

## 2019-09-11 NOTE — Assessment & Plan Note (Addendum)
-   BP today is 127/73 HR 84, at goal. - Encourage ambulatory BP and pulse monitoring, especially when not feeling well. - Continue DASH diet. - Continue to stay well hydrated, at least 64 fl oz - Encourage to stay as active as possible.

## 2019-09-22 ENCOUNTER — Ambulatory Visit: Payer: Medicare PPO | Admitting: Family Medicine

## 2019-09-22 ENCOUNTER — Encounter: Payer: Self-pay | Admitting: Family Medicine

## 2019-09-22 ENCOUNTER — Other Ambulatory Visit: Payer: Self-pay

## 2019-09-22 DIAGNOSIS — M1711 Unilateral primary osteoarthritis, right knee: Secondary | ICD-10-CM | POA: Diagnosis not present

## 2019-09-22 NOTE — Assessment & Plan Note (Signed)
Repeat injection given today.  Patient is doing relatively well with conservative therapy and these injections.  He is having no on chronic flare at the moment.  Discussed icing regimen, bracing, and repeating injections every 8 to 10 weeks otherwise.  Patient's other comorbidities including the Parkinson's will not make significant difference at this time and medications

## 2019-09-22 NOTE — Progress Notes (Signed)
[facility] [place] Phone: [phone] Subjective:   [name], am serving as a scribe for Dr. Hulan Saas. This visit occurred during the SARS-CoV-2 public health emergency.  Safety protocols were in place, including screening questions prior to the visit, additional usage of staff PPE, and extensive cleaning of exam room while observing appropriate contact time as indicated for disinfecting solutions.   I'm seeing this patient by the request  of:  Lorrene Reid, PA-C  CC: Knee pain follow-up  [id number]   07/31/2019 Injection again, chronic with exacerbatin, discussed HEP, discussed bracing, consider visco again or PRP, otherwise repeat injection every 8-10 weeks   Update 09/22/2019 GRANT HENKES is a 85 y.o. female coming in with complaint of right knee pain. Patient states knee pain is sharp when ascending stairs. Injections do help but last injection has worn off.       Past Medical History:  Diagnosis Date  . Hypertension    only one time  . Right knee pain   . Spinal stenosis of lumbar region    Past Surgical History:  Procedure Laterality Date  . HYSTERECTOMY ABDOMINAL WITH SALPINGECTOMY    . TONSILLECTOMY     Social History   Socioeconomic History  . Marital status: Widowed    Spouse name: Not on file  . Number of children: 3  . Years of education: 66  . Highest education level: High school graduate  Occupational History  . Not on file  Tobacco Use  . Smoking status: Never Smoker  . Smokeless tobacco: Never Used  Vaping Use  . Vaping Use: Never used  Substance and Sexual Activity  . Alcohol use: No    Alcohol/week: 0.0 standard drinks  . Drug use: No  . Sexual activity: Not Currently  Other Topics Concern  . Not on file  Social History Narrative   Pt lives alone in her 1 story home- she has 3 sons, and a high school graduate. She is right handed, she drinks coffee, and one  soda a week mostly water   Social Determinants of Health   Financial Resource Strain:   . Difficulty of Paying Living Expenses:   Food Insecurity:   . Worried About Charity fundraiser in the Last Year:   . Arboriculturist in the Last Year:   Transportation Needs:   . Film/video editor (Medical):   Marland Kitchen Lack of Transportation (Non-Medical):   Physical Activity:   . Days of Exercise per Week:   . Minutes of Exercise per Session:   Stress:   . Feeling of Stress :   Social Connections:   . Frequency of Communication with Friends and Family:   . Frequency of Social Gatherings with Friends and Family:   . Attends Religious Services:   . Active Member of Clubs or Organizations:   . Attends Archivist Meetings:   Marland Kitchen Marital Status:    Allergies  Allergen Reactions  . Flagyl [Metronidazole] Rash    Possible cause of rash    Family History  Problem Relation Age of Onset  . Breast cancer Mother   . Melanoma Father   . Healthy Son      Current Outpatient Medications (Cardiovascular):  .  rosuvastatin (CRESTOR) 5 MG tablet, TAKE 1 TABLET BY MOUTH AT BEDTIME   Current Outpatient Medications (Analgesics):  .  aspirin 81 MG tablet, Take 325 mg by mouth daily.   Current  Outpatient Medications (Hematological):  Marland Kitchen  Cyanocobalamin (VITAMIN B 12 PO), Take by mouth. 5, 000 mcg daily  Current Outpatient Medications (Other):  .  Biotin 10000 MCG TABS, Take by mouth. 1/2 tablet daily .  carbidopa-levodopa (SINEMET IR) 25-100 MG tablet, TAKE 1 AND 1/2 TABLETS BY MOUTH 3 TIMES DAILY. .  ciclopirox (LOPROX) 0.77 % cream, Apply twice a day to rash on the skin of the feet .  Coenzyme Q10 (COQ-10) 100 MG CAPS, Take 1 tablet by mouth daily. Marland Kitchen  escitalopram (LEXAPRO) 10 MG tablet, Take 1 tablet (10 mg total) by mouth daily. .  Glucosamine-Chondroit-Vit C-Mn (GLUCOSAMINE 1500 COMPLEX PO), Glucosamine .  Misc Natural Products (TART CHERRY ADVANCED) CAPS, Take by mouth. 2500mg  daily .   Multiple Vitamin (MULTIVITAMIN WITH MINERALS) TABS tablet, Take 1 tablet by mouth daily. .  Multiple Vitamins-Minerals (PRESERVISION AREDS 2 PO), Take 1 tablet by mouth in the morning and at bedtime.  .  Turmeric 500 MG CAPS, Take by mouth daily.   Reviewed prior external information including notes and imaging from  primary care provider As well as notes that were available from care everywhere and other healthcare systems.  Past medical history, social, surgical and family history all reviewed in electronic medical record.  No pertanent information unless stated regarding to the chief complaint.   Review of Systems:  No headache, visual changes, nausea, vomiting, diarrhea, constipation, dizziness, abdominal pain, skin rash, fevers, chills, night sweats, weight loss, swollen lymph nodes,  joint swelling, chest pain, shortness of breath, mood changes. POSITIVE muscle aches, body aches  Objective  Blood pressure 122/80, pulse 78, height 5\' 6"  (1.676 m), weight 168 lb (76.2 kg), SpO2 98 %.   General: No apparent distress alert and oriented x3 mood and affect normal, dressed appropriately.  Masked facies noted HEENT: Pupils equal, extraocular movements intact  Respiratory: Patient's speak in full sentences and does not appear short of breath  Gait antalgic short shuffling gait Knee: valgus deformity noted. Large thigh to calf ratio.  Tender to palpation over medial and PF joint line.  ROM decreased with some mild cogwheeling which is new instability with valgus force.  painful patellar compression. Patellar glide with moderate crepitus. Patellar and quadriceps tendons unremarkable. Hamstring and quadriceps strength is normal.  After informed written and verbal consent, patient was seated on exam table. Right knee was prepped with alcohol swab and utilizing anterolateral approach, patient's right knee space was injected with 4:1  marcaine 0.5%: Kenalog 40mg /dL. Patient tolerated the  procedure well without immediate complications.   Impression and Recommendations:     The above documentation has been reviewed and is accurate and complete Lyndal Pulley, DO       Note: This dictation was prepared with Dragon dictation along with smaller phrase technology. Any transcriptional errors that result from this process are unintentional.

## 2019-09-22 NOTE — Patient Instructions (Signed)
Injected knee today See me in 10 weeks

## 2019-10-01 ENCOUNTER — Encounter: Payer: Self-pay | Admitting: Physician Assistant

## 2019-10-01 ENCOUNTER — Ambulatory Visit: Payer: Medicare PPO | Admitting: Family Medicine

## 2019-10-01 ENCOUNTER — Other Ambulatory Visit: Payer: Self-pay

## 2019-10-01 ENCOUNTER — Ambulatory Visit (INDEPENDENT_AMBULATORY_CARE_PROVIDER_SITE_OTHER): Payer: Medicare PPO | Admitting: Physician Assistant

## 2019-10-01 VITALS — Temp 98.3°F | Ht 66.0 in | Wt 168.7 lb

## 2019-10-01 DIAGNOSIS — R5383 Other fatigue: Secondary | ICD-10-CM | POA: Diagnosis not present

## 2019-10-01 DIAGNOSIS — H6593 Unspecified nonsuppurative otitis media, bilateral: Secondary | ICD-10-CM

## 2019-10-01 DIAGNOSIS — R42 Dizziness and giddiness: Secondary | ICD-10-CM | POA: Diagnosis not present

## 2019-10-01 NOTE — Progress Notes (Signed)
Established Patient Office Visit  Subjective:  Patient ID: Jamie [name], female    DOB: [birth year]  Age: 85 y.o. MRN: [id number]  CC:  Chief Complaint  Patient presents with   Dizziness    HPI Jamie [name] presents for complaint of dizziness. Reports she has been feeling lightheaded for the past couple of weeks which mostly happens when going from position to another. She also reports no energy, intermittent mild headache which usually resolves, and decreased appetite. Denies nausea, vomiting, chest pain, or palpitations. Pt is followed by Neurology for Parkinson's disease and states she feels like her memory is worse. She is more forgetful.   Past Medical History:  Diagnosis Date   Hypertension    only one time   Right knee pain    Spinal stenosis of lumbar region     Past Surgical History:  Procedure Laterality Date   HYSTERECTOMY ABDOMINAL WITH SALPINGECTOMY     TONSILLECTOMY      Family History  Problem Relation Age of Onset   Breast cancer Mother    Melanoma Father    Healthy Son     Social History   Socioeconomic History   Marital status: Widowed    Spouse name: Not on file   Number of children: 3   Years of education: 12   Highest education level: High school graduate  Occupational History   Not on file  Tobacco Use   Smoking status: Never Smoker   Smokeless tobacco: Never Used  Vaping Use   Vaping Use: Never used  Substance and Sexual Activity   Alcohol use: No    Alcohol/week: 0.0 standard drinks   Drug use: No   Sexual activity: Not Currently  Other Topics Concern   Not on file  Social History Narrative   Pt lives alone in her 1 story home- she has 3 sons, and a high school graduate. She is right handed, she drinks coffee, and one soda a week mostly water   Social Determinants of Health   Financial Resource Strain:    Difficulty of Paying Living Expenses:   Food Insecurity:    Worried About Charity fundraiser  in the Last Year:    Arboriculturist in the Last Year:   Transportation Needs:    Film/video editor (Medical):    Lack of Transportation (Non-Medical):   Physical Activity:    Days of Exercise per Week:    Minutes of Exercise per Session:   Stress:    Feeling of Stress :   Social Connections:    Frequency of Communication with Friends and Family:    Frequency of Social Gatherings with Friends and Family:    Attends Religious Services:    Active Member of Clubs or Organizations:    Attends Music therapist:    Marital Status:   Intimate Partner Violence:    Fear of Current or Ex-Partner:    Emotionally Abused:    Physically Abused:    Sexually Abused:     Outpatient Medications Prior to Visit  Medication Sig Dispense Refill   aspirin 81 MG tablet Take 325 mg by mouth daily.      Biotin 10000 MCG TABS Take by mouth. 1/2 tablet daily     carbidopa-levodopa (SINEMET IR) 25-100 MG tablet TAKE 1 AND 1/2 TABLETS BY MOUTH 3 TIMES DAILY. 405 tablet 1   Coenzyme Q10 (COQ-10) 100 MG CAPS Take 1 tablet by mouth daily.  Cyanocobalamin (VITAMIN B 12 PO) Take by mouth. 5, 000 mcg daily     escitalopram (LEXAPRO) 10 MG tablet Take 1 tablet (10 mg total) by mouth daily. 90 tablet 1   Glucosamine-Chondroit-Vit C-Mn (GLUCOSAMINE 1500 COMPLEX PO) Glucosamine     Misc Natural Products (TART CHERRY ADVANCED) CAPS Take by mouth. '2500mg'$  daily     Multiple Vitamin (MULTIVITAMIN WITH MINERALS) TABS tablet Take 1 tablet by mouth daily.     Multiple Vitamins-Minerals (PRESERVISION AREDS 2 PO) Take 1 tablet by mouth in the morning and at bedtime.      rosuvastatin (CRESTOR) 5 MG tablet TAKE 1 TABLET BY MOUTH AT BEDTIME 90 tablet 0   Turmeric 500 MG CAPS Take by mouth daily.     ciclopirox (LOPROX) 0.77 % cream Apply twice a day to rash on the skin of the feet 90 g 0   No facility-administered medications prior to visit.    Allergies  Allergen Reactions    Flagyl [Metronidazole] Rash    Possible cause of rash     ROS Review of Systems  A fourteen system review of systems was performed and found to be positive as per HPI.  Objective:    Physical Exam General:  Well Developed, well nourished, appropriate for stated age.  Neuro:  Alert and oriented,  extra-ocular muscles intact, PERRLA, mild nystagmus present with Dix-Hallpike maneuver   HEENT:  Normocephalic, atraumatic, middle ear effusion of both ears, neck supple Skin:  no gross rash, warm, pink. Cardiac:  RRR, S1 S2 Respiratory:  ECTA B/L and A/P, Not using accessory muscles, speaking in full sentences- unlabored. Vascular:  Ext warm, no cyanosis apprec.; cap RF less 2 sec. Psych:  No HI/SI, judgement and insight good, Euthymic mood. Full Affect.  Temp 98.3 F (36.8 C) (Oral)    Ht '5\' 6"'$  (1.676 m)    Wt 168 lb 11.2 oz (76.5 kg)    SpO2 97% Comment: on RA   BMI 27.23 kg/m  Wt Readings from Last 3 Encounters:  10/01/19 168 lb 11.2 oz (76.5 kg)  09/22/19 168 lb (76.2 kg)  09/11/19 168 lb 6.4 oz (76.4 kg)     Health Maintenance Due  Topic Date Due   COVID-19 Vaccine (1) Never done   DEXA SCAN  Never done   INFLUENZA VACCINE  09/21/2019    There are no preventive care reminders to display for this patient.  Lab Results  Component Value Date   TSH 3.760 12/09/2018   Lab Results  Component Value Date   WBC 6.0 12/09/2018   HGB 13.9 12/09/2018   HCT 41.0 12/09/2018   MCV 90 12/09/2018   PLT 230 12/09/2018   Lab Results  Component Value Date   NA 139 12/09/2018   K 4.5 12/09/2018   CO2 22 12/09/2018   GLUCOSE 105 (H) 12/09/2018   BUN 12 12/09/2018   CREATININE 0.80 12/09/2018   BILITOT 0.4 12/09/2018   ALKPHOS 104 12/09/2018   AST 22 12/09/2018   ALT 5 05/07/2019   PROT 7.1 12/09/2018   ALBUMIN 4.7 (H) 12/09/2018   CALCIUM 10.8 (H) 12/09/2018   ANIONGAP 7 08/15/2016   Lab Results  Component Value Date   CHOL 182 05/07/2019   Lab Results   Component Value Date   HDL 77 05/07/2019   Lab Results  Component Value Date   LDLCALC 85 05/07/2019   Lab Results  Component Value Date   TRIG 112 05/07/2019   Lab Results  Component Value  Date   CHOLHDL 2.4 05/07/2019   Lab Results  Component Value Date   HGBA1C 5.9 (H) 12/09/2018      Assessment & Plan:   Problem List Items Addressed This Visit      Other   Other fatigue   Relevant Orders   HgB A1c   TSH   Comp Met (CMET)   CBC w/Diff   Direct LDL   B12    Other Visit Diagnoses    Vertigo    -  Primary   Dizziness       Relevant Orders   HgB A1c   TSH   Comp Met (CMET)   CBC w/Diff   Direct LDL   B12   Middle ear effusion, bilateral         Vertigo, dizziness, middle ear effusion: -Pt became dizzy during Dix-Hallpike maneuver and mild nystagmus was present so most likely BPPV causing her symptoms. Orthostatics were wnl. -Bilateral middle ear effusion likely contributing to vertigo and recommended to start antihistamine medication such as Claritin and Flonase.  -Provided home exercises for vertigo. Pt declined medication at this time. -If symptoms fail to improve or worsen recommend vestibular rehabilitation. Pt verbalized understanding. -Pt is followed by neurology for Parkinson's and has an upcoming appointment in November, and advised to discuss vertigo if symptoms still persistent. Also recommend to discuss worsening memory concerns.  Other fatigue: -Fatigue is most likely related to Parkinson's disease. But will place lab orders to evaluate for other possible etiologies such as anemia, thyroid disease, nutritional deficiency, diabetes mellitus, or renal disease.  No orders of the defined types were placed in this encounter.   Follow-up: Return for as scheduled.    Lorrene Reid, PA-C

## 2019-10-01 NOTE — Patient Instructions (Addendum)
How to Treat Vertigo at Home with Exercises  What is Vertigo?  Vertigo is a relatively common symptom most often associated with conditions such as sinusitis (inflammation of your sinuses due to viruses, allergies, or bacterial infections), or an inner ear infection or ear trauma.   It can be brought on by trauma (e.g. a blow to the head or whiplash) or more serious things like minor strokes.   Symptoms can also be brought on by normal degenerative changes to your inner ear that occur with aging.  The condition tends to be more commonly seen in the elderly but it can occur in all ages.    Patients most often complain of dizziness, as if the room is spinning around them.   Symptoms are provoked by quick head movements or changes in position like going from standing to lying in bed, or even turning over in bed.   It may present with nausea and/or vomiting, and can be very debilitating to some folks.    By far the most common cause, known as Benign Paroxysmal Positional Vertigo (BPPV), is categorized by a sudden onset of symptoms, that are intense but short-lived (60 seconds or less), which is triggered by a change in head position.   Symptoms usually dissipate if you stay in one position and do not move your head.   Within the inner ear are collections of calcium carbonate crystals referred to as otoliths which may become dislodged from their normal position and migrate into the semicircular canals of the inner ear, throwing off your body's ability to sense where you are in space.     Fig. 921 Anatomy of the Right Osseous Labyrinth. Antonieta Iba. Anatomy of the Human Body. 1918.            What Else Could Be Behind My Vertigo?  Some other causes of vertigo include:  Menieres disease (disorder of inner ear with ringing in ears, feeling of fullness/pressure within ear, and fluctuating hearing loss) Tumours Neurological disorders e.g. Multiple Sclerosis Motion Sickness (lack of coordination  between visual stimuli, inner ear balance and positional sense) Migraine Labyrinthitis (inflammation of the fluid-filled tubes and sacs within the inner ear; may also be associated with changes in hearing) Vestibular neuritis (inflammation of the nerves associated with transmission of sensory info from the inner ear; usually of viral origins)  How it can be treated/cured? While certain medications have been prescribed for vertigo including Lorazepam your doing well 7 house the house going organizing and getting things ready for sale with the and Meclizine (for motion sickness), there exists no evidence to support a recommendation of any medication in the routine treatment of BPPV.  Clinical trials have demonstrated that repositioning techniques (listed below) are a superior option for management Otis Dials et al., 2008).    Figure above:  (A) Instructions for the modified Epley procedure (MEP) for left ear posterior canal benign paroxysmal positional vertigo (PC-BPPV). For right ear BPPV, the procedure has to be performed in the opposite direction, starting with the head turned to the right side.  1. Start by sitting on a bed with your head turned 45 to the left. Place a pillow behind you so that on lying back it will be under your shoulders.  2. Lie back quickly with shoulders on the pillow, neck extended, and head resting on the bed. In this position, the affected (left) ear is underneath. Wait for 30 secondS.  3. Turn your head 90 to the right (without raising it), and  wait again for 30 seconds.  4. Turn your body and head another 90 to the right, and wait for another 30 seconds.  5. Sit up on the right side. This maneuver should be performed three times a day. Repeat this daily until you are free from positional vertigo for 24 hours.   (B) Instructions for the modified Semont maneuver (MSM) for left ear PC-BPPV. For right ear BPPV, the maneuver has to be performed in the opposite direction,  starting with the head turned toward the left ear.  1. Sit upright on a bed with your head turned 45 toward the right ear.  2. Drop quickly to the left side, so that your head touches the bed behind your left ear. Wait 30 seconds.  3. Move head and trunk in a swift movement toward the other side without stopping in the upright position, so that your head comes to rest on the right side of your forehead. Wait again for 30 seconds.  4. Sit up again.  This maneuver should be performed three times a day. Repeat this daily until you are free from positional vertigo symptoms for 24 hours.   (   See the video in the supplementary material on the NeurologyWeb site; go to http://www.neurology.org/content/63/1/150/F1.expansion.html   )     You can also try this motion at home as well- Self-Treatment of Benign Paroxysmal Positional Vertigo Benign Paroxysmal Positioning Vertigo is caused by loose inner ear crystals in the inner ear that migrate while sleeping to the back-bottom inner ear balance canal, the so-called posterior semi-circular canal. The maneuver demonstrated below is the way to reposition the loose crystals so that the symptoms caused by the loose crystals go away. You may have a floating, swaying sense while walking or sitting for a few days after this procedure.                Benign Positional Vertigo Vertigo is the feeling that you or your surroundings are moving when they are not. Benign positional vertigo is the most common form of vertigo. This is usually a harmless condition (benign). This condition is positional. This means that symptoms are triggered by certain movements and positions. This condition can be dangerous if it occurs while you are doing something that could cause harm to you or others. This includes activities such as driving or operating machinery. What are the causes? In many cases, the cause of this condition is not known. It may be caused by a  disturbance in an area of the inner ear that helps your brain to sense movement and balance. This disturbance can be caused by:  Viral infection (labyrinthitis).  Head injury.  Repetitive motion, such as jumping, dancing, or running. What increases the risk? You are more likely to develop this condition if:  You are a woman.  You are 63 years of age or older. What are the signs or symptoms? Symptoms of this condition usually happen when you move your head or your eyes in different directions. Symptoms may start suddenly, and usually last for less than a minute. They include:  Loss of balance and falling.  Feeling like you are spinning or moving.  Feeling like your surroundings are spinning or moving.  Nausea and vomiting.  Blurred vision.  Dizziness.  Involuntary eye movement (nystagmus). Symptoms can be mild and cause only minor problems, or they can be severe and interfere with daily life. Episodes of benign positional vertigo may return (recur) over time. Symptoms may improve  over time. How is this diagnosed? This condition may be diagnosed based on:  Your medical history.  Physical exam of the head, neck, and ears.  Tests, such as: ? MRI. ? CT scan. ? Eye movement tests. Your health care provider may ask you to change positions quickly while he or she watches you for symptoms of benign positional vertigo, such as nystagmus. Eye movement may be tested with a variety of exams that are designed to evaluate or stimulate vertigo. ? An electroencephalogram (EEG). This records electrical activity in your brain. ? Hearing tests. You may be referred to a health care provider who specializes in ear, nose, and throat (ENT) problems (otolaryngologist) or a provider who specializes in disorders of the nervous system (neurologist). How is this treated?  This condition may be treated in a session in which your health care provider moves your head in specific positions to adjust  your inner ear back to normal. Treatment for this condition may take several sessions. Surgery may be needed in severe cases, but this is rare. In some cases, benign positional vertigo may resolve on its own in 2-4 weeks. Follow these instructions at home: Safety  Move slowly. Avoid sudden body or head movements or certain positions, as told by your health care provider.  Avoid driving until your health care provider says it is safe for you to do so.  Avoid operating heavy machinery until your health care provider says it is safe for you to do so.  Avoid doing any tasks that would be dangerous to you or others if vertigo occurs.  If you have trouble walking or keeping your balance, try using a cane for stability. If you feel dizzy or unstable, sit down right away.  Return to your normal activities as told by your health care provider. Ask your health care provider what activities are safe for you. General instructions  Take over-the-counter and prescription medicines only as told by your health care provider.  Drink enough fluid to keep your urine pale yellow.  Keep all follow-up visits as told by your health care provider. This is important. Contact a health care provider if:  You have a fever.  Your condition gets worse or you develop new symptoms.  Your family or friends notice any behavioral changes.  You have nausea or vomiting that gets worse.  You have numbness or a "pins and needles" sensation. Get help right away if you:  Have difficulty speaking or moving.  Are always dizzy.  Faint.  Develop severe headaches.  Have weakness in your legs or arms.  Have changes in your hearing or vision.  Develop a stiff neck.  Develop sensitivity to light. Summary  Vertigo is the feeling that you or your surroundings are moving when they are not. Benign positional vertigo is the most common form of vertigo.  The cause of this condition is not known. It may be caused by  a disturbance in an area of the inner ear that helps your brain to sense movement and balance.  Symptoms include loss of balance and falling, feeling that you or your surroundings are moving, nausea and vomiting, and blurred vision.  This condition can be diagnosed based on symptoms, physical exam, and other tests, such as MRI, CT scan, eye movement tests, and hearing tests.  Follow safety instructions as told by your health care provider. You will also be told when to contact your health care provider in case of problems. This information is not intended to  replace advice given to you by your health care provider. Make sure you discuss any questions you have with your health care provider. Document Revised: 07/18/2017 Document Reviewed: 07/18/2017 Elsevier Patient Education  [place].

## 2019-10-02 LAB — COMPREHENSIVE METABOLIC PANEL
ALT: 10 IU/L (ref 0–32)
AST: 17 IU/L (ref 0–40)
Albumin/Globulin Ratio: 2.2 (ref 1.2–2.2)
Albumin: 4.7 g/dL — ABNORMAL HIGH (ref 3.6–4.6)
Alkaline Phosphatase: 94 IU/L (ref 48–121)
BUN/Creatinine Ratio: 16 (ref 12–28)
BUN: 16 mg/dL (ref 8–27)
Bilirubin Total: 0.4 mg/dL (ref 0.0–1.2)
CO2: 26 mmol/L (ref 20–29)
Calcium: 10.6 mg/dL — ABNORMAL HIGH (ref 8.7–10.3)
Chloride: 101 mmol/L (ref 96–106)
Creatinine, Ser: 1 mg/dL (ref 0.57–1.00)
GFR calc Af Amer: 60 mL/min/{1.73_m2} (ref >59)
GFR calc non Af Amer: 52 mL/min/{1.73_m2} — ABNORMAL LOW (ref >59)
Globulin, Total: 2.1 g/dL (ref 1.5–4.5)
Glucose: 134 mg/dL — ABNORMAL HIGH (ref 65–99)
Potassium: 4.4 mmol/L (ref 3.5–5.2)
Sodium: 140 mmol/L (ref 134–144)
Total Protein: 6.8 g/dL (ref 6.0–8.5)

## 2019-10-02 LAB — CBC WITH DIFFERENTIAL/PLATELET
Basophils Absolute: 0.1 10*3/uL (ref 0.0–0.2)
Basos: 1 %
EOS (ABSOLUTE): 0.1 10*3/uL (ref 0.0–0.4)
Eos: 2 %
Hematocrit: 41.3 % (ref 34.0–46.6)
Hemoglobin: 13.4 g/dL (ref 11.1–15.9)
Immature Grans (Abs): 0 10*3/uL (ref 0.0–0.1)
Immature Granulocytes: 0 %
Lymphocytes Absolute: 2.1 10*3/uL (ref 0.7–3.1)
Lymphs: 26 %
MCH: 30.5 pg (ref 26.6–33.0)
MCHC: 32.4 g/dL (ref 31.5–35.7)
MCV: 94 fL (ref 79–97)
Monocytes Absolute: 0.8 10*3/uL (ref 0.1–0.9)
Monocytes: 10 %
Neutrophils Absolute: 5 10*3/uL (ref 1.4–7.0)
Neutrophils: 61 %
Platelets: 217 10*3/uL (ref 150–450)
RBC: 4.39 x10E6/uL (ref 3.77–5.28)
RDW: 12.8 % (ref 11.7–15.4)
WBC: 8.1 10*3/uL (ref 3.4–10.8)

## 2019-10-02 LAB — HEMOGLOBIN A1C
Est. average glucose Bld gHb Est-mCnc: 134 mg/dL
Hgb A1c MFr Bld: 6.3 % — ABNORMAL HIGH (ref 4.8–5.6)

## 2019-10-02 LAB — VITAMIN B12: Vitamin B-12: 1441 pg/mL — ABNORMAL HIGH (ref 232–1245)

## 2019-10-02 LAB — TSH: TSH: 2.99 u[IU]/mL (ref 0.450–4.500)

## 2019-10-02 LAB — LDL CHOLESTEROL, DIRECT: LDL Direct: 95 mg/dL (ref 0–99)

## 2019-11-29 ENCOUNTER — Other Ambulatory Visit: Payer: Self-pay | Admitting: Physician Assistant

## 2019-12-01 ENCOUNTER — Ambulatory Visit: Payer: Medicare PPO | Admitting: Family Medicine

## 2019-12-01 ENCOUNTER — Encounter: Payer: Self-pay | Admitting: Family Medicine

## 2019-12-01 ENCOUNTER — Other Ambulatory Visit: Payer: Self-pay

## 2019-12-01 DIAGNOSIS — M1711 Unilateral primary osteoarthritis, right knee: Secondary | ICD-10-CM | POA: Diagnosis not present

## 2019-12-01 NOTE — Patient Instructions (Signed)
Happy [place] Day See me in 10 weeks

## 2019-12-01 NOTE — Assessment & Plan Note (Signed)
Bilateral injections given today, tolerated procedure well, patient has this chronic problem with exacerbation.  Due to other comorbidities and patient's age this seems to be the safest thing for her.  Can inject every 10 to 12 weeks.  Patient will follow up again in 10 to 12 weeks

## 2019-12-01 NOTE — Progress Notes (Signed)
[facility] [place] Phone: [phone] Subjective:   Jamie [name], am serving as a scribe for Dr. Hulan Saas. This visit occurred during the SARS-CoV-2 public health emergency.  Safety protocols were in place, including screening questions prior to the visit, additional usage of staff PPE, and extensive cleaning of exam room while observing appropriate contact time as indicated for disinfecting solutions.   I'm seeing this patient by the request  of:  Jamie Reid, PA-C  CC: Right-sided knee pain  BFX:[id number]   09/22/2019 Repeat injection given today.  Patient is doing relatively well with conservative therapy and these injections.  He is having [name] on chronic flare at the moment.  Discussed icing regimen, bracing, and repeating injections every 8 to 10 weeks otherwise.  Patient's other comorbidities including the Parkinson's will not make significant difference at this time and medications  Update 12/01/2019 Jamie [name] is a 85 y.o. female coming in with complaint of right, knee pain. Patient states that the injection seems to be wearing.  Patient states that just some mild pain recently.  [name] increasing instability but still has instability fairly regularly.  Patient denies any new symptoms just worsening of previous symptoms      Past Medical History:  Diagnosis Date   Hypertension    only one time   Right knee pain    Spinal stenosis of lumbar region    Past Surgical History:  Procedure Laterality Date   HYSTERECTOMY ABDOMINAL WITH SALPINGECTOMY     TONSILLECTOMY     Social History   Socioeconomic History   Marital status: Widowed    Spouse name: Not on file   Number of children: 3   Years of education: 12   Highest education level: High school graduate  Occupational History   Not on file  Tobacco Use   Smoking status: Never Smoker   Smokeless tobacco: Never Used  Vaping Use    Vaping Use: Never used  Substance and Sexual Activity   Alcohol use: [name]    Alcohol/week: 0.0 standard drinks   Drug use: [name]   Sexual activity: Not Currently  Other Topics Concern   Not on file  Social History Narrative   Pt lives alone in her 1 story home- she has 3 sons, and a high school graduate. She is right handed, she drinks coffee, and one soda a week mostly water   Social Determinants of Health   Financial Resource Strain:    Difficulty of Paying Living Expenses: Not on file  Food Insecurity:    Worried About Charity fundraiser in the Last Year: Not on file   [employer] of Food in the Last Year: Not on file  Transportation Needs:    Lack of Transportation (Medical): Not on file   Lack of Transportation (Non-Medical): Not on file  Physical Activity:    Days of Exercise per Week: Not on file   Minutes of Exercise per Session: Not on file  Stress:    Feeling of Stress : Not on file  Social Connections:    Frequency of Communication with Friends and Family: Not on file   Frequency of Social Gatherings with Friends and Family: Not on file   Attends Religious Services: Not on file   Active Member of Clubs or Organizations: Not on file   Attends Archivist Meetings: Not on file   Marital Status: Not on file   Allergies  Allergen  Reactions   Flagyl [Metronidazole] Rash    Possible cause of rash    Family History  Problem Relation Age of Onset   Breast cancer Mother    Melanoma Father    Healthy Son      Current Outpatient Medications (Cardiovascular):    rosuvastatin (CRESTOR) 5 MG tablet, TAKE 1 TABLET BY MOUTH AT BEDTIME   Current Outpatient Medications (Analgesics):    aspirin 81 MG tablet, Take 325 mg by mouth daily.   Current Outpatient Medications (Hematological):    Cyanocobalamin (VITAMIN B 12 PO), Take by mouth. 5, 000 mcg daily  Current Outpatient Medications (Other):    Biotin 10000 MCG TABS, Take by mouth. 1/2  tablet daily   carbidopa-levodopa (SINEMET IR) 25-100 MG tablet, TAKE 1 AND 1/2 TABLETS BY MOUTH 3 TIMES DAILY.   Coenzyme Q10 (COQ-10) 100 MG CAPS, Take 1 tablet by mouth daily.   escitalopram (LEXAPRO) 10 MG tablet, Take 1 tablet (10 mg total) by mouth daily.   Glucosamine-Chondroit-Vit C-Mn (GLUCOSAMINE 1500 COMPLEX PO), Glucosamine   Misc Natural Products (TART CHERRY ADVANCED) CAPS, Take by mouth. 2500mg  daily   Multiple Vitamin (MULTIVITAMIN WITH MINERALS) TABS tablet, Take 1 tablet by mouth daily.   Multiple Vitamins-Minerals (PRESERVISION AREDS 2 PO), Take 1 tablet by mouth in the morning and at bedtime.    Turmeric 500 MG CAPS, Take by mouth daily.   Reviewed prior external information including notes and imaging from  primary care provider As well as notes that were available from care everywhere and other healthcare systems.  Past medical history, social, surgical and family history all reviewed in electronic medical record.  [name] pertanent information unless stated regarding to the chief complaint.   Review of Systems:  [name] headache, visual changes, nausea, vomiting, diarrhea, constipation, dizziness, abdominal pain, skin rash, fevers, chills, night sweats, weight loss, swollen lymph nodes,  chest pain, shortness of breath, mood changes. POSITIVE muscle aches, body aches, joint swelling  Objective  Blood pressure 122/82, pulse 85, height 5\' 6"  (1.676 m), weight 169 lb (76.7 kg), SpO2 96 %.   General: [name] apparent distress alert and oriented x3 mood and affect normal, dressed appropriately. Masked facies  HEENT: Pupils equal, extraocular movements intact  Respiratory: Patient's speak in full sentences and does not appear short of breath  Cardiovascular: [name] lower extremity edema, non tender, [name] erythema  Gait antalgic MSK: Knee: Right valgus deformity noted. Large thigh to calf ratio.  Tender to palpation over medial and PF joint line.  Mild cogwheeling noted with range  of motion instability with valgus force.  painful patellar compression. Patellar glide with moderate crepitus. Patellar and quadriceps tendons unremarkable. Hamstring and quadriceps strength is normal.  After informed written and verbal consent, patient was seated on exam table. Right knee was prepped with alcohol swab and utilizing anterolateral approach, patient's right knee space was injected with 4:1  marcaine 0.5%: Kenalog 40mg /dL. Patient tolerated the procedure well without immediate complications.    Impression and Recommendations:     The above documentation has been reviewed and is accurate and complete Lyndal Pulley, DO

## 2019-12-02 ENCOUNTER — Other Ambulatory Visit: Payer: Self-pay | Admitting: Physician Assistant

## 2019-12-02 DIAGNOSIS — Z8639 Personal history of other endocrine, nutritional and metabolic disease: Secondary | ICD-10-CM

## 2019-12-02 DIAGNOSIS — E782 Mixed hyperlipidemia: Secondary | ICD-10-CM

## 2019-12-02 DIAGNOSIS — R7303 Prediabetes: Secondary | ICD-10-CM

## 2019-12-02 DIAGNOSIS — I10 Essential (primary) hypertension: Secondary | ICD-10-CM

## 2019-12-02 DIAGNOSIS — E785 Hyperlipidemia, unspecified: Secondary | ICD-10-CM

## 2019-12-03 ENCOUNTER — Ambulatory Visit
Admission: EM | Admit: 2019-12-03 | Discharge: 2019-12-03 | Disposition: A | Discharge status: Home / Self Care | Payer: Medicare PPO | Attending: Emergency Medicine | Admitting: Emergency Medicine

## 2019-12-03 ENCOUNTER — Telehealth: Payer: Self-pay | Admitting: Physician Assistant

## 2019-12-03 DIAGNOSIS — R040 Epistaxis: Secondary | ICD-10-CM | POA: Diagnosis not present

## 2019-12-03 MED ORDER — AFRIN NASAL SPRAY 0.05 % NA SOLN: 1.0000 | Freq: Two times a day (BID) | NASAL | 0 refills | Status: DC

## 2019-12-03 MED ORDER — AYR SALINE NASAL NA GEL: 1.0000 "application " | Freq: Every day | NASAL | 0 refills | Status: DC

## 2019-12-03 NOTE — Telephone Encounter (Signed)
Patient son states patient has had off and on nose bleeds since 4am stating that "there was blood everywhere". I advised them to go to UC for evaluation to see if her nose needs to be cauterized. Patient daughter in law verbalized understanding is was agreeable. AS, CMA

## 2019-12-03 NOTE — ED Provider Notes (Signed)
[facility]    CSN: [id number] Arrival date & time: 12/03/19  2800      History   Chief Complaint Chief Complaint  Patient presents with  . Epistaxis    HPI Jamie [name] is a 85 y.o. female  Presenting for nosebleed.  States that it began yesterday morning.  Denies anticoagulant/blood thinner use, change in diet, lifestyle, medications.  No injury.  Denies recent head cold, cough, difficulty breathing.  No lightheadedness or dizziness.  Unsure if she picked her nose.  Not actively bleeding at this time: Controlled with direct pressure.  Past Medical History:  Diagnosis Date  . Hypertension    only one time  . Right knee pain   . Spinal stenosis of lumbar region     Patient Active Problem List   Diagnosis Date Noted  . Generalized OA-    especially knees-see sports med 07/29/2018  . Right knee pain 05/07/2018  . Degenerative joint disease of knee, right 03/19/2018  . Recurrent R knee pain- acute on chronic after twisting injury 03/02/18 03/12/2018  . Osteoarthritis of knee 09/18/2017  . Mixed hyperlipidemia 08/22/2017  . Prediabetes 08/22/2017  . White coat syndrome without diagnosis of hypertension 08/22/2017  . History of vitamin D deficiency 08/22/2017  . Lumbar facet arthropathy 06/27/2017  . History of hysterectomy for benign disease 06/27/2017  . Anxiousness 06/27/2017  . History of dizziness 06/27/2017  . Other fatigue 06/27/2017  . Overweight (BMI 25.0-29.9) 06/27/2017  . Weakness of both hips 04/18/2017  . Abnormality of gait 04/18/2017  . Spinal stenosis of lumbar region 04/18/2017  . Pancolitis ([place]) 08/14/2016  . Essential hypertension 08/14/2016  . Leukocytes in urine 08/14/2016    Past Surgical History:  Procedure Laterality Date  . HYSTERECTOMY ABDOMINAL WITH SALPINGECTOMY    . TONSILLECTOMY      OB History   No obstetric history on file.      Home Medications    Prior to Admission medications   Medication Sig Start  Date End Date Taking? Authorizing Provider  aspirin 81 MG tablet Take 325 mg by mouth daily.     [provider]  Biotin 10000 MCG TABS Take by mouth. 1/2 tablet daily    [provider]  carbidopa-levodopa (SINEMET IR) 25-100 MG tablet TAKE 1 AND 1/2 TABLETS BY MOUTH 3 TIMES DAILY. 09/01/19   Tat, Eustace Quail, DO  Coenzyme Q10 (COQ-10) 100 MG CAPS Take 1 tablet by mouth daily.    [provider]  Cyanocobalamin (VITAMIN B 12 PO) Take by mouth. 5, 000 mcg daily    [provider]  escitalopram (LEXAPRO) 10 MG tablet Take 1 tablet (10 mg total) by mouth daily. 07/10/19   Tat, Eustace Quail, DO  Glucosamine-Chondroit-Vit C-Mn (GLUCOSAMINE 1500 COMPLEX PO) Glucosamine    [provider]  Misc Natural Products (TART CHERRY ADVANCED) CAPS Take by mouth. 2500mg  daily    [provider]  Multiple Vitamin (MULTIVITAMIN WITH MINERALS) TABS tablet Take 1 tablet by mouth daily.    [provider]  Multiple Vitamins-Minerals (PRESERVISION AREDS 2 PO) Take 1 tablet by mouth in the morning and at bedtime.     [provider]  oxymetazoline (AFRIN NASAL SPRAY) 0.05 % nasal spray Place 1 spray into both nostrils 2 (two) times daily. 12/03/19   Hall-Potvin, [place], PA-C  rosuvastatin (CRESTOR) 5 MG tablet TAKE 1 TABLET BY MOUTH AT BEDTIME 12/01/19   [name], Maritza, PA-C  saline (AYR) GEL Place 1 application into  both nostrils at bedtime. 12/03/19   Hall-Potvin, [place], PA-C  Turmeric 500 MG CAPS Take by mouth daily.    [provider]    Family History Family History  Problem Relation Age of Onset  . Breast cancer Mother   . Melanoma Father   . Healthy Son     Social History Social History   Tobacco Use  . Smoking status: Never Smoker  . Smokeless tobacco: Never Used  Vaping Use  . Vaping Use: Never used  Substance Use Topics  . Alcohol use: No    Alcohol/week: 0.0 standard drinks  . Drug use: No     Allergies     Flagyl [metronidazole]   Review of Systems As per HPI   Physical Exam Triage Vital Signs ED Triage Vitals  Enc Vitals Group     BP      Pulse      Resp      Temp      Temp src      SpO2      Weight      Height      Head Circumference      Peak Flow      Pain Score      Pain Loc      Pain Edu?      Excl. in [place]?    No data found.  Updated Vital Signs BP (!) 143/84 (BP Location: Left Arm)   Pulse 76   Temp 98 F (36.7 C) (Oral)   Resp 20   SpO2 95%   Visual Acuity Right Eye Distance:   Left Eye Distance:   Bilateral Distance:    Right Eye Near:   Left Eye Near:    Bilateral Near:     Physical Exam Constitutional:      General: She is not in acute distress. HENT:     Head: Normocephalic and atraumatic.     Nose:     Comments: Left nare clear, right nare with anterior erythema.  No obvious laceration.  No foreign body bilaterally.  Nontender    Mouth/Throat:     Mouth: Mucous membranes are moist.     Pharynx: Oropharynx is clear. No oropharyngeal exudate or posterior oropharyngeal erythema.     Comments: No blood Eyes:     General: No scleral icterus.    Pupils: Pupils are equal, round, and reactive to light.  Cardiovascular:     Rate and Rhythm: Normal rate.  Pulmonary:     Effort: Pulmonary effort is normal.  Skin:    Coloration: Skin is not jaundiced or pale.  Neurological:     Mental Status: She is alert and oriented to person, place, and time.      UC Treatments / Results  Labs (all labs ordered are listed, but only abnormal results are displayed) Labs Reviewed - No data to display  EKG   Radiology No results found.  Procedures Procedures (including critical care time)  Medications Ordered in UC Medications - No data to display  Initial Impression / Assessment and Plan / UC Course  I have reviewed the triage vital signs and the nursing notes.  Pertinent labs & imaging results that were available during my care of the patient  were reviewed by me and considered in my medical decision making (see chart for details).     Anterior epistasis.  Low concern for posterior given reassuring exam.  Reviewed supportive care as below, follow-up with ENT as needed.  Return  precautions discussed, pt verbalized understanding and is agreeable to plan. Final Clinical Impressions(s) / UC Diagnoses   Final diagnoses:  Epistaxis     Discharge Instructions     Important to drink plenty of water. On blowing nose, do so gently. May use saline nasal gel at nighttime to help moisturize, or use humidifier. May use Afrin as needed for active nosebleeds.  Important to apply pressure to the nasal bridge with slight leaning forward. Do not lean back as this can cause you to swallow blood which can cause GI upset. Important follow-up with PCP for further evaluation and management if needed. Go to the ER for severe, persistent nosebleeds, lightheadedness, dizziness.    ED Prescriptions    Medication Sig Dispense Auth. Provider   oxymetazoline (AFRIN NASAL SPRAY) 0.05 % nasal spray Place 1 spray into both nostrils 2 (two) times daily. 30 mL Hall-Potvin, [place], PA-C   saline (AYR) GEL Place 1 application into both nostrils at bedtime. 14.1 g Hall-Potvin, [place], PA-C     PDMP not reviewed this encounter.   Hall-Potvin, [place], [place] 12/03/19 1028

## 2019-12-03 NOTE — Discharge Instructions (Addendum)
Important to drink plenty of water. On blowing nose, do so gently. May use saline nasal gel at nighttime to help moisturize, or use humidifier. May use Afrin as needed for active nosebleeds.  Important to apply pressure to the nasal bridge with slight leaning forward. Do not lean back as this can cause you to swallow blood which can cause GI upset. Important follow-up with PCP for further evaluation and management if needed. Go to the ER for severe, persistent nosebleeds, lightheadedness, dizziness.

## 2019-12-03 NOTE — ED Triage Notes (Signed)
Pt c/o nose bleed from rt nare since 4am over 4-5 times. No active bleeding at this time. Denies nasal congestion or cough.

## 2019-12-03 NOTE — Telephone Encounter (Signed)
Patient's son called in stating his mother woke up with a bloody nose and is wondering how to stop it and or control it.

## 2019-12-05 ENCOUNTER — Other Ambulatory Visit: Payer: Self-pay

## 2019-12-05 ENCOUNTER — Other Ambulatory Visit: Payer: Medicare PPO

## 2019-12-05 DIAGNOSIS — E782 Mixed hyperlipidemia: Secondary | ICD-10-CM

## 2019-12-05 DIAGNOSIS — E785 Hyperlipidemia, unspecified: Secondary | ICD-10-CM | POA: Diagnosis not present

## 2019-12-05 DIAGNOSIS — R7303 Prediabetes: Secondary | ICD-10-CM

## 2019-12-05 DIAGNOSIS — I10 Essential (primary) hypertension: Secondary | ICD-10-CM | POA: Diagnosis not present

## 2019-12-06 LAB — COMPREHENSIVE METABOLIC PANEL WITH GFR
ALT: 6 [IU]/L (ref 0–32)
AST: 19 [IU]/L (ref 0–40)
Albumin/Globulin Ratio: 2 (ref 1.2–2.2)
Albumin: 4.9 g/dL — ABNORMAL HIGH (ref 3.6–4.6)
Alkaline Phosphatase: 88 [IU]/L (ref 44–121)
BUN/Creatinine Ratio: 20 (ref 12–28)
BUN: 15 mg/dL (ref 8–27)
Bilirubin Total: 0.5 mg/dL (ref 0.0–1.2)
CO2: 24 mmol/L (ref 20–29)
Calcium: 10.2 mg/dL (ref 8.7–10.3)
Chloride: 105 mmol/L (ref 96–106)
Creatinine, Ser: 0.75 mg/dL (ref 0.57–1.00)
GFR calc Af Amer: 85 mL/min/{1.73_m2}
GFR calc non Af Amer: 74 mL/min/{1.73_m2}
Globulin, Total: 2.4 g/dL (ref 1.5–4.5)
Glucose: 112 mg/dL — ABNORMAL HIGH (ref 65–99)
Potassium: 4.2 mmol/L (ref 3.5–5.2)
Sodium: 139 mmol/L (ref 134–144)
Total Protein: 7.3 g/dL (ref 6.0–8.5)

## 2019-12-06 LAB — CBC
Hematocrit: 41.5 % (ref 34.0–46.6)
Hemoglobin: 13.5 g/dL (ref 11.1–15.9)
MCH: 30.7 pg (ref 26.6–33.0)
MCHC: 32.5 g/dL (ref 31.5–35.7)
MCV: 94 fL (ref 79–97)
Platelets: 202 10*3/uL (ref 150–450)
RBC: 4.4 x10E6/uL (ref 3.77–5.28)
RDW: 12.7 % (ref 11.7–15.4)
WBC: 6.8 10*3/uL (ref 3.4–10.8)

## 2019-12-06 LAB — LIPID PANEL
Chol/HDL Ratio: 2.5 ratio (ref 0.0–4.4)
Cholesterol, Total: 197 mg/dL (ref 100–199)
HDL: 80 mg/dL
LDL Chol Calc (NIH): 97 mg/dL (ref 0–99)
Triglycerides: 114 mg/dL (ref 0–149)
VLDL Cholesterol Cal: 20 mg/dL (ref 5–40)

## 2019-12-06 LAB — HEMOGLOBIN A1C
Est. average glucose Bld gHb Est-mCnc: 131 mg/dL
Hgb A1c MFr Bld: 6.2 % — ABNORMAL HIGH (ref 4.8–5.6)

## 2019-12-06 LAB — VITAMIN B12: Vitamin B-12: 1153 pg/mL (ref 232–1245)

## 2019-12-15 ENCOUNTER — Other Ambulatory Visit: Payer: Self-pay

## 2019-12-15 ENCOUNTER — Ambulatory Visit (INDEPENDENT_AMBULATORY_CARE_PROVIDER_SITE_OTHER): Payer: Medicare PPO | Admitting: Physician Assistant

## 2019-12-15 ENCOUNTER — Encounter: Payer: Self-pay | Admitting: Physician Assistant

## 2019-12-15 VITALS — BP 124/73 | HR 82 | Temp 97.8°F | Ht 66.0 in | Wt 165.8 lb

## 2019-12-15 DIAGNOSIS — Z0000 Encounter for general adult medical examination without abnormal findings: Secondary | ICD-10-CM

## 2019-12-15 DIAGNOSIS — E782 Mixed hyperlipidemia: Secondary | ICD-10-CM

## 2019-12-15 DIAGNOSIS — Z23 Encounter for immunization: Secondary | ICD-10-CM

## 2019-12-15 DIAGNOSIS — G20 Parkinson's disease: Secondary | ICD-10-CM | POA: Diagnosis not present

## 2019-12-15 DIAGNOSIS — R7303 Prediabetes: Secondary | ICD-10-CM | POA: Diagnosis not present

## 2019-12-15 DIAGNOSIS — W19XXXA Unspecified fall, initial encounter: Secondary | ICD-10-CM

## 2019-12-15 DIAGNOSIS — F330 Major depressive disorder, recurrent, mild: Secondary | ICD-10-CM

## 2019-12-15 DIAGNOSIS — I10 Essential (primary) hypertension: Secondary | ICD-10-CM | POA: Diagnosis not present

## 2019-12-15 DIAGNOSIS — R630 Anorexia: Secondary | ICD-10-CM

## 2019-12-15 DIAGNOSIS — G20C Parkinsonism, unspecified: Secondary | ICD-10-CM

## 2019-12-15 DIAGNOSIS — R040 Epistaxis: Secondary | ICD-10-CM

## 2019-12-15 NOTE — Progress Notes (Signed)
Established Patient Office Visit  Subjective:  Patient ID: Jamie [name], female    DOB: 15-May-1934  Age: 85 y.o. MRN: [id number]  CC:  Chief Complaint  Patient presents with  . Hypertension  . Hyperlipidemia    HPI Jamie [name] presents for follow up on hypertension and hyperlipidemia.  Reports a fall that occurred last week, does not exactly know how it happened but she stumbled and fell on the floor.  Denies head trauma, pain with ambulation or inability to bear weight.  Has bruising on left upper arm and low back /gluteal area.  HTN: Pt denies chest pain, palpitations, dizziness or lower extremity swelling. Checks BP at home and readings usually range <130/80. Pt follows a low salt diet.  HLD: Pt taking medication as directed without issues. Denies side effects including myalgias and RUQ pain. Denies wrosening cognition since starting medication.   Nosebleeds: Reports two episodes of nosebleeds and requesting referral to ENT. Denies trauma/injury, new medications, seasonal allergies or URI symptoms, and has not been taking aspirin for a while now.   Parkinson's disease: Followed by neurology-Dr. Carles Collet.  Patient was started on Lexapro 10 mg for mood and possible pseudodementia.  Patient continues to have decreased appetite and unsure if Lexapro is helping.  Reports medication compliance with carbidopa-levodopa.     Past Medical History:  Diagnosis Date  . Hypertension    only one time  . Right knee pain   . Spinal stenosis of lumbar region     Past Surgical History:  Procedure Laterality Date  . HYSTERECTOMY ABDOMINAL WITH SALPINGECTOMY    . TONSILLECTOMY      Family History  Problem Relation Age of Onset  . Breast cancer Mother   . Melanoma Father   . Healthy Son     Social History   Socioeconomic History  . Marital status: Widowed    Spouse name: Not on file  . Number of children: 3  . Years of education: 47  . Highest education level: High school  graduate  Occupational History  . Not on file  Tobacco Use  . Smoking status: Never Smoker  . Smokeless tobacco: Never Used  Vaping Use  . Vaping Use: Never used  Substance and Sexual Activity  . Alcohol use: No    Alcohol/week: 0.0 standard drinks  . Drug use: No  . Sexual activity: Not Currently  Other Topics Concern  . Not on file  Social History Narrative   Pt lives alone in her 1 story home- she has 3 sons, and a high school graduate. She is right handed, she drinks coffee, and one soda a week mostly water   Social Determinants of Health   Financial Resource Strain:   . Difficulty of Paying Living Expenses: Not on file  Food Insecurity:   . Worried About Charity fundraiser in the Last Year: Not on file  . Ran Out of Food in the Last Year: Not on file  Transportation Needs:   . Lack of Transportation (Medical): Not on file  . Lack of Transportation (Non-Medical): Not on file  Physical Activity:   . Days of Exercise per Week: Not on file  . Minutes of Exercise per Session: Not on file  Stress:   . Feeling of Stress : Not on file  Social Connections:   . Frequency of Communication with Friends and Family: Not on file  . Frequency of Social Gatherings with Friends and Family: Not on file  . Attends  Religious Services: Not on file  . Active Member of Clubs or Organizations: Not on file  . Attends Archivist Meetings: Not on file  . Marital Status: Not on file  Intimate Partner Violence:   . Fear of Current or Ex-Partner: Not on file  . Emotionally Abused: Not on file  . Physically Abused: Not on file  . Sexually Abused: Not on file    Outpatient Medications Prior to Visit  Medication Sig Dispense Refill  . aspirin 81 MG tablet Take 325 mg by mouth daily.     . Biotin 10000 MCG TABS Take by mouth. 1/2 tablet daily    . carbidopa-levodopa (SINEMET IR) 25-100 MG tablet TAKE 1 AND 1/2 TABLETS BY MOUTH 3 TIMES DAILY. 405 tablet 1  . Coenzyme Q10 (COQ-10) 100  MG CAPS Take 1 tablet by mouth daily.    . Cyanocobalamin (VITAMIN B 12 PO) Take by mouth. 5, 000 mcg daily    . escitalopram (LEXAPRO) 10 MG tablet Take 1 tablet (10 mg total) by mouth daily. 90 tablet 1  . Glucosamine-Chondroit-Vit C-Mn (GLUCOSAMINE 1500 COMPLEX PO) Glucosamine    . Misc Natural Products (TART CHERRY ADVANCED) CAPS Take by mouth. 2500mg  daily    . Multiple Vitamin (MULTIVITAMIN WITH MINERALS) TABS tablet Take 1 tablet by mouth daily.    . Multiple Vitamins-Minerals (PRESERVISION AREDS 2 PO) Take 1 tablet by mouth in the morning and at bedtime.     Marland Kitchen oxymetazoline (AFRIN NASAL SPRAY) 0.05 % nasal spray Place 1 spray into both nostrils 2 (two) times daily. 30 mL 0  . rosuvastatin (CRESTOR) 5 MG tablet TAKE 1 TABLET BY MOUTH AT BEDTIME 90 tablet 0  . saline (AYR) GEL Place 1 application into both nostrils at bedtime. 14.1 g 0  . Turmeric 500 MG CAPS Take by mouth daily.     No facility-administered medications prior to visit.    Allergies  Allergen Reactions  . Flagyl [Metronidazole] Rash    Possible cause of rash     ROS Review of Systems A fourteen system review of systems was performed and found to be positive as per HPI.   Objective:    Physical Exam General:  Well Developed, well nourished, appropriate for stated age.  Neuro:  Alert and oriented,  extra-ocular muscles intact  HEENT:  Normocephalic, atraumatic, neck supple Skin: Large healing bruise of left upper arm noted. Cardiac:  RRR, S1 S2 Respiratory:  ECTA B/L and A/P, Not using accessory muscles, speaking in full sentences- unlabored. MSK: No bony abnormalities noted, some tenderness to palpation of gluteus medius on left side, no TTP of left greater trochanter Vascular:  Ext warm, no cyanosis apprec.; cap RF less 2 sec. Psych:  No HI/SI, judgement and insight good, Euthymic mood. Full Affect.   BP 124/73   Pulse 82   Temp 97.8 F (36.6 C) (Oral)   Ht 5\' 6"  (1.676 m)   Wt 165 lb 12.8 oz (75.2  kg)   SpO2 99% Comment: on RA  BMI 26.76 kg/m  Wt Readings from Last 3 Encounters:  12/15/19 165 lb 12.8 oz (75.2 kg)  12/01/19 169 lb (76.7 kg)  10/01/19 168 lb 11.2 oz (76.5 kg)     Health Maintenance Due  Topic Date Due  . COVID-19 Vaccine (1) Never done  . DEXA SCAN  Never done    There are no preventive care reminders to display for this patient.  Lab Results  Component Value Date   TSH  2.990 10/01/2019   Lab Results  Component Value Date   WBC 6.8 12/05/2019   HGB 13.5 12/05/2019   HCT 41.5 12/05/2019   MCV 94 12/05/2019   PLT 202 12/05/2019   Lab Results  Component Value Date   NA 139 12/05/2019   K 4.2 12/05/2019   CO2 24 12/05/2019   GLUCOSE 112 (H) 12/05/2019   BUN 15 12/05/2019   CREATININE 0.75 12/05/2019   BILITOT 0.5 12/05/2019   ALKPHOS 88 12/05/2019   AST 19 12/05/2019   ALT 6 12/05/2019   PROT 7.3 12/05/2019   ALBUMIN 4.9 (H) 12/05/2019   CALCIUM 10.2 12/05/2019   ANIONGAP 7 08/15/2016   Lab Results  Component Value Date   CHOL 197 12/05/2019   Lab Results  Component Value Date   HDL 80 12/05/2019   Lab Results  Component Value Date   LDLCALC 97 12/05/2019   Lab Results  Component Value Date   TRIG 114 12/05/2019   Lab Results  Component Value Date   CHOLHDL 2.5 12/05/2019   Lab Results  Component Value Date   HGBA1C 6.2 (H) 12/05/2019      Assessment & Plan:   Problem List Items Addressed This Visit      Cardiovascular and Mediastinum   Essential hypertension - Primary (Chronic)     Other   Mixed hyperlipidemia   Prediabetes    Other Visit Diagnoses    Parkinsonism, unspecified Parkinsonism type ([place])       Mild episode of recurrent major depressive disorder ([place])       Recurrent epistaxis       Relevant Orders   Ambulatory referral to ENT   Decreased appetite       Need for influenza vaccination       Relevant Orders   Flu Vaccine QUAD High Dose(Fluad) (Completed)   Fall, initial encounter        Healthcare maintenance          Parkinson's disease: -Followed by neurology. -Continue current medication regimen.  Mild episode of recurrent major depressive disorder: -PHQ-9 score of 6, PHQ 2 score of 0 -Continue current medication regimen. -Will continue to monitor alongside neurology. Mood disorder can be related to PD.  Decreased appetite: -Nonmotor symptoms can be related to PD and encourage patient to eat 3 small meals throughout the day with intermittent snacks. -Recommend to stay well hydrated. -Recommend discussing with Dr. Carles Collet if increasing Lexapro would be beneficial. Alternative options that could potentially help with appetite and mood is Mirtazapine.   Essential hypertension: -BP stable -Continue low-sodium diet. -Stay well-hydrated. -We will continue to monitor. -Received CMP: Renal function and electrolytes WNL  Mixed hyperlipidemia: -Recent lipid panel WNL -Continue current medication regimen. -Follow a heart healthy diet low in saturated and trans fats. -Will continue to monitor.  Prediabetes: -Recent A1c improved from prior and stable. -Follow a low carbohydrate and glucose diet. -Will continue to monitor.  Recurrent epistaxis: -Placed ENT referral. -Use Afrin as needed for recurrent nosebleeds and start using a humidifier. -Recent CBC within normal limits, platelets 202  Fall, initial encounter: -Discussed with patient obtaining imaging studies and prefers to wait.  Advised to let me know if starts having any ambulation problems and will place orders for lumbar and pelvis x-ray.  Patient verbalized understanding.  Healthcare maintenance: -Most recent labs obtained are essentially within normal limits or stable from prior.  Vitamin B12 level has normalized.  Recommend to continue with current dose of cyanocobalamin. -Agreeable  to flu vaccine today so will place order.   No orders of the defined types were placed in this encounter.   Follow-up:  Return in about 4 months (around 04/16/2020) for [place].   Note:  This note was prepared with assistance of Dragon voice recognition software. Occasional wrong-word or sound-a-like substitutions may have occurred due to the inherent limitations of voice recognition software.  Lorrene Reid, PA-C

## 2019-12-15 NOTE — Patient Instructions (Signed)
Nosebleed, Adult A nosebleed is when blood comes out of the nose. Nosebleeds are common. Usually, they are not a sign of a serious condition. Nosebleeds can happen if a small blood vessel in your nose starts to bleed or if the lining of your nose (mucous membrane) cracks. They are commonly caused by:  Allergies.  Colds.  Picking your nose.  Blowing your nose too hard.  An injury from sticking an object into your nose or getting hit in the nose.  Dry or cold air. Less common causes of nosebleeds include:  Toxic fumes.  Something abnormal in the nose or in the air-filled spaces in the bones of the face (sinuses).  Growths in the nose, such as polyps.  Medicines or conditions that cause blood to clot slowly.  Certain illnesses or procedures that irritate or dry out the nasal passages. Follow these instructions at home: When you have a nosebleed:   Sit down and tilt your head slightly forward.  Use a clean towel or tissue to pinch your nostrils under the bony part of your nose. After 10 minutes, let go of your nose and see if bleeding starts again. Do not release pressure before that time. If there is still bleeding, repeat the pinching and holding for 10 minutes until the bleeding stops.  Do not place tissues or gauze in the nose to stop bleeding.  Avoid lying down and avoid tilting your head backward. That may make blood collect in the throat and cause gagging or coughing.  Use a nasal spray decongestant to help with a nosebleed as told by your health care provider.  Do not use petroleum jelly or mineral oil in your nose. It can drip into your lungs. After a nosebleed:  Avoid blowing your nose or sniffing for a number of hours.  Avoid straining, lifting, or bending at the waist for several days. You may resume other normal activities as you are able.  Use saline spray or a humidifier as told by your health care provider.  Aspirinand blood thinners make bleeding more  likely. If you are prescribed these medicines and you suffer from nosebleeds: ? Ask your health care provider if you should stop taking the medicines or if you should adjust the dose. ? Do not stop taking medicines that your health care provider has recommended unless told by your health care provider.  If your nosebleed was caused by dry mucous membranes, use over-the-counter saline nasal spray or gel. This will keep the mucous membranes moist and allow them to heal. If you must use a lubricant: ? Choose one that is water-soluble. ? Use only as much as you need and use it only as often as needed. ? Do not lie down until several hours after you use it. Contact a health care provider if:  You have a fever.  You get nosebleeds often or more often than usual.  You bruise very easily.  You have a nosebleed from having something stuck in your nose.  You have bleeding in your mouth.  You vomit or cough up brown material.  You have a nosebleed after you start a new medicine. Get help right away if:  You have a nosebleed after a fall or a head injury.  Your nosebleed does not go away after 20 minutes.  You feel dizzy or weak.  You have unusual bleeding from other parts of your body.  You have unusual bruising on other parts of your body.  You become sweaty.  You   vomit blood. This information is not intended to replace advice given to you by your health care provider. Make sure you discuss any questions you have with your health care provider. Document Revised: 05/08/2017 Document Reviewed: 08/24/2015 Elsevier Patient Education  2020 [employer]  

## 2019-12-16 ENCOUNTER — Encounter (INDEPENDENT_AMBULATORY_CARE_PROVIDER_SITE_OTHER): Payer: Self-pay | Admitting: Otolaryngology

## 2019-12-16 ENCOUNTER — Ambulatory Visit (INDEPENDENT_AMBULATORY_CARE_PROVIDER_SITE_OTHER): Payer: Medicare PPO | Admitting: Otolaryngology

## 2019-12-16 VITALS — Temp 97.2°F

## 2019-12-16 DIAGNOSIS — R040 Epistaxis: Secondary | ICD-10-CM | POA: Diagnosis not present

## 2019-12-16 NOTE — Progress Notes (Signed)
HPI: Jamie [name] is a 85 y.o. female who presents for evaluation of right-sided epistaxis that she has had over the past month.  Apparently she has had 4 nosebleeds over the past month off on the right side.  She takes 81 mg aspirin and daily but no other blood thinners.  She was recently seen at urgent care because of a nosebleed and was prescribed an ointment to use. She presents today with her daughter-in-law.  Past Medical History:  Diagnosis Date  . Hypertension    only one time  . Right knee pain   . Spinal stenosis of lumbar region    Past Surgical History:  Procedure Laterality Date  . HYSTERECTOMY ABDOMINAL WITH SALPINGECTOMY    . TONSILLECTOMY     Social History   Socioeconomic History  . Marital status: Widowed    Spouse name: Not on file  . Number of children: 3  . Years of education: 44  . Highest education level: High school graduate  Occupational History  . Not on file  Tobacco Use  . Smoking status: Never Smoker  . Smokeless tobacco: Never Used  Vaping Use  . Vaping Use: Never used  Substance and Sexual Activity  . Alcohol use: No    Alcohol/week: 0.0 standard drinks  . Drug use: No  . Sexual activity: Not Currently  Other Topics Concern  . Not on file  Social History Narrative   Pt lives alone in her 1 story home- she has 3 sons, and a high school graduate. She is right handed, she drinks coffee, and one soda a week mostly water   Social Determinants of Health   Financial Resource Strain:   . Difficulty of Paying Living Expenses: Not on file  Food Insecurity:   . Worried About Charity fundraiser in the Last Year: Not on file  . Ran Out of Food in the Last Year: Not on file  Transportation Needs:   . Lack of Transportation (Medical): Not on file  . Lack of Transportation (Non-Medical): Not on file  Physical Activity:   . Days of Exercise per Week: Not on file  . Minutes of Exercise per Session: Not on file  Stress:   . Feeling of Stress :  Not on file  Social Connections:   . Frequency of Communication with Friends and Family: Not on file  . Frequency of Social Gatherings with Friends and Family: Not on file  . Attends Religious Services: Not on file  . Active Member of Clubs or Organizations: Not on file  . Attends Archivist Meetings: Not on file  . Marital Status: Not on file   Family History  Problem Relation Age of Onset  . Breast cancer Mother   . Melanoma Father   . Healthy Son    Allergies  Allergen Reactions  . Flagyl [Metronidazole] Rash    Possible cause of rash    Prior to Admission medications   Medication Sig Start Date End Date Taking? Authorizing Provider  aspirin 81 MG tablet Take 325 mg by mouth daily.    Yes [provider]  Biotin 10000 MCG TABS Take by mouth. 1/2 tablet daily   Yes [provider]  carbidopa-levodopa (SINEMET IR) 25-100 MG tablet TAKE 1 AND 1/2 TABLETS BY MOUTH 3 TIMES DAILY. 09/01/19  Yes Tat, Eustace Quail, DO  Coenzyme Q10 (COQ-10) 100 MG CAPS Take 1 tablet by mouth daily.   Yes [provider]  Cyanocobalamin (VITAMIN B 12  PO) Take by mouth. 5, 000 mcg daily   Yes [provider]  escitalopram (LEXAPRO) 10 MG tablet Take 1 tablet (10 mg total) by mouth daily. 07/10/19  Yes Tat, Eustace Quail, DO  Glucosamine-Chondroit-Vit C-Mn (GLUCOSAMINE 1500 COMPLEX PO) Glucosamine   Yes [provider]  Misc Natural Products (TART CHERRY ADVANCED) CAPS Take by mouth. 2500mg  daily   Yes [provider]  Multiple Vitamin (MULTIVITAMIN WITH MINERALS) TABS tablet Take 1 tablet by mouth daily.   Yes [provider]  Multiple Vitamins-Minerals (PRESERVISION AREDS 2 PO) Take 1 tablet by mouth in the morning and at bedtime.    Yes [provider]  oxymetazoline (AFRIN NASAL SPRAY) 0.05 % nasal spray Place 1 spray into both nostrils 2 (two) times daily. 12/03/19  Yes Hall-Potvin, [place], PA-C  rosuvastatin (CRESTOR) 5 MG  tablet TAKE 1 TABLET BY MOUTH AT BEDTIME 12/01/19  Yes [name], Maritza, PA-C  saline (AYR) GEL Place 1 application into both nostrils at bedtime. 12/03/19  Yes Hall-Potvin, [place], PA-C  Turmeric 500 MG CAPS Take by mouth daily.   Yes [provider]     Positive ROS: Otherwise negative  All other systems have been reviewed and were otherwise negative with the exception of those mentioned in the HPI and as above.  Physical Exam: Constitutional: Alert, well-appearing, no acute distress Ears: External ears without lesions or tenderness. Ear canals are clear bilaterally with intact, clear TMs.  Nasal: External nose without lesions. Septum midline..  On intranasal exam nasal passages were clear.  She had several prominent vessels in Kiesselbach's plexus on the right side of the septum but I cannot identify definite site of origin of epistaxis.  Patient cannot create any bleeding when she blew her nose hard.  Did not demonstrate any bleeding when I rubbed gently with a Q-tip. Oral: Lips and gums without lesions. Tongue and palate mucosa without lesions. Posterior oropharynx clear. Neck: No palpable adenopathy or masses Respiratory: Breathing comfortably  Skin: No facial/neck lesions or rash noted.  Procedures  Assessment: Right sided epistaxis most likely from [place] plexus  Plan: Discussed with patient and her daughter-in-law concerning use of the ointment as previously prescribed.  Also reviewed with him concerning use of cotton ball and Afrin or cold water nasal packing to help stop bleeding when it occurs.  She will follow-up for recheck the next time she has a bad nosebleed for cauterization if needed.  But unfortunately today cannot identify definite site of origin of her epistaxis.  Radene Journey, MD

## 2019-12-16 NOTE — Progress Notes (Signed)
Assessment/Plan:   1.  Parkinsons Disease/SWEDD  -Continue carbidopa/levodopa 25/100, 1.5 tablets 3 times per day  -Would be interesting to see the patient's DaT scan now, several years later. There is evidence to suggest that Hindman may represent a different disease state that Parkinson's disease, although the details of this are really not known.  2. Insomnia  -continue melatonin, helped significantly  3. Depression  -increase Lexapro, 20 mg daily.  If not helpful, may try mirtazapine as also having some trouble with appetite.   4. Memory change  -Suspect more than MCI.   Subjective:   Jamie [name] was seen today in follow up for Parkinsons disease/SWEDD.  My previous records were reviewed prior to todays visit as well as outside records available to me. Daughter supplements hx.  One fall - tripped over a tray.  No significant injury.  Got right back up on her own.  Pt with occasional lightheadedness, but no near syncope.  No hallucinations.   Told patient to add melatonin last visit for insomnia and she states that she is doing well with that. We also started Lexapro, 10 mg daily for mood. She reports that she is still getting anxious.  Appetite is also an issue.    Current prescribed movement disorder medications: Carbidopa/levodopa 25/100, 1.5 tablets 3 times per day Melatonin, 3 mg daily (added last visit) Lexapro, 10 mg daily (started last visit)  ALLERGIES:   Allergies  Allergen Reactions  . Flagyl [Metronidazole] Rash    Possible cause of rash     CURRENT MEDICATIONS:  Outpatient Encounter Medications as of 12/24/2019  Medication Sig  . Biotin 10000 MCG TABS Take by mouth. 1/2 tablet daily  . carbidopa-levodopa (SINEMET IR) 25-100 MG tablet TAKE 1 AND 1/2 TABLETS BY MOUTH 3 TIMES DAILY.  Marland Kitchen Coenzyme Q10 (COQ-10) 100 MG CAPS Take 1 tablet by mouth daily.  . Cyanocobalamin (VITAMIN B 12 PO) Take by mouth. 5, 000 mcg daily  . escitalopram (LEXAPRO) 10 MG tablet  Take 1 tablet (10 mg total) by mouth daily.  . Glucosamine-Chondroit-Vit C-Mn (GLUCOSAMINE 1500 COMPLEX PO) Take 1 tablet by mouth daily.   . Multiple Vitamin (MULTIVITAMIN WITH MINERALS) TABS tablet Take 1 tablet by mouth daily.  . Multiple Vitamins-Minerals (PRESERVISION AREDS 2 PO) Take 1 tablet by mouth in the morning and at bedtime.   . rosuvastatin (CRESTOR) 5 MG tablet TAKE 1 TABLET BY MOUTH AT BEDTIME  . saline (AYR) GEL Place 1 application into both nostrils at bedtime.  . [DISCONTINUED] aspirin 81 MG tablet Take 325 mg by mouth daily.  (Patient not taking: Reported on 12/24/2019)  . [DISCONTINUED] Misc Natural Products (TART CHERRY ADVANCED) CAPS Take by mouth. 2500mg  daily (Patient not taking: Reported on 12/24/2019)  . [DISCONTINUED] oxymetazoline (AFRIN NASAL SPRAY) 0.05 % nasal spray Place 1 spray into both nostrils 2 (two) times daily. (Patient not taking: Reported on 12/24/2019)  . [DISCONTINUED] Turmeric 500 MG CAPS Take by mouth daily. (Patient not taking: Reported on 12/24/2019)   No facility-administered encounter medications on file as of 12/24/2019.    Objective:   PHYSICAL EXAMINATION:    VITALS:   Vitals:   12/24/19 0934  BP: 132/75  Pulse: 81  SpO2: 98%  Weight: 166 lb (75.3 kg)  Height: 5\' 6"  (1.676 m)    GEN:  The patient appears stated age and is in NAD. HEENT:  Normocephalic, atraumatic.  The mucous membranes are moist. The superficial temporal arteries are without ropiness or tenderness.  CV:  RRR Lungs:  CTAB Neck/HEME:  There are no carotid bruits bilaterally.  Neurological examination:  Orientation: The patient is alert and oriented x3. Cranial nerves: There is good facial symmetry with facial hypomimia. The speech is fluent and clear. Soft palate rises symmetrically and there is no tongue deviation. Hearing is intact to conversational tone. Sensation: Sensation is intact to light touch throughout Motor: Strength is at least antigravity  x4.  Movement examination: Tone: There is nl tone in the UE/LE Abnormal movements: none Coordination:  There is mild decremation with RAM's, with any form of RAMS, including alternating supination and pronation of the forearm, hand opening and closing, finger taps, heel taps and toe taps. Gait and Station: The patient has no difficulty arising out of a deep-seated chair without the use of the hands. The patient's stride length is decreased.    I have reviewed and interpreted the following labs independently    Chemistry      Component Value Date/Time   NA 139 12/05/2019 0842   K 4.2 12/05/2019 0842   CL 105 12/05/2019 0842   CO2 24 12/05/2019 0842   BUN 15 12/05/2019 0842   CREATININE 0.75 12/05/2019 0842   GLU 98 05/30/2017 0000      Component Value Date/Time   CALCIUM 10.2 12/05/2019 0842   ALKPHOS 88 12/05/2019 0842   AST 19 12/05/2019 0842   ALT 6 12/05/2019 0842   BILITOT 0.5 12/05/2019 0842       Lab Results  Component Value Date   WBC 6.8 12/05/2019   HGB 13.5 12/05/2019   HCT 41.5 12/05/2019   MCV 94 12/05/2019   PLT 202 12/05/2019    Lab Results  Component Value Date   TSH 2.990 10/01/2019     Total time spent on today's visit was 30 minutes, including both face-to-face time and nonface-to-face time.  Time included that spent on review of records (prior notes available to me/labs/imaging if pertinent), discussing treatment and goals, answering patient's questions and coordinating care.  Cc:  Lorrene Reid, PA-C

## 2019-12-18 ENCOUNTER — Ambulatory Visit (INDEPENDENT_AMBULATORY_CARE_PROVIDER_SITE_OTHER): Payer: Medicare PPO | Admitting: Otolaryngology

## 2019-12-24 ENCOUNTER — Encounter: Payer: Self-pay | Admitting: Neurology

## 2019-12-24 ENCOUNTER — Ambulatory Visit: Payer: Medicare PPO | Admitting: Neurology

## 2019-12-24 ENCOUNTER — Other Ambulatory Visit: Payer: Self-pay

## 2019-12-24 VITALS — BP 132/75 | HR 81 | Ht 66.0 in | Wt 166.0 lb

## 2019-12-24 DIAGNOSIS — F330 Major depressive disorder, recurrent, mild: Secondary | ICD-10-CM

## 2019-12-24 DIAGNOSIS — G20 Parkinson's disease: Secondary | ICD-10-CM

## 2019-12-24 MED ORDER — ESCITALOPRAM OXALATE 20 MG PO TABS: 20.0000 mg | ORAL_TABLET | Freq: Every day | ORAL | 1 refills | Status: DC

## 2019-12-24 NOTE — Patient Instructions (Addendum)
1.  Increase lexapro, 20 mg daily 2.  Happy holidays!  The physicians and staff at [facility] are committed to providing excellent care. You may receive a survey requesting feedback about your experience at our office. We strive to receive "very good" responses to the survey questions. If you feel that your experience would prevent you from giving the office a "very good " response, please contact our office to try to remedy the situation. We may be reached at [phone]. Thank you for taking the time out of your busy day to complete the survey.

## 2019-12-25 ENCOUNTER — Ambulatory Visit (INDEPENDENT_AMBULATORY_CARE_PROVIDER_SITE_OTHER): Payer: Medicare PPO | Admitting: Otolaryngology

## 2019-12-25 VITALS — Temp 96.8°F

## 2019-12-25 DIAGNOSIS — R040 Epistaxis: Secondary | ICD-10-CM

## 2019-12-25 NOTE — Progress Notes (Signed)
HPI: Jamie [name] is a 85 y.o. female who returns today for evaluation of recurrent right-sided epistaxis.  She was seen 2 weeks ago but I cannot identify definite site of origin of the epistaxis.  She had another nosebleed this morning that she was able to stop with a cottonball and presents here for possible cauterization.  Past Medical History:  Diagnosis Date  . Hypertension    only one time  . Right knee pain   . Spinal stenosis of lumbar region    Past Surgical History:  Procedure Laterality Date  . HYSTERECTOMY ABDOMINAL WITH SALPINGECTOMY    . TONSILLECTOMY     Social History   Socioeconomic History  . Marital status: Widowed    Spouse name: Not on file  . Number of children: 3  . Years of education: 73  . Highest education level: High school graduate  Occupational History  . Not on file  Tobacco Use  . Smoking status: Never Smoker  . Smokeless tobacco: Never Used  Vaping Use  . Vaping Use: Never used  Substance and Sexual Activity  . Alcohol use: No    Alcohol/week: 0.0 standard drinks  . Drug use: No  . Sexual activity: Not Currently  Other Topics Concern  . Not on file  Social History Narrative   Pt lives alone in her 1 story home- she has 3 sons, and a high school graduate. She is right handed, she drinks coffee, and one soda a week mostly water   Social Determinants of Health   Financial Resource Strain:   . Difficulty of Paying Living Expenses: Not on file  Food Insecurity:   . Worried About Charity fundraiser in the Last Year: Not on file  . Ran Out of Food in the Last Year: Not on file  Transportation Needs:   . Lack of Transportation (Medical): Not on file  . Lack of Transportation (Non-Medical): Not on file  Physical Activity:   . Days of Exercise per Week: Not on file  . Minutes of Exercise per Session: Not on file  Stress:   . Feeling of Stress : Not on file  Social Connections:   . Frequency of Communication with Friends and Family: Not  on file  . Frequency of Social Gatherings with Friends and Family: Not on file  . Attends Religious Services: Not on file  . Active Member of Clubs or Organizations: Not on file  . Attends Archivist Meetings: Not on file  . Marital Status: Not on file   Family History  Problem Relation Age of Onset  . Breast cancer Mother   . Melanoma Father   . Healthy Son    Allergies  Allergen Reactions  . Flagyl [Metronidazole] Rash    Possible cause of rash    Prior to Admission medications   Medication Sig Start Date End Date Taking? Authorizing Provider  Biotin 10000 MCG TABS Take by mouth. 1/2 tablet daily   Yes [provider]  carbidopa-levodopa (SINEMET IR) 25-100 MG tablet TAKE 1 AND 1/2 TABLETS BY MOUTH 3 TIMES DAILY. 09/01/19  Yes Tat, Eustace Quail, DO  Coenzyme Q10 (COQ-10) 100 MG CAPS Take 1 tablet by mouth daily.   Yes [provider]  Cyanocobalamin (VITAMIN B 12 PO) Take by mouth. 5, 000 mcg daily   Yes [provider]  escitalopram (LEXAPRO) 20 MG tablet Take 1 tablet (20 mg total) by mouth daily. 12/24/19  Yes Tat, Eustace Quail, DO  Glucosamine-Chondroit-Vit  C-Mn (GLUCOSAMINE 1500 COMPLEX PO) Take 1 tablet by mouth daily.    Yes [provider]  Multiple Vitamin (MULTIVITAMIN WITH MINERALS) TABS tablet Take 1 tablet by mouth daily.   Yes [provider]  Multiple Vitamins-Minerals (PRESERVISION AREDS 2 PO) Take 1 tablet by mouth in the morning and at bedtime.    Yes [provider]  rosuvastatin (CRESTOR) 5 MG tablet TAKE 1 TABLET BY MOUTH AT BEDTIME 12/01/19  Yes [name], Maritza, PA-C  saline (AYR) GEL Place 1 application into both nostrils at bedtime. 12/03/19  Yes Hall-Potvin, [place], PA-C     Positive ROS: Otherwise negative  All other systems have been reviewed and were otherwise negative with the exception of those mentioned in the HPI and as above.  Physical Exam: Constitutional: Alert, well-appearing, no  acute distress Ears: External ears without lesions or tenderness. Ear canals are clear bilaterally with intact, clear TMs.  Nasal: External nose without lesions. Septum midline..  She has a prominent vessel anteriorly along the right side of septum and had a small scab that bled briskly when I removed the scab.  This was cauterized using silver nitrate.  She tolerated this well. Oral: Lips and gums without lesions. Tongue and palate mucosa without lesions. Posterior oropharynx clear. Neck: No palpable adenopathy or masses Respiratory: Breathing comfortably  Skin: No facial/neck lesions or rash noted.  Control of epistaxis  Date/Time: 12/25/2019 11:03 AM Performed by: Rozetta Nunnery, MD Authorized by: Rozetta Nunnery, MD   Consent:    Consent obtained:  Verbal   Consent given by:  Patient   Risks discussed:  Bleeding and pain   Alternatives discussed:  No treatment and observation Anesthesia:    Anesthesia method:  None Procedure details:    Treatment site:  R anterior   Treatment method:  Silver nitrate   Treatment complexity:  Limited   Treatment episode: initial   Post-procedure details:    Assessment:  Bleeding stopped   Patient tolerance of procedure:  Tolerated well, no immediate complications Comments:     Cauterized a right anterior inferior septal vessel with silver nitrate.  She tolerated this well.    Assessment: Right sided epistaxis from anterior inferior septal vessel in Kiesselbach's plexus region.  Plan: This was cauterized in the office today using silver nitrate.   Radene Journey, MD

## 2020-01-12 DIAGNOSIS — B351 Tinea unguium: Secondary | ICD-10-CM | POA: Diagnosis not present

## 2020-01-12 DIAGNOSIS — I739 Peripheral vascular disease, unspecified: Secondary | ICD-10-CM | POA: Diagnosis not present

## 2020-01-12 DIAGNOSIS — L600 Ingrowing nail: Secondary | ICD-10-CM | POA: Diagnosis not present

## 2020-01-12 DIAGNOSIS — M792 Neuralgia and neuritis, unspecified: Secondary | ICD-10-CM | POA: Diagnosis not present

## 2020-02-09 NOTE — Progress Notes (Signed)
[facility] [address] [place] Phone: [phone] Subjective:   I Jamie [name] am serving as a Education administrator for Dr. Hulan Saas.  This visit occurred during the SARS-CoV-2 public health emergency.  Safety protocols were in place, including screening questions prior to the visit, additional usage of staff PPE, and extensive cleaning of exam room while observing appropriate contact time as indicated for disinfecting solutions.   I'm seeing this patient by the request  of:  Jamie Reid, PA-C  CC: knee pain f/u   [id number]   12/01/2019 Bilateral injections given today, tolerated procedure well, patient has this chronic problem with exacerbation.  Due to other comorbidities and patient's age this seems to be the safest thing for her.  Can inject every 10 to 12 weeks.  Patient will follow up again in 10 to 12 weeks   Update 02/10/2020 Jamie [name] is a 85 y.o. female coming in with complaint of right knee pain. Patient here for right knee injections. Right knee pain is worse than left. Believes the injections helped.  States that it is starting to affect daily activities again.  Does respond well to the injections each time she has had on.  We have attempted viscosupplementation previously with very minimal benefit.  Patient feels like she gets more improvement with the steroid injections.     Past Medical History:  Diagnosis Date  . Hypertension    only one time  . Right knee pain   . Spinal stenosis of lumbar region    Past Surgical History:  Procedure Laterality Date  . HYSTERECTOMY ABDOMINAL WITH SALPINGECTOMY    . TONSILLECTOMY     Social History   Socioeconomic History  . Marital status: Widowed    Spouse name: Not on file  . Number of children: 3  . Years of education: 49  . Highest education level: High school graduate  Occupational History  . Not on file  Tobacco Use  . Smoking status: Never Smoker  .  Smokeless tobacco: Never Used  Vaping Use  . Vaping Use: Never used  Substance and Sexual Activity  . Alcohol use: No    Alcohol/week: 0.0 standard drinks  . Drug use: No  . Sexual activity: Not Currently  Other Topics Concern  . Not on file  Social History Narrative   Pt lives alone in her 1 story home- she has 3 sons, and a high school graduate. She is right handed, she drinks coffee, and one soda a week mostly water   Social Determinants of Health   Financial Resource Strain: Not on file  Food Insecurity: Not on file  Transportation Needs: Not on file  Physical Activity: Not on file  Stress: Not on file  Social Connections: Not on file   Allergies  Allergen Reactions  . Flagyl [Metronidazole] Rash    Possible cause of rash    Family History  Problem Relation Age of Onset  . Breast cancer Mother   . Melanoma Father   . Healthy Son      Current Outpatient Medications (Cardiovascular):  .  rosuvastatin (CRESTOR) 5 MG tablet, TAKE 1 TABLET BY MOUTH AT BEDTIME  Current Outpatient Medications (Respiratory):  .  saline (AYR) GEL, Place 1 application into both nostrils at bedtime.   Current Outpatient Medications (Hematological):  Marland Kitchen  Cyanocobalamin (VITAMIN B 12 PO), Take by mouth. 5, 000 mcg daily  Current Outpatient Medications (Other):  .  Biotin 10000 MCG TABS, Take  by mouth. 1/2 tablet daily .  carbidopa-levodopa (SINEMET IR) 25-100 MG tablet, TAKE 1 AND 1/2 TABLETS BY MOUTH 3 TIMES DAILY. Marland Kitchen  Coenzyme Q10 (COQ-10) 100 MG CAPS, Take 1 tablet by mouth daily. Marland Kitchen  escitalopram (LEXAPRO) 20 MG tablet, Take 1 tablet (20 mg total) by mouth daily. .  Glucosamine-Chondroit-Vit C-Mn (GLUCOSAMINE 1500 COMPLEX PO), Take 1 tablet by mouth daily.  .  Multiple Vitamin (MULTIVITAMIN WITH MINERALS) TABS tablet, Take 1 tablet by mouth daily. .  Multiple Vitamins-Minerals (PRESERVISION AREDS 2 PO), Take 1 tablet by mouth in the morning and at bedtime.    Reviewed prior external  information including notes and imaging from  primary care provider As well as notes that were available from care everywhere and other healthcare systems.  Past medical history, social, surgical and family history all reviewed in electronic medical record.  No pertanent information unless stated regarding to the chief complaint.   Review of Systems:  No headache, visual changes, nausea, vomiting, diarrhea, constipation, dizziness, abdominal pain, skin rash, fevers, chills, night sweats, weight loss, swollen lymph nodes,  joint swelling, chest pain, shortness of breath, mood changes. POSITIVE muscle aches, body aches  Objective  Blood pressure 110/70, pulse 79, height 5\' 6"  (1.676 m), weight 166 lb (75.3 kg), SpO2 97 %.   General: No apparent distress alert and oriented x3 mood and affect normal, dressed appropriately.  Mild masked facies HEENT: Pupils equal, extraocular movements intact  Respiratory: Patient's speak in full sentences and does not appear short of breath  Shuffling gait Knee: Right valgus deformity noted.  Abnormal thigh to calf ratio.  Tender to palpation over medial and PF joint line.  ROM limited with some hypertonicity noted.. instability with valgus force.  painful patellar compression. Patellar glide with moderate crepitus. Patellar and quadriceps tendons unremarkable. Hamstring and quadriceps strength is normal. Contralateral knee shows arthritic changes with some hypertonicity but nontender  After informed written and verbal consent, patient was seated on exam table. Right knee was prepped with alcohol swab and utilizing anterolateral approach, patient's right knee space was injected with 4:1  marcaine 0.5%: Kenalog 40mg /dL. Patient tolerated the procedure well without immediate complications.    Impression and Recommendations:     The above documentation has been reviewed and is accurate and complete Lyndal Pulley, DO

## 2020-02-10 ENCOUNTER — Encounter: Payer: Self-pay | Admitting: Family Medicine

## 2020-02-10 ENCOUNTER — Other Ambulatory Visit: Payer: Self-pay

## 2020-02-10 ENCOUNTER — Ambulatory Visit: Payer: Medicare PPO | Admitting: Family Medicine

## 2020-02-10 DIAGNOSIS — M1711 Unilateral primary osteoarthritis, right knee: Secondary | ICD-10-CM | POA: Diagnosis not present

## 2020-02-10 NOTE — Patient Instructions (Addendum)
See me again in 10-12 weeks. 

## 2020-02-10 NOTE — Assessment & Plan Note (Signed)
Right knee pain today.  Chronic problem with exacerbation.  Discussed icing regimen and home exercises.  Patient wants to hold on anything else.  Patient's underlying problems including the Parkinson's likely contributing.  Follow-up again in 10 to 12 weeks

## 2020-02-26 DIAGNOSIS — H52203 Unspecified astigmatism, bilateral: Secondary | ICD-10-CM | POA: Diagnosis not present

## 2020-02-26 DIAGNOSIS — H26493 Other secondary cataract, bilateral: Secondary | ICD-10-CM | POA: Diagnosis not present

## 2020-02-26 DIAGNOSIS — H353132 Nonexudative age-related macular degeneration, bilateral, intermediate dry stage: Secondary | ICD-10-CM | POA: Diagnosis not present

## 2020-02-26 DIAGNOSIS — Z961 Presence of intraocular lens: Secondary | ICD-10-CM | POA: Diagnosis not present

## 2020-04-12 DIAGNOSIS — I739 Peripheral vascular disease, unspecified: Secondary | ICD-10-CM | POA: Diagnosis not present

## 2020-04-12 DIAGNOSIS — B351 Tinea unguium: Secondary | ICD-10-CM | POA: Diagnosis not present

## 2020-04-15 NOTE — Progress Notes (Signed)
[facility] [address] [place] Phone: [phone] Subjective:   I Jamie [name] am serving as a Education administrator for Dr. Hulan Saas.  This visit occurred during the SARS-CoV-2 public health emergency.  Safety protocols were in place, including screening questions prior to the visit, additional usage of staff PPE, and extensive cleaning of exam room while observing appropriate contact time as indicated for disinfecting solutions.   I'm seeing this patient by the request  of:  Lorrene Reid, PA-C  CC: Knee pain follow-up  [id number]   02/10/2020 Right knee pain today.  Chronic problem with exacerbation.  Discussed icing regimen and home exercises.  Patient wants to hold on anything else.  Patient's underlying problems including the Parkinson's likely contributing.  Follow-up again in 10 to 12 weeks  Update 04/20/2020 Jamie [name] is a 86 y.o. female coming in with complaint of right knee pain. Patient states she has had pain the past week. Injection today.  Patient is having worsening pain again.  Feels like it had been doing very well until the recent weeks.  Patient is having pain and feels like even swelling.  Some increase in instability.  Patient is accompanied with family member     Past Medical History:  Diagnosis Date  . Hypertension    only one time  . Right knee pain   . Spinal stenosis of lumbar region    Past Surgical History:  Procedure Laterality Date  . HYSTERECTOMY ABDOMINAL WITH SALPINGECTOMY    . TONSILLECTOMY     Social History   Socioeconomic History  . Marital status: Widowed    Spouse name: Not on file  . Number of children: 3  . Years of education: 79  . Highest education level: High school graduate  Occupational History  . Not on file  Tobacco Use  . Smoking status: Never Smoker  . Smokeless tobacco: Never Used  Vaping Use  . Vaping Use: Never used  Substance and Sexual Activity  . Alcohol use:  No    Alcohol/week: 0.0 standard drinks  . Drug use: No  . Sexual activity: Not Currently  Other Topics Concern  . Not on file  Social History Narrative   Pt lives alone in her 1 story home- she has 3 sons, and a high school graduate. She is right handed, she drinks coffee, and one soda a week mostly water   Social Determinants of Health   Financial Resource Strain: Not on file  Food Insecurity: Not on file  Transportation Needs: Not on file  Physical Activity: Not on file  Stress: Not on file  Social Connections: Not on file   Allergies  Allergen Reactions  . Flagyl [Metronidazole] Rash    Possible cause of rash    Family History  Problem Relation Age of Onset  . Breast cancer Mother   . Melanoma Father   . Healthy Son      Current Outpatient Medications (Cardiovascular):  .  rosuvastatin (CRESTOR) 5 MG tablet, TAKE 1 TABLET BY MOUTH AT BEDTIME  Current Outpatient Medications (Respiratory):  .  saline (AYR) GEL, Place 1 application into both nostrils at bedtime.   Current Outpatient Medications (Hematological):  Marland Kitchen  Cyanocobalamin (VITAMIN B 12 PO), Take by mouth. 5, 000 mcg daily  Current Outpatient Medications (Other):  .  Biotin 10000 MCG TABS, Take by mouth. 1/2 tablet daily .  carbidopa-levodopa (SINEMET IR) 25-100 MG tablet, TAKE 1 AND 1/2 TABLETS BY MOUTH 3  TIMES DAILY. Marland Kitchen  Coenzyme Q10 (COQ-10) 100 MG CAPS, Take 1 tablet by mouth daily. Marland Kitchen  escitalopram (LEXAPRO) 20 MG tablet, Take 1 tablet (20 mg total) by mouth daily. .  Glucosamine-Chondroit-Vit C-Mn (GLUCOSAMINE 1500 COMPLEX PO), Take 1 tablet by mouth daily.  .  Multiple Vitamin (MULTIVITAMIN WITH MINERALS) TABS tablet, Take 1 tablet by mouth daily. .  Multiple Vitamins-Minerals (PRESERVISION AREDS 2 PO), Take 1 tablet by mouth in the morning and at bedtime.    Reviewed prior external information including notes and imaging from  primary care provider As well as notes that were available from care  everywhere and other healthcare systems.  Past medical history, social, surgical and family history all reviewed in electronic medical record.  No pertanent information unless stated regarding to the chief complaint.   Review of Systems:  No headache, visual changes, nausea, vomiting, diarrhea, constipation, dizziness, abdominal pain, skin rash, fevers, chills, night sweats, weight loss, swollen lymph nodes,  chest pain, shortness of breath, mood changes. POSITIVE muscle aches, body aches, joint swelling  Objective  Blood pressure 130/78, pulse 72, height 5\' 6"  (1.676 m), weight 167 lb (75.8 kg), SpO2 98 %.   General: No apparent distress alert and oriented x3 mood and affect normal, dressed appropriately.  Mild masked facies noted that seems to be patient's baseline. HEENT: Pupils equal, extraocular movements intact  Respiratory: Patient's speak in full sentences and does not appear short of breath  Cardiovascular: No lower extremity edema, non tender, no erythema  Gait shuffling gait noted. MSK: Knee: Right valgus deformity noted.  Abnormal thigh to calf ratio.  Tender to palpation over medial and PF joint line.  ROM full in flexion and extension but does have hypertonicity noted instability with valgus force.  painful patellar compression. Patellar glide with moderate crepitus. Patellar and quadriceps tendons unremarkable. Hamstring and quadriceps strength is normal. Contralateral knee shows arthritic changes as well and hypertonicity of muscles noted.  No significant instability of the contralateral knee  After informed written and verbal consent, patient was seated on exam table. Right knee was prepped with alcohol swab and utilizing anterolateral approach, patient's right knee space was injected with 4:1  marcaine 0.5%: Kenalog 40mg /dL. Patient tolerated the procedure well without immediate complications.   Impression and Recommendations:     The above documentation has been  reviewed and is accurate and complete Lyndal Pulley, DO

## 2020-04-20 ENCOUNTER — Ambulatory Visit: Payer: Medicare PPO | Admitting: Family Medicine

## 2020-04-20 ENCOUNTER — Other Ambulatory Visit: Payer: Self-pay

## 2020-04-20 ENCOUNTER — Encounter: Payer: Self-pay | Admitting: Family Medicine

## 2020-04-20 DIAGNOSIS — M1711 Unilateral primary osteoarthritis, right knee: Secondary | ICD-10-CM | POA: Diagnosis not present

## 2020-04-20 NOTE — Assessment & Plan Note (Addendum)
Patient given injection today.  Does have a known moderate arthritic changes.  Does have Parkinson's that is likely contributing as well.  We discussed with patient not the possibility of viscosupplementation MRI get approval.  Patient wants to avoid any type of surgical intervention.  Patient is in the 8th decade of her life and does have comorbidities that would make patient having difficulty with rehabilitation anyhow.  Patient will follow up with me again in 6 to 8 weeks for viscosupplementation likely.  We will get x-rays today to further evaluate with the last ones being in 86 years old.

## 2020-04-20 NOTE — Patient Instructions (Addendum)
Good to see you We will get you approved for the gel See me again in 6-8 weeks for potential gel injeciton

## 2020-05-10 ENCOUNTER — Telehealth: Payer: Self-pay | Admitting: Family Medicine

## 2020-05-10 NOTE — Telephone Encounter (Signed)
Patient's grand-daughter called stating that the patient is still having pain in her knee after the last injection. She is scheduled for a Monovisc injection on 06/08/2020 but it worried about waiting that long. Please advise.

## 2020-05-10 NOTE — Telephone Encounter (Signed)
Patient scheduled on 4/5 but will call them if something opens up sooner.

## 2020-05-11 ENCOUNTER — Telehealth: Payer: Self-pay | Admitting: Neurology

## 2020-05-11 NOTE — Telephone Encounter (Signed)
She is on max dose of lexapro as I doubled it last visit (should be on 20 mg daily). I could change that to mirtazapine if they want but may be a good idea to f/u with PCP first?

## 2020-05-11 NOTE — Telephone Encounter (Signed)
Patient's daughter in law called in wanting to see about increasing the patient's Lexapro. They have noticed her feeling down towards the afternoon for a while now and her significant other also recently passed away.

## 2020-05-11 NOTE — Telephone Encounter (Signed)
Spoke with patients daughter in law and made her aware of Dr Doristine Devoid recommendations. Informed her that Dr Tat does not treat anxiety. She voiced understanding and states they will leave the Lexapro alone and reach out to the patients pcp.

## 2020-05-13 NOTE — Progress Notes (Signed)
[place] Phone: [phone] Subjective:   Jamie [name], am serving as a scribe for Dr. Hulan [name]. This visit occurred during the SARS-CoV-2 public health emergency.  Safety protocols were in place, including screening questions prior to the visit, additional usage of staff PPE, and extensive cleaning of exam room while observing appropriate contact time as indicated for disinfecting solutions.  I'm seeing this patient by the request  of:  Jamie Reid, PA-C  CC: knee pain   [id number]   04/20/2020 Patient given injection today.  Does have a known moderate arthritic changes.  Does have Parkinson's that is likely contributing as well.  We discussed with patient not the possibility of viscosupplementation MRI get approval.  Patient wants to avoid any type of surgical intervention.  Patient is in the 8th decade of her life and does have comorbidities that would make patient having difficulty with rehabilitation anyhow.  Patient will follow up with me again in 6 to 8 weeks for viscosupplementation likely.  We will get x-rays today to further evaluate with the last ones being in 86 years old.  Update 05/18/2020 Jamie [name] is a 86 y.o. female coming in with complaint of right knee pain. Monovisc approved. Patient states that last injection did not help.   Onset-  Location Duration-  Character- Aggravating factors- Reliving factors-  Therapies tried-  Severity-     Past Medical History:  Diagnosis Date  . Hypertension    only one time  . Right knee pain   . Spinal stenosis of lumbar region    Past Surgical History:  Procedure Laterality Date  . HYSTERECTOMY ABDOMINAL WITH SALPINGECTOMY    . TONSILLECTOMY     Social History   Socioeconomic History  . Marital status: Widowed    Spouse name: Not on file  . Number of children: 3  . Years of education: 55  . Highest education level: High school  graduate  Occupational History  . Not on file  Tobacco Use  . Smoking status: Never Smoker  . Smokeless tobacco: Never Used  Vaping Use  . Vaping Use: Never used  Substance and Sexual Activity  . Alcohol use: [name]    Alcohol/week: 0.0 standard drinks  . Drug use: [name]  . Sexual activity: Not Currently  Other Topics Concern  . Not on file  Social History Narrative   Pt lives alone in her 1 story home- she has 3 sons, and a high school graduate. She is right handed, she drinks coffee, and one soda a week mostly water   Social Determinants of Health   Financial Resource Strain: Not on file  Food Insecurity: Not on file  Transportation Needs: Not on file  Physical Activity: Not on file  Stress: Not on file  Social Connections: Not on file   Allergies  Allergen Reactions  . Flagyl [Metronidazole] Rash    Possible cause of rash    Family History  Problem Relation Age of Onset  . Breast cancer Mother   . Melanoma Father   . Healthy Son      Current Outpatient Medications (Cardiovascular):  .  rosuvastatin (CRESTOR) 5 MG tablet, TAKE 1 TABLET BY MOUTH AT BEDTIME  Current Outpatient Medications (Respiratory):  .  saline (AYR) GEL, Place 1 application into both nostrils at bedtime.   Current Outpatient Medications (Hematological):  Marland Kitchen  Cyanocobalamin (VITAMIN B 12 PO), Take by mouth. 5, 000 mcg daily  Current Outpatient Medications (Other):  .  Biotin 10000 MCG TABS, Take by mouth. 1/2 tablet daily .  carbidopa-levodopa (SINEMET IR) 25-100 MG tablet, TAKE 1 AND 1/2 TABLETS BY MOUTH 3 TIMES DAILY. Marland Kitchen  Coenzyme Q10 (COQ-10) 100 MG CAPS, Take 1 tablet by mouth daily. Marland Kitchen  escitalopram (LEXAPRO) 20 MG tablet, Take 1 tablet (20 mg total) by mouth daily. .  Glucosamine-Chondroit-Vit C-Mn (GLUCOSAMINE 1500 COMPLEX PO), Take 1 tablet by mouth daily.  .  Multiple Vitamin (MULTIVITAMIN WITH MINERALS) TABS tablet, Take 1 tablet by mouth daily. .  Multiple Vitamins-Minerals (PRESERVISION  AREDS 2 PO), Take 1 tablet by mouth in the morning and at bedtime.    Reviewed prior external information including notes and imaging from  primary care provider As well as notes that were available from care everywhere and other healthcare systems.  Past medical history, social, surgical and family history all reviewed in electronic medical record.  [name] pertanent information unless stated regarding to the chief complaint.   Review of Systems:  [name] headache, visual changes, nausea, vomiting, diarrhea, constipation, dizziness, abdominal pain, skin rash, fevers, chills, night sweats, weight loss, swollen lymph nodes, , joint swelling, chest pain, shortness of breath, mood changes. POSITIVE muscle aches, body aches  Objective  Blood pressure 128/88, pulse 66, height 5\' 6"  (1.676 m), weight 167 lb (75.8 kg), SpO2 97 %.   General: [name] apparent distress alert and oriented x3 mood and affect normal, dressed appropriately.  Mild masked facies HEENT: Pupils equal, extraocular movements intact  Respiratory: Patient's speak in full sentences and does not appear short of breath  Cardiovascular: [name] lower extremity edema, non tender, [name] erythema  Gait antalgic with slow shuffling gait MSK: Knee: Right valgus deformity noted.  Abnormal thigh to calf ratio.  Tender to palpation over medial and PF joint line.  ROM full in flexion and extension and lower leg rotation. instability with valgus force.  painful patellar compression. Patellar glide with moderate crepitus. Patellar and quadriceps tendons unremarkable. Hamstring and quadriceps strength is normal. Contralateral knee shows mild arthritic changes but [name] significant tenderness.  After informed written and verbal consent, patient was seated on exam table. Right knee was prepped with alcohol swab and utilizing anterolateral approach, patient's right knee space was injected with 48 mg per 3 mL of Monovisc (sodium hyaluronate) in a prefilled syringe was  injected easily into the knee through a 22-gauge needle..Patient tolerated the procedure well without immediate complications.    Impression and Recommendations:     The above documentation has been reviewed and is accurate and complete Lyndal Pulley, DO

## 2020-05-18 ENCOUNTER — Ambulatory Visit (INDEPENDENT_AMBULATORY_CARE_PROVIDER_SITE_OTHER): Payer: Medicare PPO

## 2020-05-18 ENCOUNTER — Ambulatory Visit (INDEPENDENT_AMBULATORY_CARE_PROVIDER_SITE_OTHER): Payer: Medicare PPO | Admitting: Family Medicine

## 2020-05-18 ENCOUNTER — Encounter: Payer: Self-pay | Admitting: Family Medicine

## 2020-05-18 ENCOUNTER — Other Ambulatory Visit: Payer: Self-pay

## 2020-05-18 VITALS — BP 128/88 | HR 66 | Ht 66.0 in | Wt 167.0 lb

## 2020-05-18 DIAGNOSIS — G8929 Other chronic pain: Secondary | ICD-10-CM | POA: Diagnosis not present

## 2020-05-18 DIAGNOSIS — M25561 Pain in right knee: Secondary | ICD-10-CM | POA: Diagnosis not present

## 2020-05-18 DIAGNOSIS — M1711 Unilateral primary osteoarthritis, right knee: Secondary | ICD-10-CM

## 2020-05-18 NOTE — Assessment & Plan Note (Signed)
Patient given viscosupplementation today.  Tolerated the procedure well.  Discussed icing regimen and home exercises.  Patient does have some hypertonicity noted.  Patient is continuing to stay fairly active.  Does also have the underlying spinal stenosis that can contribute.  Increase activity slowly.  Follow-up with me again in 8 weeks.

## 2020-05-18 NOTE — Patient Instructions (Addendum)
Gel injection today Xray on way out See me again in 2 months

## 2020-05-25 ENCOUNTER — Ambulatory Visit: Payer: Medicare PPO | Admitting: Family Medicine

## 2020-05-31 ENCOUNTER — Other Ambulatory Visit: Payer: Self-pay | Admitting: Physician Assistant

## 2020-06-08 ENCOUNTER — Ambulatory Visit: Payer: Medicare PPO | Admitting: Family Medicine

## 2020-06-15 NOTE — Progress Notes (Signed)
[place] Phone: [phone] Subjective:   [name], am serving as a scribe for Dr. Hulan Saas. This visit occurred during the SARS-CoV-2 public health emergency.  Safety protocols were in place, including screening questions prior to the visit, additional usage of staff PPE, and extensive cleaning of exam room while observing appropriate contact time as indicated for disinfecting solutions.   I'm seeing this patient by the request  of:  Lorrene Reid, PA-C  CC: Knee pain right side  [id number]   05/18/2020 Patient given viscosupplementation today.  Tolerated the procedure well.  Discussed icing regimen and home exercises.  Patient does have some hypertonicity noted.  Patient is continuing to stay fairly active.  Does also have the underlying spinal stenosis that can contribute.  Increase activity slowly.  Follow-up with me again in 8 weeks.  Update 06/16/2020 MAE DENUNZIO is a 86 y.o. female coming in with complaint of right knee pain. Patient states that the gel injection did not work as well as steroid.  Patient feels the steroid usually does better.  Describes the pain as a dull, throbbing aching pain.      Past Medical History:  Diagnosis Date  . Hypertension    only one time  . Right knee pain   . Spinal stenosis of lumbar region    Past Surgical History:  Procedure Laterality Date  . HYSTERECTOMY ABDOMINAL WITH SALPINGECTOMY    . TONSILLECTOMY     Social History   Socioeconomic History  . Marital status: Widowed    Spouse name: Not on file  . Number of children: 3  . Years of education: 57  . Highest education level: High school graduate  Occupational History  . Not on file  Tobacco Use  . Smoking status: Never Smoker  . Smokeless tobacco: Never Used  Vaping Use  . Vaping Use: Never used  Substance and Sexual Activity  . Alcohol use: No    Alcohol/week: 0.0 standard  drinks  . Drug use: No  . Sexual activity: Not Currently  Other Topics Concern  . Not on file  Social History Narrative   Pt lives alone in her 1 story home- she has 3 sons, and a high school graduate. She is right handed, she drinks coffee, and one soda a week mostly water   Social Determinants of Health   Financial Resource Strain: Not on file  Food Insecurity: Not on file  Transportation Needs: Not on file  Physical Activity: Not on file  Stress: Not on file  Social Connections: Not on file   Allergies  Allergen Reactions  . Flagyl [Metronidazole] Rash    Possible cause of rash    Family History  Problem Relation Age of Onset  . Breast cancer Mother   . Melanoma Father   . Healthy Son      Current Outpatient Medications (Cardiovascular):  .  rosuvastatin (CRESTOR) 5 MG tablet, TAKE 1 TABLET BY MOUTH AT BEDTIME  Current Outpatient Medications (Respiratory):  .  saline (AYR) GEL, Place 1 application into both nostrils at bedtime.   Current Outpatient Medications (Hematological):  Marland Kitchen  Cyanocobalamin (VITAMIN B 12 PO), Take by mouth. 5, 000 mcg daily  Current Outpatient Medications (Other):  .  Biotin 10000 MCG TABS, Take by mouth. 1/2 tablet daily .  carbidopa-levodopa (SINEMET IR) 25-100 MG tablet, TAKE 1 AND 1/2 TABLETS BY MOUTH 3 TIMES DAILY. Marland Kitchen  Coenzyme Q10 (COQ-10) 100  MG CAPS, Take 1 tablet by mouth daily. Marland Kitchen  escitalopram (LEXAPRO) 20 MG tablet, Take 1 tablet (20 mg total) by mouth daily. .  Glucosamine-Chondroit-Vit C-Mn (GLUCOSAMINE 1500 COMPLEX PO), Take 1 tablet by mouth daily.  .  Multiple Vitamin (MULTIVITAMIN WITH MINERALS) TABS tablet, Take 1 tablet by mouth daily. .  Multiple Vitamins-Minerals (PRESERVISION AREDS 2 PO), Take 1 tablet by mouth in the morning and at bedtime.    Reviewed prior external information including notes and imaging from  primary care provider As well as notes that were available from care everywhere and other healthcare  systems.  Past medical history, social, surgical and family history all reviewed in electronic medical record.  No pertanent information unless stated regarding to the chief complaint.   Review of Systems:  No headache, visual changes, nausea, vomiting, diarrhea, constipation, dizziness, abdominal pain, skin rash, fevers, chills, night sweats, weight loss, swollen lymph nodes, body aches, chest pain, shortness of breath, mood changes. POSITIVE muscle aches, joint swelling  Objective  Blood pressure 116/78, pulse 75, height 5\' 6"  (1.676 m), weight 166 lb (75.3 kg), SpO2 99 %.   General: No apparent distress alert and oriented x3 mood and affect normal, dressed appropriately.  HEENT: Pupils equal, extraocular movements intact  Respiratory: Patient's speak in full sentences and does not appear short of breath  Cardiovascular: No lower extremity edema, non tender, no erythema  Gait antalgic with mild shuffling gait MSK:   Patient's right knee does have instability noted.  Tender to palpation with valgus and varus force.  Patient does have some hypertonicity noted of multiple joints including her lower extremities.  Neurovascularly intact distally though.   After informed written and verbal consent, patient was seated on exam table. Right knee was prepped with alcohol swab and utilizing anterolateral approach, patient's right knee space was injected with 4:1  marcaine 0.5%: Kenalog 40mg /dL. Patient tolerated the procedure well without immediate complications. Impression and Recommendations:     The above documentation has been reviewed and is accurate and complete Lyndal Pulley, DO

## 2020-06-16 ENCOUNTER — Encounter: Payer: Self-pay | Admitting: Family Medicine

## 2020-06-16 ENCOUNTER — Ambulatory Visit: Payer: Medicare PPO | Admitting: Family Medicine

## 2020-06-16 ENCOUNTER — Other Ambulatory Visit: Payer: Self-pay

## 2020-06-16 DIAGNOSIS — M1711 Unilateral primary osteoarthritis, right knee: Secondary | ICD-10-CM | POA: Diagnosis not present

## 2020-06-16 NOTE — Patient Instructions (Signed)
Pennsaid 2x a day as needed See me again in 10 weeks

## 2020-06-16 NOTE — Assessment & Plan Note (Signed)
Chronic problem with exacerbation.  Discussed posture and ergonomics, discussed which activities to do which wants to avoid.  Increase activity slowly.  Discussed icing regimen.  Follow-up with me again in 10 weeks otherwise.  Patient wants to avoid a course of any type of surgical intervention.

## 2020-06-22 NOTE — Progress Notes (Signed)
Assessment/Plan:   1.  Parkinsons Disease/SWEDD  -she was slower today  -Increase carbidopa/levodopa 25/100, 2 tablets 3 times daily  -send PT to home.  Pt homebound and lives alone.    -discussed hydration  2.  Insomnia  -Has received benefit with melatonin, 2.5 mg nightly and will continue that  3.  Depression  -Continue Lexapro, 20 mg daily.  (If no help try mirtazapine as does have trouble with appetite)  -she declines counseling.  Significant other just passed away.  Encouraged her to reconsider.  She met with my LCSW today.  4.  Memory change  -potentially developing PDD  -she asked about prevagen.  I don't recommend  -will schedule neurocog testing   Subjective:   Jamie [name] was seen today in follow up for Parkinsons disease.  My previous records were reviewed prior to todays visit as well as outside records available to me.  Patient with daughter in law who supplements the history.  Pt denies falls.  Having some trouble moving and some with more loss of balance.   Pt denies lightheadedness, near syncope.  No hallucinations.  We increased her Lexapro last visit.  They called me in mid March to state that she was not doing as well, but also that her significant other had recently passed away.  I told them that I could change her medication to mirtazapine, but I also thought it would be a good idea for her to follow-up with her primary care, since there are other things going on (brief).  They ultimately decided to continue the Lexapro.  Today, they state that they haven't gotten to PCP yet but she has an appt.  Her appetite is still poor.  she tells me mood is good but her daughter would disagree.  She is forgetting medication more now that her partner passed away but not a lot (perhaps only 2 times per week).  Her daughter does note that despite putting reminders all over the house, the patient may get up and get ready for church even though it is not a Sunday.  Current  prescribed movement disorder medications: Carbidopa/levodopa 25/100, 1.5 tablets 3 times daily Melatonin, 3 mg daily Lexapro, 20 mg daily (increased last visit)     ALLERGIES:   Allergies  Allergen Reactions  . Flagyl [Metronidazole] Rash    Possible cause of rash     CURRENT MEDICATIONS:  Outpatient Encounter Medications as of 06/23/2020  Medication Sig  . carbidopa-levodopa (SINEMET IR) 25-100 MG tablet TAKE 1 AND 1/2 TABLETS BY MOUTH 3 TIMES DAILY.  Marland Kitchen escitalopram (LEXAPRO) 20 MG tablet Take 1 tablet (20 mg total) by mouth daily.  . Multiple Vitamin (MULTIVITAMIN WITH MINERALS) TABS tablet Take 1 tablet by mouth daily.  . Multiple Vitamins-Minerals (PRESERVISION AREDS 2 PO) Take 1 tablet by mouth in the morning and at bedtime.   . NON FORMULARY Herblax as needed  . NON FORMULARY Glucose Reduction  . NON FORMULARY Cholesterol Reduction  . rosuvastatin (CRESTOR) 5 MG tablet TAKE 1 TABLET BY MOUTH AT BEDTIME  . Vitamin E 100 units TABS Take 250 mg by mouth. Once a day  . Biotin 10000 MCG TABS Take by mouth. 1/2 tablet daily (Patient not taking: Reported on 06/23/2020)  . Coenzyme Q10 (COQ-10) 100 MG CAPS Take 1 tablet by mouth daily. (Patient not taking: Reported on 06/23/2020)  . Cyanocobalamin (VITAMIN B 12 PO) Take by mouth. 5, 000 mcg daily (Patient not taking: Reported on 06/23/2020)  .  Glucosamine-Chondroit-Vit C-Mn (GLUCOSAMINE 1500 COMPLEX PO) Take 1 tablet by mouth daily.  (Patient not taking: Reported on 06/23/2020)  . saline (AYR) GEL Place 1 application into both nostrils at bedtime. (Patient not taking: Reported on 06/23/2020)   No facility-administered encounter medications on file as of 06/23/2020.    Objective:   PHYSICAL EXAMINATION:    VITALS:   Vitals:   06/23/20 0937  BP: 124/82  Pulse: 71  SpO2: 96%  Weight: 162 lb (73.5 kg)  Height: $Remove'5\' 6"'[id number]$  (1.676 m)    GEN:  The patient appears stated age and is in NAD. HEENT:  Normocephalic, atraumatic.  The mucous membranes  are moist. The superficial temporal arteries are without ropiness or tenderness. CV:  RRR Lungs:  CTAB Neck/HEME:  There are no carotid bruits bilaterally.  Neurological examination:  Orientation: The patient is alert and oriented x3. Cranial nerves: There is good facial symmetry with significant facial hypomimia. The speech is fluent and clear. Soft palate rises symmetrically and there is no tongue deviation. Hearing is intact to conversational tone. Sensation: Sensation is intact to light touch throughout Motor: Strength is at least antigravity x4.  Movement examination: Tone: There is mild increased tone in the bilateral upper extremity Abnormal movements: None Coordination:  There is significant slowness of all rapid alternating movements, especially with finger taps bilaterally Gait and Station: The patient pushes off to arise.  She is slow to ambulate.  She is somewhat unbalanced.  I have reviewed and interpreted the following labs independently    Chemistry      Component Value Date/Time   NA 139 12/05/2019 0842   K 4.2 12/05/2019 0842   CL 105 12/05/2019 0842   CO2 24 12/05/2019 0842   BUN 15 12/05/2019 0842   CREATININE 0.75 12/05/2019 0842   GLU 98 05/30/2017 0000      Component Value Date/Time   CALCIUM 10.2 12/05/2019 0842   ALKPHOS 88 12/05/2019 0842   AST 19 12/05/2019 0842   ALT 6 12/05/2019 0842   BILITOT 0.5 12/05/2019 0842       Lab Results  Component Value Date   WBC 6.8 12/05/2019   HGB 13.5 12/05/2019   HCT 41.5 12/05/2019   MCV 94 12/05/2019   PLT 202 12/05/2019    Lab Results  Component Value Date   TSH 2.990 10/01/2019     Total time spent on today's visit was 41 minutes, including both face-to-face time and nonface-to-face time.  Time included that spent on review of records (prior notes available to me/labs/imaging if pertinent), discussing treatment and goals, answering patient's questions and coordinating care.  Cc:  Lorrene Reid, PA-C

## 2020-06-23 ENCOUNTER — Other Ambulatory Visit: Payer: Self-pay

## 2020-06-23 ENCOUNTER — Encounter: Payer: Self-pay | Admitting: Neurology

## 2020-06-23 ENCOUNTER — Ambulatory Visit: Payer: Medicare PPO | Admitting: Neurology

## 2020-06-23 VITALS — BP 124/82 | HR 71 | Ht 66.0 in | Wt 162.0 lb

## 2020-06-23 DIAGNOSIS — G20 Parkinson's disease: Secondary | ICD-10-CM

## 2020-06-23 DIAGNOSIS — R413 Other amnesia: Secondary | ICD-10-CM | POA: Diagnosis not present

## 2020-06-23 MED ORDER — CARBIDOPA-LEVODOPA 25-100 MG PO TABS: 2.0000 | ORAL_TABLET | Freq: Three times a day (TID) | ORAL | 1 refills | Status: DC

## 2020-06-23 NOTE — Patient Instructions (Addendum)
1.  Increase carbidopa/levodopa 25/100, 2 tablets three tablets per day  2.  You have been referred for a neurocognitive evaluation (i.e., evaluation of memory and thinking abilities). Please bring someone with you to this appointment if possible, as it is helpful for the neuropsychologist to hear from both you and another adult who knows you well. Please bring eyeglasses and hearing aids if you wear them.    The evaluation will take approximately 3 hours and has two parts:   . The first part is a clinical interview with the neuropsychologist, Dr. Melvyn Novas or Dr. Nicole Kindred. During the interview, the neuropsychologist will speak with you and the individual you brought to the appointment.    . The second part of the evaluation is testing with the doctor's technician, aka psychometrician, Hinton Dyer or [employer]. During the testing, the technician will ask you to remember different types of material, solve problems, and answer some questionnaires. Your family member will not be present for this portion of the evaluation.   Please note: We have to reserve several hours of the neuropsychologist's time and the psychometrician's time for your evaluation appointment. As such, there is a No-Show fee of $100. If you are unable to attend any of your appointments, please contact our office as soon as possible to reschedule.    Online Resources for Power over Parkinson's Group April 2022  . Local [place] Online Groups  o Power over [employer] Group :   - Power Over Parkinson's Patient Education Group will be Wednesday, April 13th at 2pm via [place].   - Upcoming Power over Parkinson's Meetings:  2nd Wednesdays of the month at 2 pm:  May 11th, June 8th - Contact [name] Marriott at [email or web address] if interested in participating in this online group o Parkinson's Care Partners Group:    3rd Mondays, Contact Misty Palladino o Atypical Parkinsonian Patient Group:   4th Wednesdays, Contact Misty Palladino o If you are  interested in participating in these online groups with Misty, please contact her directly for how to join those meetings.  Her contact information is [email or web address]  She will send you a link to join the [employer].    . [place]:  www.[email or web address] o PD Health at Home continues:  Mindfulness Mondays, Expert Briefing Tuesdays, Wellness Wednesdays, Take Time Thursdays, Fitness Fridays -Listings for March 2022 are on the website o Upcoming Webinar:  Can We Put the Brakes on PD Progression?  Wednesday, April 6th @ 1 pm o Geneticist, molecular) at [email or web address] o  Please check out their website to sign up for emails and see their full online offerings  . [place]:  [email or web address]  o Upcoming Webinar:   New to Parkinson's?  Steps to Take Today.  Thursday, March 21st @ 12 noon o Check out additional information on their website to see their full online offerings  . [place]:  [email or web address] o Upcoming Webinar:  Stay tuned o Care Partner Monthly Meetup.  With [place].  First Tuesday of each month, 2 pm o Check out additional information to Live Well Today on their website  . Parkinson and Movement Disorders (PMD) Alliance:  www.[email or web address].org o [employer] Online:  Online Education Events o Sign up for emails, which are sent weekly to give you updates on programming and online offerings     . Parkinson's Association of the [place]:  www.[email or web address] o Information on online support groups, education events, and online exercises including Yoga, Parkinson's exercises and more-LOTS of information  on links to PD resources and online events o Virtual Support Group through Parkinson's Association of the [place]; next one is scheduled for Wednesday, May 26, 2020 at 2 pm. (These are typically scheduled for the 1st Wednesday of the month at 2 pm).  Visit  website for details.  . Additional links for movement activities: o PWR! Moves Classes at [place] RESUMED!  Wednesdays 10 and 11 am.  Contact [name] Marriott, PT [email or web address] or [phone] if interested o Here is a link to the PWR!Moves classes on Zoom from [place] - Daily Mon-Sat at 10:00. Via Zoom, FREE and open to all.  There is also a link below via Facebook if you use that platform. - [email or web address].si - [email or web address].no o Parkinson's Wellness Recovery (PWR! Moves)  www.[email or web address] - Info on the PWR! Virtual Experience:  You will have access to our expertise through self-assessment, guided plans that start with the PD-specific fundamentals, educational content, tips, Q&A with an expert, and a growing Art therapist of PD-specific pre-recorded and live exercise classes of varying types and intensity - both physical and cognitive! If that is not enough, we offer 1:1 wellness consultations (in-person or virtual) to personalize your PWR! Research scientist (medical).  - Check out the PWR! Move of the month on the [place] [facility] website:  [email or web address] o [employer] Fridays:  - As part of the PD Health @ Home program, this free video series focuses each week on one aspect of fitness designed to support people living with Parkinson's.  These weekly videos highlight the [place] recent fitness guidelines for people with Parkinson's disease. -  [email or web address].dk o Dance for PD website is offering free, live-stream classes throughout the week, as well as links to [employer] of classes:  [email or web address] o Dance  for Parkinson's Class:  [place].  Free offering for people with Parkinson's and care partners; virtual class.  o For more information, contact [phone] or email Ruffin Frederick at [email or web address] o Virtual dance and Pilates for Parkinson's classes: Click on the Community Tab> Parkinson's Movement Initiative Tab.  To register for classes and for more information, visit www.[email or web address] and click the "community" tab.     o [employer] Parkinson's Cycling Classes  - [facility]: 1pm on Fridays-Live classes at [facility] [employer] at [email or web address] or [phone]) [facility]: Virtual Classes Mondays and Thursdays (contact Dietrich at [email or web address] or [phone])  o [place] levels of classes are offered Tuesdays and Thursdays:  10:30 am,  12 noon & 1:45 pm at [facility].  - Active Stretching with Paula Compton Class starting in March, on Fridays - To observe a class or for  more information, call [phone] or email [email or web address] . Well[employer]: o Chief Technology Officer Opportunities:  www.well-springsolutions.org/caregiver-education/caregiver-support-group.  You may also contact Vickki Muff at [email or web address] or [phone].   o Well-Spring Navigator:  Just1Navigator program, a free service to help individuals and families through the journey of determining care for older adults.  The "Navigator" is a [phone], Education officer, museum, who will speak with a prospective client and/or loved ones to provide an assessment of the situation and a set of recommendations for a personalized care plan -- all free of charge, and whether Well[employer] offers the needed service or not. If the need is not a service we provide, we are well-connected with reputable programs in town that we can refer you to.  www.well-springsolutions.org  or to speak with the  Navigator, call [phone].

## 2020-06-28 ENCOUNTER — Telehealth: Payer: Self-pay | Admitting: Physician Assistant

## 2020-06-28 ENCOUNTER — Encounter: Payer: Self-pay | Admitting: Physician Assistant

## 2020-06-28 ENCOUNTER — Ambulatory Visit: Payer: Medicare PPO | Admitting: Physician Assistant

## 2020-06-28 ENCOUNTER — Other Ambulatory Visit: Payer: Self-pay

## 2020-06-28 VITALS — BP 110/69 | HR 75 | Temp 98.3°F | Ht 66.0 in | Wt 163.4 lb

## 2020-06-28 DIAGNOSIS — F330 Major depressive disorder, recurrent, mild: Secondary | ICD-10-CM

## 2020-06-28 MED ORDER — ESCITALOPRAM OXALATE 20 MG PO TABS: 20.0000 mg | ORAL_TABLET | Freq: Every day | ORAL | 0 refills | Status: DC

## 2020-06-28 MED ORDER — ESCITALOPRAM OXALATE 10 MG PO TABS: 10.0000 mg | ORAL_TABLET | Freq: Every day | ORAL | 0 refills | Status: DC

## 2020-06-28 NOTE — Patient Instructions (Signed)
[email or web address]">  Depression Screening Depression screening is a tool that your health care provider can use to learn if you have symptoms of depression. Depression is a common condition with many symptoms that are also often found in other conditions. Depression is treatable, but it must first be diagnosed. You may not know that certain feelings, thoughts, and behaviors that you are having can be symptoms of depression. Taking a depression screening test can help you and your health care provider decide if you need more assessment, or if you should be referred to a mental health care provider. What are the screening tests?  You may have a physical exam to see if another condition is affecting your mental health. You may have a blood or urine sample taken during the physical exam.  You may be interviewed using a screening tool that was developed from research, such as one of these: ? Patient Health Questionnaire (PHQ). This is a set of either 2 or 9 questions. A health care provider who has been trained to score this screening test uses a guide to assess if your symptoms suggest that you may have depression. ? Hamilton Depression Rating Scale (HAM-D). This is a set of either 17 or 24 questions. You may be asked to take it again during or after your treatment, to see if your depression has gotten better. ? Beck Depression Inventory (BDI). This is a set of 21 multiple choice questions. Your health care provider scores your answers to assess:  Your level of depression, ranging from mild to severe.  Your response to treatment.  Your health care provider may talk with you about your daily activities, such as eating, sleeping, work, and recreation, and ask if you have had any changes in activity.  Your health care provider may ask you to see a mental health specialist, such as a psychiatrist or psychologist, for more  evaluation. Who should be screened for depression?  All adults, including adults with a family history of a mental health disorder.  Adolescents who are 89-77 years old.  People who are recovering from a myocardial infarction (MI).  Pregnant women, or women who have given birth.  People who have a long-term (chronic) illness.  Anyone who has been diagnosed with another type of a mental health disorder.  Anyone who has symptoms that could show depression.   What do my results mean? Your health care provider will review the results of your depression screening, physical exam, and lab tests. Positive screens suggest that you may have depression. Screening is the first step in getting the care that you may need. It is up to you to get your screening results. Ask your health care provider, or the department that is doing your screening tests, when your results will be ready. Talk with your health care provider about your results and diagnosis. A diagnosis of depression is made using the Diagnostic and Statistical Manual of Mental Disorders (DSM-V). This is a book that lists the number and type of symptoms that must be present for a health care provider to give a specific diagnosis.  Your health care provider may work with you to treat your symptoms of depression, or your health care provider may help you find a mental health provider who can assess, diagnose, and treat your depression. Get help right away if:  You have thoughts about hurting yourself or others. If you ever feel like you may hurt yourself or others, or have thoughts about taking your own life, get help  right away. You can go to your nearest emergency department or call:  Your local emergency services (911 in the [place]  A suicide crisis helpline, such as the [place] at (779)432-9194. This is open 24 hours a day. Summary  Depression screening is the first step in getting the help that you may  need.  If your screening test shows symptoms of depression (is positive), your health care provider may ask you to see a mental health provider.  Anyone who is age 33 or older should be screened for depression. This information is not intended to replace advice given to you by your health care provider. Make sure you discuss any questions you have with your health care provider. Document Revised: 07/31/2019 Document Reviewed: 07/31/2019 Elsevier Patient Education  [place].

## 2020-06-28 NOTE — Telephone Encounter (Signed)
Spoke with Colletta [place] and advised patient to take 30mg  Lexapro. AS, CMA

## 2020-06-28 NOTE — Telephone Encounter (Signed)
[facility] called to ask about two prescriptions (same med) Patient has been on escitalopram and pharmacy called and states two prescriptions were sent over, one for 10 mg and one for 20 mg and she just wants to confirm it is for a total of 30 mg a day. Please advise, thanks.

## 2020-06-28 NOTE — Progress Notes (Signed)
Established Patient Office Visit  Subjective:  Patient ID: Jamie [name], female    DOB: 08/09/1934  Age: 86 y.o. MRN: [id number]  CC:  Chief Complaint  Patient presents with  . Depression    HPI Jamie [name] presents for follow up to discuss mood management. Patient is accompanied by her daughter. Patient was started on Lexapro 10 mg by Neurologist when dx with PD. Dose was increased to 20 mg last year and has been on that dose for about a year. Patient reports mood changes where she has more bad days than good days. Patient's daughter states she lives alone and on the weekends family will go visit which usually helps improve her mood. Patient's significant other recently passed away.   Past Medical History:  Diagnosis Date  . Hypertension    only one time  . Right knee pain   . Spinal stenosis of lumbar region     Past Surgical History:  Procedure Laterality Date  . HYSTERECTOMY ABDOMINAL WITH SALPINGECTOMY    . TONSILLECTOMY      Family History  Problem Relation Age of Onset  . Breast cancer Mother   . Melanoma Father   . Healthy Son     Social History   Socioeconomic History  . Marital status: Widowed    Spouse name: Not on file  . Number of children: 3  . Years of education: 37  . Highest education level: High school graduate  Occupational History  . Not on file  Tobacco Use  . Smoking status: Never Smoker  . Smokeless tobacco: Never Used  Vaping Use  . Vaping Use: Never used  Substance and Sexual Activity  . Alcohol use: No    Alcohol/week: 0.0 standard drinks  . Drug use: No  . Sexual activity: Not Currently  Other Topics Concern  . Not on file  Social History Narrative   Pt lives alone in her 1 story home- she has 3 sons, and a high school graduate. She is right handed, she drinks coffee, and one soda a week mostly water   Social Determinants of Health   Financial Resource Strain: Not on file  Food Insecurity: Not on file  Transportation  Needs: Not on file  Physical Activity: Not on file  Stress: Not on file  Social Connections: Not on file  Intimate Partner Violence: Not on file    Outpatient Medications Prior to Visit  Medication Sig Dispense Refill  . Biotin 10000 MCG TABS Take by mouth. 1/2 tablet daily    . carbidopa-levodopa (SINEMET IR) 25-100 MG tablet Take 2 tablets by mouth 3 (three) times daily. 540 tablet 1  . Coenzyme Q10 (COQ-10) 100 MG CAPS Take 1 tablet by mouth daily.    . Cyanocobalamin (VITAMIN B 12 PO) Take by mouth. 5, 000 mcg daily    . Glucosamine-Chondroit-Vit C-Mn (GLUCOSAMINE 1500 COMPLEX PO) Take 1 tablet by mouth daily.    . Multiple Vitamin (MULTIVITAMIN WITH MINERALS) TABS tablet Take 1 tablet by mouth daily.    . Multiple Vitamins-Minerals (PRESERVISION AREDS 2 PO) Take 1 tablet by mouth in the morning and at bedtime.     . NON FORMULARY Herblax as needed    . NON FORMULARY Glucose Reduction    . NON FORMULARY Cholesterol Reduction    . rosuvastatin (CRESTOR) 5 MG tablet TAKE 1 TABLET BY MOUTH AT BEDTIME 90 tablet 0  . saline (AYR) GEL Place 1 application into both nostrils at bedtime. 14.1 g 0  .  Vitamin E 100 units TABS Take 250 mg by mouth. Once a day    . escitalopram (LEXAPRO) 20 MG tablet Take 1 tablet (20 mg total) by mouth daily. 90 tablet 1   No facility-administered medications prior to visit.    Allergies  Allergen Reactions  . Flagyl [Metronidazole] Rash    Possible cause of rash     ROS Review of Systems A fourteen system review of systems was performed and found to be positive as per HPI.   Objective:    Physical Exam General:  Well Developed, well nourished, in no acute distress  Neuro:  Alert and oriented,  extra-ocular muscles intact  HEENT:  Normocephalic, atraumatic, neck supple Skin:  no gross rash, warm, pink. Cardiac:  RRR, S1 S2 wnl's  Respiratory:  ECTA B/L w/o wheezing, crackles or rales, Not using accessory muscles, speaking in full sentences-  unlabored. Vascular:  Ext warm, no cyanosis apprec.; cap RF less 2 sec. Psych:  No HI/SI, judgement and insight good, dysthymic mood. Full Affect.  BP 110/69   Pulse 75   Temp 98.3 F (36.8 C)   Ht 5\' 6"  (1.676 m)   Wt 163 lb 6.4 oz (74.1 kg)   SpO2 97%   BMI 26.37 kg/m  Wt Readings from Last 3 Encounters:  06/28/20 163 lb 6.4 oz (74.1 kg)  06/23/20 162 lb (73.5 kg)  06/16/20 166 lb (75.3 kg)     Health Maintenance Due  Topic Date Due  . COVID-19 Vaccine (1) Never done  . DEXA SCAN  Never done    There are no preventive care reminders to display for this patient.  Lab Results  Component Value Date   TSH 2.990 10/01/2019   Lab Results  Component Value Date   WBC 6.8 12/05/2019   HGB 13.5 12/05/2019   HCT 41.5 12/05/2019   MCV 94 12/05/2019   PLT 202 12/05/2019   Lab Results  Component Value Date   NA 139 12/05/2019   K 4.2 12/05/2019   CO2 24 12/05/2019   GLUCOSE 112 (H) 12/05/2019   BUN 15 12/05/2019   CREATININE 0.75 12/05/2019   BILITOT 0.5 12/05/2019   ALKPHOS 88 12/05/2019   AST 19 12/05/2019   ALT 6 12/05/2019   PROT 7.3 12/05/2019   ALBUMIN 4.9 (H) 12/05/2019   CALCIUM 10.2 12/05/2019   ANIONGAP 7 08/15/2016   Lab Results  Component Value Date   CHOL 197 12/05/2019   Lab Results  Component Value Date   HDL 80 12/05/2019   Lab Results  Component Value Date   LDLCALC 97 12/05/2019   Lab Results  Component Value Date   TRIG 114 12/05/2019   Lab Results  Component Value Date   CHOLHDL 2.5 12/05/2019   Lab Results  Component Value Date   HGBA1C 6.2 (H) 12/05/2019      Assessment & Plan:   Problem List Items Addressed This Visit   None   Visit Diagnoses    Mild episode of recurrent major depressive disorder ([place])    -  Primary   Relevant Medications   escitalopram (LEXAPRO) 10 MG tablet   escitalopram (LEXAPRO) 20 MG tablet      Mild episode of recurrent major depressive disorder: -PHQ-9 score of 8, increased from prior  (score of 8).  Denies SI/HI.  -Discussed with patient management options and patient prefers to trial Lexapro 30 mg before considering changing antidepressant. Reviewed patient's EKG from 08/01/2016, QTC 395, no hx of cardiac conditions.  -  Encouraged to consider [facility] therapy. Patient has a good support system at home. -If symptoms fail to improve or worsen recommend changing antidepressant to mirtazapine 7.5 mg. -Follow-up in 4 weeks to reassess symptoms and medication therapy.  Depression screen [facility] 2/9 06/28/2020 12/15/2019 10/01/2019 09/11/2019 07/10/2019  Decreased Interest 2 0 0 0 0  Down, Depressed, Hopeless 2 0 0 0 0  PHQ - 2 Score 4 0 0 0 0  Altered sleeping 2 0 0 0 -  Tired, decreased energy 1 3 3 3  -  Change in appetite 1 3 3 1  -  Feeling bad or failure about yourself  0 0 0 0 -  Trouble concentrating 0 0 0 0 -  Moving slowly or fidgety/restless 0 0 0 1 -  Suicidal thoughts 0 0 0 0 -  PHQ-9 Score 8 6 6 5  -  Difficult doing work/chores Somewhat difficult Not difficult at all Not difficult at all Not difficult at all -  Some recent data might be hidden     Meds ordered this encounter  Medications  . escitalopram (LEXAPRO) 10 MG tablet    Sig: Take 1 tablet (10 mg total) by mouth daily.    Dispense:  30 tablet    Refill:  0    Order Specific Question:   Supervising Provider    Answer:   Beatrice Lecher D [[id number]]  . escitalopram (LEXAPRO) 20 MG tablet    Sig: Take 1 tablet (20 mg total) by mouth daily.    Dispense:  30 tablet    Refill:  0    Order Specific Question:   Supervising Provider    Answer:   Beatrice Lecher D [[id number]]    Follow-up: Return in about 4 weeks (around 07/26/2020) for [employer] med.   Note:  This note was prepared with assistance of Dragon voice recognition software. Occasional wrong-word or sound-a-like substitutions may have occurred due to the inherent limitations of voice recognition software.   Lorrene Reid, PA-C

## 2020-06-29 DIAGNOSIS — M48061 Spinal stenosis, lumbar region without neurogenic claudication: Secondary | ICD-10-CM | POA: Diagnosis not present

## 2020-06-29 DIAGNOSIS — G4700 Insomnia, unspecified: Secondary | ICD-10-CM | POA: Diagnosis not present

## 2020-06-29 DIAGNOSIS — M1711 Unilateral primary osteoarthritis, right knee: Secondary | ICD-10-CM | POA: Diagnosis not present

## 2020-06-29 DIAGNOSIS — I10 Essential (primary) hypertension: Secondary | ICD-10-CM | POA: Diagnosis not present

## 2020-06-29 DIAGNOSIS — F32A Depression, unspecified: Secondary | ICD-10-CM | POA: Diagnosis not present

## 2020-06-29 DIAGNOSIS — Z90710 Acquired absence of both cervix and uterus: Secondary | ICD-10-CM | POA: Diagnosis not present

## 2020-06-29 DIAGNOSIS — G20 Parkinson's disease: Secondary | ICD-10-CM | POA: Diagnosis not present

## 2020-07-05 DIAGNOSIS — G4700 Insomnia, unspecified: Secondary | ICD-10-CM | POA: Diagnosis not present

## 2020-07-05 DIAGNOSIS — M1711 Unilateral primary osteoarthritis, right knee: Secondary | ICD-10-CM | POA: Diagnosis not present

## 2020-07-05 DIAGNOSIS — M48061 Spinal stenosis, lumbar region without neurogenic claudication: Secondary | ICD-10-CM | POA: Diagnosis not present

## 2020-07-05 DIAGNOSIS — G20 Parkinson's disease: Secondary | ICD-10-CM | POA: Diagnosis not present

## 2020-07-05 DIAGNOSIS — I10 Essential (primary) hypertension: Secondary | ICD-10-CM | POA: Diagnosis not present

## 2020-07-05 DIAGNOSIS — F32A Depression, unspecified: Secondary | ICD-10-CM | POA: Diagnosis not present

## 2020-07-05 DIAGNOSIS — Z90710 Acquired absence of both cervix and uterus: Secondary | ICD-10-CM | POA: Diagnosis not present

## 2020-07-12 ENCOUNTER — Other Ambulatory Visit: Payer: Self-pay | Admitting: Neurology

## 2020-07-14 DIAGNOSIS — M48061 Spinal stenosis, lumbar region without neurogenic claudication: Secondary | ICD-10-CM | POA: Diagnosis not present

## 2020-07-14 DIAGNOSIS — M1711 Unilateral primary osteoarthritis, right knee: Secondary | ICD-10-CM | POA: Diagnosis not present

## 2020-07-14 DIAGNOSIS — G20 Parkinson's disease: Secondary | ICD-10-CM | POA: Diagnosis not present

## 2020-07-14 DIAGNOSIS — I10 Essential (primary) hypertension: Secondary | ICD-10-CM | POA: Diagnosis not present

## 2020-07-14 DIAGNOSIS — Z90710 Acquired absence of both cervix and uterus: Secondary | ICD-10-CM | POA: Diagnosis not present

## 2020-07-14 DIAGNOSIS — F32A Depression, unspecified: Secondary | ICD-10-CM | POA: Diagnosis not present

## 2020-07-14 DIAGNOSIS — G4700 Insomnia, unspecified: Secondary | ICD-10-CM | POA: Diagnosis not present

## 2020-07-16 ENCOUNTER — Telehealth: Payer: Self-pay | Admitting: Neurology

## 2020-07-16 NOTE — Telephone Encounter (Signed)
[place] drug is states that they have not gotten anything from [place] on a refill on the Carbidopa levodopa   Please call in medication

## 2020-07-20 ENCOUNTER — Ambulatory Visit: Payer: Medicare PPO | Admitting: Family Medicine

## 2020-07-20 MED ORDER — CARBIDOPA-LEVODOPA 25-100 MG PO TABS: 2.0000 | ORAL_TABLET | Freq: Three times a day (TID) | ORAL | 1 refills | Status: DC

## 2020-07-20 NOTE — Telephone Encounter (Signed)
[facility] and rx was not received. Patient last seen 06/23/20 and Carb/Levodopa was increased 2 tablets 3 times daily. Rx sent to pharmacy. Called patients daughter and informed her that Rx was sent.

## 2020-07-21 DIAGNOSIS — M1711 Unilateral primary osteoarthritis, right knee: Secondary | ICD-10-CM | POA: Diagnosis not present

## 2020-07-21 DIAGNOSIS — Z90710 Acquired absence of both cervix and uterus: Secondary | ICD-10-CM | POA: Diagnosis not present

## 2020-07-21 DIAGNOSIS — F32A Depression, unspecified: Secondary | ICD-10-CM | POA: Diagnosis not present

## 2020-07-21 DIAGNOSIS — G20 Parkinson's disease: Secondary | ICD-10-CM | POA: Diagnosis not present

## 2020-07-21 DIAGNOSIS — I10 Essential (primary) hypertension: Secondary | ICD-10-CM | POA: Diagnosis not present

## 2020-07-21 DIAGNOSIS — G4700 Insomnia, unspecified: Secondary | ICD-10-CM | POA: Diagnosis not present

## 2020-07-21 DIAGNOSIS — M48061 Spinal stenosis, lumbar region without neurogenic claudication: Secondary | ICD-10-CM | POA: Diagnosis not present

## 2020-07-23 ENCOUNTER — Other Ambulatory Visit: Payer: Self-pay | Admitting: Neurology

## 2020-07-26 ENCOUNTER — Ambulatory Visit: Payer: Medicare PPO | Admitting: Physician Assistant

## 2020-07-27 ENCOUNTER — Encounter: Payer: Self-pay | Admitting: Physician Assistant

## 2020-07-27 ENCOUNTER — Telehealth: Payer: Self-pay | Admitting: Neurology

## 2020-07-27 ENCOUNTER — Ambulatory Visit (INDEPENDENT_AMBULATORY_CARE_PROVIDER_SITE_OTHER): Payer: Medicare PPO | Admitting: Physician Assistant

## 2020-07-27 ENCOUNTER — Other Ambulatory Visit: Payer: Self-pay

## 2020-07-27 VITALS — BP 133/80 | HR 68 | Temp 97.3°F | Ht 64.0 in | Wt 160.3 lb

## 2020-07-27 DIAGNOSIS — E2839 Other primary ovarian failure: Secondary | ICD-10-CM

## 2020-07-27 DIAGNOSIS — Z0000 Encounter for general adult medical examination without abnormal findings: Secondary | ICD-10-CM | POA: Diagnosis not present

## 2020-07-27 DIAGNOSIS — Z8639 Personal history of other endocrine, nutritional and metabolic disease: Secondary | ICD-10-CM

## 2020-07-27 DIAGNOSIS — Z780 Asymptomatic menopausal state: Secondary | ICD-10-CM | POA: Diagnosis not present

## 2020-07-27 DIAGNOSIS — F330 Major depressive disorder, recurrent, mild: Secondary | ICD-10-CM | POA: Diagnosis not present

## 2020-07-27 MED ORDER — ESCITALOPRAM OXALATE 20 MG PO TABS: 20.0000 mg | ORAL_TABLET | Freq: Every day | ORAL | 1 refills | Status: DC

## 2020-07-27 MED ORDER — ESCITALOPRAM OXALATE 10 MG PO TABS: 10.0000 mg | ORAL_TABLET | Freq: Every day | ORAL | 1 refills | Status: DC

## 2020-07-27 MED ORDER — CARBIDOPA-LEVODOPA 25-100 MG PO TABS: 2.0000 | ORAL_TABLET | Freq: Three times a day (TID) | ORAL | 1 refills | Status: DC

## 2020-07-27 NOTE — Progress Notes (Signed)
Subjective:   Jamie [name] is a 86 y.o. female who presents for Medicare Annual (Subsequent) preventive examination.  Review of Systems    General:   No F/C, wt loss Pulm:   No DIB, SOB, pleuritic chest pain Card:  No CP, palpitations Abd:  No n/v/d or pain Ext:  No inc edema from baseline    Objective:    Today's Vitals   07/27/20 1134  BP: 133/80  Pulse: 68  Temp: (!) 97.3 F (36.3 C)  SpO2: 100%  Weight: 160 lb 4.8 oz (72.7 kg)  Height: 5\' 4"  (1.626 m)   Body mass index is 27.52 kg/m.  Advanced Directives 06/23/2020 12/24/2019 07/10/2019 03/19/2019 10/15/2018 05/09/2018 06/27/2017  Does Patient Have a Medical Advance Directive? Yes Yes Yes Yes Yes No Yes  Type of Paramedic of [place];Living will;Out of facility DNR (pink MOST or yellow form) [place];Living will [place];Living will - Living will;Healthcare Power of [place];Living will;Out of facility DNR (pink MOST or yellow form)  Does patient want to make changes to medical advance directive? - - - - - - -  Copy of Healthcare Power of Attorney in Chart? - - - - - - No - copy requested  Would patient like information on creating a medical advance directive? - - - - - No - Patient declined -    Current Medications (verified) Outpatient Encounter Medications as of 07/27/2020  Medication Sig  . carbidopa-levodopa (SINEMET IR) 25-100 MG tablet Take 2 tablets by mouth 3 (three) times daily.  . Coenzyme Q10 (COQ-10) 100 MG CAPS Take 1 tablet by mouth daily.  . Cyanocobalamin (VITAMIN B 12 PO) Take by mouth. 5, 000 mcg daily  . Glucosamine-Chondroit-Vit C-Mn (GLUCOSAMINE 1500 COMPLEX PO) Take 1 tablet by mouth daily.  . Multiple Vitamin (MULTIVITAMIN WITH MINERALS) TABS tablet Take 1 tablet by mouth daily.  . Multiple Vitamins-Minerals (PRESERVISION AREDS 2 PO) Take 1 tablet by mouth in the morning and at bedtime.   . NON FORMULARY  Herblax as needed  . NON FORMULARY Glucose Reduction  . NON FORMULARY Cholesterol Reduction  . rosuvastatin (CRESTOR) 5 MG tablet TAKE 1 TABLET BY MOUTH AT BEDTIME  . saline (AYR) GEL Place 1 application into both nostrils at bedtime.  . Vitamin E 100 units TABS Take 250 mg by mouth. Once a day  . [DISCONTINUED] escitalopram (LEXAPRO) 10 MG tablet Take 1 tablet (10 mg total) by mouth daily.  . [DISCONTINUED] escitalopram (LEXAPRO) 20 MG tablet Take 1 tablet (20 mg total) by mouth daily.  Marland Kitchen escitalopram (LEXAPRO) 10 MG tablet Take 1 tablet (10 mg total) by mouth daily.  Marland Kitchen escitalopram (LEXAPRO) 20 MG tablet Take 1 tablet (20 mg total) by mouth daily.  . [DISCONTINUED] Biotin 10000 MCG TABS Take by mouth. 1/2 tablet daily (Patient not taking: Reported on 07/27/2020)   No facility-administered encounter medications on file as of 07/27/2020.    Allergies (verified) Flagyl [metronidazole]   History: Past Medical History:  Diagnosis Date  . Hypertension    only one time  . Right knee pain   . Spinal stenosis of lumbar region    Past Surgical History:  Procedure Laterality Date  . HYSTERECTOMY ABDOMINAL WITH SALPINGECTOMY    . TONSILLECTOMY     Family History  Problem Relation Age of Onset  . Breast cancer Mother   . Melanoma Father   . Healthy Son  Social History   Socioeconomic History  . Marital status: Widowed    Spouse name: Not on file  . Number of children: 3  . Years of education: 44  . Highest education level: High school graduate  Occupational History  . Not on file  Tobacco Use  . Smoking status: Never Smoker  . Smokeless tobacco: Never Used  Vaping Use  . Vaping Use: Never used  Substance and Sexual Activity  . Alcohol use: No    Alcohol/week: 0.0 standard drinks  . Drug use: No  . Sexual activity: Not Currently  Other Topics Concern  . Not on file  Social History Narrative   Pt lives alone in her 1 story home- she has 3 sons, and a high school graduate.  She is right handed, she drinks coffee, and one soda a week mostly water   Social Determinants of Health   Financial Resource Strain: Not on file  Food Insecurity: Not on file  Transportation Needs: Not on file  Physical Activity: Not on file  Stress: Not on file  Social Connections: Not on file    Tobacco Counseling Counseling given: Not Answered    Diabetic? No    Activities of Daily Living In your present state of health, do you have any difficulty performing the following activities: 07/27/2020 06/28/2020  Hearing? N N  Vision? Y N  Difficulty concentrating or making decisions? Tempie Donning  Walking or climbing stairs? N N  Dressing or bathing? N N  Doing errands, shopping? Y N  Some recent data might be hidden    Patient Care Team: Lorrene Reid, PA-C as PCP - General [name], MD as Consulting Physician (Cardiology) Sydnee Levans, MD as Referring Physician (Dermatology) Hood Tat, Eustace Quail, DO as Consulting Physician (Neurology)  Indicate any recent Medical Services you may have received from other than [facility] providers in the past year (date may be approximate).     Assessment:   This is a routine wellness examination for [place].  Hearing/Vision screen No exam data present  Dietary issues and exercise activities discussed:  -Continue with PT once a week and daily walks in th evenings. Follow a heart healthy diet.   Goals Addressed   None    Depression Screen PHQ 2/9 Scores 07/27/2020 06/28/2020 12/15/2019 10/01/2019 09/11/2019 07/10/2019 05/13/2019  PHQ - 2 Score 0 4 0 0 0 0 0  PHQ- 9 Score 3 8 6 6 5  - 1    Fall Risk Fall Risk  07/27/2020 06/28/2020 06/23/2020 12/24/2019 12/15/2019  Falls in the past year? 0 0 0 1 1  Number falls in past yr: 0 0 0 0 0  Injury with Fall? 0 0 0 0 1  Risk for fall due to : History of fall(s) History of fall(s) - - History of fall(s)  Follow up Falls evaluation completed Falls evaluation completed - -  Falls evaluation completed    [place]:  Any stairs in or around the home? No  If so, are there any without handrails? No  Home free of loose throw rugs in walkways, pet beds, electrical cords, etc? Yes  Adequate lighting in your home to reduce risk of falls? Yes   ASSISTIVE DEVICES UTILIZED TO PREVENT FALLS:  Life alert? Yes  Use of a cane, walker or w/c? No  Grab bars in the bathroom? Yes  Shower chair or bench in shower? Yes  Elevated toilet seat or a handicapped toilet? Yes  TIMED UP AND GO:  Was the test performed? Yes .  Length of time to ambulate 10 feet: 22 sec.   Gait slow and steady without use of assistive device  Cognitive Function: MMSE - Mini Mental State Exam 08/28/2018  Orientation to time 4  Orientation to Place 5  Registration 3  Attention/ Calculation 5  Recall 2  Language- name 2 objects 2  Language- repeat 1  Language- follow 3 step command 3  Language- read & follow direction 1  Write a sentence 1  Copy design 1  Total score 28     6CIT Screen 07/27/2020 08/28/2018  What Year? 4 points 0 points  What month? 0 points 0 points  What time? 0 points 0 points  Count back from 20 0 points 2 points  Months in reverse 4 points 0 points  Repeat phrase 8 points 6 points  Total Score 16 8    Immunizations Immunization History  Administered Date(s) Administered  . Fluad Quad(high Dose 65+) 12/15/2019  . Influenza, High Dose Seasonal PF 12/08/2016, 12/21/2017, 11/29/2018  . Influenza-Unspecified 12/21/2017  . Moderna SARS-COV2 Booster Vaccination 01/16/2020  . PFIZER(Purple Top)SARS-COV-2 Vaccination 03/24/2019, 04/14/2019  . Pneumococcal Conjugate-13 07/09/2013  . Pneumococcal Polysaccharide-23 09/13/2001, 04/11/2006  . Tdap 07/09/2013    TDAP status: Up to date  Flu Vaccine status: Up to date  Pneumococcal vaccine status: Up to date  Covid-19 vaccine status: Completed vaccines  Qualifies for Shingles  Vaccine? Yes   Zostavax completed No   Shingrix Completed?: No.    Education has been provided regarding the importance of this vaccine. Patient has been advised to call insurance company to determine out of pocket expense if they have not yet received this vaccine. Advised may also receive vaccine at local pharmacy or Health Dept. Verbalized acceptance and understanding.  Screening Tests Health Maintenance  Topic Date Due  . Zoster Vaccines- Shingrix (1 of 2) Never done  . DEXA SCAN  Never done  . INFLUENZA VACCINE  09/20/2020  . TETANUS/TDAP  07/10/2023  . COVID-19 Vaccine  Completed  . PNA vac Low Risk Adult  Completed  . Pneumococcal Vaccine 53-64 Years old  Aged Out  . HPV VACCINES  Aged Out    Health Maintenance  Health Maintenance Due  Topic Date Due  . Zoster Vaccines- Shingrix (1 of 2) Never done  . DEXA SCAN  Never done    Colorectal cancer screening: No longer required.   Mammogram status: Completed 08/09/2018. Repeat every year  Bone Density status: Ordered 07/27/2020. Pt provided with contact info and advised to call to schedule appt.  Lung Cancer Screening: (Low Dose CT Chest recommended if Age 57-80 years, 30 pack-year currently smoking OR have quit w/in 15years.) does not qualify.   Lung Cancer Screening Referral: no  Additional Screening:  Hepatitis C Screening: does not qualify;   Vision Screening: Recommended annual ophthalmology exams for early detection of glaucoma and other disorders of the eye. Is the patient up to date with their annual eye exam?  Yes  Who is the provider or what is the name of the office in which the patient attends annual eye exams?  If pt is not established with a provider, would they like to be referred to a provider to establish care? No .   Dental Screening: Recommended annual dental exams for proper oral hygiene  Community Resource Referral / Chronic Care Management: CRR required this visit?  No   CCM required this visit?   No  Plan:  -Will collect fasting labs next follow up visit. -Continue to follow up with Neurology.  -Continue current medication regimen. Tolerating Lexapro 30 mg without issues, will closely monitor therapy and plan to obtain EKG at follow up visit for medication management.  -Follow up in 4 months for HTN, HLD, mood   I have personally reviewed and noted the following in the patient's chart:   . Medical and social history . Use of alcohol, tobacco or illicit drugs  . Current medications and supplements including opioid prescriptions.  . Functional ability and status . Nutritional status . Physical activity . Advanced directives . List of other physicians . Hospitalizations, surgeries, and ER visits in previous 12 months . Vitals . Screenings to include cognitive, depression, and falls . Referrals and appointments  In addition, I have reviewed and discussed with patient certain preventive protocols, quality metrics, and best practice recommendations. A written personalized care plan for preventive services as well as general preventive health recommendations were provided to patient.    Lorrene Reid, PA-C   07/27/2020

## 2020-07-27 NOTE — Patient Instructions (Signed)
Preventive Care 84 Years and Older, Female Preventive care refers to lifestyle choices and visits with your health care provider that can promote health and wellness. This includes:  A yearly physical exam. This is also called an annual wellness visit.  Regular dental and eye exams.  Immunizations.  Screening for certain conditions.  Healthy lifestyle choices, such as: ? Eating a healthy diet. ? Getting regular exercise. ? Not using drugs or products that contain nicotine and tobacco. ? Limiting alcohol use. What can I expect for my preventive care visit? Physical exam Your health care provider will check your:  Height and weight. These may be used to calculate your BMI (body mass index). BMI is a measurement that tells if you are at a healthy weight.  Heart rate and blood pressure.  Body temperature.  Skin for abnormal spots. Counseling Your health care provider may ask you questions about your:  Past medical problems.  Family's medical history.  Alcohol, tobacco, and drug use.  Emotional well-being.  Home life and relationship well-being.  Sexual activity.  Diet, exercise, and sleep habits.  History of falls.  Memory and ability to understand (cognition).  Work and work Statistician.  Pregnancy and menstrual history.  Access to firearms. What immunizations do I need? Vaccines are usually given at various ages, according to a schedule. Your health care provider will recommend vaccines for you based on your age, medical history, and lifestyle or other factors, such as travel or where you work.   What tests do I need? Blood tests  Lipid and cholesterol levels. These may be checked every 5 years, or more often depending on your overall health.  Hepatitis C test.  Hepatitis B test. Screening  Lung cancer screening. You may have this screening every year starting at age 84 if you have a 30-pack-year history of smoking and currently smoke or have quit within  the past 15 years.  Colorectal cancer screening. ? All adults should have this screening starting at age 84 and continuing until age 84. ? Your health care provider may recommend screening at age 84 if you are at increased risk. ? You will have tests every 1-10 years, depending on your results and the type of screening test.  Diabetes screening. ? This is done by checking your blood sugar (glucose) after you have not eaten for a while (fasting). ? You may have this done every 1-3 years.  Mammogram. ? This may be done every 1-2 years. ? Talk with your health care provider about how often you should have regular mammograms.  Abdominal aortic aneurysm (AAA) screening. You may need this if you are a current or former smoker.  BRCA-related cancer screening. This may be done if you have a family history of breast, ovarian, tubal, or peritoneal cancers. Other tests  STD (sexually transmitted disease) testing, if you are at risk.  Bone density scan. This is done to screen for osteoporosis. You may have this done starting at age 84. Talk with your health care provider about your test results, treatment options, and if necessary, the need for more tests. Follow these instructions at home: Eating and drinking  Eat a diet that includes fresh fruits and vegetables, whole grains, lean protein, and low-fat dairy products. Limit your intake of foods with high amounts of sugar, saturated fats, and salt.  Take vitamin and mineral supplements as recommended by your health care provider.  Do not drink alcohol if your health care provider tells you not to drink.  If you drink alcohol: ? Limit how much you have to 84 drink a day. ? Be aware of how much alcohol is in your drink. In the [place] one drink equals one 12 oz bottle of beer (355 mL), one 5 oz glass of wine (148 mL), or one 1 oz glass of hard liquor (44 mL).   Lifestyle  Take daily care of your teeth and gums. Brush your teeth every morning  and night with fluoride toothpaste. Floss one time each day.  Stay active. Exercise for at least 30 minutes 5 or more days each week.  Do not use any products that contain nicotine or tobacco, such as cigarettes, e-cigarettes, and chewing tobacco. If you need help quitting, ask your health care provider.  Do not use drugs.  If you are sexually active, practice safe sex. Use a condom or other form of protection in order to prevent STIs (sexually transmitted infections).  Talk with your health care provider about taking a low-dose aspirin or statin.  Find healthy ways to cope with stress, such as: ? Meditation, yoga, or listening to music. ? Journaling. ? Talking to a trusted person. ? Spending time with friends and family. Safety  Always wear your seat belt while driving or riding in a vehicle.  Do not drive: ? If you have been drinking alcohol. Do not ride with someone who has been drinking. ? When you are tired or distracted. ? While texting.  Wear a helmet and other protective equipment during sports activities.  If you have firearms in your house, make sure you follow all gun safety procedures. What's next?  Visit your health care provider once a year for an annual wellness visit.  Ask your health care provider how often you should have your eyes and teeth checked.  Stay up to date on all vaccines. This information is not intended to replace advice given to you by your health care provider. Make sure you discuss any questions you have with your health care provider. Document Revised: 01/28/2020 Document Reviewed: 01/31/2018 Elsevier Patient Education  2021 [employer] Inc.  

## 2020-07-27 NOTE — Telephone Encounter (Signed)
[facility] does not have anything from [place] for the carbidopa levodopa  90 day supply.   Patient will be running out of medication this week.

## 2020-07-27 NOTE — Telephone Encounter (Signed)
I sent that in just now to [facility].  Let pt know

## 2020-07-28 ENCOUNTER — Telehealth: Payer: Self-pay | Admitting: Neurology

## 2020-07-28 DIAGNOSIS — G4700 Insomnia, unspecified: Secondary | ICD-10-CM | POA: Diagnosis not present

## 2020-07-28 DIAGNOSIS — F32A Depression, unspecified: Secondary | ICD-10-CM | POA: Diagnosis not present

## 2020-07-28 DIAGNOSIS — M1711 Unilateral primary osteoarthritis, right knee: Secondary | ICD-10-CM | POA: Diagnosis not present

## 2020-07-28 DIAGNOSIS — G20 Parkinson's disease: Secondary | ICD-10-CM | POA: Diagnosis not present

## 2020-07-28 DIAGNOSIS — B353 Tinea pedis: Secondary | ICD-10-CM | POA: Diagnosis not present

## 2020-07-28 DIAGNOSIS — D225 Melanocytic nevi of trunk: Secondary | ICD-10-CM | POA: Diagnosis not present

## 2020-07-28 DIAGNOSIS — M48061 Spinal stenosis, lumbar region without neurogenic claudication: Secondary | ICD-10-CM | POA: Diagnosis not present

## 2020-07-28 DIAGNOSIS — B351 Tinea unguium: Secondary | ICD-10-CM | POA: Diagnosis not present

## 2020-07-28 DIAGNOSIS — L821 Other seborrheic keratosis: Secondary | ICD-10-CM | POA: Diagnosis not present

## 2020-07-28 DIAGNOSIS — Z90710 Acquired absence of both cervix and uterus: Secondary | ICD-10-CM | POA: Diagnosis not present

## 2020-07-28 DIAGNOSIS — Z85828 Personal history of other malignant neoplasm of skin: Secondary | ICD-10-CM | POA: Diagnosis not present

## 2020-07-28 DIAGNOSIS — D1801 Hemangioma of skin and subcutaneous tissue: Secondary | ICD-10-CM | POA: Diagnosis not present

## 2020-07-28 DIAGNOSIS — I10 Essential (primary) hypertension: Secondary | ICD-10-CM | POA: Diagnosis not present

## 2020-07-28 NOTE — Telephone Encounter (Signed)
Called patient and spoke to daughter [name] and informed her that a 15 day supply has been sent to [place] and to call us if there is an issues.

## 2020-07-28 NOTE — Telephone Encounter (Signed)
Pt's daughter called in wanting to let Dr. Carles Collet know Dr. Mariel Kansky will be taking over prescribing the pt's Lexapro. They did increase it to 30 mg.

## 2020-07-28 NOTE — Telephone Encounter (Signed)
noted 

## 2020-07-29 ENCOUNTER — Ambulatory Visit: Payer: Medicare PPO | Admitting: Physician Assistant

## 2020-08-06 DIAGNOSIS — L84 Corns and callosities: Secondary | ICD-10-CM | POA: Diagnosis not present

## 2020-08-06 DIAGNOSIS — B351 Tinea unguium: Secondary | ICD-10-CM | POA: Diagnosis not present

## 2020-08-06 DIAGNOSIS — L603 Nail dystrophy: Secondary | ICD-10-CM | POA: Diagnosis not present

## 2020-08-06 DIAGNOSIS — I70213 Atherosclerosis of native arteries of extremities with intermittent claudication, bilateral legs: Secondary | ICD-10-CM | POA: Diagnosis not present

## 2020-08-24 ENCOUNTER — Ambulatory Visit: Payer: Medicare PPO | Admitting: Family Medicine

## 2020-08-24 ENCOUNTER — Other Ambulatory Visit: Payer: Self-pay

## 2020-08-24 ENCOUNTER — Encounter: Payer: Self-pay | Admitting: Family Medicine

## 2020-08-24 DIAGNOSIS — M1711 Unilateral primary osteoarthritis, right knee: Secondary | ICD-10-CM | POA: Diagnosis not present

## 2020-08-24 NOTE — Progress Notes (Signed)
Jamie [name] Phone: [phone] Subjective:   Jamie [name], LAT, ATC acting as a scribe for Jamie Saas, DO  I'm seeing this patient by the request  of:  Jamie Reid, PA-C  CC: Right knee pain  [id number]  Jamie [name] is a 86 y.o. female coming in with complaint of R knee pain. Pt was last seen by Dr. Tamala Julian on 06/16/20 and was given a R knee steroid injection. Today, pt reports R knee started hurting again about a month ago. Pt notes much relief from prior steroid injection. No swelling noted.      Past Medical History:  Diagnosis Date   Hypertension    only one time   Right knee pain    Spinal stenosis of lumbar region    Past Surgical History:  Procedure Laterality Date   HYSTERECTOMY ABDOMINAL WITH SALPINGECTOMY     TONSILLECTOMY     Social History   Socioeconomic History   Marital status: Widowed    Spouse name: Not on file   Number of children: 3   Years of education: 12   Highest education level: High school graduate  Occupational History   Not on file  Tobacco Use   Smoking status: Never   Smokeless tobacco: Never  Vaping Use   Vaping Use: Never used  Substance and Sexual Activity   Alcohol use: No    Alcohol/week: 0.0 standard drinks   Drug use: No   Sexual activity: Not Currently  Other Topics Concern   Not on file  Social History Narrative   Pt lives alone in her 1 story home- she has 3 sons, and a high school graduate. She is right handed, she drinks coffee, and one soda a week mostly water   Social Determinants of Health   Financial Resource Strain: Not on file  Food Insecurity: Not on file  Transportation Needs: Not on file  Physical Activity: Not on file  Stress: Not on file  Social Connections: Not on file   Allergies  Allergen Reactions   Flagyl [Metronidazole] Rash    Possible cause of rash    Family History  Problem Relation Age of Onset    Breast cancer Mother    Melanoma Father    Healthy Son      Current Outpatient Medications (Cardiovascular):    rosuvastatin (CRESTOR) 5 MG tablet, TAKE 1 TABLET BY MOUTH AT BEDTIME  Current Outpatient Medications (Respiratory):    saline (AYR) GEL, Place 1 application into both nostrils at bedtime.   Current Outpatient Medications (Hematological):    Cyanocobalamin (VITAMIN B 12 PO), Take by mouth. 5, 000 mcg daily  Current Outpatient Medications (Other):    carbidopa-levodopa (SINEMET IR) 25-100 MG tablet, Take 2 tablets by mouth 3 (three) times daily.   Coenzyme Q10 (COQ-10) 100 MG CAPS, Take 1 tablet by mouth daily.   escitalopram (LEXAPRO) 10 MG tablet, Take 1 tablet (10 mg total) by mouth daily.   escitalopram (LEXAPRO) 20 MG tablet, Take 1 tablet (20 mg total) by mouth daily.   Glucosamine-Chondroit-Vit C-Mn (GLUCOSAMINE 1500 COMPLEX PO), Take 1 tablet by mouth daily.   Multiple Vitamin (MULTIVITAMIN WITH MINERALS) TABS tablet, Take 1 tablet by mouth daily.   Multiple Vitamins-Minerals (PRESERVISION AREDS 2 PO), Take 1 tablet by mouth in the morning and at bedtime.    NON FORMULARY, Herblax as needed   NON FORMULARY, Glucose Reduction   NON FORMULARY, Cholesterol Reduction  Vitamin E 100 units TABS, Take 250 mg by mouth. Once a day   Reviewed prior external information including notes and imaging from  primary care provider As well as notes that were available from care everywhere and other healthcare systems.  Past medical history, social, surgical and family history all reviewed in electronic medical record.  No pertanent information unless stated regarding to the chief complaint.   Review of Systems:  No headache, visual changes, nausea, vomiting, diarrhea, constipation, dizziness, abdominal pain, skin rash, fevers, chills, night sweats, weight loss, swollen lymph nodes,  joint swelling, chest pain, shortness of breath, mood changes. POSITIVE muscle aches, body  aches  Objective  Blood pressure (!) 142/88, pulse 74, height 5\' 4"  (1.626 m), weight 160 lb 12.8 oz (72.9 kg), SpO2 97 %.   General: No apparent distress alert and oriented x3 mood and affect normal, dressed appropriately.  Mild masked facies noted HEENT: Pupils equal, extraocular movements intact  Respiratory: Patient's speak in full sentences and does not appear short of breath  Cardiovascular: Trace lower extremity edema, non tender, no erythema  Gait antalgic with shuffling gait Knee: Right valgus deformity noted. Large thigh to calf ratio.  Tender to palpation over medial and PF joint line.  Patient does have tonicity of the muscles instability with valgus force.  painful patellar compression. Patellar glide with moderate crepitus. Patellar and quadriceps tendons unremarkable. Hamstring and quadriceps strength is normal.   After informed written and verbal consent, patient was seated on exam table. Right knee was prepped with alcohol swab and utilizing anterolateral approach, patient's right knee space was injected with 4:1  marcaine 0.5%: Kenalog 40mg /dL. Patient tolerated the procedure well without immediate complications.   Impression and Recommendations:     The above documentation has been reviewed and is accurate and complete Jamie Pulley, DO

## 2020-08-24 NOTE — Patient Instructions (Addendum)
Good to see you!  I am so SO sorry for your loss.  You received a steroid injection in your right knee .Call or go to the ER if you develop a large red swollen joint with extreme pain or oozing puss.    Pennsaid pinkie amount topically 2 times daily as needed.    Arnica or use Voltaren gel (Generic Diclofenac Gel) up to 4x daily for pain as needed.  This is available over-the-counter as both the name brand Voltaren gel and the generic diclofenac gel.   See me again in 10-12 weeks.

## 2020-08-24 NOTE — Assessment & Plan Note (Signed)
Patient given repeat injection again today.  Tolerated the procedure well.  He does feel that the steroid injection seem to be doing somewhat better than the viscosupplementation but has had some mild improvement with viscosupplementation previously.  Patient would like to see if she can continue to do 10 to 12 weeks before any other injection.  Chronic problem with exacerbation.  Patient is accompanied with family member who also states that there would like to avoid surgical intervention if possible.  Follow-up again in 10 to 12 weeks

## 2020-09-14 ENCOUNTER — Other Ambulatory Visit: Payer: Self-pay | Admitting: Physician Assistant

## 2020-09-29 ENCOUNTER — Ambulatory Visit (INDEPENDENT_AMBULATORY_CARE_PROVIDER_SITE_OTHER): Payer: Medicare PPO | Admitting: Psychology

## 2020-09-29 ENCOUNTER — Encounter: Payer: Self-pay | Admitting: Psychology

## 2020-09-29 ENCOUNTER — Ambulatory Visit: Payer: Medicare PPO

## 2020-09-29 ENCOUNTER — Other Ambulatory Visit: Payer: Self-pay

## 2020-09-29 DIAGNOSIS — F0280 Dementia in other diseases classified elsewhere without behavioral disturbance: Secondary | ICD-10-CM

## 2020-09-29 DIAGNOSIS — R464 Slowness and poor responsiveness: Secondary | ICD-10-CM | POA: Diagnosis not present

## 2020-09-29 DIAGNOSIS — R41842 Visuospatial deficit: Secondary | ICD-10-CM

## 2020-09-29 DIAGNOSIS — R411 Anterograde amnesia: Secondary | ICD-10-CM | POA: Diagnosis not present

## 2020-09-29 DIAGNOSIS — R41841 Cognitive communication deficit: Secondary | ICD-10-CM | POA: Diagnosis not present

## 2020-09-29 DIAGNOSIS — R4189 Other symptoms and signs involving cognitive functions and awareness: Secondary | ICD-10-CM | POA: Diagnosis not present

## 2020-09-29 HISTORY — DX: Dementia in other diseases classified elsewhere, unspecified severity, without behavioral disturbance, psychotic disturbance, mood disturbance, and anxiety: F02.80

## 2020-09-29 NOTE — Progress Notes (Signed)
   Psychometrician Note   Cognitive testing was administered to Jamie [name] by Cruzita Lederer, B.S. (psychometrist) under the supervision of Dr. Christia Reading, Ph.D., licensed psychologist on 09/29/2020. Jamie [name] did not appear overtly distressed by the testing session per behavioral observation or responses across self-report questionnaires. Rest breaks were offered.    The battery of tests administered was selected by Dr. Christia Reading, Ph.D. with consideration to Jamie [name]'s current level of functioning, the nature of her symptoms, emotional and behavioral responses during interview, level of literacy, observed level of motivation/effort, and the nature of the referral question. This battery was communicated to the psychometrist. Communication between Dr. Christia Reading, Ph.D. and the psychometrist was ongoing throughout the evaluation and Dr. Christia Reading, Ph.D. was immediately accessible at all times. Dr. Christia Reading, Ph.D. provided supervision to the psychometrist on the date of this service to the extent necessary to assure the quality of all services provided.    Jamie [name] will return within approximately 1-2 weeks for an interactive feedback session with Dr. Melvyn Novas at which time her test performances, clinical impressions, and treatment recommendations will be reviewed in detail. Jamie [name] understands she can contact our office should she require our assistance before this time.  A total of 125 minutes of billable time were spent face-to-face with Jamie [name] by the psychometrist. This includes both test administration and scoring time. Billing for these services is reflected in the clinical report generated by Dr. Christia Reading, Ph.D.  This note reflects time spent with the psychometrician and does not include test scores or any clinical interpretations made by Dr. Melvyn Novas. The full report will follow in a separate note.

## 2020-09-29 NOTE — Progress Notes (Signed)
NEUROPSYCHOLOGICAL EVALUATION [place] [place] Department of Neurology  Date of Evaluation: September 29, 2020  Reason for Referral:   ELONNA CALLEJO is a 86 y.o. right-handed Caucasian female referred by Alonza Bogus, D.O., to characterize her current cognitive functioning and assist with diagnostic clarity and treatment planning in the context of Parkinson's disease and concern for progressive cognitive decline.   Assessment and Plan:   Clinical Impression(s): Ms. Carelock's pattern of performance is suggestive of fairly diffuse cognitive impairment with scores commonly in the exceptionally low to well below average normative ranges relative to age-matched peers. Executive functioning and encoding/retrieval aspects of memory represented her most significant impairments. She answered yes/no recognition questions associated with memory tasks reasonably well (i.e., below average to average) and exhibited some variability across processing speed and attention/concentration. However, all other domains were largely below expectation. Ms. Newborn and her daughter reported her having difficulties completing instrumental activities of daily living (ADLs) independently, especially surrounding medication and financial management. She has also stopped driving due to cognitive concerns. This, coupled with evidence for significant cognitive dysfunction described above, suggests that she meets criteria for a Major Neurocognitive Disorder ("dementia") at the present time.  The etiology of ongoing cognitive dysfunction is unclear and potentially mixed. Despite concerns surrounding Parkinson's disease, Ms. Demby's patterns across testing are perhaps more concerning for underlying Alzheimer's disease. She had trouble benefiting from repeated learning trials and was fully amnestic (i.e., 0% retention) across all memory assessments. She also demonstrated impairments across semantic fluency and  confrontation naming. This pattern of performance is characteristic of Alzheimer's disease and not typically seen in earlier Parkinson's disease dementia presentations where you would expect predominant frontal-subcortical dysfunction. Gait abnormalities, while more rare, can be accompanying features of Alzheimer's disease and this condition being the underlying pathology would offer an explanation as to why Ms. Kisiel's 2019 DaTscan came back normal. However, it is worth stating that she did better than expected across yes/no recognition trials, which does not suggest a complete information storage deficit, and overall dysfunction was more diffuse than just these highlighted areas. Alzheimer's disease and Parkinson's disease can certainly co-occur (i.e., mixed dementia presentation). Or perhaps, if Alzheimer's disease is the true dominant pathology, she may have greater alpha-synuclein involvement relative to typical presentations. She does not demonstrate behavioral features worrisome for Lewy body dementia at the present time. I also do not have concerns surrounding frontotemporal dementia. Neuroimaging back in 2018 did not reveal a strong vascular component. Continued medical monitoring will be important moving forward.   Recommendations: A repeat neuropsychological evaluation in 12-18 months (or sooner if functional decline is noted) is recommended to assess the trajectory of future cognitive decline should it occur. This will also aid in future efforts towards improved diagnostic clarity.  Updated neuroimaging may be prudent given that her prior brain MRI was performed in 2018 and DaTscan in 2019. She should discuss the pros and cons of this with Dr. Carles Collet. She could also discuss with Dr. Carles Collet medications commonly prescribed for individuals with prominent memory impairment. It is important to highlight that these medications slow down functional decline in some individuals. Unfortunately, no current treatment  is able to stop or reverse decline at the present time.   Should there be further progression of current deficits time, Ms. Khouri is unlikely to regain any independent living skills lost. She will benefit from the establishment and maintenance of a routine in order to maximize her functional abilities over time.  It will  be important for Ms. Houchin to have another person with her when in situations where she may need to process information, weigh the pros and cons of different options, and make decisions, in order to ensure that she fully understands and recalls all information to be considered.  If not already done, Ms. Vallone and her family may want to discuss her wishes regarding durable power of attorney and medical decision making, so that she can have input into these choices. Additionally, they may wish to discuss future plans for caretaking and seek out community options for in home/residential care should they become necessary.  I agree with her family's decision to have her fully abstain from driving. Should she or her family wish to pursue a formalized driving evaluation, they would be encouraged to contact [employer] in [place], [place] at [phone]. Another option would be through [facility]; however, the latter would likely require a referral from a medical doctor. [facility] can be reached directly at [phone].   Ms. Siciliano is encouraged to attend to lifestyle factors for brain health (e.g., regular physical exercise, good nutrition habits, regular participation in cognitively-stimulating activities, and general stress management techniques), which are likely to have benefits for both emotional adjustment and cognition. Optimal control of vascular risk factors (including safe cardiovascular exercise and adherence to dietary recommendations) is encouraged. Continued participation in activities which provide mental stimulation and social interaction is also  recommended.   Important information to remember should be provided in written format in all instances. This should be placed in a highly visible and commonly frequented location of her residence to help promote recall.   To address problems with processing speed, she may wish to consider:   -Ensuring that she is alerted when essential material or instructions are being presented   -Adjusting the speed at which new information is presented   -Allowing for more time in comprehending, processing, and responding in conversation  To address problems with fluctuating attention and executive dysfunction, she may wish to consider:   -Avoiding external distractions when needing to concentrate   -Limiting exposure to fast paced environments with multiple sensory demands   -Writing down complicated information and using checklists   -Attempting and completing one task at a time (i.e., no multi-tasking)   -Verbalizing aloud each step of a task to maintain focus   -Reducing the amount of information considered at one time  Review of Records:   Ms. Horrigan was most recently seen by [facility] Guiles Tat, D.O.) on 06/23/2020 for follow-up of Parkinson's disease/SWEDD. At that time, Ms. Sesay denied recent falls. She did report having trouble moving with some balance instability. She generally denied ongoing lightheadedness or near syncope. Hallucinations were also denied.  Her family reportedly called Dr. Carles Collet in mid-March and informed her that Ms. Avakian was not doing well. Some of this was attributed to her significant other recently passing away. They ultimately decided to continue the Lexapro which had been increased during the previous visit. Ms. Beddoe described her mood as good but her daughter seemed to disagree. Regarding ADLs, she does seem to be forgetting medication more often now that her partner passed away. Her daughter did note that despite putting reminders all over the house, Ms. Lawlor may  get up and get ready for church even though it is not a Sunday. Across her neurological exam. Dr. Carles Collet noted mildly increased tone in the bilateral upper extremities and significant slowness of all rapid alternating movements.  Ultimately, Dr. Carles Collet expressed concerns surrounding a developing Parkinson's disease dementia presentation. Ms. Gollihar was referred for a comprehensive neuropsychological evaluation to characterize her cognitive abilities and to assist with diagnostic clarity and treatment planning.   Brain MRI on 07/31/2016 revealed mild atrophy and mild chronic small vessel ischemic disease. DaTscan on 04/12/2017 revealed symmetric appropriate dopamine distribution within the left and right striata.   Past Medical History:  Diagnosis Date   Abnormality of gait 04/18/2017   Degenerative joint disease of knee, right 03/19/2018   Injected April 18, 2018 Approved for Durolane R knee 3.4.20. Patient given another steroid injection Jun 24, 2018.  Repeat injection September 09, 2018 repeat injection February 27, 2019 repeat 6/110/20221 bilateral injections given December 01, 2019 Right knee injected April 20, 2020 monovisc May 19, 2018 Repeat steroid injection given June 16, 2020 repeat injection August 24, 2020   Essential hypertension 08/14/2016   Generalized anxiety disorder 06/27/2017   History of dizziness 06/27/2017   History of hysterectomy for benign disease 06/27/2017   History of vitamin D deficiency 08/22/2017   Leukocytes in urine 08/14/2016   Lumbar facet arthropathy 06/27/2017   Mixed hyperlipidemia 08/22/2017   Osteoarthritis of knee 09/18/2017   Pancolitis 08/14/2016   Parkinson's disease 03/13/2017   Prediabetes 08/22/2017   Right knee pain    Spinal stenosis of lumbar region    Weakness of both hips 04/18/2017   White coat syndrome without diagnosis of hypertension 08/22/2017    Past Surgical History:  Procedure Laterality Date   HYSTERECTOMY ABDOMINAL WITH SALPINGECTOMY      TONSILLECTOMY      Current Outpatient Medications:    carbidopa-levodopa (SINEMET IR) 25-100 MG tablet, Take 2 tablets by mouth 3 (three) times daily., Disp: 540 tablet, Rfl: 1   Coenzyme Q10 (COQ-10) 100 MG CAPS, Take 1 tablet by mouth daily., Disp: , Rfl:    Cyanocobalamin (VITAMIN B 12 PO), Take by mouth. 5, 000 mcg daily, Disp: , Rfl:    escitalopram (LEXAPRO) 10 MG tablet, Take 1 tablet (10 mg total) by mouth daily., Disp: 90 tablet, Rfl: 1   escitalopram (LEXAPRO) 20 MG tablet, Take 1 tablet (20 mg total) by mouth daily., Disp: 90 tablet, Rfl: 1   Glucosamine-Chondroit-Vit C-Mn (GLUCOSAMINE 1500 COMPLEX PO), Take 1 tablet by mouth daily., Disp: , Rfl:    Multiple Vitamin (MULTIVITAMIN WITH MINERALS) TABS tablet, Take 1 tablet by mouth daily., Disp: , Rfl:    Multiple Vitamins-Minerals (PRESERVISION AREDS 2 PO), Take 1 tablet by mouth in the morning and at bedtime. , Disp: , Rfl:    NON FORMULARY, Herblax as needed, Disp: , Rfl:    NON FORMULARY, Glucose Reduction, Disp: , Rfl:    NON FORMULARY, Cholesterol Reduction, Disp: , Rfl:    rosuvastatin (CRESTOR) 5 MG tablet, TAKE 1 TABLET BY MOUTH AT BEDTIME, Disp: 90 tablet, Rfl: 0   saline (AYR) GEL, Place 1 application into both nostrils at bedtime., Disp: 14.1 g, Rfl: 0   Vitamin E 100 units TABS, Take 250 mg by mouth. Once a day, Disp: , Rfl:   Clinical Interview:   The following information was obtained during a clinical interview with Ms. Lindenbaum and her daughter prior to cognitive testing.  Cognitive Symptoms: Decreased short-term memory: Endorsed. Provided examples included trouble recalling the details of previous conversations, trouble recalling names of familiar individuals, and misplacing/losing things often. Memory dysfunction was said to be present for at least the past several years. Ms. Cherrington stated that memory certainly  had not improved during that time, suggesting a stable if not mildly progressive presentation.  Decreased  long-term memory: Denied. Decreased attention/concentration: Denied. Reduced processing speed: Endorsed. Difficulties with executive functions: Endorsed. She and her daughter reported some trouble with disorganization. She also reported some trouble with indecision. However, while her daughter acknowledged that this had worsened, some of this was said to be a longstanding personality trait.  Difficulties with emotion regulation: Denied. Difficulties with receptive language: Denied. Difficulties with word finding: Endorsed. Decreased visuoperceptual ability: Endorsed.  Difficulties completing ADLs: Endorsed. Her family provides assistance with medication management. Per her daughter, they started organizing her pills on a weekly basis. However, Ms. Kauk had trouble managing this given that she takes medications at six different time points throughout the day. Her daughter noted that she or another family member will now organize medications for Ms. Folden on a daily basis; this frequency was said to currently be working well. Her family also manages finances and bill paying. Ms. Coday acknowledged that she would likely have trouble with this if it was necessary that she perform these duties alone. She does not drive. She stated that family members asked her to stop driving out of caution given diminished reaction time.   Additional Medical History: History of traumatic brain injury/concussion: Denied. History of stroke: Denied. History of seizure activity: Denied. History of known exposure to toxins: Denied. Symptoms of chronic pain: Endorsed. She reported ongoing right knee pain and symptoms of osteoarthritis. While not painful, she also described a hot sensation which crosses over her chest when waking up in the morning. She was unsure what could be causing this.  Experience of frequent headaches/migraines: Denied. Symptoms were said to occur rarely.  Frequent instances of dizziness/vertigo:  Denied. Symptoms were said to occur infrequently.   Sensory changes: She noted that food does not seem to taste like it used to. Other sensory changes/difficulties (e.g., vision, hearing, smell) were denied.  Balance/coordination difficulties: Endorsed. She reported notable balance instability and her gait has slowed considerably over the years. She only reported one prior fall which occurred in the past. No recent falls were described. Her daughter stated that lower extremity weakness is significant. Right knee pain also contributes to balance instability and gait issues.  Other motor difficulties: Denied. Ms. Mcleland and her daughter generally denied significant tremors.  Sleep History: Estimated hours obtained each night: 8+ hours.  Difficulties falling asleep: Denied. Medical records do mention some insomnia and that she has been taking melatonin with some benefit.  Difficulties staying asleep: Denied. Feels rested and refreshed upon awakening: Endorsed.  History of snoring: Denied. History of waking up gasping for air: Denied. Witnessed breath cessation while asleep: Denied.  History of vivid dreaming: Denied. Excessive movement while asleep: Denied. Instances of acting out her dreams: Denied.  Psychiatric/Behavioral Health History: Depression: Ms. Cornely described her current mood as "normal." However, this was followed by her stating "I don't feel my best." Her daughter added that Ms. Franzoni has lost two "very precious" and close individuals to her within the past four months, which has been challenging. When asked about the effects of this, Ms. Dulak reported feeling as though she was doing well all things considered. She denied to her knowledge ever being diagnosed with a depressive mental health condition. Current or remote suicidal ideation, intent, or plan was denied.  Anxiety: Her daughter reported ongoing symptoms of generalized anxiety. These seem to be more recent as she did not  report her mother having a  longstanding history of these symptoms.  Mania: Denied. Trauma History: Denied. Visual/auditory hallucinations: Denied. Delusional thoughts: Denied.  Tobacco: Denied. Alcohol: She denied current alcohol consumption as well as a history of problematic alcohol abuse or dependence.  Recreational drugs: Denied.  Family History: Problem Relation Age of Onset   Breast cancer Mother    Melanoma Father    Healthy Son    This information was confirmed by Ms. Pechacek.  Academic/Vocational History: Highest level of educational attainment: 12 years. She graduated from high school and described herself as an average (A/B) student in academic settings. No relative weaknesses were identified.  History of developmental delay: Denied. History of grade repetition: Denied. Enrollment in special education courses: Denied. History of LD/ADHD: Denied.  Employment: Retired. 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   Evaluation Results:   Behavioral Observations: Ms. Zdrojewski was accompanied by her daughter, arrived to her appointment on time, and was appropriately dressed and groomed. She appeared alert and oriented. She ambulated slowly and exhibited small steps. However, she did not utilize a cane or other assistive device and did not exhibit notable instability. Gross motor functioning appeared intact upon informal observation and no abnormal movements (e.g., tremors) were noted. Her affect was generally relaxed and positive. Spontaneous speech was fluent and word finding difficulties were not observed during the clinical interview. Thought processes were coherent, organized, and normal in content. Insight into her cognitive difficulties appeared somewhat limited in that she likely exhibits greater levels of dysfunction than she may realize.   During testing, she  exhibited notable trouble comprehending tasks, especially those more complex in nature. Several tasks (TMT B, D-KEFS Color Word) had to be discontinued after only administering the sample due to significant comprehension difficulties. Sustained attention was appropriate. Word finding difficulties were observed across relevant tasks. Task engagement was adequate and she persisted when challenged. She required additional assistance while filling out questionnaires due to her commonly losing her place. Overall, Ms. Kepner was cooperative with the clinical interview and subsequent testing procedures.   Adequacy of Effort: The validity of neuropsychological testing is limited by the extent to which the individual being tested may be assumed to have exerted adequate effort during testing. Ms. Schnieder expressed her intention to perform to the best of her abilities and exhibited adequate task engagement and persistence. Scores across stand-alone and embedded performance validity measures were within expectation. As such, the results of the current evaluation are believed to be a valid representation of Ms. Spinella's current cognitive functioning.  Test Results: Ms. Brace was largely oriented at the time of the current evaluation. She incorrectly stated her age 86") and was one day off when stating the current date.   Intellectual abilities based upon educational and vocational attainment were estimated to be in the average range. Premorbid abilities were estimated to be within the below average range based upon a single-word reading test.   Processing speed was variable, ranging from the exceptionally low to average normative ranges. Basic attention was well below average to below average. More complex attention (e.g., working memory) was also well below average to below average. Executive functioning was exceptionally low to well below average. She scored in the below average range across a task assessing safety and  judgment. Points were generally lost on this task due to vague responses rather than responses which were inherently unsafe or troubling.  Assessed receptive language abilities were below average. Despite this and as mentioned above, she did have notable difficulties understanding task instructions at times. She did answer all questions asked of her appropriately during interview without difficulty. Assessed expressive language was mildly variable. Phonemic fluency was below average, semantic fluency was well below average, and confrontation naming was exceptionally low.     Assessed visuospatial/visuoconstructional abilities were exceptionally low to below average. Points were lost on her drawing of a clock due to poor numerical arrangement and representation of only a single hand pointing in an incorrect location. Points were lost on her copy of a complex figure primarily due to her omitting two aspects entirely.    Learning (i.e., encoding) of novel verbal information was exceptionally low to well below average. Spontaneous delayed recall (i.e., retrieval) of previously learned information was exceptionally low. Retention rates were 0% across a story learning task, 0% across a list learning task, and 0% across a figure drawing task. However, performance across recognition tasks was below average to average, suggesting some evidence for information consolidation.   Results of emotional screening instruments suggested that recent symptoms of generalized anxiety were in the minimal range, while symptoms of depression were within the mild range. A screening instrument assessing recent sleep quality suggested the presence of minimal sleep dysfunction.  Tables of Scores:   Note: This summary of test scores accompanies the interpretive report and should not be considered in isolation without reference to the appropriate sections in the text. Descriptors are based on appropriate normative data and may be  adjusted based on clinical judgment. Terms such as "Within Normal Limits" and "Outside Normal Limits" are used when a more specific description of the test score cannot be determined.       Percentile - Normative Descriptor > 98 - Exceptionally High 91-97 - Well Above Average 75-90 - Above Average 25-74 - Average 9-24 - Below Average 2-8 - Well Below Average < 2 - Exceptionally Low       Orientation:      Raw Score Percentile   NAB Orientation, Form 1 27/29 --- ---       Cognitive Screening:      Raw Score Percentile   SLUMS: 10/30 --- ---       RBANS, Form A: Standard Score/ Scaled Score Percentile   Total Score 53 <1 Exceptionally Low  Immediate Memory 65 1 Exceptionally Low    List Learning 5 5 Well Below Average    Story Memory 3 1 Exceptionally Low  Visuospatial/Constructional 64 1 Exceptionally Low    Figure Copy 6 9 Below Average    Line Orientation 6/20 <2 Exceptionally Low  Language 64 1 Exceptionally Low    Picture Naming 7/10 <2 Exceptionally Low    Semantic Fluency 4 2 Well Below Average  Attention 53 <1 Exceptionally Low    Digit Span 5 5 Well Below Average    Coding 1 <1 Exceptionally Low  Delayed Memory 64 1 Exceptionally Low    List Recall 0/10 <2 Exceptionally Low    List Recognition 18/20 17-25 Below Average to Average    Story Recall 1 <1 Exceptionally Low    Story Recognition 7/12 7-13 Below Average    Figure Recall 1 <1 Exceptionally Low    Figure Recognition 4/8 21-38 Below Average to Average       Intellectual Functioning:      Standard Score Percentile   Test of Premorbid Functioning: 86 18 Below Average  Attention/Executive Function:     Trail Making Test (TMT): Raw Score (Scaled Score) Percentile     Part A 90 secs.,  1 error (5) 5 Well Below Average    Part B Discontinued --- Impaired  *Based on [employer] Older Normative Studies (MOANS)           Scaled Score Percentile   WAIS-IV Digit Span: 4 2 Well Below Average    Forward 7 16  Below Average    Backward 6 9 Below Average    Sequencing 5 5 Well Below Average        Scaled Score Percentile   WAIS-IV Similarities: 5 5 Well Below Average       D-KEFS Color-Word Interference Test: Raw Score (Scaled Score) Percentile     Color Naming 46 secs. (6) 9 Below Average    Word Reading 31 secs. (8) 25 Average    Inhibition Discontinued --- Impaired    Inhibition/Switching Discontinued --- Impaired       NAB Executive Functions Module, Form 1: T Score Percentile     Judgment 38 12 Below Average       Language:     Verbal Fluency Test: Raw Score (Scaled Score) Percentile     Phonemic Fluency (CFL) 17 (6) 9 Below Average    Category Fluency 21 (5) 5 Well Below Average  *Based on [employer] Older Normative Studies (MOANS)          NAB Language Module, Form 1: T Score Percentile     Auditory Comprehension 39 14 Below Average    Naming 21/31 (26) 1 Exceptionally Low       Visuospatial/Visuoconstruction:      Raw Score Percentile   Clock Drawing: 4/10 --- Impaired       Mood and Personality:      Raw Score Percentile   Geriatric Depression Scale: 11 --- Mild  Geriatric Anxiety Scale: 8 --- Minimal    Somatic 5 --- Minimal    Cognitive 1 --- Minimal    Affective 2 --- Minimal       Additional Questionnaires:      Raw Score Percentile   PROMIS Sleep Disturbance Questionnaire: 13 --- None to Slight   Informed Consent and Coding/Compliance:   The current evaluation represents a clinical evaluation for the purposes previously outlined by the referral source and is in no way reflective of a forensic evaluation.   Ms. Cyrus was provided with a verbal description of the nature and purpose of the present neuropsychological evaluation. Also reviewed were the foreseeable risks and/or discomforts and benefits of the procedure, limits of confidentiality, and mandatory reporting requirements of this provider. The patient was given the opportunity to ask questions and receive  answers about the evaluation. Oral consent to participate was provided by the patient.   This evaluation was conducted by Christia Reading, Ph.D., licensed clinical neuropsychologist. Ms. Guillemette completed a clinical interview with Dr. Melvyn Novas, billed as one unit [phone], and 125 minutes of cognitive testing and scoring, billed as one unit [phone] and three additional units 96139. Psychometrist Cruzita Lederer, B.S., assisted Dr. Melvyn Novas with test administration and scoring procedures. As a separate and discrete service, Dr. Melvyn Novas spent a total of 180 minutes in interpretation and report writing billed as one unit [phone] and two units [id number].

## 2020-09-30 ENCOUNTER — Encounter: Payer: Self-pay | Admitting: Psychology

## 2020-10-01 ENCOUNTER — Other Ambulatory Visit: Payer: Self-pay | Admitting: Physician Assistant

## 2020-10-01 DIAGNOSIS — F330 Major depressive disorder, recurrent, mild: Secondary | ICD-10-CM

## 2020-10-06 ENCOUNTER — Ambulatory Visit (INDEPENDENT_AMBULATORY_CARE_PROVIDER_SITE_OTHER): Payer: Medicare PPO | Admitting: Psychology

## 2020-10-06 ENCOUNTER — Other Ambulatory Visit: Payer: Self-pay

## 2020-10-06 DIAGNOSIS — G20 Parkinson's disease: Secondary | ICD-10-CM | POA: Diagnosis not present

## 2020-10-06 DIAGNOSIS — F0280 Dementia in other diseases classified elsewhere without behavioral disturbance: Secondary | ICD-10-CM | POA: Diagnosis not present

## 2020-10-06 NOTE — Progress Notes (Signed)
   Neuropsychology Feedback Session Jamie [name]. [place] [facility] Department of Neurology  Reason for Referral:   Jamie [name] is a 86 y.o. right-handed Caucasian female referred by Alonza Bogus, D.O., to characterize her current cognitive functioning and assist with diagnostic clarity and treatment planning in the context of Parkinson's disease and concern for progressive cognitive decline.   Feedback:   Jamie [name] completed a comprehensive neuropsychological evaluation on 09/29/2020. Please refer to that encounter for the full report and recommendations. Briefly, results suggested fairly diffuse cognitive impairment with scores commonly in the exceptionally low to well below average normative ranges relative to age-matched peers. Executive functioning and encoding/retrieval aspects of memory represented her most significant impairments. She answered yes/no recognition questions associated with memory tasks reasonably well (i.e., below average to average) and exhibited some variability across processing speed and attention/concentration. The etiology of ongoing cognitive dysfunction is unclear and potentially mixed. Despite concerns surrounding Parkinson's disease, Jamie [name]'s patterns across testing are perhaps more concerning for underlying Alzheimer's disease. Alzheimer's disease and Parkinson's disease can certainly co-occur (i.e., mixed dementia presentation). Or perhaps, if Alzheimer's disease is the true dominant pathology, she may have greater alpha-synuclein involvement relative to typical presentations.  Jamie [name] was accompanied by her daughter in the office and additional family via speakerphone. Content of the current session focused on the results of her neuropsychological evaluation. Jamie [name] and her family were given the opportunity to ask questions and their questions were answered. They were encouraged to reach out should additional questions arise. A copy of her report  was provided at the conclusion of the visit.      40 minutes were spent conducting the current feedback session with Jamie [name], billed as one unit [id number].

## 2020-10-18 ENCOUNTER — Encounter: Payer: Self-pay | Admitting: Physician Assistant

## 2020-10-18 DIAGNOSIS — F330 Major depressive disorder, recurrent, mild: Secondary | ICD-10-CM

## 2020-10-19 MED ORDER — MIRTAZAPINE 7.5 MG PO TABS: 7.5000 mg | ORAL_TABLET | Freq: Every day | ORAL | 0 refills | Status: DC

## 2020-11-04 ENCOUNTER — Encounter: Payer: Self-pay | Admitting: Physician Assistant

## 2020-11-04 NOTE — Progress Notes (Signed)
Jamie [name] Phone: [phone] Subjective:   Jamie [name], am serving as a scribe for Dr. Hulan Saas.  I'm seeing this patient by the request  of:  Lorrene Reid, PA-C  CC: right knee pain   [id number]  08/24/2020 Patient given repeat injection again today.  Tolerated the procedure well.  He does feel that the steroid injection seem to be doing somewhat better than the viscosupplementation but has had some mild improvement with viscosupplementation previously.  Patient would like to see if she can continue to do 10 to 12 weeks before any other injection.  Chronic problem with exacerbation.  Patient is accompanied with family member who also states that there would like to avoid surgical intervention if possible.  Follow-up again in 10 to 12 weeks  Update 11/09/2020 Jamie [name] is a 86 y.o. female coming in with complaint of R knee pain. Patient states that the right knee is starting to remind her that it is there and is ready for an injection.  Knee arthritis noted      Past Medical History:  Diagnosis Date   Abnormality of gait 04/18/2017   Degenerative joint disease of knee, right 03/19/2018   Injected April 18, 2018 Approved for Durolane R knee 3.4.20. Patient given another steroid injection Jun 24, 2018.  Repeat injection September 09, 2018 repeat injection February 27, 2019 repeat 6/110/20221 bilateral injections given December 01, 2019 Right knee injected April 20, 2020 monovisc May 19, 2018 Repeat steroid injection given June 16, 2020 repeat injection August 24, 2020   Essential hypertension 08/14/2016   Generalized anxiety disorder 06/27/2017   History of dizziness 06/27/2017   History of hysterectomy for benign disease 06/27/2017   History of vitamin D deficiency 08/22/2017   Leukocytes in urine 08/14/2016   Lumbar facet arthropathy 06/27/2017   Major neurocognitive disorder due to multiple  etiologies 09/29/2020   Mixed hyperlipidemia 08/22/2017   Osteoarthritis of knee 09/18/2017   Pancolitis 08/14/2016   Parkinson's disease 03/13/2017   Prediabetes 08/22/2017   Right knee pain    Spinal stenosis of lumbar region    Weakness of both hips 04/18/2017   White coat syndrome without diagnosis of hypertension 08/22/2017   Past Surgical History:  Procedure Laterality Date   HYSTERECTOMY ABDOMINAL WITH SALPINGECTOMY     TONSILLECTOMY     Social History   Socioeconomic History   Marital status: Widowed    Spouse name: Not on file   Number of children: 3   Years of education: 12   Highest education level: High school graduate  Occupational History   Not on file  Tobacco Use   Smoking status: Never   Smokeless tobacco: Never  Vaping Use   Vaping Use: Never used  Substance and Sexual Activity   Alcohol use: No    Alcohol/week: 0.0 standard drinks   Drug use: No   Sexual activity: Not Currently  Other Topics Concern   Not on file  Social History Narrative   Pt lives alone in her 1 story home- she has 3 sons, and a high school graduate. She is right handed, she drinks coffee, and one soda a week mostly water   Social Determinants of Health   Financial Resource Strain: Not on file  Food Insecurity: Not on file  Transportation Needs: Not on file  Physical Activity: Not on file  Stress: Not on file  Social Connections: Not on file   Allergies  Allergen Reactions   Flagyl [Metronidazole] Rash    Possible cause of rash    Family History  Problem Relation Age of Onset   Breast cancer Mother    Melanoma Father    Healthy Son      Current Outpatient Medications (Cardiovascular):    rosuvastatin (CRESTOR) 5 MG tablet, TAKE 1 TABLET BY MOUTH AT BEDTIME  Current Outpatient Medications (Respiratory):    saline (AYR) GEL, Place 1 application into both nostrils at bedtime.   Current Outpatient Medications (Hematological):    Cyanocobalamin (VITAMIN B 12 PO),  Take by mouth. 5, 000 mcg daily  Current Outpatient Medications (Other):    carbidopa-levodopa (SINEMET IR) 25-100 MG tablet, Take 2 tablets by mouth 3 (three) times daily.   Coenzyme Q10 (COQ-10) 100 MG CAPS, Take 1 tablet by mouth daily.   escitalopram (LEXAPRO) 10 MG tablet, Take 1 tablet (10 mg total) by mouth daily.   escitalopram (LEXAPRO) 20 MG tablet, TAKE 1 TABLET BY MOUTH DAILY.   Glucosamine-Chondroit-Vit C-Mn (GLUCOSAMINE 1500 COMPLEX PO), Take 1 tablet by mouth daily.   mirtazapine (REMERON) 7.5 MG tablet, Take 1 tablet (7.5 mg total) by mouth at bedtime.   Multiple Vitamin (MULTIVITAMIN WITH MINERALS) TABS tablet, Take 1 tablet by mouth daily.   Multiple Vitamins-Minerals (PRESERVISION AREDS 2 PO), Take 1 tablet by mouth in the morning and at bedtime.    NON FORMULARY, Herblax as needed   NON FORMULARY, Glucose Reduction   NON FORMULARY, Cholesterol Reduction   Vitamin E 100 units TABS, Take 250 mg by mouth. Once a day   Reviewed prior external information including notes and imaging from  primary care provider As well as notes that were available from care everywhere and other healthcare systems.  Past medical history, social, surgical and family history all reviewed in electronic medical record.  No pertanent information unless stated regarding to the chief complaint.   Review of Systems:  No headache, visual changes, nausea, vomiting, diarrhea, constipation, dizziness, abdominal pain, skin rash, fevers, chills, night sweats, weight loss, swollen lymph nodes, body aches, joint swelling, chest pain, shortness of breath, mood changes. POSITIVE muscle aches  Objective  Blood pressure 110/80, pulse 74, height '5\' 4"'$  (1.626 m), weight 160 lb (72.6 kg), SpO2 95 %.   General: No apparent distress alert and oriented x3 mood and affect normal, dressed appropriately.  Mild masked facies noted. HEENT: Pupils equal, extraocular movements intact  Respiratory: Patient's speak in full  sentences and does not appear short of breath  Cardiovascular: No lower extremity edema, non tender, no erythema  MSK: Patient does have a shuffling gait noted. Right knee does have instability noted.  Discussed posture and ergonomics.  Does have with hypertonicity.  After informed written and verbal consent, patient was seated on exam table. Right knee was prepped with alcohol swab and utilizing anterolateral approach, patient's right knee space was injected with 4:1  marcaine 0.5%: Kenalog '40mg'$ /dL. Patient tolerated the procedure well without immediate complications.    Impression and Recommendations:     The above documentation has been reviewed and is accurate and complete Lyndal Pulley, DO

## 2020-11-05 DIAGNOSIS — B353 Tinea pedis: Secondary | ICD-10-CM | POA: Diagnosis not present

## 2020-11-05 DIAGNOSIS — I70203 Unspecified atherosclerosis of native arteries of extremities, bilateral legs: Secondary | ICD-10-CM | POA: Diagnosis not present

## 2020-11-05 DIAGNOSIS — B351 Tinea unguium: Secondary | ICD-10-CM | POA: Diagnosis not present

## 2020-11-09 ENCOUNTER — Other Ambulatory Visit: Payer: Self-pay

## 2020-11-09 ENCOUNTER — Ambulatory Visit: Payer: Medicare PPO | Admitting: Family Medicine

## 2020-11-09 ENCOUNTER — Encounter: Payer: Self-pay | Admitting: Family Medicine

## 2020-11-09 DIAGNOSIS — M1711 Unilateral primary osteoarthritis, right knee: Secondary | ICD-10-CM | POA: Diagnosis not present

## 2020-11-09 NOTE — Assessment & Plan Note (Signed)
Repeat injection given again today.  Tolerated the procedure well, discussed icing regimen and home exercises, increase activity slowly.  In 6 to 8 weeks

## 2020-11-09 NOTE — Patient Instructions (Signed)
Good to see you  Thanks for Halloween joke See me in 10-120 weeks

## 2020-11-24 ENCOUNTER — Other Ambulatory Visit: Payer: Self-pay

## 2020-11-24 ENCOUNTER — Other Ambulatory Visit: Payer: Medicare PPO

## 2020-11-24 DIAGNOSIS — R7303 Prediabetes: Secondary | ICD-10-CM

## 2020-11-24 DIAGNOSIS — E782 Mixed hyperlipidemia: Secondary | ICD-10-CM | POA: Diagnosis not present

## 2020-11-24 DIAGNOSIS — E785 Hyperlipidemia, unspecified: Secondary | ICD-10-CM

## 2020-11-24 DIAGNOSIS — I10 Essential (primary) hypertension: Secondary | ICD-10-CM | POA: Diagnosis not present

## 2020-11-25 LAB — CBC WITH DIFFERENTIAL/PLATELET
Basophils Absolute: 0.1 10*3/uL (ref 0.0–0.2)
Basos: 1 %
EOS (ABSOLUTE): 0.2 10*3/uL (ref 0.0–0.4)
Eos: 2 %
Hematocrit: 40.4 % (ref 34.0–46.6)
Hemoglobin: 13.4 g/dL (ref 11.1–15.9)
Immature Grans (Abs): 0 10*3/uL (ref 0.0–0.1)
Immature Granulocytes: 0 %
Lymphocytes Absolute: 1.8 10*3/uL (ref 0.7–3.1)
Lymphs: 21 %
MCH: 31.3 pg (ref 26.6–33.0)
MCHC: 33.2 g/dL (ref 31.5–35.7)
MCV: 94 fL (ref 79–97)
Monocytes Absolute: 0.8 10*3/uL (ref 0.1–0.9)
Monocytes: 9 %
Neutrophils Absolute: 5.9 10*3/uL (ref 1.4–7.0)
Neutrophils: 67 %
Platelets: 223 10*3/uL (ref 150–450)
RBC: 4.28 x10E6/uL (ref 3.77–5.28)
RDW: 12.2 % (ref 11.7–15.4)
WBC: 8.8 10*3/uL (ref 3.4–10.8)

## 2020-11-25 LAB — COMPREHENSIVE METABOLIC PANEL
ALT: 10 IU/L (ref 0–32)
AST: 21 IU/L (ref 0–40)
Albumin/Globulin Ratio: 2.4 — ABNORMAL HIGH (ref 1.2–2.2)
Albumin: 4.6 g/dL (ref 3.6–4.6)
Alkaline Phosphatase: 92 IU/L (ref 44–121)
BUN/Creatinine Ratio: 28 (ref 12–28)
BUN: 22 mg/dL (ref 8–27)
Bilirubin Total: 0.5 mg/dL (ref 0.0–1.2)
CO2: 22 mmol/L (ref 20–29)
Calcium: 10.9 mg/dL — ABNORMAL HIGH (ref 8.7–10.3)
Chloride: 103 mmol/L (ref 96–106)
Creatinine, Ser: 0.79 mg/dL (ref 0.57–1.00)
Globulin, Total: 1.9 g/dL (ref 1.5–4.5)
Glucose: 107 mg/dL — ABNORMAL HIGH (ref 70–99)
Potassium: 4.2 mmol/L (ref 3.5–5.2)
Sodium: 141 mmol/L (ref 134–144)
Total Protein: 6.5 g/dL (ref 6.0–8.5)
eGFR: 74 mL/min/{1.73_m2} (ref >59)

## 2020-11-25 LAB — LIPID PANEL
Chol/HDL Ratio: 2.3 ratio (ref 0.0–4.4)
Cholesterol, Total: 179 mg/dL (ref 100–199)
HDL: 77 mg/dL (ref >39)
LDL Chol Calc (NIH): 83 mg/dL (ref 0–99)
Triglycerides: 109 mg/dL (ref 0–149)
VLDL Cholesterol Cal: 19 mg/dL (ref 5–40)

## 2020-11-25 LAB — HEMOGLOBIN A1C
Est. average glucose Bld gHb Est-mCnc: 128 mg/dL
Hgb A1c MFr Bld: 6.1 % — ABNORMAL HIGH (ref 4.8–5.6)

## 2020-11-25 LAB — TSH: TSH: 2.85 u[IU]/mL (ref 0.450–4.500)

## 2020-11-29 ENCOUNTER — Ambulatory Visit: Payer: Medicare PPO | Admitting: Physician Assistant

## 2020-11-30 ENCOUNTER — Ambulatory Visit (INDEPENDENT_AMBULATORY_CARE_PROVIDER_SITE_OTHER): Payer: Medicare PPO | Admitting: Physician Assistant

## 2020-11-30 ENCOUNTER — Other Ambulatory Visit: Payer: Self-pay

## 2020-11-30 ENCOUNTER — Encounter: Payer: Self-pay | Admitting: Physician Assistant

## 2020-11-30 VITALS — BP 118/73 | HR 80 | Temp 98.0°F | Ht 64.0 in | Wt 158.4 lb

## 2020-11-30 DIAGNOSIS — E782 Mixed hyperlipidemia: Secondary | ICD-10-CM

## 2020-11-30 DIAGNOSIS — I10 Essential (primary) hypertension: Secondary | ICD-10-CM | POA: Diagnosis not present

## 2020-11-30 DIAGNOSIS — F330 Major depressive disorder, recurrent, mild: Secondary | ICD-10-CM

## 2020-11-30 DIAGNOSIS — M25551 Pain in right hip: Secondary | ICD-10-CM | POA: Diagnosis not present

## 2020-11-30 DIAGNOSIS — R7303 Prediabetes: Secondary | ICD-10-CM

## 2020-11-30 MED ORDER — PREDNISONE 20 MG PO TABS: ORAL_TABLET | ORAL | 0 refills | Status: DC

## 2020-11-30 NOTE — Patient Instructions (Signed)
Hip Pain The hip is the joint between the upper legs and the lower pelvis. The bones, cartilage, tendons, and muscles of your hip joint support your body and allow you to move around. Hip pain can range from a minor ache to severe pain in one or both of your hips. The pain may be felt on the inside of the hip joint near the groin, or on the outside near the buttocks and upper thigh. You may also have swelling or stiffness in your hip area. Follow these instructions at home: Managing pain, stiffness, and swelling   If directed, put ice on the painful area. To do this: Put ice in a plastic bag. Place a towel between your skin and the bag. Leave the ice on for 20 minutes, 2-3 times a day. If directed, apply heat to the affected area as often as told by your health care provider. Use the heat source that your health care provider recommends, such as a moist heat pack or a heating pad. Place a towel between your skin and the heat source. Leave the heat on for 20-30 minutes. Remove the heat if your skin turns bright red. This is especially important if you are unable to feel pain, heat, or cold. You may have a greater risk of getting burned. Activity Do exercises as told by your health care provider. Avoid activities that cause pain. General instructions  Take over-the-counter and prescription medicines only as told by your health care provider. Keep a journal of your symptoms. Write down: How often you have hip pain. The location of your pain. What the pain feels like. What makes the pain worse. Sleep with a pillow between your legs on your most comfortable side. Keep all follow-up visits as told by your health care provider. This is important. Contact a health care provider if: You cannot put weight on your leg. Your pain or swelling continues or gets worse after one week. It gets harder to walk. You have a fever. Get help right away if: You fall. You have a sudden increase in pain and  swelling in your hip. Your hip is red or swollen or very tender to touch. Summary Hip pain can range from a minor ache to severe pain in one or both of your hips. The pain may be felt on the inside of the hip joint near the groin, or on the outside near the buttocks and upper thigh. Avoid activities that cause pain. Write down how often you have hip pain, the location of the pain, what makes it worse, and what it feels like. This information is not intended to replace advice given to you by your health care provider. Make sure you discuss any questions you have with your health care provider. Document Revised: 06/24/2018 Document Reviewed: 06/24/2018 Elsevier Patient Education  2022 Elsevier Inc.  

## 2020-11-30 NOTE — Progress Notes (Signed)
Established Patient Office Visit  Subjective:  Patient ID: Jamie [name], female    DOB: 12-18-1934  Age: 86 y.o. MRN: [id number]  CC:  Chief Complaint  Patient presents with   Hypertension    HPI Jamie [name] presents for follow up on hypertension, hyperlipidemia, mood and discuss most recent labs. Patient has c/o low back pain (right side) x 2 weeks. States her symptoms started after cleaning. Denies recent fall or injury, numbness or tingling sensation down her lower extremity, fever, bowel or bladder dysfunction.   HTN: Pt denies chest pain, palpitations, significant dizziness or lower extremity swelling. Patient managing with diet.  HLD: Pt taking medication as directed without issues.   Mood: Patient was changed from escitalopram to mirtazapine. Tolerating medication without issues. Patient's daughter states has noticed a difference with appetite and is eating more. Patient reports has been more active doing house chores.   Past Medical History:  Diagnosis Date   Abnormality of gait 04/18/2017   Degenerative joint disease of knee, right 03/19/2018   Injected April 18, 2018 Approved for Durolane R knee 3.4.20. Patient given another steroid injection Jun 24, 2018.  Repeat injection September 09, 2018 repeat injection February 27, 2019 repeat 6/110/20221 bilateral injections given December 01, 2019 Right knee injected April 20, 2020 monovisc May 19, 2018 Repeat steroid injection given June 16, 2020 repeat injection August 24, 2020   Essential hypertension 08/14/2016   Generalized anxiety disorder 06/27/2017   History of dizziness 06/27/2017   History of hysterectomy for benign disease 06/27/2017   History of vitamin D deficiency 08/22/2017   Leukocytes in urine 08/14/2016   Lumbar facet arthropathy 06/27/2017   Major neurocognitive disorder due to multiple etiologies 09/29/2020   Mixed hyperlipidemia 08/22/2017   Osteoarthritis of knee 09/18/2017   Pancolitis 08/14/2016    Parkinson's disease 03/13/2017   Prediabetes 08/22/2017   Right knee pain    Spinal stenosis of lumbar region    Weakness of both hips 04/18/2017   White coat syndrome without diagnosis of hypertension 08/22/2017    Past Surgical History:  Procedure Laterality Date   HYSTERECTOMY ABDOMINAL WITH SALPINGECTOMY     TONSILLECTOMY      Family History  Problem Relation Age of Onset   Breast cancer Mother    Melanoma Father    Healthy Son     Social History   Socioeconomic History   Marital status: Widowed    Spouse name: Not on file   Number of children: 3   Years of education: 12   Highest education level: High school graduate  Occupational History   Not on file  Tobacco Use   Smoking status: Never   Smokeless tobacco: Never  Vaping Use   Vaping Use: Never used  Substance and Sexual Activity   Alcohol use: No    Alcohol/week: 0.0 standard drinks   Drug use: No   Sexual activity: Not Currently  Other Topics Concern   Not on file  Social History Narrative   Pt lives alone in her 1 story home- she has 3 sons, and a high school graduate. She is right handed, she drinks coffee, and one soda a week mostly water   Social Determinants of Health   Financial Resource Strain: Not on file  Food Insecurity: Not on file  Transportation Needs: Not on file  Physical Activity: Not on file  Stress: Not on file  Social Connections: Not on file  Intimate Partner Violence: Not on file  Outpatient Medications Prior to Visit  Medication Sig Dispense Refill   carbidopa-levodopa (SINEMET IR) 25-100 MG tablet Take 2 tablets by mouth 3 (three) times daily. 540 tablet 1   Coenzyme Q10 (COQ-10) 100 MG CAPS Take 1 tablet by mouth daily.     Cyanocobalamin (VITAMIN B 12 PO) Take by mouth. 5, 000 mcg daily     Glucosamine-Chondroit-Vit C-Mn (GLUCOSAMINE 1500 COMPLEX PO) Take 1 tablet by mouth daily.     mirtazapine (REMERON) 7.5 MG tablet Take 1 tablet (7.5 mg total) by mouth at bedtime.  30 tablet 0   Multiple Vitamin (MULTIVITAMIN WITH MINERALS) TABS tablet Take 1 tablet by mouth daily.     Multiple Vitamins-Minerals (PRESERVISION AREDS 2 PO) Take 1 tablet by mouth in the morning and at bedtime.      NON FORMULARY Herblax as needed     NON FORMULARY Cholesterol Reduction     rosuvastatin (CRESTOR) 5 MG tablet TAKE 1 TABLET BY MOUTH AT BEDTIME 90 tablet 0   saline (AYR) GEL Place 1 application into both nostrils at bedtime. 14.1 g 0   Vitamin E 100 units TABS Take 250 mg by mouth. Once a day     NON FORMULARY Glucose Reduction (Patient not taking: Reported on 11/30/2020)     escitalopram (LEXAPRO) 10 MG tablet Take 1 tablet (10 mg total) by mouth daily. (Patient not taking: Reported on 11/30/2020) 90 tablet 1   escitalopram (LEXAPRO) 20 MG tablet TAKE 1 TABLET BY MOUTH DAILY. (Patient not taking: Reported on 11/30/2020) 30 tablet 0   No facility-administered medications prior to visit.    Allergies  Allergen Reactions   Flagyl [Metronidazole] Rash    Possible cause of rash     ROS Review of Systems Review of Systems:  A fourteen system review of systems was performed and found to be positive as per HPI.   Objective:    Physical Exam General:  Well Developed, well nourished, appropriate for stated age.  Neuro:  Alert and oriented,  extra-ocular muscles intact  HEENT:  Normocephalic, atraumatic, neck supple, no carotid bruits appreciated  Skin:  no gross rash, warm, pink. Cardiac:  RRR, S1 S2 Respiratory: CTA B/L, Not using accessory muscles, speaking in full sentences- unlabored. MSK: No step-off or deformity noted, no tenderness of midline or SI joints, tenderness of gluteus maximus and lateral upper thigh, pain with flexion of right hip and external rotation Vascular:  Ext warm, no cyanosis apprec.; cap RF less 2 sec. Psych:  No HI/SI, judgement and insight good, Euthymic mood. Full Affect.  BP 118/73   Pulse 80   Temp 98 F (36.7 C)   Ht _0  (1.626 m)    Wt 158 lb 6.4 oz (71.8 kg)   SpO2 99%   BMI 27.19 kg/m  Wt Readings from Last 3 Encounters:  11/30/20 158 lb 6.4 oz (71.8 kg)  11/09/20 160 lb (72.6 kg)  08/24/20 160 lb 12.8 oz (72.9 kg)     Health Maintenance Due  Topic Date Due   DEXA SCAN  Never done   COVID-19 Vaccine (4 - Booster for Pfizer series) 05/15/2020   Zoster Vaccines- Shingrix (2 of 2) 09/21/2020    There are no preventive care reminders to display for this patient.  Lab Results  Component Value Date   TSH 2.850 11/24/2020   Lab Results  Component Value Date   WBC 8.8 11/24/2020   HGB 13.4 11/24/2020   HCT 40.4 11/24/2020   MCV 94 11/24/2020  PLT 223 11/24/2020   Lab Results  Component Value Date   NA 141 11/24/2020   K 4.2 11/24/2020   CO2 22 11/24/2020   GLUCOSE 107 (H) 11/24/2020   BUN 22 11/24/2020   CREATININE 0.79 11/24/2020   BILITOT 0.5 11/24/2020   ALKPHOS 92 11/24/2020   AST 21 11/24/2020   ALT 10 11/24/2020   PROT 6.5 11/24/2020   ALBUMIN 4.6 11/24/2020   CALCIUM 10.9 (H) 11/24/2020   ANIONGAP 7 08/15/2016   EGFR 74 11/24/2020   Lab Results  Component Value Date   CHOL 179 11/24/2020   Lab Results  Component Value Date   HDL 77 11/24/2020   Lab Results  Component Value Date   LDLCALC 83 11/24/2020   Lab Results  Component Value Date   TRIG 109 11/24/2020   Lab Results  Component Value Date   CHOLHDL 2.3 11/24/2020   Lab Results  Component Value Date   HGBA1C 6.1 (H) 11/24/2020   Depression screen PHQ 2/9 11/30/2020 07/27/2020 06/28/2020 12/15/2019 10/01/2019  Decreased Interest 0 - 2 0 0  Down, Depressed, Hopeless 0 0 2 0 0  PHQ - 2 Score 0 0 4 0 0  Altered sleeping 0 0 2 0 0  Tired, decreased energy _0 Change in appetite _1 Feeling bad or failure about yourself  0 0 0 0 0  Trouble concentrating 0 0 0 0 0  Moving slowly or fidgety/restless 0 0 0 0 0  Suicidal thoughts 0 0 0 0 0  PHQ-9 Score _2 Difficult doing work/chores - Somewhat  difficult Somewhat difficult Not difficult at all Not difficult at all  Some recent data might be hidden   GAD 7 : Generalized Anxiety Score 11/30/2020 07/27/2020  Nervous, Anxious, on Edge 0 0  Control/stop worrying 0 0  Worry too much - different things 0 0  Trouble relaxing 0 0  Restless 0 0  Easily annoyed or irritable 0 0  Afraid - awful might happen 0 0  Total GAD 7 Score 0 0  Anxiety Difficulty - Not difficult at all        Assessment & Plan:   Problem List Items Addressed This Visit       Cardiovascular and Mediastinum   Essential hypertension (Chronic)    -Diet controlled. -Discussed recent CMP, renal function norma;/ -Will continue to monitor.        Other   Mixed hyperlipidemia - Primary    -Discussed recent lipid panel which is within normal limits and has improved from prior. -Continue rosuvastatin 5 mg. Recent liver function normal. -Will continue to monitor.      Prediabetes    -Discussed recent A1c which has improved from 6.2 to 6.1, will continue to monitor.      Other Visit Diagnoses     Right hip pain       Relevant Medications   predniSONE (DELTASONE) 20 MG tablet   Hypercalcemia       Mild episode of recurrent major depressive disorder (HCC)          Mild episode of recurrent major depressive disorder: -PHQ-9 score of 2, improved from prior. Patient tolerating mirtazapine 7.5 mg without issues so will continue current medication regimen. -Will continue to monitor.  Right hip pain: -Discussed with patient s/s suggestive of hip tendinopathy vs back/spine etiology and recommend trial of corticosteroid therapy. Discussed potential side effects and advised  to let me know if unable to tolerate medication. Patient verbalized understanding. Recommend to apply heat therapy. If symptoms fail to improve or worsen recommend obtaining imaging studies such as x-ray and/or follow up with Dr. Tamala Julian (Sports Med).  Hypercalcemia: -Discussed recent  calcium,10.9 last calcium 10.2 (wnl's). Patient has a history of elevated calcium. Does take a calcium supplement, unsure of exact dosage and patient's daughter will verify.  -Will repeat calcium at follow up visit. If remains elevated, recommend obtaining additional labs (ionized calcium, PTH, magnesium, Vit D). Recent CBC w/d normal.  Meds ordered this encounter  Medications   predniSONE (DELTASONE) 20 MG tablet    Sig: Take 2 tablets by mouth x 2 days, 1 tablets x 2 days, 0.5 tablet x 2 days    Dispense:  7 tablet    Refill:  0    Order Specific Question:   Supervising Provider    Answer:   Beatrice Lecher D [[id number]]     Follow-up: Return in about 4 months (around 04/02/2021) for Mood, Wt, HTN.    Lorrene Reid, PA-C

## 2020-11-30 NOTE — Assessment & Plan Note (Addendum)
-  Discussed recent A1c which has improved from 6.2 to 6.1, will continue to monitor.

## 2020-11-30 NOTE — Assessment & Plan Note (Signed)
-  Diet controlled. -Discussed recent CMP, renal function norma;/ -Will continue to monitor.

## 2020-11-30 NOTE — Assessment & Plan Note (Signed)
-  Discussed recent lipid panel which is within normal limits and has improved from prior. -Continue rosuvastatin 5 mg. Recent liver function normal. -Will continue to monitor.

## 2020-12-13 ENCOUNTER — Other Ambulatory Visit: Payer: Self-pay | Admitting: Physician Assistant

## 2020-12-13 DIAGNOSIS — F330 Major depressive disorder, recurrent, mild: Secondary | ICD-10-CM

## 2020-12-27 NOTE — Progress Notes (Signed)
Assessment/Plan:   1.  Parkinsons Disease/SWEDD  -she was slower today  -continue carbidopa/levodopa 25/100, 2 tablets 3 times daily.  She looks much better than last visit  2.  Insomnia  -Has received benefit with melatonin, 2.5 mg nightly and will continue that  3.  Depression  -Continue Lexapro, 20 mg daily.  (If no help try mirtazapine as does have trouble with appetite)  -she declines counseling.  Significant other just passed away.  Encouraged her to reconsider.  She met with my LCSW today.  4.  Dementia, mixed type, PDD and AD  -This was identified on neurocognitive testing in August, 2022.   -discussed potential higher level of caregiving.  Discussed alarmed pill box   Subjective:   Jamie [name] was seen today in follow up for Parkinsons disease.  My previous records were reviewed prior to todays visit as well as outside records available to me.  Patient with daughter in law who supplements the history.  One fall about 4 months ago - was getting up from the toilet and fell toward the shower and crawled out and called for help.  Didn't get seriously hurt but did see pcp and was given prednisone.   Levodopa was increased at last visit.  Reports that "its worked real well."  Neurocognitive testing was done by Dr. Melvyn Novas in August, 2022.  She did meet criteria for dementia, likely mixed type, both PD and AD.  Pt still living alone but family comes by 5pm.  They put out the pills for the next day and pt may/may not take them (they will call her though to remind her if they are not taken).  They cook the dinner.    Current prescribed movement disorder medications: Carbidopa/levodopa 25/100, 2 tablets 3 times per day  Melatonin, 3 mg daily Lexapro, 20 mg daily      ALLERGIES:   Allergies  Allergen Reactions   Flagyl [Metronidazole] Rash    Possible cause of rash     CURRENT MEDICATIONS:  Outpatient Encounter Medications as of 12/29/2020  Medication Sig    carbidopa-levodopa (SINEMET IR) 25-100 MG tablet Take 2 tablets by mouth 3 (three) times daily.   Coenzyme Q10 (COQ-10) 100 MG CAPS Take 1 tablet by mouth daily.   mirtazapine (REMERON) 7.5 MG tablet TAKE 1 TABLET (7.5 MG TOTAL) BY MOUTH AT BEDTIME.   Multiple Vitamin (MULTIVITAMIN WITH MINERALS) TABS tablet Take 1 tablet by mouth daily.   Multiple Vitamins-Minerals (PRESERVISION AREDS 2 PO) Take 1 tablet by mouth in the morning and at bedtime.    NON FORMULARY Herblax as needed   NON FORMULARY Cholesterol Reduction   rosuvastatin (CRESTOR) 5 MG tablet TAKE 1 TABLET BY MOUTH AT BEDTIME   Vitamin E 100 units TABS Take 250 mg by mouth. Once a day   [DISCONTINUED] Cyanocobalamin (VITAMIN B 12 PO) Take by mouth. 5, 000 mcg daily   [DISCONTINUED] Glucosamine-Chondroit-Vit C-Mn (GLUCOSAMINE 1500 COMPLEX PO) Take 1 tablet by mouth daily.   [DISCONTINUED] NON FORMULARY Glucose Reduction (Patient not taking: Reported on 11/30/2020)   [DISCONTINUED] predniSONE (DELTASONE) 20 MG tablet Take 2 tablets by mouth x 2 days, 1 tablets x 2 days, 0.5 tablet x 2 days   [DISCONTINUED] saline (AYR) GEL Place 1 application into both nostrils at bedtime.   No facility-administered encounter medications on file as of 12/29/2020.    Objective:   PHYSICAL EXAMINATION:    VITALS:   Vitals:   12/29/20 0941  BP: 122/66  Pulse: 80  SpO2: 96%  Weight: 160 lb 12.8 oz (72.9 kg)  Height: $Remove'5\' 8"'xbUrJhC$  (1.727 m)   Wt Readings from Last 3 Encounters:  12/29/20 160 lb 12.8 oz (72.9 kg)  11/30/20 158 lb 6.4 oz (71.8 kg)  11/09/20 160 lb (72.6 kg)    GEN:  The patient appears stated age and is in NAD. HEENT:  Normocephalic, atraumatic.  The mucous membranes are moist. The superficial temporal arteries are without ropiness or tenderness. CV:  RRR Lungs:  CTAB Neck/HEME:  There are no carotid bruits bilaterally.  Neurological examination:  Orientation: The patient is alert and oriented x3. Cranial nerves: There is good  facial symmetry with significant facial hypomimia. The speech is fluent and clear. Soft palate rises symmetrically and there is no tongue deviation. Hearing is intact to conversational tone. Sensation: Sensation is intact to light touch throughout Motor: Strength is at least antigravity x4.  Movement examination: Tone: There is normal tone in the UE/LE Abnormal movements: None Coordination:  There is no significant slowing with RAMs Gait and Station: The patient pushes off to arise.  She ambulates much better than last visit  I have reviewed and interpreted the following labs independently    Chemistry      Component Value Date/Time   NA 141 11/24/2020 1012   K 4.2 11/24/2020 1012   CL 103 11/24/2020 1012   CO2 22 11/24/2020 1012   BUN 22 11/24/2020 1012   CREATININE 0.79 11/24/2020 1012   GLU 98 05/30/2017 0000      Component Value Date/Time   CALCIUM 10.9 (H) 11/24/2020 1012   ALKPHOS 92 11/24/2020 1012   AST 21 11/24/2020 1012   ALT 10 11/24/2020 1012   BILITOT 0.5 11/24/2020 1012       Lab Results  Component Value Date   WBC 8.8 11/24/2020   HGB 13.4 11/24/2020   HCT 40.4 11/24/2020   MCV 94 11/24/2020   PLT 223 11/24/2020    Lab Results  Component Value Date   TSH 2.850 11/24/2020     Total time spent on today's visit was 20 minutes, including both face-to-face time and nonface-to-face time.  Time included that spent on review of records (prior notes available to me/labs/imaging if pertinent), discussing treatment and goals, answering patient's questions and coordinating care.  Cc:  Lorrene Reid, PA-C

## 2020-12-29 ENCOUNTER — Ambulatory Visit: Payer: Medicare PPO | Admitting: Neurology

## 2020-12-29 ENCOUNTER — Other Ambulatory Visit: Payer: Self-pay

## 2020-12-29 ENCOUNTER — Encounter: Payer: Self-pay | Admitting: Neurology

## 2020-12-29 VITALS — BP 122/66 | HR 80 | Ht 68.0 in | Wt 160.8 lb

## 2020-12-29 DIAGNOSIS — F0280 Dementia in other diseases classified elsewhere without behavioral disturbance: Secondary | ICD-10-CM | POA: Diagnosis not present

## 2020-12-29 DIAGNOSIS — G20 Parkinson's disease: Secondary | ICD-10-CM

## 2020-12-29 DIAGNOSIS — Z5181 Encounter for therapeutic drug level monitoring: Secondary | ICD-10-CM

## 2020-12-29 DIAGNOSIS — G20A1 Parkinson's disease without dyskinesia, without mention of fluctuations: Secondary | ICD-10-CM

## 2020-12-29 MED ORDER — CARBIDOPA-LEVODOPA 25-100 MG PO TABS: 2.0000 | ORAL_TABLET | Freq: Three times a day (TID) | ORAL | 1 refills | Status: DC

## 2020-12-29 NOTE — Patient Instructions (Signed)
Online Resources for Power over Parkinson's Group November 2022  Local [place] Online Groups  Power over [employer] Group :   Power Over Parkinson's Patient Education Group will be Wednesday, November 9th-*Hybrid meting*- in person at [place] location and via [facility] at 2:00 pm.   Upcoming Power over Parkinson's Meetings:  2nd Wednesdays of the month at 2 pm:  November 9th, December 14th [place] at [email or web address] if interested in participating in this online group Parkinson's Care Partners Group:    3rd Mondays, Contact Misty Paladino Atypical Parkinsonian Patient Group:   4th Wednesdays, Contact Misty Paladino If you are interested in participating in these online groups with Misty, please contact her directly for how to join those meetings.  Her contact information is [email or web address].   [place]:  [email or web address] PD Health at Home continues:  Mindfulness Mondays, Expert Briefing Tuesdays, Wellness Wednesdays, Take Time Thursdays, Fitness Fridays -Listings for June 2022 are on the website Upcoming Webinar:  Expert Briefing:  Let's Talk about Dementia.  Wednesday, November 2nd  at 1 pm. Register for Armed forces operational officer) at [email or web address]  Please check out their website to sign up for emails and see their full online offerings  [place]:  [email or web address]  Upcoming Webinar:   2022 in Review:  Progress Toward Better Treatment and Prevention.  Thursday, November 17th at 12 noon Check out additional information on their website to see their full online offerings  [place]:  [email or web address] Upcoming Webinar:  NOH (Neurogenic Orthostatic Hypotension):  What it is, How to Know if you Have it, and How to Manage it.  Tuesday, November 15th at 3 pm.  Webinar Series:  Living with Parkinson's Meetup.    Third Thursdays of each month at 3 pm; next one is November 17th Care Partner Monthly Meetup.  With Robin Searing Phinney.  First Tuesday of each month, 2 pm Joy Breaks:  First Wednesday of each month, 2-3 pm. There will be art, doodling, making, crafting, listening, laughing, stories, and everything in between. No art experience necessary. No supplies required. Just show up for joy!  Register on their website. Check out additional information to Live Well Today on their website  Parkinson and Movement Disorders (PMD) Alliance:  www.[email or web address] [employer]Life Online:  Online Education Events Sign up for emails, which are sent weekly to give you updates on programming and online offerings  Parkinson's Association of the [place]:  www.[email or web address] Information on online support groups, education events, and online exercises including Yoga, Parkinson's exercises and more-LOTS of information on links to PD resources and online events Virtual Support Group through Parkinson's Association of the [place]; next one is scheduled for Wednesday, November 2nd  at 2 pm.  (These are typically scheduled for the 1st Wednesday of the month at 2 pm).  Visit website for details.  Additional links for movement activities: Parkinson's DRUMMING Classes/Music Therapy with Doylene Canning:  This is a returning class and it's FREE!  2nd Mondays, continuing November 14th.  Contact *Misty Taylor-Paladino at [email or web address] or Doylene Canning at [phone] or [email or web address]  PWR! Moves Classes at [place].  Wednesdays 10 and 11 am.  Contact [name] Marriott, PT [email or web address] if interested. Here is a link to the PWR!Moves classes on Zoom from [place] - Daily Mon-Sat at 10:00. Via Zoom, FREE and open to all.  There is also a link below via Facebook if you use that  platform. [email or web address] Parkinson's Wellness Recovery (PWR! Moves)  www.[email or web address] Info on  the PWR! Virtual Experience:  You will have access to our expertise through self-assessment, guided plans that start with the PD-specific fundamentals, educational content, tips, Q&A with an expert, and a growing Art therapist of PD-specific pre-recorded and live exercise classes of varying types and intensity - both physical and cognitive! If that is not enough, we offer 1:1 wellness consultations (in-person or virtual) to personalize your PWR! Research scientist (medical).  [place] Fridays:  As part of the PD Health @ Home program, this free video series focuses each week on one aspect of fitness designed to support people living with Parkinson's.  These weekly videos highlight the [place] recent fitness guidelines for people with Parkinson's disease.  HollywoodSale.dk Dance for PD website is offering free, live-stream classes throughout the week, as well as links to [employer] of classes:  [email or web address]/ Dance for Parkinson's Class:  Rafael Gonzalez.  Free offering for people with Parkinson's and care partners; virtual class.  For more information, contact [phone] or email Ruffin Frederick at [email or web address] Virtual dance and Pilates for Parkinson's classes: Click on the Community Tab> Parkinson's Movement Initiative Tab.  To register for classes and for more information, visit www.[email or web address] and click the "community" tab.  [employer] Parkinson's Cycling Classes  [employer]: 1pm on Fridays-Live classes at [employer] ([employer] at  [email or web address] or [phone]) [employer]: Virtual Classes Mondays and Thursdays Jeanette Caprice classes Tuesday, Wednesday and Thursday (contact [place] at [email or web address]  or [phone]) [place] Varied levels of classes are offered Mondays, Tuesdays and Thursdays at [employer].  To observe a class or for more information, call [phone] or email [email or web address] Well-Spring [employer]: Online Caregiver Education Opportunities:  www.well-[email or web address]/caregiver-education/caregiver-support-group.  You may also contact Vickki Muff at [email or web address] or [phone].   *Multiple opportunities below, as November is Caregiver Month!* A Guide to Physical and Mental Fitness for Family Caregivers.  Wednesday, November 9th, 12:30-2 pm at [facility] of You!  Tuesday, November 15th, 11:30-12:45.  [facility], [place] 08-26-1991 Understanding Care Options and Advanced Care Planning.  Monday, November 21st, 4:30-5:45.  [place], [employer] above to register. Well-Spring Navigator:  [address] program, a free service to help individuals and families through the journey of determining care for older adults.  The "Navigator" is a [employer], Education officer, museum, who will speak with a prospective client and/or loved ones to provide an assessment of the situation and a set of recommendations for a personalized care plan -- all free of charge, and whether Well-Spring [employer] offers the needed service or not. If the need is not a service we provide, we are well-connected with reputable programs in town that we can refer you to.  www.well-springsolutions.org or to speak with the Navigator, call [phone].

## 2021-01-18 ENCOUNTER — Ambulatory Visit: Payer: Medicare PPO | Admitting: Family Medicine

## 2021-01-19 ENCOUNTER — Other Ambulatory Visit: Payer: Self-pay | Admitting: Neurology

## 2021-01-20 NOTE — Progress Notes (Signed)
[place] Phone: [phone] Subjective:   Jamie [name], am serving as a scribe for Dr. Hulan Saas.  This visit occurred during the SARS-CoV-2 public health emergency.  Safety protocols were in place, including screening questions prior to the visit, additional usage of staff PPE, and extensive cleaning of exam room while observing appropriate contact time as indicated for disinfecting solutions.    I'm seeing this patient by the request  of:  Lorrene Reid, PA-C  CC: Right hip and back pain  GBT:[id number]  11/09/2020 Repeat injection given again today.  Tolerated the procedure well, discussed icing regimen and home exercises, increase activity slowly.  In 6 to 8 weeks  Update 01/21/2021 Jamie [name] is a 86 y.o. female coming in with complaint of R knee pain. States her knee pain is tolerable. Patient states that her R glute has been painful for past month from sit to stand. After she starts moving around her pain will improve. Denies any numbness or tingling.      Past Medical History:  Diagnosis Date   Abnormality of gait 04/18/2017   Degenerative joint disease of knee, right 03/19/2018   Injected April 18, 2018 Approved for Durolane R knee 3.4.20. Patient given another steroid injection Jun 24, 2018.  Repeat injection September 09, 2018 repeat injection February 27, 2019 repeat 6/110/20221 bilateral injections given December 01, 2019 Right knee injected April 20, 2020 monovisc May 19, 2018 Repeat steroid injection given June 16, 2020 repeat injection August 24, 2020   Essential hypertension 08/14/2016   Generalized anxiety disorder 06/27/2017   History of dizziness 06/27/2017   History of hysterectomy for benign disease 06/27/2017   History of vitamin D deficiency 08/22/2017   Leukocytes in urine 08/14/2016   Lumbar facet arthropathy 06/27/2017   Major neurocognitive disorder due to multiple etiologies  09/29/2020   Mixed hyperlipidemia 08/22/2017   Osteoarthritis of knee 09/18/2017   Pancolitis 08/14/2016   Parkinson's disease 03/13/2017   Prediabetes 08/22/2017   Right knee pain    Spinal stenosis of lumbar region    Weakness of both hips 04/18/2017   White coat syndrome without diagnosis of hypertension 08/22/2017   Past Surgical History:  Procedure Laterality Date   HYSTERECTOMY ABDOMINAL WITH SALPINGECTOMY     TONSILLECTOMY     Social History   Socioeconomic History   Marital status: Widowed    Spouse name: Not on file   Number of children: 3   Years of education: 12   Highest education level: High school graduate  Occupational History   Not on file  Tobacco Use   Smoking status: Never   Smokeless tobacco: Never  Vaping Use   Vaping Use: Never used  Substance and Sexual Activity   Alcohol use: [name]    Alcohol/week: 0.0 standard drinks   Drug use: [name]   Sexual activity: Not Currently  Other Topics Concern   Not on file  Social History Narrative   Pt lives alone in her 1 story home- she has 3 sons, and a high school graduate. She is right handed, she drinks coffee, and one soda a week mostly water   Social Determinants of Health   Financial Resource Strain: Not on file  Food Insecurity: Not on file  Transportation Needs: Not on file  Physical Activity: Not on file  Stress: Not on file  Social Connections: Not on file   Allergies  Allergen Reactions   Flagyl [Metronidazole]  Rash    Possible cause of rash    Family History  Problem Relation Age of Onset   Breast cancer Mother    Melanoma Father    Healthy Son      Current Outpatient Medications (Cardiovascular):    rosuvastatin (CRESTOR) 5 MG tablet, TAKE 1 TABLET BY MOUTH AT BEDTIME     Current Outpatient Medications (Other):    carbidopa-levodopa (SINEMET IR) 25-100 MG tablet, Take 2 tablets by mouth 3 (three) times daily.   Coenzyme Q10 (COQ-10) 100 MG CAPS, Take 1 tablet by mouth daily.    mirtazapine (REMERON) 7.5 MG tablet, TAKE 1 TABLET (7.5 MG TOTAL) BY MOUTH AT BEDTIME.   Multiple Vitamin (MULTIVITAMIN WITH MINERALS) TABS tablet, Take 1 tablet by mouth daily.   Multiple Vitamins-Minerals (PRESERVISION AREDS 2 PO), Take 1 tablet by mouth in the morning and at bedtime.    NON FORMULARY, Herblax as needed   NON FORMULARY, Cholesterol Reduction   Vitamin E 100 units TABS, Take 250 mg by mouth. Once a day   Reviewed prior external information including notes and imaging from  primary care provider As well as notes that were available from care everywhere and other healthcare systems.  Past medical history, social, surgical and family history all reviewed in electronic medical record.  [name] pertanent information unless stated regarding to the chief complaint.   Review of Systems:  [name] headache, visual changes, nausea, vomiting, diarrhea, constipation, dizziness, abdominal pain, skin rash, fevers, chills, night sweats, weight loss, swollen lymph nodes, body aches, joint swelling, chest pain, shortness of breath, mood changes. POSITIVE muscle aches  Objective  Blood pressure 116/76, pulse 85, height 5\' 8"  (1.727 m), weight 164 lb (74.4 kg), SpO2 99 %.   General: [name] apparent distress alert and oriented x3 mood and affect normal, dressed appropriately.  HEENT: Pupils equal, extraocular movements intact  Respiratory: Patient's speak in full sentences and does not appear short of breath  Cardiovascular: [name] lower extremity edema, non tender, [name] erythema  Gait shuffling gait Patient does have hypertonicity in multiple joints.  Patient is more tender severely over the greater trochanteric area of the left hip.  Patient does have some limited range of motion but very symmetric to the contralateral side.  After verbal consent patient was prepped with alcohol swab and with a 21-gauge 2 inch needle injected with 2 cc of 0.5% Marcaine and 1 cc of Kenalog 40 mg/mL into the right greater  trochanteric area.  [name] blood loss.  Band-Aid placed.  Postinjection instructions given    Impression and Recommendations:     The above documentation has been reviewed and is accurate and complete Lyndal Pulley, DO

## 2021-01-21 ENCOUNTER — Ambulatory Visit (INDEPENDENT_AMBULATORY_CARE_PROVIDER_SITE_OTHER)
Admission: RE | Admit: 2021-01-21 | Discharge: 2021-01-21 | Disposition: A | Discharge status: Home / Self Care | Payer: Medicare PPO | Source: Ambulatory Visit | Attending: Family Medicine | Admitting: Family Medicine

## 2021-01-21 ENCOUNTER — Ambulatory Visit: Payer: Medicare PPO | Admitting: Family Medicine

## 2021-01-21 ENCOUNTER — Encounter: Payer: Self-pay | Admitting: Family Medicine

## 2021-01-21 ENCOUNTER — Other Ambulatory Visit: Payer: Self-pay

## 2021-01-21 VITALS — BP 116/76 | HR 85 | Ht 68.0 in | Wt 164.0 lb

## 2021-01-21 DIAGNOSIS — M545 Low back pain, unspecified: Secondary | ICD-10-CM

## 2021-01-21 DIAGNOSIS — M7061 Trochanteric bursitis, right hip: Secondary | ICD-10-CM

## 2021-01-21 DIAGNOSIS — M5136 Other intervertebral disc degeneration, lumbar region: Secondary | ICD-10-CM | POA: Diagnosis not present

## 2021-01-21 DIAGNOSIS — M48061 Spinal stenosis, lumbar region without neurogenic claudication: Secondary | ICD-10-CM | POA: Diagnosis not present

## 2021-01-21 DIAGNOSIS — M25551 Pain in right hip: Secondary | ICD-10-CM

## 2021-01-21 DIAGNOSIS — M7062 Trochanteric bursitis, left hip: Secondary | ICD-10-CM | POA: Insufficient documentation

## 2021-01-21 NOTE — Assessment & Plan Note (Signed)
Patient given injection today and tolerated the procedure well, discussed icing regimen and home exercises, discussed which activities to do and which ones to avoid.  Increase activity slowly.  Follow-up with me again in 6 to 8 weeks.  Differential includes a lumbar radiculopathy.  Do believe the patient does have increasing tightness secondary to patient's underlying Parkinson's as well as likely compensating for the arthritic changes of the knee

## 2021-01-21 NOTE — Patient Instructions (Signed)
Great to see you  Xrays today  Ice 20 minutes 2 times daily. Usually after activity and before bed. Exercises 3 times a week but be careful  Injected side of hip  See me again in 6 weeks

## 2021-02-04 DIAGNOSIS — B351 Tinea unguium: Secondary | ICD-10-CM | POA: Diagnosis not present

## 2021-02-04 DIAGNOSIS — B353 Tinea pedis: Secondary | ICD-10-CM | POA: Diagnosis not present

## 2021-02-04 DIAGNOSIS — I70203 Unspecified atherosclerosis of native arteries of extremities, bilateral legs: Secondary | ICD-10-CM | POA: Diagnosis not present

## 2021-02-10 ENCOUNTER — Ambulatory Visit
Admission: RE | Admit: 2021-02-10 | Discharge: 2021-02-10 | Disposition: A | Discharge status: Home / Self Care | Payer: Medicare PPO | Source: Ambulatory Visit | Attending: Physician Assistant | Admitting: Physician Assistant

## 2021-02-10 ENCOUNTER — Other Ambulatory Visit: Payer: Self-pay

## 2021-02-10 DIAGNOSIS — Z780 Asymptomatic menopausal state: Secondary | ICD-10-CM

## 2021-02-10 DIAGNOSIS — M810 Age-related osteoporosis without current pathological fracture: Secondary | ICD-10-CM | POA: Diagnosis not present

## 2021-02-10 DIAGNOSIS — E2839 Other primary ovarian failure: Secondary | ICD-10-CM

## 2021-02-10 DIAGNOSIS — M8588 Other specified disorders of bone density and structure, other site: Secondary | ICD-10-CM | POA: Diagnosis not present

## 2021-02-16 ENCOUNTER — Other Ambulatory Visit: Payer: Self-pay | Admitting: Physician Assistant

## 2021-02-16 DIAGNOSIS — F330 Major depressive disorder, recurrent, mild: Secondary | ICD-10-CM

## 2021-03-03 NOTE — Progress Notes (Signed)
Jamie [name] [facility] [address] [place] Phone: [phone] Subjective:   Jamie [name], am serving as a scribe for Dr. Hulan Saas. This visit occurred during the SARS-CoV-2 public health emergency.  Safety protocols were in place, including screening questions prior to the visit, additional usage of staff PPE, and extensive cleaning of exam room while observing appropriate contact time as indicated for disinfecting solutions.   I'm seeing this patient by the request  of:  Lorrene Reid, PA-C  CC: Knee pain, hip pain and back pain follow-up  [id number]  01/21/2021 Patient given injection today and tolerated the procedure well, discussed icing regimen and home exercises, discussed which activities to do and which ones to avoid.  Increase activity slowly.  Follow-up with me again in 6 to 8 weeks.  Differential includes a lumbar radiculopathy.  Do believe the patient does have increasing tightness secondary to patient's underlying Parkinson's as well as likely compensating for the arthritic changes of the knee  Update 03/04/2021 Jamie [name] is a 87 y.o. female coming in with complaint of R hip and lower back pain. Patient states doing better less frequency  in pain and less intensity. Pain in lower back is sporadic. Injection helped. No new complaints.  R hip xray 01/21/2021 IMPRESSION: 1. No acute bony abnormality. 2. Mild heterotopic ossification surrounding the right hip.    Bone Density 02/10/2021 ASSESSMENT: The BMD measured at Femur Neck Right is 0.683 g/cm2 with a T-score of -2.6. This patient is considered osteoporotic according to [facility]) criteria.      Past Medical History:  Diagnosis Date   Abnormality of gait 04/18/2017   Degenerative joint disease of knee, right 03/19/2018   Injected April 18, 2018 Approved for Durolane R knee 3.4.20. Patient given another steroid injection Jun 24, 2018.   Repeat injection September 09, 2018 repeat injection February 27, 2019 repeat 6/110/20221 bilateral injections given December 01, 2019 Right knee injected April 20, 2020 monovisc May 19, 2018 Repeat steroid injection given June 16, 2020 repeat injection August 24, 2020   Essential hypertension 08/14/2016   Generalized anxiety disorder 06/27/2017   History of dizziness 06/27/2017   History of hysterectomy for benign disease 06/27/2017   History of vitamin D deficiency 08/22/2017   Leukocytes in urine 08/14/2016   Lumbar facet arthropathy 06/27/2017   Major neurocognitive disorder due to multiple etiologies 09/29/2020   Mixed hyperlipidemia 08/22/2017   Osteoarthritis of knee 09/18/2017   Pancolitis 08/14/2016   Parkinson's disease 03/13/2017   Prediabetes 08/22/2017   Right knee pain    Spinal stenosis of lumbar region    Weakness of both hips 04/18/2017   White coat syndrome without diagnosis of hypertension 08/22/2017   Past Surgical History:  Procedure Laterality Date   HYSTERECTOMY ABDOMINAL WITH SALPINGECTOMY     TONSILLECTOMY     Social History   Socioeconomic History   Marital status: Widowed    Spouse name: Not on file   Number of children: 3   Years of education: 34   Highest education level: High school graduate  Occupational History   Not on file  Tobacco Use   Smoking status: Never   Smokeless tobacco: Never  Vaping Use   Vaping Use: Never used  Substance and Sexual Activity   Alcohol use: No    Alcohol/week: 0.0 standard drinks   Drug use: No   Sexual activity: Not Currently  Other Topics Concern   Not on file  Social History Narrative   Pt lives alone in her 1 story home- she has 3 sons, and a high Printmaker. She is right handed, she drinks coffee, and one soda a week mostly water   Social Determinants of Health   Financial Resource Strain: Not on file  Food Insecurity: Not on file  Transportation Needs: Not on file  Physical Activity: Not on file   Stress: Not on file  Social Connections: Not on file   Allergies  Allergen Reactions   Flagyl [Metronidazole] Rash    Possible cause of rash    Family History  Problem Relation Age of Onset   Breast cancer Mother    Melanoma Father    Healthy Son      Current Outpatient Medications (Cardiovascular):    rosuvastatin (CRESTOR) 5 MG tablet, TAKE 1 TABLET BY MOUTH AT BEDTIME     Current Outpatient Medications (Other):    carbidopa-levodopa (SINEMET IR) 25-100 MG tablet, Take 2 tablets by mouth 3 (three) times daily.   Coenzyme Q10 (COQ-10) 100 MG CAPS, Take 1 tablet by mouth daily.   mirtazapine (REMERON) 7.5 MG tablet, TAKE 1 TABLET (7.5 MG TOTAL) BY MOUTH AT BEDTIME.   Multiple Vitamin (MULTIVITAMIN WITH MINERALS) TABS tablet, Take 1 tablet by mouth daily.   Multiple Vitamins-Minerals (PRESERVISION AREDS 2 PO), Take 1 tablet by mouth in the morning and at bedtime.    NON FORMULARY, Herblax as needed   NON FORMULARY, Cholesterol Reduction   Vitamin E 100 units TABS, Take 250 mg by mouth. Once a day   Reviewed prior external information including notes and imaging from  primary care provider As well as notes that were available from care everywhere and other healthcare systems.  Reviewed patient's bone density since we have seen patient.  Past medical history, social, surgical and family history all reviewed in electronic medical record.  No pertanent information unless stated regarding to the chief complaint.   Review of Systems:  No headache, visual changes, nausea, vomiting, diarrhea, constipation, dizziness, abdominal pain, skin rash, fevers, chills, night sweats, weight loss, swollen lymph nodes, body aches, joint swelling, chest pain, shortness of breath, mood changes. POSITIVE muscle aches  Objective  Blood pressure 126/84, pulse 74, height 5\' 8"  (1.727 m), weight 160 lb (72.6 kg), SpO2 97 %.   General: No apparent distress alert and oriented x3 mood and affect  normal, dressed appropriately.  Mild masked facies HEENT: Pupils equal, extraocular movements intact  Respiratory: Patient's speak in full sentences and does not appear short of breath  Cardiovascular: No lower extremity edema, non tender, no erythema  Gait mild shuffling gait MSK: Hypertonicity noted but patient is nontender today.  Patient has no pain over the back or the greater trochanteric area.  Right knee does have trace effusion noted.  Instability with valgus and varus force    Impression and Recommendations:     The above documentation has been reviewed and is accurate and complete Lyndal Pulley, DO

## 2021-03-04 ENCOUNTER — Ambulatory Visit: Payer: Medicare PPO | Admitting: Family Medicine

## 2021-03-04 ENCOUNTER — Other Ambulatory Visit: Payer: Self-pay

## 2021-03-04 ENCOUNTER — Encounter: Payer: Self-pay | Admitting: Family Medicine

## 2021-03-04 DIAGNOSIS — Z961 Presence of intraocular lens: Secondary | ICD-10-CM | POA: Diagnosis not present

## 2021-03-04 DIAGNOSIS — M7061 Trochanteric bursitis, right hip: Secondary | ICD-10-CM

## 2021-03-04 DIAGNOSIS — H353132 Nonexudative age-related macular degeneration, bilateral, intermediate dry stage: Secondary | ICD-10-CM | POA: Diagnosis not present

## 2021-03-04 DIAGNOSIS — H52203 Unspecified astigmatism, bilateral: Secondary | ICD-10-CM | POA: Diagnosis not present

## 2021-03-04 DIAGNOSIS — M1711 Unilateral primary osteoarthritis, right knee: Secondary | ICD-10-CM

## 2021-03-04 DIAGNOSIS — H26493 Other secondary cataract, bilateral: Secondary | ICD-10-CM | POA: Diagnosis not present

## 2021-03-04 NOTE — Assessment & Plan Note (Signed)
Significant provement after the injection.  At this point we will have patient continue with conservative therapy and follow-up with me again in 2 months.

## 2021-03-04 NOTE — Patient Instructions (Signed)
So happy you're doing so well Made my year See you again in 2 months

## 2021-03-04 NOTE — Assessment & Plan Note (Signed)
Patient is doing very well with the knee as well.  Seems to be stable.  Responded extremely well to the steroid injection.  Has responded to viscosupplementation as well if necessary.  Continue to keep patient active.  Follow-up with me again in 2 to 3 months

## 2021-04-06 ENCOUNTER — Other Ambulatory Visit: Payer: Self-pay

## 2021-04-06 ENCOUNTER — Ambulatory Visit: Payer: Medicare PPO | Admitting: Physician Assistant

## 2021-04-06 ENCOUNTER — Encounter: Payer: Self-pay | Admitting: Physician Assistant

## 2021-04-06 VITALS — BP 112/74 | HR 80 | Temp 97.3°F | Ht 68.0 in | Wt 159.0 lb

## 2021-04-06 DIAGNOSIS — I10 Essential (primary) hypertension: Secondary | ICD-10-CM

## 2021-04-06 DIAGNOSIS — F411 Generalized anxiety disorder: Secondary | ICD-10-CM

## 2021-04-06 DIAGNOSIS — F330 Major depressive disorder, recurrent, mild: Secondary | ICD-10-CM

## 2021-04-06 DIAGNOSIS — E782 Mixed hyperlipidemia: Secondary | ICD-10-CM | POA: Diagnosis not present

## 2021-04-06 DIAGNOSIS — M810 Age-related osteoporosis without current pathological fracture: Secondary | ICD-10-CM | POA: Diagnosis not present

## 2021-04-06 MED ORDER — MIRTAZAPINE 7.5 MG PO TABS: 7.5000 mg | ORAL_TABLET | Freq: Every day | ORAL | 1 refills | Status: DC

## 2021-04-06 NOTE — Assessment & Plan Note (Signed)
-  Diet controlled. Will continue to monitor. 

## 2021-04-06 NOTE — Assessment & Plan Note (Signed)
-  Last lipid panel wnl's. -Continue current medication regimen. See med list. Follow a heart healthy diet. -Will repeat lipid panel and hepatic function with [facility].

## 2021-04-06 NOTE — Progress Notes (Signed)
Established patient visit   Patient: Jamie [name]   DOB: Mar 06, 1934   87 y.o. Female  MRN: [id number] Visit Date: 04/06/2021  Chief Complaint  Patient presents with   Follow-up    Mood   Hypertension   Subjective    HPI HPI     Follow-up    Additional comments: Mood      Last edited by Aron Baba, CMA on 04/06/2021 10:31 AM.      Patient presents for follow up on hypertension, hyperlipidemia and mood.  HTN: Pt denies chest pain, palpitations, dizziness or lower extremity swelling. Does not check blood pressure at home. Pt monitors sodium intake.  HLD: Pt taking medication as directed without issues. No changes with regular diet.  Mood: Patient was changed from Lexapro to Mirtazapine. Tolerating medication without issues. States medication has helped with appetite. Patient states sometimes has episodes where she feels anxious and gets a little shaky. Denies any known triggers or panic attack.    Depression screen [facility] 2/9 04/06/2021 11/30/2020 07/27/2020 06/28/2020 12/15/2019  Decreased Interest 0 0 - 2 0  Down, Depressed, Hopeless 0 0 0 2 0  PHQ - 2 Score 0 0 0 4 0  Altered sleeping 0 0 0 2 0  Tired, decreased energy 0 1 2 1 3   Change in appetite 0 1 1 1 3   Feeling bad or failure about yourself  1 0 0 0 0  Trouble concentrating 0 0 0 0 0  Moving slowly or fidgety/restless 0 0 0 0 0  Suicidal thoughts 0 0 0 0 0  PHQ-9 Score 1 2 3 8 6   Difficult doing work/chores Not difficult at all - Somewhat difficult Somewhat difficult Not difficult at all  Some recent data might be hidden   GAD 7 : Generalized Anxiety Score 11/30/2020 07/27/2020  Nervous, Anxious, on Edge 0 0  Control/stop worrying 0 0  Worry too much - different things 0 0  Trouble relaxing 0 0  Restless 0 0  Easily annoyed or irritable 0 0  Afraid - awful might happen 0 0  Total GAD 7 Score 0 0  Anxiety Difficulty - Not difficult at all        Medications: Outpatient Medications Prior to Visit   Medication Sig   carbidopa-levodopa (SINEMET IR) 25-100 MG tablet Take 2 tablets by mouth 3 (three) times daily.   Coenzyme Q10 (COQ-10) 100 MG CAPS Take 1 tablet by mouth daily.   Multiple Vitamin (MULTIVITAMIN WITH MINERALS) TABS tablet Take 1 tablet by mouth daily.   Multiple Vitamins-Minerals (PRESERVISION AREDS 2 PO) Take 1 tablet by mouth in the morning and at bedtime.    NON FORMULARY Herblax as needed   NON FORMULARY Cholesterol Reduction   rosuvastatin (CRESTOR) 5 MG tablet TAKE 1 TABLET BY MOUTH AT BEDTIME   Vitamin E 100 units TABS Take 250 mg by mouth. Once a day   [DISCONTINUED] mirtazapine (REMERON) 7.5 MG tablet TAKE 1 TABLET (7.5 MG TOTAL) BY MOUTH AT BEDTIME.   No facility-administered medications prior to visit.    Review of Systems Review of Systems:  A fourteen system review of systems was performed and found to be positive as per HPI.     Objective    BP 112/74    Pulse 80    Temp (!) 97.3 F (36.3 C)    Ht 5\' 8"  (1.727 m)    Wt 159 lb (72.1 kg)    SpO2 98%    BMI  24.18 kg/m  BP Readings from Last 3 Encounters:  04/06/21 112/74  03/04/21 126/84  01/21/21 116/76   Wt Readings from Last 3 Encounters:  04/06/21 159 lb (72.1 kg)  03/04/21 160 lb (72.6 kg)  01/21/21 164 lb (74.4 kg)    Physical Exam  General:  Well Developed, well nourished, appropriate for stated age.  Neuro:  Alert and oriented,  extra-ocular muscles intact  HEENT:  Normocephalic, atraumatic, neck supple  Skin:  no gross rash, warm, pink. Cardiac:  RRR, S1 S2 Respiratory: CTA B/L  Vascular:  Ext warm, no cyanosis apprec.; superficial varicosities noted, no edema  Psych:  No HI/SI, judgement and insight good, Euthymic mood. Full Affect.   No results found for any visits on 04/06/21.  Assessment & Plan      Problem List Items Addressed This Visit       Cardiovascular and Mediastinum   Essential hypertension - Primary (Chronic)    -Diet controlled. -Will continue to monitor.         Musculoskeletal and Integument   Age-related osteoporosis without current pathological fracture    -Discussed bone density results (T-score of -2.6). Patient deferred medication therapy at this time. Discussed calcium and vitamin D supplementation.         Other   Generalized anxiety disorder    -Will continue Mirtazapine 7.5 mg and discussed with patient relaxation techniques. Advised to monitor for worsening anxiety and to let me know, will consider increasing Mirtazapine to 15 mg. Will continue to monitor.      Relevant Medications   mirtazapine (REMERON) 7.5 MG tablet   Mixed hyperlipidemia    -Last lipid panel wnl's. -Continue current medication regimen. See med list. Follow a heart healthy diet. -Will repeat lipid panel and hepatic function with MCW.       Mild episode of recurrent major depressive disorder (HCC)    -PHQ-9 score of 1. Stable. -Continue current medication regimen. See med list. -Will continue to monitor.      Relevant Medications   mirtazapine (REMERON) 7.5 MG tablet    Return in about 4 months (around 08/04/2021) for MCW and FBW.        Lorrene Reid, PA-C  [facility] at [facility] [phone] (phone) [phone] (fax)  [place]

## 2021-04-06 NOTE — Assessment & Plan Note (Signed)
-  Discussed bone density results (T-score of -2.6). Patient deferred medication therapy at this time. Discussed calcium and vitamin D supplementation.

## 2021-04-06 NOTE — Assessment & Plan Note (Signed)
-  PHQ-9 score of 1. Stable. -Continue current medication regimen. See med list. -Will continue to monitor.

## 2021-04-06 NOTE — Assessment & Plan Note (Signed)
-  Will continue Mirtazapine 7.5 mg and discussed with patient relaxation techniques. Advised to monitor for worsening anxiety and to let me know, will consider increasing Mirtazapine to 15 mg. Will continue to monitor.

## 2021-04-29 NOTE — Progress Notes (Unsigned)
[place] Phone: [phone] Subjective:    I'm seeing this patient by the request  of:  Lorrene Reid, PA-C  CC:   [id number]  03/04/2021 Patient is doing very well with the knee as well.  Seems to be stable.  Responded extremely well to the steroid injection.  Has responded to viscosupplementation as well if necessary.  Continue to keep patient active.  Follow-up with me again in 2 to 3 months  Significant provement after the injection.  At this point we will have patient continue with conservative therapy and follow-up with me again in 2 months.  Update 05/03/2021 Jamie [name] is a 87 y.o. female coming in with complaint of R knee and R hip pain. Patient states        Past Medical History:  Diagnosis Date   Abnormality of gait 04/18/2017   Degenerative joint disease of knee, right 03/19/2018   Injected April 18, 2018 Approved for Durolane R knee 3.4.20. Patient given another steroid injection Jun 24, 2018.  Repeat injection September 09, 2018 repeat injection February 27, 2019 repeat 6/110/20221 bilateral injections given December 01, 2019 Right knee injected April 20, 2020 monovisc May 19, 2018 Repeat steroid injection given June 16, 2020 repeat injection August 24, 2020   Essential hypertension 08/14/2016   Generalized anxiety disorder 06/27/2017   History of dizziness 06/27/2017   History of hysterectomy for benign disease 06/27/2017   History of vitamin D deficiency 08/22/2017   Leukocytes in urine 08/14/2016   Lumbar facet arthropathy 06/27/2017   Major neurocognitive disorder due to multiple etiologies 09/29/2020   Mixed hyperlipidemia 08/22/2017   Osteoarthritis of knee 09/18/2017   Pancolitis 08/14/2016   Parkinson's disease 03/13/2017   Prediabetes 08/22/2017   Right knee pain    Spinal stenosis of lumbar region    Weakness of both hips 04/18/2017   White coat syndrome without diagnosis of  hypertension 08/22/2017   Past Surgical History:  Procedure Laterality Date   HYSTERECTOMY ABDOMINAL WITH SALPINGECTOMY     TONSILLECTOMY     Social History   Socioeconomic History   Marital status: Widowed    Spouse name: Not on file   Number of children: 3   Years of education: 12   Highest education level: High school graduate  Occupational History   Not on file  Tobacco Use   Smoking status: Never   Smokeless tobacco: Never  Vaping Use   Vaping Use: Never used  Substance and Sexual Activity   Alcohol use: No    Alcohol/week: 0.0 standard drinks   Drug use: No   Sexual activity: Not Currently  Other Topics Concern   Not on file  Social History Narrative   Pt lives alone in her 1 story home- she has 3 sons, and a high school graduate. She is right handed, she drinks coffee, and one soda a week mostly water   Social Determinants of Health   Financial Resource Strain: Not on file  Food Insecurity: Not on file  Transportation Needs: Not on file  Physical Activity: Not on file  Stress: Not on file  Social Connections: Not on file   Allergies  Allergen Reactions   Flagyl [Metronidazole] Rash    Possible cause of rash    Family History  Problem Relation Age of Onset   Breast cancer Mother    Melanoma Father    Healthy Son      Current Outpatient Medications (  Cardiovascular):    rosuvastatin (CRESTOR) 5 MG tablet, TAKE 1 TABLET BY MOUTH AT BEDTIME     Current Outpatient Medications (Other):    carbidopa-levodopa (SINEMET IR) 25-100 MG tablet, Take 2 tablets by mouth 3 (three) times daily.   Coenzyme Q10 (COQ-10) 100 MG CAPS, Take 1 tablet by mouth daily.   mirtazapine (REMERON) 7.5 MG tablet, Take 1 tablet (7.5 mg total) by mouth at bedtime.   Multiple Vitamin (MULTIVITAMIN WITH MINERALS) TABS tablet, Take 1 tablet by mouth daily.   Multiple Vitamins-Minerals (PRESERVISION AREDS 2 PO), Take 1 tablet by mouth in the morning and at bedtime.    NON FORMULARY,  Herblax as needed   NON FORMULARY, Cholesterol Reduction   Vitamin E 100 units TABS, Take 250 mg by mouth. Once a day   Reviewed prior external information including notes and imaging from  primary care provider As well as notes that were available from care everywhere and other healthcare systems.  Past medical history, social, surgical and family history all reviewed in electronic medical record.  No pertanent information unless stated regarding to the chief complaint.   Review of Systems:  No headache, visual changes, nausea, vomiting, diarrhea, constipation, dizziness, abdominal pain, skin rash, fevers, chills, night sweats, weight loss, swollen lymph nodes, body aches, joint swelling, chest pain, shortness of breath, mood changes. POSITIVE muscle aches  Objective  There were no vitals taken for this visit.   General: No apparent distress alert and oriented x3 mood and affect normal, dressed appropriately.  HEENT: Pupils equal, extraocular movements intact  Respiratory: Patient's speak in full sentences and does not appear short of breath  Cardiovascular: No lower extremity edema, non tender, no erythema  Gait normal with good balance and coordination.  MSK:  Non tender with full range of motion and good stability and symmetric strength and tone of shoulders, elbows, wrist, hip, knee and ankles bilaterally.     Impression and Recommendations:     The above documentation has been reviewed and is accurate and complete [name]

## 2021-05-03 ENCOUNTER — Encounter: Payer: Self-pay | Admitting: Family Medicine

## 2021-05-03 ENCOUNTER — Ambulatory Visit: Payer: Medicare PPO | Admitting: Family Medicine

## 2021-05-03 ENCOUNTER — Other Ambulatory Visit: Payer: Self-pay

## 2021-05-03 DIAGNOSIS — M1711 Unilateral primary osteoarthritis, right knee: Secondary | ICD-10-CM | POA: Diagnosis not present

## 2021-05-03 DIAGNOSIS — M7061 Trochanteric bursitis, right hip: Secondary | ICD-10-CM

## 2021-05-03 NOTE — Assessment & Plan Note (Signed)
Injection given today.  Tolerated the procedure very well.  Discussed icing regimen and home exercise.  Increase activity slowly.  Follow-up again in 6 to 8 weeks.  Differential also includes multiple lumbar radiculopathy. ?

## 2021-05-03 NOTE — Assessment & Plan Note (Signed)
Known arthritic changes but patient wants to hold on any other injection.  Doing relatively well.  No other change in management. ?

## 2021-05-03 NOTE — Patient Instructions (Signed)
Injected GT today ?Stay active  ?See me again in 3 months ?

## 2021-05-06 DIAGNOSIS — I70203 Unspecified atherosclerosis of native arteries of extremities, bilateral legs: Secondary | ICD-10-CM | POA: Diagnosis not present

## 2021-05-06 DIAGNOSIS — B351 Tinea unguium: Secondary | ICD-10-CM | POA: Diagnosis not present

## 2021-05-06 DIAGNOSIS — B353 Tinea pedis: Secondary | ICD-10-CM | POA: Diagnosis not present

## 2021-06-07 ENCOUNTER — Encounter: Payer: Self-pay | Admitting: Physician Assistant

## 2021-06-07 ENCOUNTER — Ambulatory Visit (INDEPENDENT_AMBULATORY_CARE_PROVIDER_SITE_OTHER): Payer: Medicare PPO | Admitting: Physician Assistant

## 2021-06-07 VITALS — BP 126/82 | HR 76 | Temp 97.5°F | Ht 68.0 in | Wt 166.0 lb

## 2021-06-07 DIAGNOSIS — N6321 Unspecified lump in the left breast, upper outer quadrant: Secondary | ICD-10-CM | POA: Diagnosis not present

## 2021-06-07 NOTE — Progress Notes (Signed)
?  Established patient acute visit ? ? ?Patient: Jamie [name]   DOB: 1934-10-05   87 y.o. Female  MRN: [id number] ?Visit Date: 06/07/2021 ? ?Chief Complaint  ?Patient presents with  ? Acute Visit  ? ?Subjective  ?  ?HPI  ?Patient presents with c/o left breast mass which she has noticed for a while (several months) and thought it would go away on its own but it has not. Reports it is tender if she presses on it. Denies changes in size, erythema, drainage or nipple inversion. No fever, night sweats, or unintentional weight loss.  ? ? ? ?Medications: ?Outpatient Medications Prior to Visit  ?Medication Sig  ? carbidopa-levodopa (SINEMET IR) 25-100 MG tablet Take 2 tablets by mouth 3 (three) times daily.  ? Coenzyme Q10 (COQ-10) 100 MG CAPS Take 1 tablet by mouth daily.  ? mirtazapine (REMERON) 7.5 MG tablet Take 1 tablet (7.5 mg total) by mouth at bedtime.  ? Multiple Vitamin (MULTIVITAMIN WITH MINERALS) TABS tablet Take 1 tablet by mouth daily.  ? Multiple Vitamins-Minerals (PRESERVISION AREDS 2 PO) Take 1 tablet by mouth in the morning and at bedtime.   ? NON FORMULARY Herblax as needed  ? NON FORMULARY Cholesterol Reduction  ? rosuvastatin (CRESTOR) 5 MG tablet TAKE 1 TABLET BY MOUTH AT BEDTIME  ? Vitamin E 100 units TABS Take 250 mg by mouth. Once a day  ? ?No facility-administered medications prior to visit.  ? ? ?Review of Systems ? ? ?  Objective  ?  ?BP 126/82   Pulse 76   Temp (!) 97.5 ?F (36.4 ?C)   Ht '5\' 8"'$  (1.727 m)   Wt 166 lb (75.3 kg)   SpO2 98%   BMI 25.24 kg/m?  ? ? ?Physical Exam  ?General:  Well Developed, well nourished, appropriate for stated age.  ?Neuro:  Alert and oriented,  extra-ocular muscles intact  ?HEENT:  Normocephalic, atraumatic, neck supple  ?Skin:  no gross rash, warm, pink. ?Breast: small mobile and tender mass noted at left upper outer quadrant, firm tissue noted at left lower quadrant, no axillary adenopathy appreciated, no dimpling skin or nipple inversion of bilateral  breasts,   ?Respiratory: Speaking in full sentences, unlabored. ?Vascular:  Ext warm, no cyanosis apprec.; cap RF less 2 sec. ?Psych:  No HI/SI, judgement and insight good, Euthymic mood. Full Affect. ? ? ?No results found for any visits on 06/07/21. ? Assessment & Plan  ?  ? ?Patient presenting with tender left breast mass. Will place order for diagnostic mammogram for further evaluation. ? ? ?Return if symptoms worsen or fail to improve.  ?   ? ? ? ?Lorrene Reid, PA-C  ?[facility] [facility] ?[phone] (phone) ?[phone] (fax) ? ?[facility] ?

## 2021-06-13 ENCOUNTER — Telehealth: Payer: Self-pay | Admitting: Physician Assistant

## 2021-06-13 DIAGNOSIS — N6321 Unspecified lump in the left breast, upper outer quadrant: Secondary | ICD-10-CM

## 2021-06-13 DIAGNOSIS — Z1239 Encounter for other screening for malignant neoplasm of breast: Secondary | ICD-10-CM

## 2021-06-13 NOTE — Telephone Encounter (Signed)
Can you please change her mammogram order [facility] instead of [facility]? ?

## 2021-06-13 NOTE — Telephone Encounter (Signed)
Where has the imaging been done previously? What location? ?

## 2021-06-13 NOTE — Telephone Encounter (Signed)
[facility] for their imaging.  ? ?The order is correct. They can call at [phone] and follow the prompts to the breast center to schedule. AS, CMA ?

## 2021-06-13 NOTE — Telephone Encounter (Signed)
Done. AS, CMA °

## 2021-06-22 DIAGNOSIS — L600 Ingrowing nail: Secondary | ICD-10-CM | POA: Diagnosis not present

## 2021-06-22 DIAGNOSIS — B351 Tinea unguium: Secondary | ICD-10-CM | POA: Diagnosis not present

## 2021-06-22 DIAGNOSIS — B353 Tinea pedis: Secondary | ICD-10-CM | POA: Diagnosis not present

## 2021-06-22 DIAGNOSIS — I70203 Unspecified atherosclerosis of native arteries of extremities, bilateral legs: Secondary | ICD-10-CM | POA: Diagnosis not present

## 2021-06-27 DIAGNOSIS — R928 Other abnormal and inconclusive findings on diagnostic imaging of breast: Secondary | ICD-10-CM | POA: Diagnosis not present

## 2021-06-27 DIAGNOSIS — N644 Mastodynia: Secondary | ICD-10-CM | POA: Diagnosis not present

## 2021-07-01 NOTE — Progress Notes (Signed)
? ? ?Assessment/Plan:  ? ?1.  Parkinsons Disease/SWEDD ? -Discussed newer skin biopsies for alpha-synuclein.  Not needed right now ? -continue carbidopa/levodopa 25/100, 2 tablets 3 times daily.   ? ?2.  Insomnia ? -Has received benefit with melatonin, 2.5 mg nightly and will continue that ? ?3.  Depression ? -Continue Lexapro, 20 mg daily.  (If no help try mirtazapine as does have trouble with appetite) ? ?4.  Dementia, mixed type, PDD and AD ? -This was identified on neurocognitive testing in August, 2022.  ? -discussed level of care ?5.  Tender left breast mass, per PCP notes last month ? -awaiting biopsy due to abnormal mammogram ?6.  Urinary incontinence at night ? -refer to urology - Dr. Zigmund Daniel.  ? ?Subjective:  ? ?Jamie [name] was seen today in follow up for Parkinsons disease.  My previous records were reviewed prior to todays visit as well as outside records available to me.  Patient with daughter in law who supplements the history.  Patient is following with Dr. Tamala Julian for hip bursitis.  Noting that she went to primary care about a month ago with what is described as tender left breast mass.  Diagnostic mammogram ordered.  Daughter states that it was abnormal and she is scheduled for bx tomorrow.   In regards to Parkinson's, the patient is actually doing fairly well.  No falls.  No near syncopal episodes.  Last visit, her mood was depressed because of having just lost her significant other.  She states today that she is doing okay and managing.  She is having urinary incontinence at night.   ? ?Current prescribed movement disorder medications: ?Carbidopa/levodopa 25/100, 2 tablets 3 times per day  ?Melatonin, 3 mg daily ?Lexapro, 20 mg daily  ? ? ? ? ?ALLERGIES:   ?Allergies  ?Allergen Reactions  ? Flagyl [Metronidazole] Rash  ?  Possible cause of rash   ? ? ?CURRENT MEDICATIONS:  ?Outpatient Encounter Medications as of 07/05/2021  ?Medication Sig  ? carbidopa-levodopa (SINEMET IR) 25-100 MG tablet  Take 2 tablets by mouth 3 (three) times daily.  ? Coenzyme Q10 (COQ-10) 100 MG CAPS Take 1 tablet by mouth daily.  ? mirtazapine (REMERON) 7.5 MG tablet Take 1 tablet (7.5 mg total) by mouth at bedtime.  ? Multiple Vitamin (MULTIVITAMIN WITH MINERALS) TABS tablet Take 1 tablet by mouth daily.  ? Multiple Vitamins-Minerals (PRESERVISION AREDS 2 PO) Take 1 tablet by mouth in the morning and at bedtime.   ? NON FORMULARY Herblax as needed  ? NON FORMULARY Cholesterol Reduction  ? Vitamin E 100 units TABS Take 250 mg by mouth. Once a day  ? [DISCONTINUED] rosuvastatin (CRESTOR) 5 MG tablet TAKE 1 TABLET BY MOUTH AT BEDTIME  ? ?No facility-administered encounter medications on file as of 07/05/2021.  ? ? ?Objective:  ? ?PHYSICAL EXAMINATION:   ? ?VITALS:   ?There were no vitals filed for this visit. ? ?Wt Readings from Last 3 Encounters:  ?06/07/21 166 lb (75.3 kg)  ?05/03/21 166 lb (75.3 kg)  ?04/06/21 159 lb (72.1 kg)  ? ? ?GEN:  The patient appears stated age and is in NAD. ?HEENT:  Normocephalic, atraumatic.  The mucous membranes are moist.  ? ?Neurological examination: ? ?Orientation: The patient is alert and oriented x3. ?Cranial nerves: There is good facial symmetry with significant facial hypomimia. The speech is fluent and clear. Soft palate rises symmetrically and there is no tongue deviation. Hearing is intact to conversational tone. ?Sensation: Sensation is intact  to light touch throughout ?Motor: Strength is at least antigravity x4. ? ?Movement examination: ?Tone: There is normal tone in the UE/LE ?Abnormal movements: None ?Coordination:  There is decremation on the L ?Gait and Station: The patient pushes off to arise.  She ambulates fairly well in the hall - no assistive device ? ?I have reviewed and interpreted the following labs independently ? ?  Chemistry   ?   ?Component Value Date/Time  ? NA 141 11/24/2020 1012  ? K 4.2 11/24/2020 1012  ? CL 103 11/24/2020 1012  ? CO2 22 11/24/2020 1012  ? BUN 22  11/24/2020 1012  ? CREATININE 0.79 11/24/2020 1012  ? GLU 98 05/30/2017 0000  ?    ?Component Value Date/Time  ? CALCIUM 10.9 (H) 11/24/2020 1012  ? ALKPHOS 92 11/24/2020 1012  ? AST 21 11/24/2020 1012  ? ALT 10 11/24/2020 1012  ? BILITOT 0.5 11/24/2020 1012  ?  ? ? ? ?Lab Results  ?Component Value Date  ? WBC 8.8 11/24/2020  ? HGB 13.4 11/24/2020  ? HCT 40.4 11/24/2020  ? MCV 94 11/24/2020  ? PLT 223 11/24/2020  ? ? ?Lab Results  ?Component Value Date  ? TSH 2.850 11/24/2020  ? ? ? ?Total time spent on today's visit was 23 minutes, including both face-to-face time and nonface-to-face time.  Time included that spent on review of records (prior notes available to me/labs/imaging if pertinent), discussing treatment and goals, answering patient's questions and coordinating care. ? ?Cc:  Lorrene Reid, PA-C ? ?

## 2021-07-04 ENCOUNTER — Other Ambulatory Visit: Payer: Self-pay | Admitting: Physician Assistant

## 2021-07-05 ENCOUNTER — Encounter: Payer: Self-pay | Admitting: Neurology

## 2021-07-05 ENCOUNTER — Telehealth: Payer: Self-pay

## 2021-07-05 ENCOUNTER — Other Ambulatory Visit: Payer: Self-pay

## 2021-07-05 ENCOUNTER — Ambulatory Visit: Payer: Medicare PPO | Admitting: Neurology

## 2021-07-05 VITALS — BP 124/78 | HR 76 | Ht 68.0 in | Wt 170.2 lb

## 2021-07-05 DIAGNOSIS — G20 Parkinson's disease: Secondary | ICD-10-CM | POA: Diagnosis not present

## 2021-07-05 DIAGNOSIS — R32 Unspecified urinary incontinence: Secondary | ICD-10-CM

## 2021-07-05 DIAGNOSIS — N3944 Nocturnal enuresis: Secondary | ICD-10-CM

## 2021-07-05 MED ORDER — CARBIDOPA-LEVODOPA 25-100 MG PO TABS
2.0000 | ORAL_TABLET | Freq: Three times a day (TID) | ORAL | 2 refills | Status: DC
Start: 2021-07-05 — End: 2022-01-10

## 2021-07-05 NOTE — Patient Instructions (Signed)
Local and Online Resources for Power over Parkinson's Group ?May 2023 ? ?LOCAL [place] PARKINSON'S GROUPS  ?Power over Parkinson's Group:   ?Power Over Parkinson's Patient Education Group will be Wednesday, May 10th-*Hybrid meting*- in person at [facility] location and via [facility] at 2:00 pm.   ?Upcoming Power over Parkinson's Meetings:  2nd Wednesdays of the month at 2 pm:  May 10th, June 14th, July 12th ?Contact [name] Marriott at [email or web address] if interested in participating in this group ?Parkinson's Care Partners Group:    3rd Mondays, Contact Misty Paladino ?Atypical Parkinsonian Patient Group:   4th Wednesdays, Contact Misty Paladino ?If you are interested in participating in these groups with Misty, please contact her directly for how to join those meetings.  Her contact information is [email or web address].   ? ?LOCAL EVENTS AND NEW OFFERINGS ?Moving Day [place]:  Saturday, May 6th, 9:30 am at [place], [place], [place]. Participate in Moving Day as a way to ?honor loved ones, raise funds, fight Parkinson's disease, and celebrate movement.?  Register today at [email or web address] ?[place]!  Play [place]!  Join [place] for home game for a fun evening to bring awareness of Parkinson's and raise funds for our Movement Disorder Funds. Rescheduled to May 11th  6:30 pm [place]. To purchase tickets:  [email or web address] ?Parkinson's T-shirts for sale!  Designed by a local group member, with funds going to [place].  $25.00  Contact Misty to purchase  ?New PWR! Moves [employer] Instructor-Led Class offering at [employer]!  Wednesdays 1-2 pm, starting April 12th.   Contact Bryson Dames, Acupuncturist at [employer].  Manuela [email or web address] ? ?ONLINE EDUCATION AND SUPPORT ?[place]:  www.parkinson.org ?PD Health at Home continues:  Mindfulness  Mondays, Wellness Wednesdays, Fitness Fridays  ?Upcoming Education:  ?Understanding Gene and Cell-Based Therapies in Parkinson's.  Wednesday, May 10th at 1:00 pm ?Additional Education offerings virtually through their website-upcoming topics include Palliative Care/Hospice and PD, Sleep and PD ?Register for expert briefings Cytogeneticist) at [email or web address].si ?Please check out their website to sign up for emails and see their full online offerings ? ? ?[place]:  [email or web address]  ?Third Thursday Webinars:  On the third Thursday of every month at 12 p.m. ET, join our free live webinars to learn about various aspects of living with Parkinson's disease and our work to speed medical breakthroughs. ?Upcoming Webinar: Get Moving: Exercising for a Healthy Brain.  Thursday, May 18th  at  12 noon. ?Check out additional information on their website to see their full online offerings ? ?[place]:  www.[email or web address] ?Upcoming Webinar:   Stay tuned ?Webinar Series:  Living with Parkinson's Meetup.   Third Thursdays each month, 3 pm ?Care Partner Monthly Meetup.  With Robin Searing Phinney.  First Tuesday of each month, 2 pm ?Check out additional information to Live Well Today on their website ? ?Parkinson and Movement Disorders (PMD) Alliance:  www.pmdalliance.org ?[employer] Online:  Online Education Events ?Sign up for emails, which are sent weekly to give you updates on programming and online offerings ? ?Parkinson's Association of [place]:  www.parkinsonassociation.org ?Information on online support groups, education events, and online exercises including Yoga, Parkinson's exercises and more-LOTS of information on links to PD resources and online events ?Virtual Support Group through [employer] of the [place]; next one is scheduled for Wednesday, May 3rd at 2 pm. (These are typically  scheduled for the 1st Wednesday of the month at 2 pm).  Visit website for details. ?MOVEMENT AND  EXERCISE OPPORTUNITIES ?Parkinson's DRUMMING Classes/Music Therapy with Doylene Canning:  This is a returning class and it's FREE!  2nd Mondays, continuing May 8th, 11:00 at the [facility].  Contact *Misty Taylor-Paladino at [email or web address] or Doylene Canning at [phone] or [email or web address]  ?PWR! Moves Classes at [place].  Wednesdays 10 and 11 am.   Contact [name] Marriott, PT [email or web address] if interested. ?NEW PWR! Moves Class offering at [employer].  Wednesdays 1-2 pm, starting April 12th.  Contact Bryson Dames, Acupuncturist at [employer].  [email or web address] [email or web address] ?Here is a link to the PWR!Moves classes on Zoom from [place] - Daily Mon-Sat at 10:00. Via Zoom, FREE and open to all.  There is also a link below via Facebook if you use that platform. ? ?[email or web address].si ?https://www.PrepaidParty.no ? ?Parkinson's Wellness Recovery (PWR! Moves)  www.pwr4life.org ?Info on the PWR! Virtual Experience:  You will have access to our expertise through self-assessment, guided plans that start with the PD-specific fundamentals, educational content, tips, Q&A with an expert, and a growing Art therapist of PD-specific pre-recorded and live exercise classes of varying types and intensity - both physical and cognitive! If that is not enough, we offer 1:1 wellness consultations (in-person or virtual) to personalize your PWR! Research scientist (medical).  ?[employer] Fridays:  ?As part of the PD Health @ Home program, this free video series focuses each week on one aspect of fitness designed to support people living with  Parkinson's.  These weekly videos highlight the [place] recent fitness guidelines for people with Parkinson's disease. ?www.KVTVnet.com.cy ?Dance for PD website is offering free, live-stream classes throughout the week, as well as links to [employer] of classes:  https://danceforparkinsons.org/ ?Virtual dance and Pilates for Parkinson's classes: Click on the Community Tab> Parkinson's Movement Initiative Tab.  To register for classes and for more information, visit www.SeekAlumni.co.za and click the ?community? tab.  ?[employer] Parkinson's Cycling Classes  ?[employer]:  Thursdays @ Noon-Live classes at [employer] ([employer] at [email or web address] or [phone]) ?[employer]: Virtual Classes Mondays and Thursdays Jeanette Caprice classes Tuesday, Wednesday and Thursday (contact [place] at [email or web address]  or [phone]) ?[employer] ?Varied levels of classes are offered Mondays, Tuesdays and Thursdays at [employer].  ?Stretching with Verdis Frederickson weekly class is also offered for people with Parkinson's ?To observe a class or for more information, call [phone] or email Hezzie Bump at [email or web address] .[email or web address] ?ADDITIONAL SUPPORT AND RESOURCES ?[employer]:Online Caregiver Education Opportunities:  www.well-springsolutions.org/caregiver-education/caregiver-support-group.  You may also contact Vickki Muff at [email or web address] or [phone].    ?Well-Spring Navigator:  Just1Navigator program, a free service to help individuals and families through the journey of determining care for older adults.  The ?Navigator? is a [phone], Education officer, museum, who will speak with a prospective client and/or loved ones to provide an assessment of the situation and a set of recommendations for a personalized care plan -- all free of charge, and whether [employer]  offers the needed service or not. If the need is not a service we provide, we are well-connected with reputable programs in town that we can refer you to.  www.well-springsolutions.org or to speak with the Navigator,

## 2021-07-05 NOTE — Telephone Encounter (Signed)
Referral sent to Dr. [place] Pink  ?

## 2021-07-06 ENCOUNTER — Encounter: Payer: Self-pay | Admitting: Physician Assistant

## 2021-07-06 DIAGNOSIS — C50912 Malignant neoplasm of unspecified site of left female breast: Secondary | ICD-10-CM | POA: Diagnosis not present

## 2021-07-06 DIAGNOSIS — Z170 Estrogen receptor positive status [ER+]: Secondary | ICD-10-CM | POA: Diagnosis not present

## 2021-07-06 DIAGNOSIS — N6321 Unspecified lump in the left breast, upper outer quadrant: Secondary | ICD-10-CM | POA: Diagnosis not present

## 2021-07-06 DIAGNOSIS — C50312 Malignant neoplasm of lower-inner quadrant of left female breast: Secondary | ICD-10-CM | POA: Diagnosis not present

## 2021-07-06 DIAGNOSIS — C773 Secondary and unspecified malignant neoplasm of axilla and upper limb lymph nodes: Secondary | ICD-10-CM | POA: Diagnosis not present

## 2021-07-07 ENCOUNTER — Encounter: Payer: Self-pay | Admitting: Neurology

## 2021-07-08 ENCOUNTER — Telehealth: Payer: Self-pay | Admitting: Hematology and Oncology

## 2021-07-08 ENCOUNTER — Encounter: Payer: Self-pay | Admitting: Physician Assistant

## 2021-07-08 NOTE — Telephone Encounter (Signed)
Spoke to [place], patient's daughter in law to confirm morning clinic appointment for 5/24, sent her a copy of the paperwork via e-mail

## 2021-07-11 ENCOUNTER — Encounter: Payer: Self-pay | Admitting: *Deleted

## 2021-07-11 DIAGNOSIS — Z170 Estrogen receptor positive status [ER+]: Secondary | ICD-10-CM | POA: Insufficient documentation

## 2021-07-11 DIAGNOSIS — C50312 Malignant neoplasm of lower-inner quadrant of left female breast: Secondary | ICD-10-CM

## 2021-07-12 ENCOUNTER — Encounter: Payer: Self-pay | Admitting: *Deleted

## 2021-07-12 ENCOUNTER — Encounter: Payer: Self-pay | Admitting: Physician Assistant

## 2021-07-12 NOTE — Progress Notes (Signed)
Radiation Oncology         [phone] ________________________________  Initial Outpatient Consultation  Name: Jamie [name] MRN: [id number]  Date: 07/13/2021  DOB: 1934-10-11  JI:RCVELF, Herb Grays, PA-C  Coralie Keens, MD   REFERRING PHYSICIAN: Coralie Keens, MD  DIAGNOSIS: No diagnosis found.  Stage *** Left Breast LIQ *** Carcinoma ***, ER+ / PR*** / Her2***, Grade ***  CHIEF COMPLAINT: Here to discuss management of left breast cancer  HISTORY OF PRESENT ILLNESS::Jamie [name] is a 87 y.o. female who presented with a palpable lower inner left breast lump and intermittent pain across the upper quadrants of the left breast.  Subsequent diagnostic bilateral mammogram on 06/27/21 for evaluation revealed an indeterminate enlarged lymph node in the left axilla and a partially visualized density in the lower inner left breast. Left breast ultrasound on that same date further revealed a 3.9 cm mass in the 7 o'clock left breast suspicious for malignancy, correlating with the palpable site of concern. Sonogram also revealed a possible 1.3 cm lobulated mass in the 1 o'clock left breast suspicious of malignancy, and again showed a suspicious left axillary lymph node.   Biopsy on date of *** showed ***.  ER status: ***; PR status ***, Her2 status ***; Grade ***.  ***  PREVIOUS RADIATION THERAPY: {EXAM; YES/NO:19492::"No"}  PAST MEDICAL HISTORY:  has a past medical history of Abnormality of gait (04/18/2017), Degenerative joint disease of knee, right (03/19/2018), Essential hypertension (08/14/2016), Generalized anxiety disorder (06/27/2017), History of dizziness (06/27/2017), History of hysterectomy for benign disease (06/27/2017), History of vitamin D deficiency (08/22/2017), Leukocytes in urine (08/14/2016), Lumbar facet arthropathy (06/27/2017), Major neurocognitive disorder due to multiple etiologies (09/29/2020), Mixed hyperlipidemia (08/22/2017), Osteoarthritis of knee (09/18/2017),  Pancolitis (08/14/2016), Parkinson's disease (03/13/2017), Prediabetes (08/22/2017), Right knee pain, Spinal stenosis of lumbar region, Weakness of both hips (04/18/2017), and White coat syndrome without diagnosis of hypertension (08/22/2017).    PAST SURGICAL HISTORY: Past Surgical History:  Procedure Laterality Date   HYSTERECTOMY ABDOMINAL WITH SALPINGECTOMY     TONSILLECTOMY      FAMILY HISTORY: family history includes Breast cancer in her mother; Healthy in her son; Melanoma in her father.  SOCIAL HISTORY:  reports that she has never smoked. She has never used smokeless tobacco. She reports that she does not drink alcohol and does not use drugs.  ALLERGIES: Flagyl [metronidazole]  MEDICATIONS:  Current Outpatient Medications  Medication Sig Dispense Refill   carbidopa-levodopa (SINEMET IR) 25-100 MG tablet Take 2 tablets by mouth 3 (three) times daily. 540 tablet 2   Coenzyme Q10 (COQ-10) 100 MG CAPS Take 1 tablet by mouth daily.     mirtazapine (REMERON) 7.5 MG tablet Take 1 tablet (7.5 mg total) by mouth at bedtime. 90 tablet 1   Multiple Vitamin (MULTIVITAMIN WITH MINERALS) TABS tablet Take 1 tablet by mouth daily.     Multiple Vitamins-Minerals (PRESERVISION AREDS 2 PO) Take 1 tablet by mouth in the morning and at bedtime.      NON FORMULARY Herblax as needed     NON FORMULARY Cholesterol Reduction     rosuvastatin (CRESTOR) 5 MG tablet TAKE 1 TABLET BY MOUTH AT BEDTIME 90 tablet 1   Vitamin E 100 units TABS Take 250 mg by mouth. Once a day     No current facility-administered medications for this encounter.    REVIEW OF SYSTEMS: As above in HPI.   PHYSICAL EXAM:  vitals were not taken for this visit.   General: Alert and oriented, in no  acute distress HEENT: Head is normocephalic. Extraocular movements are intact. Oropharynx is clear. Neck: Neck is supple, no palpable cervical or supraclavicular lymphadenopathy. Heart: Regular in rate and rhythm with no murmurs, rubs,  or gallops. Chest: Clear to auscultation bilaterally, with no rhonchi, wheezes, or rales. Abdomen: Soft, nontender, nondistended, with no rigidity or guarding. Extremities: No cyanosis or edema. Lymphatics: see Neck Exam Skin: No concerning lesions. Musculoskeletal: symmetric strength and muscle tone throughout. Neurologic: Cranial nerves II through XII are grossly intact. No obvious focalities. Speech is fluent. Coordination is intact. Psychiatric: Judgment and insight are intact. Affect is appropriate. Breasts: *** . No other palpable masses appreciated in the breasts or axillae *** .    ECOG = ***  0 - Asymptomatic (Fully active, able to carry on all predisease activities without restriction)  1 - Symptomatic but completely ambulatory (Restricted in physically strenuous activity but ambulatory and able to carry out work of a light or sedentary nature. For example, light housework, office work)  2 - Symptomatic, <50% in bed during the day (Ambulatory and capable of all self care but unable to carry out any work activities. Up and about more than 50% of waking hours)  3 - Symptomatic, >50% in bed, but not bedbound (Capable of only limited self-care, confined to bed or chair 50% or more of waking hours)  4 - Bedbound (Completely disabled. Cannot carry on any self-care. Totally confined to bed or chair)  5 - Death   Eustace Pen MM, Creech RH, Tormey DC, et al. [phone]). "Toxicity and response criteria of the [facility]". [place] Oncol. 5 (6): 649-55   LABORATORY DATA:  Lab Results  Component Value Date   WBC 8.8 11/24/2020   HGB 13.4 11/24/2020   HCT 40.4 11/24/2020   MCV 94 11/24/2020   PLT 223 11/24/2020   CMP     Component Value Date/Time   NA 141 11/24/2020 1012   K 4.2 11/24/2020 1012   CL 103 11/24/2020 1012   CO2 22 11/24/2020 1012   GLUCOSE 107 (H) 11/24/2020 1012   GLUCOSE 86 08/15/2016 0721   BUN 22 11/24/2020 1012   CREATININE 0.79  11/24/2020 1012   CALCIUM 10.9 (H) 11/24/2020 1012   PROT 6.5 11/24/2020 1012   ALBUMIN 4.6 11/24/2020 1012   AST 21 11/24/2020 1012   ALT 10 11/24/2020 1012   ALKPHOS 92 11/24/2020 1012   BILITOT 0.5 11/24/2020 1012   GFRNONAA 74 12/05/2019 0842   GFRAA 85 12/05/2019 0842         RADIOGRAPHY: No results found.    IMPRESSION/PLAN: ***   It was a pleasure meeting the patient today. We discussed the risks, benefits, and side effects of radiotherapy. I recommend radiotherapy to the *** to reduce her risk of locoregional recurrence by 2/3.  We discussed that radiation would take approximately *** weeks to complete and that I would give the patient a few weeks to heal following surgery before starting treatment planning. *** If chemotherapy were to be given, this would precede radiotherapy. We spoke about acute effects including skin irritation and fatigue as well as much less common late effects including internal organ injury or irritation. We spoke about the latest technology that is used to minimize the risk of late effects for patients undergoing radiotherapy to the breast or chest wall. No guarantees of treatment were given. The patient is enthusiastic about proceeding with treatment. I look forward to participating in the patient's care.  I will  await her referral back to me for postoperative follow-up and eventual CT simulation/treatment planning.  On date of service, in total, I spent *** minutes on this encounter. Patient was seen in person.   __________________________________________   Eppie Gibson, MD  This document serves as a record of services personally performed by Eppie Gibson, MD. It was created on her behalf by Roney Mans, a trained medical scribe. The creation of this record is based on the scribe's personal observations and the provider's statements to them. This document has been checked and approved by the attending provider.

## 2021-07-12 NOTE — Progress Notes (Signed)
[facility] NOTE  Patient Care Team: Lorrene Reid, PA-C as PCP - General Wynonia Lawman Grace Bushy, MD as Consulting Physician (Cardiology) Sydnee Levans, MD as Referring Physician (Dermatology) Murphy Tat, Eustace Quail, DO as Consulting Physician (Neurology) Mauro Kaufmann, RN as Oncology Nurse Navigator Rockwell Germany, RN as Oncology Nurse Navigator Coralie Keens, MD as Consulting Physician (General Surgery) Nicholas Lose, MD as Consulting Physician (Hematology and Oncology) Eppie Gibson, MD as Attending Physician (Radiation Oncology)  CHIEF COMPLAINTS/PURPOSE OF CONSULTATION:  Newly diagnosed breast cancer  HISTORY OF PRESENTING ILLNESS:  Jamie [name] 87 y.o. female is here because of recent diagnosis of left breast Screening detected Left breast lump lower inner quadrant Diagnostic mammogram showed the 3.9 cm x 1.6 x 3.4 cm hard palpable area in the left breast at 7 o clock is suspicious of malignancy. a possible 1.2 cm x 1 cm x 1.3 cm lobulated mass like area with an indistinct margin in the left breast at 1 o clock anterior depth 2 cm from nipple. The lobulated mass is a mixed echogenicity. Biopsy showed: .Prognostic indicators significant for ER 100% positive; PR 100% positive, Her2 status (3+); KI67: 40%Grade 2.    I reviewed her records extensively and collaborated the history with the patient.  SUMMARY OF ONCOLOGIC HISTORY: Oncology History   No history exists.     MEDICAL HISTORY:  Past Medical History:  Diagnosis Date   Abnormality of gait 04/18/2017   Degenerative joint disease of knee, right 03/19/2018   Injected April 18, 2018 Approved for Durolane R knee 3.4.20. Patient given another steroid injection Jun 24, 2018.  Repeat injection September 09, 2018 repeat injection February 27, 2019 repeat 6/110/20221 bilateral injections given December 01, 2019 Right knee injected April 20, 2020 monovisc May 19, 2018 Repeat steroid  injection given June 16, 2020 repeat injection August 24, 2020   Essential hypertension 08/14/2016   Generalized anxiety disorder 06/27/2017   History of dizziness 06/27/2017   History of hysterectomy for benign disease 06/27/2017   History of vitamin D deficiency 08/22/2017   Leukocytes in urine 08/14/2016   Lumbar facet arthropathy 06/27/2017   Major neurocognitive disorder due to multiple etiologies 09/29/2020   Mixed hyperlipidemia 08/22/2017   Osteoarthritis of knee 09/18/2017   Pancolitis 08/14/2016   Parkinson's disease 03/13/2017   Prediabetes 08/22/2017   Right knee pain    Spinal stenosis of lumbar region    Weakness of both hips 04/18/2017   White coat syndrome without diagnosis of hypertension 08/22/2017    SURGICAL HISTORY: Past Surgical History:  Procedure Laterality Date   HYSTERECTOMY ABDOMINAL WITH SALPINGECTOMY     TONSILLECTOMY      SOCIAL HISTORY: Social History   Socioeconomic History   Marital status: Widowed    Spouse name: Not on file   Number of children: 3   Years of education: 12   Highest education level: High school graduate  Occupational History   Not on file  Tobacco Use   Smoking status: Never   Smokeless tobacco: Never  Vaping Use   Vaping Use: Never used  Substance and Sexual Activity   Alcohol use: No    Alcohol/week: 0.0 standard drinks   Drug use: No   Sexual activity: Not Currently  Other Topics Concern   Not on file  Social History Narrative   Pt lives alone in her 1 story home- she has 3 sons, and a high school graduate. She is right handed, she drinks coffee,  and one soda a week mostly water   Social Determinants of Health   Financial Resource Strain: Not on file  Food Insecurity: Not on file  Transportation Needs: Not on file  Physical Activity: Not on file  Stress: Not on file  Social Connections: Not on file  Intimate Partner Violence: Not on file    FAMILY HISTORY: Family History  Problem Relation Age of Onset    Breast cancer Mother    Melanoma Father    Healthy Son     ALLERGIES:  is allergic to flagyl [metronidazole].  MEDICATIONS:  Current Outpatient Medications  Medication Sig Dispense Refill   carbidopa-levodopa (SINEMET IR) 25-100 MG tablet Take 2 tablets by mouth 3 (three) times daily. 540 tablet 2   Coenzyme Q10 (COQ-10) 100 MG CAPS Take 1 tablet by mouth daily.     mirtazapine (REMERON) 7.5 MG tablet Take 1 tablet (7.5 mg total) by mouth at bedtime. 90 tablet 1   Multiple Vitamin (MULTIVITAMIN WITH MINERALS) TABS tablet Take 1 tablet by mouth daily.     Multiple Vitamins-Minerals (PRESERVISION AREDS 2 PO) Take 1 tablet by mouth in the morning and at bedtime.      NON FORMULARY Herblax as needed     NON FORMULARY Cholesterol Reduction     rosuvastatin (CRESTOR) 5 MG tablet TAKE 1 TABLET BY MOUTH AT BEDTIME 90 tablet 1   Vitamin E 100 units TABS Take 250 mg by mouth. Once a day     No current facility-administered medications for this visit.    REVIEW OF SYSTEMS:   Constitutional: Denies fevers, chills or abnormal night sweats Eyes: Denies blurriness of vision, double vision or watery eyes Ears, nose, mouth, throat, and face: Denies mucositis or sore throat Respiratory: Denies cough, dyspnea or wheezes Cardiovascular: Denies palpitation, chest discomfort or lower extremity swelling Gastrointestinal:  Denies nausea, heartburn or change in bowel habits Skin: Denies abnormal skin rashes Lymphatics: Denies new lymphadenopathy or easy bruising Neurological:Denies numbness, tingling or new weaknesses Behavioral/Psych: Mood is stable, no new changes  Breast: *** Denies any palpable lumps or discharge All other systems were reviewed with the patient and are negative.  PHYSICAL EXAMINATION: ECOG PERFORMANCE STATUS: {CHL ONC ECOG PS:[id number]}  There were no vitals filed for this visit. There were no vitals filed for this visit.  GENERAL:alert, no distress and comfortable SKIN:  skin color, texture, turgor are normal, no rashes or significant lesions EYES: normal, conjunctiva are pink and non-injected, sclera clear OROPHARYNX:no exudate, no erythema and lips, buccal mucosa, and tongue normal  NECK: supple, thyroid normal size, non-tender, without nodularity LYMPH:  no palpable lymphadenopathy in the cervical, axillary or inguinal LUNGS: clear to auscultation and percussion with normal breathing effort HEART: regular rate & rhythm and no murmurs and no lower extremity edema ABDOMEN:abdomen soft, non-tender and normal bowel sounds Musculoskeletal:no cyanosis of digits and no clubbing  PSYCH: alert & oriented x 3 with fluent speech NEURO: no focal motor/sensory deficits BREAST:*** No palpable nodules in breast. No palpable axillary or supraclavicular lymphadenopathy (exam performed in the presence of a chaperone)   LABORATORY DATA:  I have reviewed the data as listed Lab Results  Component Value Date   WBC 8.8 11/24/2020   HGB 13.4 11/24/2020   HCT 40.4 11/24/2020   MCV 94 11/24/2020   PLT 223 11/24/2020   Lab Results  Component Value Date   NA 141 11/24/2020   K 4.2 11/24/2020   CL 103 11/24/2020   CO2 22 11/24/2020  RADIOGRAPHIC STUDIES: I have personally reviewed the radiological reports and agreed with the findings in the report.  ASSESSMENT AND PLAN:  No problem-specific Assessment & Plan notes found for this encounter.   All questions were answered. The patient knows to call the clinic with any problems, questions or concerns.    Suzzette Righter, CMA 07/12/21  I Gardiner Coins am scribing for Dr. Lindi Adie  ***

## 2021-07-13 ENCOUNTER — Other Ambulatory Visit: Payer: Self-pay | Admitting: Surgery

## 2021-07-13 ENCOUNTER — Inpatient Hospital Stay: Payer: Medicare PPO

## 2021-07-13 ENCOUNTER — Encounter: Payer: Self-pay | Admitting: Physical Therapy

## 2021-07-13 ENCOUNTER — Inpatient Hospital Stay (HOSPITAL_BASED_OUTPATIENT_CLINIC_OR_DEPARTMENT_OTHER): Payer: Medicare PPO | Admitting: Genetic Counselor

## 2021-07-13 ENCOUNTER — Inpatient Hospital Stay: Payer: Medicare PPO | Attending: Hematology and Oncology | Admitting: Hematology and Oncology

## 2021-07-13 ENCOUNTER — Encounter: Payer: Self-pay | Admitting: *Deleted

## 2021-07-13 ENCOUNTER — Ambulatory Visit
Admission: RE | Admit: 2021-07-13 | Discharge: 2021-07-13 | Disposition: A | Discharge status: Home / Self Care | Payer: Medicare PPO | Source: Ambulatory Visit | Attending: Radiation Oncology | Admitting: Radiation Oncology

## 2021-07-13 ENCOUNTER — Other Ambulatory Visit: Payer: Self-pay

## 2021-07-13 ENCOUNTER — Ambulatory Visit: Payer: Medicare PPO | Attending: Surgery | Admitting: Physical Therapy

## 2021-07-13 ENCOUNTER — Encounter: Payer: Self-pay | Admitting: Radiation Oncology

## 2021-07-13 ENCOUNTER — Inpatient Hospital Stay: Payer: Medicare PPO | Admitting: Licensed Clinical Social Worker

## 2021-07-13 DIAGNOSIS — C50312 Malignant neoplasm of lower-inner quadrant of left female breast: Secondary | ICD-10-CM | POA: Insufficient documentation

## 2021-07-13 DIAGNOSIS — R293 Abnormal posture: Secondary | ICD-10-CM | POA: Diagnosis not present

## 2021-07-13 DIAGNOSIS — Z853 Personal history of malignant neoplasm of breast: Secondary | ICD-10-CM

## 2021-07-13 DIAGNOSIS — C773 Secondary and unspecified malignant neoplasm of axilla and upper limb lymph nodes: Secondary | ICD-10-CM | POA: Diagnosis not present

## 2021-07-13 DIAGNOSIS — C50912 Malignant neoplasm of unspecified site of left female breast: Secondary | ICD-10-CM | POA: Diagnosis not present

## 2021-07-13 DIAGNOSIS — Z803 Family history of malignant neoplasm of breast: Secondary | ICD-10-CM

## 2021-07-13 DIAGNOSIS — Z170 Estrogen receptor positive status [ER+]: Secondary | ICD-10-CM | POA: Insufficient documentation

## 2021-07-13 LAB — CBC WITH DIFFERENTIAL (CANCER CENTER ONLY)
Abs Immature Granulocytes: 0.02 10*3/uL (ref 0.00–0.07)
Basophils Absolute: 0.1 10*3/uL (ref 0.0–0.1)
Basophils Relative: 1 %
Eosinophils Absolute: 0.2 10*3/uL (ref 0.0–0.5)
Eosinophils Relative: 2 %
HCT: 40.5 % (ref 36.0–46.0)
Hemoglobin: 13.3 g/dL (ref 12.0–15.0)
Immature Granulocytes: 0 %
Lymphocytes Relative: 21 %
Lymphs Abs: 1.5 10*3/uL (ref 0.7–4.0)
MCH: 30.9 pg (ref 26.0–34.0)
MCHC: 32.8 g/dL (ref 30.0–36.0)
MCV: 94 fL (ref 80.0–100.0)
Monocytes Absolute: 0.8 10*3/uL (ref 0.1–1.0)
Monocytes Relative: 11 %
Neutro Abs: 4.5 10*3/uL (ref 1.7–7.7)
Neutrophils Relative %: 65 %
Platelet Count: 208 10*3/uL (ref 150–400)
RBC: 4.31 MIL/uL (ref 3.87–5.11)
RDW: 13.4 % (ref 11.5–15.5)
WBC Count: 7 10*3/uL (ref 4.0–10.5)
nRBC: 0 % (ref 0.0–0.2)

## 2021-07-13 LAB — CMP (CANCER CENTER ONLY)
ALT: 18 U/L (ref 0–44)
AST: 18 U/L (ref 15–41)
Albumin: 4.4 g/dL (ref 3.5–5.0)
Alkaline Phosphatase: 81 U/L (ref 38–126)
Anion gap: 5 (ref 5–15)
BUN: 21 mg/dL (ref 8–23)
CO2: 28 mmol/L (ref 22–32)
Calcium: 11 mg/dL — ABNORMAL HIGH (ref 8.9–10.3)
Chloride: 107 mmol/L (ref 98–111)
Creatinine: 0.86 mg/dL (ref 0.44–1.00)
GFR, Estimated: >60 mL/min (ref >60)
Glucose, Bld: 126 mg/dL — ABNORMAL HIGH (ref 70–99)
Potassium: 4 mmol/L (ref 3.5–5.1)
Sodium: 140 mmol/L (ref 135–145)
Total Bilirubin: 0.6 mg/dL (ref 0.3–1.2)
Total Protein: 7.3 g/dL (ref 6.5–8.1)

## 2021-07-13 LAB — GENETIC SCREENING ORDER

## 2021-07-13 NOTE — Progress Notes (Unsigned)
REFERRING PROVIDER: Serena Croissant, MD [address] [place], [place] [address]  PRIMARY PROVIDER:  Mayer Masker, PA-C  PRIMARY REASON FOR VISIT:  Encounter Diagnoses  Name Primary?   Malignant neoplasm of lower-inner quadrant of left breast in female, estrogen receptor positive (HCC) Yes   Family history of breast cancer     HISTORY OF PRESENT ILLNESS:   Jamie [name], a 87 y.o. female, was seen for a [place] cancer genetics consultation at the request of Dr. Pamelia Hoit due to a personal and family history of cancer.  Jamie [name] presents to clinic today to discuss the possibility of a hereditary predisposition to cancer, to discuss genetic testing, and to further clarify her future cancer risks, as well as potential cancer risks for family members.   In May 2023, at the age of 47, Jamie [name] was diagnosed with invasive ductal carcinoma of the left breast.    CANCER HISTORY:  Oncology History  Malignant neoplasm of lower-inner quadrant of left breast in female, estrogen receptor positive (HCC)  07/11/2021 Initial Diagnosis   Skin changes in the left breast, screening mammogram revealed thickening of the lower medial quadrant along with enlarged lymph node in the axilla (2 additional lymph nodes) by ultrasound breast tumor measured 3.9 cm, biopsy: Grade 2 IDC with lymphovascular invasion ER 100%, PR 100%, HER2 positive Ki-67 40%, lymph node biopsy positive   07/13/2021 Cancer Staging   Staging form: Breast, AJCC 8th Edition - Clinical: Stage IB (cT2, cN1, cM0, G2, ER+, PR+, HER2+) - Signed by Serena Croissant, MD on 07/13/2021 Stage prefix: Initial diagnosis Histologic grading system: 3 grade system       RISK FACTORS:  Menarche was at age 4.  First live birth at age 66.  OCP use for approximately 0 years.  Uterus intact: no.  Menopausal status: postmenopausal.  HRT use: 0 years. Colonoscopy: yes Mammogram within the last year: yes. Any excessive radiation exposure in the  past: no  Past Medical History:  Diagnosis Date   Abnormality of gait 04/18/2017   Degenerative joint disease of knee, right 03/19/2018   Injected April 18, 2018 Approved for Durolane R knee 3.4.20. Patient given another steroid injection Jun 24, 2018.  Repeat injection September 09, 2018 repeat injection February 27, 2019 repeat 6/110/20221 bilateral injections given December 01, 2019 Right knee injected April 20, 2020 monovisc May 19, 2018 Repeat steroid injection given June 16, 2020 repeat injection August 24, 2020   Essential hypertension 08/14/2016   Generalized anxiety disorder 06/27/2017   History of dizziness 06/27/2017   History of hysterectomy for benign disease 06/27/2017   History of vitamin D deficiency 08/22/2017   Leukocytes in urine 08/14/2016   Lumbar facet arthropathy 06/27/2017   Major neurocognitive disorder due to multiple etiologies 09/29/2020   Mixed hyperlipidemia 08/22/2017   Osteoarthritis of knee 09/18/2017   Pancolitis 08/14/2016   Parkinson's disease 03/13/2017   Prediabetes 08/22/2017   Right knee pain    Spinal stenosis of lumbar region    Weakness of both hips 04/18/2017   White coat syndrome without diagnosis of hypertension 08/22/2017    Past Surgical History:  Procedure Laterality Date   HYSTERECTOMY ABDOMINAL WITH SALPINGECTOMY     TONSILLECTOMY      Social History   Socioeconomic History   Marital status: Widowed    Spouse name: Not on file   Number of children: 3   Years of education: 12   Highest education level: High school graduate  Occupational History  Not on file  Tobacco Use   Smoking status: Never   Smokeless tobacco: Never  Vaping Use   Vaping Use: Never used  Substance and Sexual Activity   Alcohol use: No    Alcohol/week: 0.0 standard drinks   Drug use: No   Sexual activity: Not Currently  Other Topics Concern   Not on file  Social History Narrative   Pt lives alone in her 1 story home- she has 3 sons, and a high school  graduate. She is right handed, she drinks coffee, and one soda a week mostly water   Social Determinants of Health   Financial Resource Strain: Not on file  Food Insecurity: Not on file  Transportation Needs: Not on file  Physical Activity: Not on file  Stress: Not on file  Social Connections: Not on file     FAMILY HISTORY:  We obtained a detailed, 4-generation family history.  Significant diagnoses are listed below: Family History  Problem Relation Age of Onset   Breast cancer Mother    Melanoma Father    Lung cancer Father    Healthy Son     Jamie [name]'s mother was diagnosed with breast cancer at age 67, she died at age 70. Her father was diagnosed with both melanoma and lung cancer in his 75s, he died at age 82. Jamie [name] is unaware of previous family history of genetic testing for hereditary cancer risks. There is no reported Ashkenazi Jewish ancestry.  GENETIC COUNSELING ASSESSMENT: Jamie [name] is a 87 y.o. female with a personal and family history of cancer which is somewhat suggestive of a hereditary predisposition to cancer. We, therefore, discussed and recommended the following at today's visit.   DISCUSSION: We discussed that 5 - 10% of cancer is hereditary, with most cases of breast cancer associated with BRCA1/2.  There are other genes that can be associated with hereditary breast cancer syndromes.  We discussed that testing is beneficial for several reasons including knowing how to follow individuals after completing their treatment, identifying whether potential treatment options would be beneficial, and understanding if other family members could be at risk for cancer and allowing them to undergo genetic testing.   We reviewed the characteristics, features and inheritance patterns of hereditary cancer syndromes. We also discussed genetic testing, including the appropriate family members to test, the process of testing, insurance coverage and turn-around-time for results. We  discussed the implications of a negative, positive, carrier and/or variant of uncertain significant result. We recommended Jamie [name] pursue genetic testing for a panel that includes genes associated with breast cancer, melanoma, and lung cancer.   Ms. Zuch elected to have [facility]. The CustomNext panel offered by [employer] includes sequencing, rearrangement analysis, and RNA analysis for the following 41 genes:  APC, ATM, AXIN2, BAP1, BARD1, BMPR1A, BRCA1, BRCA2, BRIP1, CDH1, CDK4, CDKN2A, CHEK2, DICER1, EGFR, EPCAM, GREM1, HOXB13, MITF, MLH1, MSH2, MSH3, MSH6, MUTYH, NBN, NF1, NTHL1, PALB2, PMS2, POLD1, POLE, POT1, PTEN, RAD51C, RAD51D, RB1, RECQL, SMAD4, SMARCA4, STK11, and TP53.   Based on Ms. Rauda's personal and family history of cancer, she meets medical criteria for genetic testing. Despite that she meets criteria, she may still have an out of pocket cost. We discussed that if her out of pocket cost for testing is over $100, the laboratory will call and confirm whether she wants to proceed with testing.  If the out of pocket cost of testing is less than $100 she will be billed by the genetic testing  laboratory.   PLAN: After considering the risks, benefits, and limitations, Ms. Chopra provided informed consent to pursue genetic testing and the blood sample was sent to [facility] for analysis of the CustomNext Panel. Results should be available within approximately 2-3 weeks' time, at which point they will be disclosed by telephone to Ms. Vanpatten, as will any additional recommendations warranted by these results. Ms. Kemnitz will receive a summary of her genetic counseling visit and a copy of her results once available. This information will also be available in Epic.   Ms. Kontos's questions were answered to her satisfaction today. Our contact information was provided should additional questions or concerns arise. Thank you for the referral and allowing [place] to share in the care of  your patient.   Lucille Passy, MS, [facility] Genetic Counselor [email or web address] (P) [phone]  The patient was seen for a total of 20 minutes in face-to-face genetic counseling. The patient brought her son and daughter-in-law. Drs. Lindi Adie and/or Burr Medico were available to discuss this case as needed.   _______________________________________________________________________ For Office Staff:  Number of people involved in session: 3 Was an Intern/ student involved with case: no

## 2021-07-13 NOTE — Therapy (Signed)
OUTPATIENT PHYSICAL THERAPY BREAST CANCER BASELINE EVALUATION   Patient Name: Jamie [name] MRN: [id number] DOB:11-22-1934, 87 y.o., female Today's Date: 07/13/2021   PT End of Session - 07/13/21 1200     Visit Number 1    Number of Visits 2    Date for PT Re-Evaluation 09/07/21    PT Start Time 1009    PT Stop Time 1041    PT Time Calculation (min) 32 min    Activity Tolerance Patient tolerated treatment well    Behavior During Therapy [facility] for tasks assessed/performed             Past Medical History:  Diagnosis Date   Abnormality of gait 04/18/2017   Degenerative joint disease of knee, right 03/19/2018   Injected April 18, 2018 Approved for Durolane R knee 3.4.20. Patient given another steroid injection Jun 24, 2018.  Repeat injection September 09, 2018 repeat injection February 27, 2019 repeat 6/110/20221 bilateral injections given December 01, 2019 Right knee injected April 20, 2020 monovisc May 19, 2018 Repeat steroid injection given June 16, 2020 repeat injection August 24, 2020   Essential hypertension 08/14/2016   Generalized anxiety disorder 06/27/2017   History of dizziness 06/27/2017   History of hysterectomy for benign disease 06/27/2017   History of vitamin D deficiency 08/22/2017   Leukocytes in urine 08/14/2016   Lumbar facet arthropathy 06/27/2017   Major neurocognitive disorder due to multiple etiologies 09/29/2020   Mixed hyperlipidemia 08/22/2017   Osteoarthritis of knee 09/18/2017   Pancolitis 08/14/2016   Parkinson's disease 03/13/2017   Prediabetes 08/22/2017   Right knee pain    Spinal stenosis of lumbar region    Weakness of both hips 04/18/2017   White coat syndrome without diagnosis of hypertension 08/22/2017   Past Surgical History:  Procedure Laterality Date   HYSTERECTOMY ABDOMINAL WITH SALPINGECTOMY     TONSILLECTOMY     Patient Active Problem List   Diagnosis Date Noted   Malignant neoplasm of lower-inner quadrant of left breast in  female, estrogen receptor positive ([place]) 07/11/2021   Mild episode of recurrent major depressive disorder ([place]) 04/06/2021   Age-related osteoporosis without current pathological fracture 04/06/2021   Greater trochanteric bursitis of right hip 01/21/2021   Major neurocognitive disorder due to multiple etiologies 09/29/2020   Right knee pain 05/07/2018   Degenerative joint disease of knee, right 03/19/2018   Osteoarthritis of knee 09/18/2017   Mixed hyperlipidemia 08/22/2017   Prediabetes 08/22/2017   White coat syndrome without diagnosis of hypertension 08/22/2017   History of vitamin D deficiency 08/22/2017   Lumbar facet arthropathy 06/27/2017   History of hysterectomy for benign disease 06/27/2017   Generalized anxiety disorder 06/27/2017   History of dizziness 06/27/2017   Other fatigue 06/27/2017   Overweight (BMI 25.0-29.9) 06/27/2017   Weakness of both hips 04/18/2017   Abnormality of gait 04/18/2017   Spinal stenosis of lumbar region 04/18/2017   Parkinson's disease 03/13/2017   Pancolitis 08/14/2016   Essential hypertension 08/14/2016   Leukocytes in urine 08/14/2016    PCP: Lorrene Reid, PA  REFERRING PROVIDER: Dr. Coralie Keens  REFERRING DIAG: Left breast cancer  THERAPY DIAG:  Malignant neoplasm of lower-inner quadrant of left breast in female, estrogen receptor positive ([place])  Abnormal posture  Rationale for Evaluation and Treatment Rehabilitation  ONSET DATE: 06/27/2021  SUBJECTIVE  SUBJECTIVE STATEMENT: Patient reports she is here today to be seen by her medical team for her newly diagnosed left breast cancer.   PERTINENT HISTORY:  Patient was diagnosed on 06/27/2021 with left grade 2 invasive ductal carcinoma breast cancer. It measures 3.9 cm and is located in the lower  inner quadrant. It is triple positive with a Ki67 of 40%. Patient has cognitive deficits effecting memory and was diagnosed with Parkinson's in 2020.  PATIENT GOALS   reduce lymphedema risk and learn post op HEP.   PAIN:  Are you having pain? No   PRECAUTIONS: Active CA Other: Memory deficits, hard of hearing  HAND DOMINANCE: right  WEIGHT BEARING RESTRICTIONS No  FALLS:  Has patient fallen in last 6 months? 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    POSTURE:  Forward head and rounded shoulders posture; increased thoracic kyphosis  UPPER EXTREMITY AROM/PROM:  A/PROM RIGHT   eval   Shoulder extension 43  Shoulder flexion 131  Shoulder abduction 134  Shoulder internal rotation 66  Shoulder external rotation 62    (Blank rows = not tested)  A/PROM LEFT   eval  Shoulder extension 60  Shoulder flexion 122  Shoulder abduction 137  Shoulder internal rotation 72  Shoulder external rotation 54    (Blank rows = not tested)   CERVICAL AROM: All within normal limits:    Percent limited  Flexion WNL  Extension 25% limited  Right lateral flexion 50% limited  Left lateral flexion 50% limited  Right rotation 50% limited  Left rotation 50% limited     UPPER EXTREMITY STRENGTH: WFL   LYMPHEDEMA ASSESSMENTS:   LANDMARK RIGHT   eval  10 cm proximal to olecranon process 31  Olecranon process 25.4  10 cm proximal to ulnar styloid process 23.1  Just proximal to ulnar styloid process 15.9  Across hand at thumb web space 17.3  At base of 2nd digit 6  (Blank rows = not tested)  LANDMARK LEFT   eval  10 cm proximal to  olecranon process 30.2  Olecranon process 24.3  10 cm proximal to ulnar styloid process 22  Just proximal to ulnar styloid process 16.1  Across hand at thumb web space 17  At base of 2nd digit 5.8  (Blank rows = not tested)   L-DEX LYMPHEDEMA SCREENING:  The patient was assessed using the L-Dex machine today to produce a lymphedema index baseline score. The patient will be reassessed on a regular basis (typically every 3 months) to obtain new L-Dex scores. If the score is > 6.5 points away from his/her baseline score indicating onset of subclinical lymphedema, it will be recommended to wear a compression garment for 4 weeks, 12 hours per day and then be reassessed. If the score continues to be > 6.5 points from baseline at reassessment, we will initiate lymphedema treatment. Assessing in this manner has a 95% rate of preventing clinically significant lymphedema.   L-DEX FLOWSHEETS - 07/13/21 1200       L-DEX LYMPHEDEMA SCREENING   Measurement Type Unilateral    L-DEX MEASUREMENT EXTREMITY Upper Extremity    POSITION  Standing    DOMINANT SIDE Right  At Risk Side Left    BASELINE SCORE (UNILATERAL) 0.1              QUICK DASH SURVEY:  Katina Dung - 07/13/21 0001     Open a tight or new jar Mild difficulty    Do heavy household chores (wash walls, wash floors) No difficulty    Carry a shopping bag or briefcase No difficulty    Wash your back Severe difficulty    Use a knife to cut food No difficulty    Recreational activities in which you take some force or impact through your arm, shoulder, or hand (golf, hammering, tennis) Mild difficulty    During the past week, to what extent has your arm, shoulder or hand problem interfered with your normal social activities with family, friends, neighbors, or groups? Not at all    During the past week, to what extent has your arm, shoulder or hand problem limited your work or other regular daily activities Not at all    Arm, shoulder,  or hand pain. None    Tingling (pins and needles) in your arm, shoulder, or hand None    Difficulty Sleeping No difficulty    DASH Score 11.36 %              PATIENT EDUCATION:  Education details: Lymphedema risk reduction and post op shoulder/posture HEP Person educated: Patient Education method: Explanation, Demonstration, Handout Education comprehension: Patient verbalized understanding and returned demonstration  HOME EXERCISE PROGRAM: Patient was instructed today in a home exercise program today for post op shoulder range of motion. These included active assist shoulder flexion in sitting, scapular retraction, wall walking with shoulder abduction, and hands behind head external rotation.  She was encouraged to do these twice a day, holding 3 seconds and repeating 5 times when permitted by her physician.   ASSESSMENT:  CLINICAL IMPRESSION: Patient was diagnosed on 06/27/2021 with left grade 2 invasive ductal carcinoma breast cancer. It measures 3.9 cm and is located in the lower inner quadrant. It is triple positive with a Ki67 of 40%. Patient has cognitive deficits effecting memory and was diagnosed with Parkinson's in 2020. Her multidisciplinary medical team met prior to her assessments to determine a recommended treatment plan. She is planning to have a left mastectomy and targeted axillary node dissection followed by radiation and Herceptin. She will benefit from a post op PT reassessment to determine needs and from L-Dex screens every 3 months for 2 years to detect subclinical lymphedema.  Pt will benefit from skilled therapeutic intervention to improve on the following deficits: Decreased knowledge of precautions, impaired UE functional use, pain, decreased ROM, postural dysfunction.   PT treatment/interventions: ADL/self-care home management, pt/family education, therapeutic exercise  REHAB POTENTIAL: Good  CLINICAL DECISION MAKING: Stable/uncomplicated  EVALUATION  COMPLEXITY: Low   GOALS: Goals reviewed with patient? YES  LONG TERM GOALS: (STG=LTG)    Name Target Date Goal status  1 Pt will be able to verbalize understanding of pertinent lymphedema risk reduction practices relevant to her dx specifically related to skin care.  Baseline:  No knowledge 07/13/2021 Achieved at eval  2 Pt will be able to return demo and/or verbalize understanding of the post op HEP related to regaining shoulder ROM. Baseline:  No knowledge 07/13/2021 Achieved at eval  3 Pt will be able to verbalize understanding of the importance of attending the post op After Breast CA Class for further lymphedema risk reduction education and therapeutic exercise.  Baseline:  No knowledge  07/13/2021 Achieved at eval  4 Pt will demo she has regained full shoulder ROM and function post operatively compared to baselines.  Baseline: See objective measurements taken today. 09/07/2021      PLAN: PT FREQUENCY/DURATION: EVAL and 1 follow up appointment.   PLAN FOR NEXT SESSION: will reassess 3-4 weeks post op to determine needs.   Patient will follow up at outpatient cancer rehab 3-4 weeks following surgery.  If the patient requires physical therapy at that time, a specific plan will be dictated and sent to the referring physician for approval. The patient was educated today on appropriate basic range of motion exercises to begin post operatively and the importance of attending the After Breast Cancer class following surgery.  Patient was educated today on lymphedema risk reduction practices as it pertains to recommendations that will benefit the patient immediately following surgery.  She verbalized good understanding.    Physical Therapy Information for After Breast Cancer Surgery/Treatment:  Lymphedema is a swelling condition that you may be at risk for in your arm if you have lymph nodes removed from the armpit area.  After a sentinel node biopsy, the risk is approximately 5-9% and is higher  after an axillary node dissection.  There is treatment available for this condition and it is not life-threatening.  Contact your physician or physical therapist with concerns. You may begin the 4 shoulder/posture exercises (see additional sheet) when permitted by your physician (typically a week after surgery).  If you have drains, you may need to wait until those are removed before beginning range of motion exercises.  A general recommendation is to not lift your arms above shoulder height until drains are removed.  These exercises should be done to your tolerance and gently.  This is not a "no pain/no gain" type of recovery so listen to your body and stretch into the range of motion that you can tolerate, stopping if you have pain.  If you are having immediate reconstruction, ask your plastic surgeon about doing exercises as he or she may want you to wait. We encourage you to attend the free one time ABC (After Breast Cancer) class offered by [facility].  You will learn information related to lymphedema risk, prevention and treatment and additional exercises to regain mobility following surgery.  You can call [phone] for more information.  This is offered the 1st and 3rd Monday of each month.  You only attend the class one time. While undergoing any medical procedure or treatment, try to avoid blood pressure being taken or needle sticks from occurring on the arm on the side of cancer.   This recommendation begins after surgery and continues for the rest of your life.  This may help reduce your risk of getting lymphedema (swelling in your arm). An excellent resource for those seeking information on lymphedema is the National Lymphedema Network's web site. It can be accessed at [place].org If you notice swelling in your hand, arm or breast at any time following surgery (even if it is many years from now), please contact your doctor or physical therapist to discuss this.   Lymphedema can be treated at any time but it is easier for you if it is treated early on.  If you feel like your shoulder motion is not returning to normal in a reasonable amount of time, please contact your surgeon or physical therapist.  [facility] [phone]. [address], [place] [address]  ABC  CLASS After Breast Cancer Class  After Breast Cancer Class is a specially designed exercise class to assist you in a safe recover after having breast cancer surgery.  In this class you will learn how to get back to full function whether your drains were just removed or if you had surgery a month ago.  This one-time class is held the 1st and 3rd Monday of every month from 11:00 a.m. until 12:00 noon virtually.  This class is FREE and space is limited. For more information or to register for the next available class, call [phone].  Class Goals  Understand specific stretches to improve the flexibility of you chest and shoulder. Learn ways to safely strengthen your upper body and improve your posture. Understand the warning signs of infection and why you may be at risk for an arm infection. Learn about Lymphedema and prevention.  ** You do not attend this class until after surgery.  Drains must be removed to participate  Patient was instructed today in a home exercise program today for post op shoulder range of motion. These included active assist shoulder flexion in sitting, scapular retraction, wall walking with shoulder abduction, and hands behind head external rotation.  She was encouraged to do these twice a day, holding 3 seconds and repeating 5 times when permitted by her physician.   Annia Friendly, [place] 07/13/21 12:12 PM

## 2021-07-13 NOTE — Assessment & Plan Note (Signed)
07/09/2021:Skin changes in the left breast, screening mammogram revealed thickening of the lower medial quadrant along with enlarged lymph node in the axilla (2 additional lymph nodes) by ultrasound breast tumor measured 3.9 cm, biopsy: Grade 2 IDC with lymphovascular invasion ER 100%, PR 100%, HER2 positive Ki-67 40%, lymph node biopsy positive  Pathology and radiology counseling: Discussed with the patient, the details of pathology including the type of breast cancer,the clinical staging, the significance of ER, PR and HER-2/neu receptors and the implications for treatment. After reviewing the pathology in detail, we proceeded to discuss the different treatment options between surgery, radiation, chemotherapy, antiestrogen therapies.  Treatment plan: 1.  Mastectomy with targeted node dissection 2. adjuvant Herceptin with aromatase inhibitors 3.  Adjuvant radiation Genetics consultation (mother had breast cancer age 55)  Herceptin counseling: Discussed risk and benefits of Herceptin also discussed risks and benefits of letrozole Return to clinic after surgery to start adjuvant therapy

## 2021-07-13 NOTE — Progress Notes (Signed)
Jamie [name]  Initial Assessment   Jamie [name] is a 87 y.o. year old female accompanied by son Jamie [name]), [name]). Clinical Social [name] was referred by  [facility]  for assessment of psychosocial needs.   SDOH (Social Determinants of Health) assessments performed: Yes SDOH Interventions    Flowsheet Row Most Recent Value  SDOH Interventions   Food Insecurity Interventions Intervention Not Indicated  Financial Strain Interventions Intervention Not Indicated  Housing Interventions Intervention Not Indicated  Transportation Interventions Intervention Not Indicated, Patient Resources (Friends/Family)       SDOH Screenings   Alcohol Screen: Not on file  Depression (PHQ2-9): Low Risk    PHQ-2 Score: 0  Financial Resource Strain: Low Risk    Difficulty of Paying Living Expenses: Not hard at all  Food Insecurity: No Food Insecurity   Worried About Charity fundraiser in the Last Year: Never true   Arboriculturist in the Last Year: Never true  Housing: Low Risk    Last Housing Risk Score: 0  Physical Activity: Not on file  Social Connections: Not on file  Stress: Not on file  Tobacco Use: Low Risk    Smoking Tobacco Use: Never   Smokeless Tobacco Use: Never   Passive Exposure: Not on file  Transportation Needs: No Transportation Needs   Lack of Transportation (Medical): No   Lack of Transportation (Non-Medical): No     Distress Screen completed: Yes    07/13/2021   11:20 AM  ONCBCN DISTRESS SCREENING  Screening Type Initial Screening  Distress experienced in past week (1-10) 0      Family/Social Information:  Housing Arrangement: patient lives alone. 3 sons & their families nearby Family members/support persons in your life? Family (sons- Jamie [name], Jamie [name], [name]) Transportation concerns: no  Employment: Retired .  Income source: Paediatric nurse concerns: No Type of concern: None Food access  concerns: no Religious or spiritual practice: Jamie [name] is helping her with this diagnosis Services Currently in place:  insurance- [facility], advance directives in place  Coping/ Adjustment to diagnosis: Patient understands treatment plan and what happens next? yes Concerns about diagnosis and/or treatment: I'm not especially worried about anything Patient reported stressors:  Not feeling stressed Current coping skills/ strengths: Capable of independent living , Religious Affiliation , and Supportive family/friends     SUMMARY: Current SDOH Barriers:  No significant SDOH concerns at this time  Clinical Social [name] Clinical Goal(s):  No clinical social [name] goals at this time  Interventions: Discussed common feeling and emotions when being diagnosed with cancer, and the importance of support during treatment Informed patient of the support team roles and support services at [facility] Provided [facility] contact information and encouraged patient to call with any questions or concerns   Follow Up Plan: Patient will contact CSW with any support or resource needs Patient verbalizes understanding of plan: Yes    Josephene Marrone E Bain Whichard, LCSW

## 2021-07-14 ENCOUNTER — Encounter: Payer: Self-pay | Admitting: Genetic Counselor

## 2021-07-14 DIAGNOSIS — Z803 Family history of malignant neoplasm of breast: Secondary | ICD-10-CM

## 2021-07-14 HISTORY — DX: Family history of malignant neoplasm of breast: Z80.3

## 2021-07-20 DIAGNOSIS — B353 Tinea pedis: Secondary | ICD-10-CM | POA: Diagnosis not present

## 2021-07-20 DIAGNOSIS — B351 Tinea unguium: Secondary | ICD-10-CM | POA: Diagnosis not present

## 2021-07-20 DIAGNOSIS — L600 Ingrowing nail: Secondary | ICD-10-CM | POA: Diagnosis not present

## 2021-07-20 DIAGNOSIS — I70203 Unspecified atherosclerosis of native arteries of extremities, bilateral legs: Secondary | ICD-10-CM | POA: Diagnosis not present

## 2021-07-21 ENCOUNTER — Encounter: Payer: Self-pay | Admitting: *Deleted

## 2021-07-21 ENCOUNTER — Telehealth: Payer: Self-pay | Admitting: *Deleted

## 2021-07-21 NOTE — Telephone Encounter (Signed)
Spoke with [name] (daughter in law) to follow up from [facility] [place] [facility] and assess navigation needs. Answered some questions she had regarding surgery and encouraged her to call should any other questions or concerns arise. [name] verbalized understanding.

## 2021-07-25 ENCOUNTER — Telehealth: Payer: Self-pay | Admitting: Genetic Counselor

## 2021-07-25 ENCOUNTER — Encounter: Payer: Self-pay | Admitting: Genetic Counselor

## 2021-07-25 DIAGNOSIS — Z1379 Encounter for other screening for genetic and chromosomal anomalies: Secondary | ICD-10-CM | POA: Insufficient documentation

## 2021-07-25 NOTE — Telephone Encounter (Addendum)
Per Ms. Blakesley's request, I contacted her daughter-in-law, Almyra Free, to discuss her genetic testing results. No pathogenic variants were identified in the 41 genes analyzed. Detailed clinic note to follow.  The test report has been scanned into EPIC and is located under the Molecular Pathology section of the Results Review tab.  A portion of the result report is included below for reference.   Lucille Passy, MS, [facility] Genetic Counselor [email or web address] (P) [phone]

## 2021-07-28 ENCOUNTER — Encounter: Payer: Self-pay | Admitting: Genetic Counselor

## 2021-07-28 DIAGNOSIS — D1801 Hemangioma of skin and subcutaneous tissue: Secondary | ICD-10-CM | POA: Diagnosis not present

## 2021-07-28 DIAGNOSIS — L218 Other seborrheic dermatitis: Secondary | ICD-10-CM | POA: Diagnosis not present

## 2021-07-28 DIAGNOSIS — D224 Melanocytic nevi of scalp and neck: Secondary | ICD-10-CM | POA: Diagnosis not present

## 2021-07-28 DIAGNOSIS — B351 Tinea unguium: Secondary | ICD-10-CM | POA: Diagnosis not present

## 2021-07-28 DIAGNOSIS — D225 Melanocytic nevi of trunk: Secondary | ICD-10-CM | POA: Diagnosis not present

## 2021-07-28 DIAGNOSIS — Z85828 Personal history of other malignant neoplasm of skin: Secondary | ICD-10-CM | POA: Diagnosis not present

## 2021-07-28 DIAGNOSIS — B353 Tinea pedis: Secondary | ICD-10-CM | POA: Diagnosis not present

## 2021-07-28 DIAGNOSIS — L821 Other seborrheic keratosis: Secondary | ICD-10-CM | POA: Diagnosis not present

## 2021-07-28 NOTE — Progress Notes (Unsigned)
[place] Phone: [phone] Subjective:   Jamie [name], am serving as a scribe for Dr. Hulan [name].   I'm seeing this patient by the request  of:  Jamie Reid, PA-C  CC: right knee and hip pain follow up   EXB:[id number]  05/03/2021 Known arthritic changes but patient wants to hold on any other injection.  Doing relatively well.  [name] other change in management.  Injection given today.  Tolerated the procedure very well.  Discussed icing regimen and home exercise.  Increase activity slowly.  Follow-up again in 6 to 8 weeks.  Differential also includes multiple lumbar radiculopathy.  Updated 08/02/2021 Jamie [name] is a 87 y.o. female coming in with complaint of right knee and hip. Patient states that her back pain is worse in lumbar spine. Pain is not constant.   R knee pain remains intermittent.        Past Medical History:  Diagnosis Date   Abnormality of gait 04/18/2017   Degenerative joint disease of knee, right 03/19/2018   Injected April 18, 2018 Approved for Durolane R knee 3.4.20. Patient given another steroid injection Jun 24, 2018.  Repeat injection September 09, 2018 repeat injection February 27, 2019 repeat 6/110/20221 bilateral injections given December 01, 2019 Right knee injected April 20, 2020 monovisc May 19, 2018 Repeat steroid injection given June 16, 2020 repeat injection August 24, 2020   Essential hypertension 08/14/2016   Generalized anxiety disorder 06/27/2017   History of dizziness 06/27/2017   History of hysterectomy for benign disease 06/27/2017   History of vitamin D deficiency 08/22/2017   Leukocytes in urine 08/14/2016   Lumbar facet arthropathy 06/27/2017   Major neurocognitive disorder due to multiple etiologies 09/29/2020   Mixed hyperlipidemia 08/22/2017   Osteoarthritis of knee 09/18/2017   Pancolitis 08/14/2016   Parkinson's disease 03/13/2017   Prediabetes  08/22/2017   Right knee pain    Spinal stenosis of lumbar region    Weakness of both hips 04/18/2017   White coat syndrome without diagnosis of hypertension 08/22/2017   Past Surgical History:  Procedure Laterality Date   HYSTERECTOMY ABDOMINAL WITH SALPINGECTOMY     TONSILLECTOMY     Social History   Socioeconomic History   Marital status: Widowed    Spouse name: Not on file   Number of children: 3   Years of education: 12   Highest education level: High school graduate  Occupational History   Not on file  Tobacco Use   Smoking status: Never   Smokeless tobacco: Never  Vaping Use   Vaping Use: Never used  Substance and Sexual Activity   Alcohol use: [name]    Alcohol/week: 0.0 standard drinks of alcohol   Drug use: [name]   Sexual activity: Not Currently  Other Topics Concern   Not on file  Social History Narrative   Pt lives alone in her 1 story home- she has 3 sons, and a high school graduate. She is right handed, she drinks coffee, and one soda a week mostly water   Social Determinants of Health   Financial Resource Strain: Low Risk  (07/13/2021)   Overall Financial Resource Strain (CARDIA)    Difficulty of Paying Living Expenses: Not hard at all  Food Insecurity: [name] Food Insecurity (07/13/2021)   Hunger Vital Sign    Worried About Running Out of Food in the Last Year: Never true    Ran Out of Food in the Last  Year: Never true  Transportation Needs: [name] Transportation Needs (07/13/2021)   PRAPARE - Hydrologist (Medical): [name]    Lack of Transportation (Non-Medical): [name]  Physical Activity: Not on file  Stress: Not on file  Social Connections: Not on file   Allergies  Allergen Reactions   Flagyl [Metronidazole] Rash    Possible cause of rash    Family History  Problem Relation Age of Onset   Breast cancer Mother 6   Melanoma Father        dx. 47s   Lung cancer Father        dx. 67s   Healthy Son      Current Outpatient Medications  (Cardiovascular):    rosuvastatin (CRESTOR) 5 MG tablet, TAKE 1 TABLET BY MOUTH AT BEDTIME     Current Outpatient Medications (Other):    carbidopa-levodopa (SINEMET IR) 25-100 MG tablet, Take 2 tablets by mouth 3 (three) times daily.   Coenzyme Q10 (COQ-10) 100 MG CAPS, Take 1 tablet by mouth daily.   mirtazapine (REMERON) 7.5 MG tablet, Take 1 tablet (7.5 mg total) by mouth at bedtime.   Multiple Vitamin (MULTIVITAMIN WITH MINERALS) TABS tablet, Take 1 tablet by mouth daily.   Multiple Vitamins-Minerals (PRESERVISION AREDS 2 PO), Take 1 tablet by mouth in the morning and at bedtime.    NON FORMULARY, Herblax as needed   NON FORMULARY, Cholesterol Reduction   Vitamin E 100 units TABS, Take 250 mg by mouth. Once a day   Reviewed prior external information including notes and imaging from  primary care provider As well as notes that were available from care everywhere and other healthcare systems.  Past medical history, social, surgical and family history all reviewed in electronic medical record.  [name] pertanent information unless stated regarding to the chief complaint.   Review of Systems:  [name] headache, visual changes, nausea, vomiting, diarrhea, constipation, dizziness, abdominal pain, skin rash, fevers, chills, night sweats, weight loss, swollen lymph nodes, \ joint swelling, chest pain, shortness of breath, mood changes. POSITIVE muscle aches, body aches  Objective  Blood pressure 124/76, pulse 80, height '5\' 8"'$  (1.727 m), weight 173 lb (78.5 kg), SpO2 97 %.   General: [name] apparent distress. Mild masked facies HEENT: Pupils equal, extraocular movements intact  Respiratory: Patient's speak in full sentences and does not appear short of breath  Cardiovascular: [name] lower extremity edema, non tender, [name] erythema  Patient does have mild shuffling gait noted.  Mild cogwheeling noted.  Does have some loss of lordosis of the lumbar spine.  Patient does have trace effusion noted of the knee  with instability noted    Impression and Recommendations:    The above documentation has been reviewed and is accurate and complete Jamie Pulley, DO

## 2021-08-01 ENCOUNTER — Ambulatory Visit: Payer: Self-pay | Admitting: Genetic Counselor

## 2021-08-01 DIAGNOSIS — Z1379 Encounter for other screening for genetic and chromosomal anomalies: Secondary | ICD-10-CM

## 2021-08-01 NOTE — Progress Notes (Signed)
HPI:   Ms. Mille was previously seen in the [facility] clinic due to a personal and family history of cancer and concerns regarding a hereditary predisposition to cancer. Please refer to our prior cancer genetics clinic note for more information regarding our discussion, assessment and recommendations, at the time. Ms. Gutridge recent genetic test results were disclosed to her, as were recommendations warranted by these results. These results and recommendations are discussed in more detail below.  CANCER HISTORY:  Oncology History  Malignant neoplasm of lower-inner quadrant of left breast in female, estrogen receptor positive ([facility])  07/11/2021 Initial Diagnosis   Skin changes in the left breast, screening mammogram revealed thickening of the lower medial quadrant along with enlarged lymph node in the axilla (2 additional lymph nodes) by ultrasound breast tumor measured 3.9 cm, biopsy: Grade 2 IDC with lymphovascular invasion ER 100%, PR 100%, HER2 positive Ki-67 40%, lymph node biopsy positive   07/13/2021 Cancer Staging   Staging form: Breast, AJCC 8th Edition - Clinical: Stage IB (cT2, cN1, cM0, G2, ER+, PR+, HER2+) - Signed by Nicholas Lose, MD on 07/13/2021 Stage prefix: Initial diagnosis Histologic grading system: 3 grade system    Genetic Testing   [facility] CustomNext Panel was Negative. Report date is 07/24/2021.  The CustomNext panel offered by [employer] includes sequencing, rearrangement, and RNA analysis for the following 41 genes: APC, ATM, BAP1, BARD1, BMPR1A, BRCA1, BRCA2, BRIP1, CDH1, CDK4, CDKN2A, CHEK2, DICER1, MLH1, MSH2, MSH6, MUTYH, NBN, NF1, NTHL1, PALB2, PMS2, POT1, PTEN, RAD51C, RAD51D, RB1, RECQL, SMAD4, SMARCA4, STK11 and TP53 (sequencing and deletion/duplication); AXIN2, EGFR, HOXB13, MITF, MSH3, POLD1 and POLE (sequencing only); EPCAM and GREM1 (deletion/duplication only).      FAMILY HISTORY:  We obtained a detailed, 4-generation family history.   Significant diagnoses are listed below:      Family History  Problem Relation Age of Onset   Breast cancer Mother     Melanoma Father     Lung cancer Father     Healthy Son       Ms. Meggison's mother was diagnosed with breast cancer at age 89, she died at age 103. Her father was diagnosed with both melanoma and lung cancer in his 52s, he died at age 23. Ms. Geyer is unaware of previous family history of genetic testing for hereditary cancer risks. There is no reported Ashkenazi Jewish ancestry.    GENETIC TEST RESULTS:  The [employer] CustomNext Panel found no pathogenic mutations.   The CustomNext panel offered by [employer] includes sequencing, rearrangement analysis, and RNA analysis for the following 41 genes:  APC, ATM, AXIN2, BAP1, BARD1, BMPR1A, BRCA1, BRCA2, BRIP1, CDH1, CDK4, CDKN2A, CHEK2, DICER1, EGFR, EPCAM, GREM1, HOXB13, MITF, MLH1, MSH2, MSH3, MSH6, MUTYH, NBN, NF1, NTHL1, PALB2, PMS2, POLD1, POLE, POT1, PTEN, RAD51C, RAD51D, RB1, RECQL, SMAD4, SMARCA4, STK11, and TP53.   The test report has been scanned into EPIC and is located under the Molecular Pathology section of the Results Review tab.  A portion of the result report is included below for reference. Genetic testing reported out on 07/24/2021.        Even though a pathogenic variant was not identified, possible explanations for her personal history of cancer may include: There may be no hereditary risk for cancer in the family. The cancers in Ms. Marney and/or her family may be due to other genetic or environmental factors. There may be a gene mutation in one of these genes that current testing methods cannot detect, but  that chance is small. There could be another gene that has not yet been discovered, or that we have not yet tested, that is responsible for the cancer diagnoses in the family.   Therefore, it is important to remain in touch with cancer genetics in the future so that we can continue to offer Ms. Apo the  most up to date genetic testing.   ADDITIONAL GENETIC TESTING:  We discussed with Ms. Kane that her genetic testing was fairly extensive.  If there are genes identified to increase cancer risk that can be analyzed in the future, we would be happy to discuss and coordinate this testing at that time.    CANCER SCREENING RECOMMENDATIONS:  Ms. Blust's test result is considered negative (normal).  This means that we have not identified a hereditary cause for her personal and family history of cancer at this time. Most cancers happen by chance and this negative test suggests that her cancer may fall into this category.    An individual's cancer risk and medical management are not determined by genetic test results alone. Overall cancer risk assessment incorporates additional factors, including personal medical history, family history, and any available genetic information that may result in a personalized plan for cancer prevention and surveillance. Therefore, it is recommended she continue to follow the cancer management and screening guidelines provided by her oncology and primary healthcare provider.  RECOMMENDATIONS FOR FAMILY MEMBERS:   Since she did not inherit a mutation in a cancer predisposition gene included on this panel, her children could not have inherited a mutation from her in one of these genes. Individuals in this family might be at some increased risk of developing cancer, over the general population risk, due to the family history of cancer. We recommend women in this family have a yearly mammogram beginning at age 20, or 9 years younger than the earliest onset of cancer, an annual clinical breast exam, and perform monthly breast self-exams.  FOLLOW-UP:  Cancer genetics is a rapidly advancing field and it is possible that new genetic tests will be appropriate for her and/or her family members in the future. We encouraged her to remain in contact with cancer genetics on an annual basis  so we can update her personal and family histories and let her know of advances in cancer genetics that may benefit this family.   Our contact number was provided. Ms. Dority's questions were answered to her satisfaction, and she knows she is welcome to call us at anytime with additional questions or concerns.   Lucille Passy, MS, [facility] Genetic Counselor [email or web address] (P) [phone]

## 2021-08-02 ENCOUNTER — Encounter: Payer: Self-pay | Admitting: Family Medicine

## 2021-08-02 ENCOUNTER — Ambulatory Visit (INDEPENDENT_AMBULATORY_CARE_PROVIDER_SITE_OTHER): Payer: Medicare PPO

## 2021-08-02 ENCOUNTER — Ambulatory Visit: Payer: Medicare PPO | Admitting: Family Medicine

## 2021-08-02 VITALS — BP 124/76 | HR 80 | Ht 68.0 in | Wt 173.0 lb

## 2021-08-02 DIAGNOSIS — M546 Pain in thoracic spine: Secondary | ICD-10-CM

## 2021-08-02 DIAGNOSIS — M545 Low back pain, unspecified: Secondary | ICD-10-CM

## 2021-08-02 DIAGNOSIS — Z170 Estrogen receptor positive status [ER+]: Secondary | ICD-10-CM | POA: Diagnosis not present

## 2021-08-02 DIAGNOSIS — C50312 Malignant neoplasm of lower-inner quadrant of left female breast: Secondary | ICD-10-CM

## 2021-08-02 DIAGNOSIS — M1711 Unilateral primary osteoarthritis, right knee: Secondary | ICD-10-CM

## 2021-08-02 NOTE — Assessment & Plan Note (Signed)
Would consider an injection again but at the moment we will hold with patient having a surgical procedure for mastectomy in 2 weeks time.  Do not want anything that would delay surgery at the moment.  We discussed that we would consider the repeat injections 4 weeks afterward.

## 2021-08-02 NOTE — Patient Instructions (Signed)
Thoracic and lumbar xray today Good luck with surgery See me again in 4 weeks after surgery

## 2021-08-02 NOTE — Assessment & Plan Note (Signed)
We will get x-rays as well of the thoracic and lumbar spine to make sure there is no bony abnormality that could be a aidingin staging.

## 2021-08-03 ENCOUNTER — Other Ambulatory Visit: Payer: Medicare PPO

## 2021-08-03 DIAGNOSIS — Z0000 Encounter for general adult medical examination without abnormal findings: Secondary | ICD-10-CM

## 2021-08-03 DIAGNOSIS — I10 Essential (primary) hypertension: Secondary | ICD-10-CM

## 2021-08-03 DIAGNOSIS — R7303 Prediabetes: Secondary | ICD-10-CM

## 2021-08-03 DIAGNOSIS — E782 Mixed hyperlipidemia: Secondary | ICD-10-CM

## 2021-08-04 LAB — COMPREHENSIVE METABOLIC PANEL
ALT: 8 IU/L (ref 0–32)
AST: 21 IU/L (ref 0–40)
Albumin/Globulin Ratio: 2.1 (ref 1.2–2.2)
Albumin: 4.5 g/dL (ref 3.6–4.6)
Alkaline Phosphatase: 83 IU/L (ref 44–121)
BUN/Creatinine Ratio: 29 — ABNORMAL HIGH (ref 12–28)
BUN: 22 mg/dL (ref 8–27)
Bilirubin Total: 0.5 mg/dL (ref 0.0–1.2)
CO2: 22 mmol/L (ref 20–29)
Calcium: 10.1 mg/dL (ref 8.7–10.3)
Chloride: 106 mmol/L (ref 96–106)
Creatinine, Ser: 0.75 mg/dL (ref 0.57–1.00)
Globulin, Total: 2.1 g/dL (ref 1.5–4.5)
Glucose: 113 mg/dL — ABNORMAL HIGH (ref 70–99)
Potassium: 4.4 mmol/L (ref 3.5–5.2)
Sodium: 141 mmol/L (ref 134–144)
Total Protein: 6.6 g/dL (ref 6.0–8.5)
eGFR: 78 mL/min/{1.73_m2} (ref >59)

## 2021-08-04 LAB — HEMOGLOBIN A1C
Est. average glucose Bld gHb Est-mCnc: 134 mg/dL
Hgb A1c MFr Bld: 6.3 % — ABNORMAL HIGH (ref 4.8–5.6)

## 2021-08-04 LAB — LIPID PANEL
Chol/HDL Ratio: 2.7 ratio (ref 0.0–4.4)
Cholesterol, Total: 183 mg/dL (ref 100–199)
HDL: 68 mg/dL (ref >39)
LDL Chol Calc (NIH): 91 mg/dL (ref 0–99)
Triglycerides: 139 mg/dL (ref 0–149)
VLDL Cholesterol Cal: 24 mg/dL (ref 5–40)

## 2021-08-04 LAB — CBC
Hematocrit: 39.9 % (ref 34.0–46.6)
Hemoglobin: 13.4 g/dL (ref 11.1–15.9)
MCH: 30.7 pg (ref 26.6–33.0)
MCHC: 33.6 g/dL (ref 31.5–35.7)
MCV: 92 fL (ref 79–97)
Platelets: 202 10*3/uL (ref 150–450)
RBC: 4.36 x10E6/uL (ref 3.77–5.28)
RDW: 12.7 % (ref 11.7–15.4)
WBC: 6.9 10*3/uL (ref 3.4–10.8)

## 2021-08-04 LAB — TSH: TSH: 5.8 u[IU]/mL — ABNORMAL HIGH (ref 0.450–4.500)

## 2021-08-09 ENCOUNTER — Encounter (HOSPITAL_BASED_OUTPATIENT_CLINIC_OR_DEPARTMENT_OTHER): Payer: Self-pay | Admitting: Surgery

## 2021-08-09 ENCOUNTER — Other Ambulatory Visit: Payer: Self-pay

## 2021-08-10 ENCOUNTER — Encounter: Payer: Medicare PPO | Admitting: Physician Assistant

## 2021-08-10 DIAGNOSIS — I70203 Unspecified atherosclerosis of native arteries of extremities, bilateral legs: Secondary | ICD-10-CM | POA: Diagnosis not present

## 2021-08-10 DIAGNOSIS — B353 Tinea pedis: Secondary | ICD-10-CM | POA: Diagnosis not present

## 2021-08-10 DIAGNOSIS — B351 Tinea unguium: Secondary | ICD-10-CM | POA: Diagnosis not present

## 2021-08-16 ENCOUNTER — Encounter: Payer: Self-pay | Admitting: Physician Assistant

## 2021-08-16 ENCOUNTER — Ambulatory Visit (INDEPENDENT_AMBULATORY_CARE_PROVIDER_SITE_OTHER): Payer: Medicare PPO | Admitting: Physician Assistant

## 2021-08-16 VITALS — BP 114/71 | HR 89 | Temp 97.7°F | Ht 68.0 in | Wt 172.0 lb

## 2021-08-16 DIAGNOSIS — R7303 Prediabetes: Secondary | ICD-10-CM | POA: Diagnosis not present

## 2021-08-16 DIAGNOSIS — Z0000 Encounter for general adult medical examination without abnormal findings: Secondary | ICD-10-CM | POA: Diagnosis not present

## 2021-08-16 DIAGNOSIS — E782 Mixed hyperlipidemia: Secondary | ICD-10-CM

## 2021-08-16 DIAGNOSIS — R7989 Other specified abnormal findings of blood chemistry: Secondary | ICD-10-CM | POA: Diagnosis not present

## 2021-08-16 MED ORDER — ENSURE PRE-SURGERY PO LIQD: 296.0000 mL | Freq: Once | ORAL | Status: DC

## 2021-08-16 NOTE — Progress Notes (Signed)

## 2021-08-17 DIAGNOSIS — C50912 Malignant neoplasm of unspecified site of left female breast: Secondary | ICD-10-CM | POA: Diagnosis not present

## 2021-08-17 DIAGNOSIS — R590 Localized enlarged lymph nodes: Secondary | ICD-10-CM | POA: Diagnosis not present

## 2021-08-17 NOTE — H&P (Signed)
PROVIDER: Beverlee Nims, MD  MRN: [id number] DOB: May 27, 1934  Subjective   Chief Complaint: Breast Cancer   History of Present Illness: Jamie [name] is a 87 y.o. female who is seenas an office consultation for evaluation of Breast Cancer .   This is an 87 year old female who is here  for evaluation of the left breast with carcinoma of the left breast. She is accompanied by family. She has Parkinson disease and some mild cognitive impairment. She is uncertain of how long she has felt the mass but has been at least present several months in the left breast. She underwent imaging of the breast showing a large mass 2.9 cm in size. There is thickening of the skin on examination. She also has multiple enlarged lymph nodes in the axilla. She underwent a biopsy of the mass in the left breast. This showed invasive ductal carcinoma which was grade 2. The lymph node was positive for malignancy as well. HER2/neu was 100% ER positive, 20% PR positive, HER2 positive, and had a Ki-67 of 40%. She has had increased regarding her breast. She has no cardiopulmonary issues. She denies nipple discharge.  Review of Systems: A complete review of systems was obtained from the patient. I have reviewed this information and discussed as appropriate with the patient. See HPI as well for other ROS.  ROS   Medical History: Past Medical History:  Diagnosis Date   Anxiety   Arthritis   H/O: hysterectomy  Unknown date   Hyperlipidemia   Patient Active Problem List  Diagnosis   History of hysterectomy for benign disease   Malignant neoplasm of lower-inner quadrant of left breast in female, estrogen receptor positive (CMS-HCC)   Parkinson's disease (CMS-HCC)   Spinal stenosis of lumbar region   White coat syndrome without diagnosis of hypertension   Major neurocognitive disorder due to multiple etiologies (CMS-HCC)   Greater trochanteric bursitis of right hip   Essential hypertension   Age-related  osteoporosis without current pathological fracture   Abnormality of gait   Past Surgical History:  Procedure Laterality Date   TONSILLECTOMY N/A  Unknown date    Allergies  Allergen Reactions   Metronidazole Rash  Possible cause of rash   Current Outpatient Medications on File Prior to Visit  Medication Sig Dispense Refill   carbidopa-levodopa (SINEMET) 25-100 mg tablet Take 2 tablets by mouth 3 (three) times daily   mirtazapine (REMERON) 7.5 MG tablet Take 7.5 mg by mouth at bedtime   rosuvastatin (CRESTOR) 5 MG tablet Take 5 mg by mouth at bedtime   No current facility-administered medications on file prior to visit.   Family History  Problem Relation Age of Onset   Breast cancer Mother    Social History   Tobacco Use  Smoking Status Never  Smokeless Tobacco Never    Social History   Socioeconomic History   Marital status: Widowed  Tobacco Use   Smoking status: Never   Smokeless tobacco: Never  Vaping Use   Vaping Use: Never used  Substance and Sexual Activity   Alcohol use: Never   Drug use: Never   Sexual activity: Defer   Objective:  There were no vitals filed for this visit.  There is no height or weight on file to calculate BMI.  Physical Exam   She appears well on exam  Left breast there is a mass in the inner lower quadrant. There is some edema and thickening of the skin as well as ecchymosis from biopsy. There is axillary  adenopathy. The nipple-areolar complex is normal.  Labs, Imaging and Diagnostic Testing: I have reviewed her mammograms and ultrasound.  Assessment and Plan:   Left breast invasive ductal carcinoma with positive lymph nodes  We have discussed her this morning in our multidisciplinary breast cancer conference. I had a long discussion with the patient and her family regarding the diagnosis. Given the size of her left breast cancer and her age , a left mastectomy and targeted lymph node dissection is recommended. I discussed  the reasonings for this with him in detail. I explained the surgical procedure in detail. We discussed the risks which includes but is not limited to bleeding, infection, injury to surrounding structures, the need for drains postoperatively, chronic arm swelling, cardiopulmonary issues, postoperative recovery, etc. I explained that the drain will be in for several weeks postoperatively. They will discuss things also with medical and radiation oncology and then we will determine how they want to proceed. We will go ahead and tentatively schedule surgery per their request.

## 2021-08-18 ENCOUNTER — Ambulatory Visit (HOSPITAL_BASED_OUTPATIENT_CLINIC_OR_DEPARTMENT_OTHER): Payer: Medicare PPO | Admitting: Anesthesiology

## 2021-08-18 ENCOUNTER — Encounter (HOSPITAL_BASED_OUTPATIENT_CLINIC_OR_DEPARTMENT_OTHER)
Admission: RE | Disposition: A | Discharge status: Home / Self Care | Payer: Self-pay | Source: Ambulatory Visit | Attending: Surgery

## 2021-08-18 ENCOUNTER — Observation Stay (HOSPITAL_BASED_OUTPATIENT_CLINIC_OR_DEPARTMENT_OTHER)
Admission: RE | Admit: 2021-08-18 | Discharge: 2021-08-19 | Disposition: A | Discharge status: Home / Self Care | Payer: Medicare PPO | Source: Ambulatory Visit | Attending: Surgery | Admitting: Surgery

## 2021-08-18 ENCOUNTER — Other Ambulatory Visit: Payer: Self-pay

## 2021-08-18 ENCOUNTER — Encounter (HOSPITAL_BASED_OUTPATIENT_CLINIC_OR_DEPARTMENT_OTHER): Payer: Self-pay | Admitting: Surgery

## 2021-08-18 DIAGNOSIS — C50312 Malignant neoplasm of lower-inner quadrant of left female breast: Secondary | ICD-10-CM | POA: Diagnosis not present

## 2021-08-18 DIAGNOSIS — Z803 Family history of malignant neoplasm of breast: Secondary | ICD-10-CM | POA: Diagnosis not present

## 2021-08-18 DIAGNOSIS — Z170 Estrogen receptor positive status [ER+]: Secondary | ICD-10-CM | POA: Diagnosis not present

## 2021-08-18 DIAGNOSIS — C773 Secondary and unspecified malignant neoplasm of axilla and upper limb lymph nodes: Secondary | ICD-10-CM | POA: Insufficient documentation

## 2021-08-18 DIAGNOSIS — F418 Other specified anxiety disorders: Secondary | ICD-10-CM | POA: Diagnosis not present

## 2021-08-18 DIAGNOSIS — G20 Parkinson's disease: Secondary | ICD-10-CM | POA: Insufficient documentation

## 2021-08-18 DIAGNOSIS — N641 Fat necrosis of breast: Secondary | ICD-10-CM | POA: Insufficient documentation

## 2021-08-18 DIAGNOSIS — Z79899 Other long term (current) drug therapy: Secondary | ICD-10-CM | POA: Insufficient documentation

## 2021-08-18 DIAGNOSIS — G8918 Other acute postprocedural pain: Secondary | ICD-10-CM | POA: Diagnosis not present

## 2021-08-18 DIAGNOSIS — I10 Essential (primary) hypertension: Secondary | ICD-10-CM | POA: Insufficient documentation

## 2021-08-18 DIAGNOSIS — Z9012 Acquired absence of left breast and nipple: Secondary | ICD-10-CM

## 2021-08-18 DIAGNOSIS — C50912 Malignant neoplasm of unspecified site of left female breast: Secondary | ICD-10-CM

## 2021-08-18 DIAGNOSIS — C50911 Malignant neoplasm of unspecified site of right female breast: Secondary | ICD-10-CM | POA: Diagnosis not present

## 2021-08-18 HISTORY — PX: MASTECTOMY W/ SENTINEL NODE BIOPSY: SHX2001

## 2021-08-18 SURGERY — MASTECTOMY WITH SENTINEL LYMPH NODE BIOPSY
Anesthesia: General | Site: Breast | Laterality: Left

## 2021-08-18 MED ORDER — DEXAMETHASONE SODIUM PHOSPHATE 10 MG/ML IJ SOLN
INTRAMUSCULAR | Status: AC
  Filled 2021-08-18: qty 1

## 2021-08-18 MED ORDER — ACETAMINOPHEN 500 MG PO TABS
ORAL_TABLET | ORAL | Status: AC
  Filled 2021-08-18: qty 2

## 2021-08-18 MED ORDER — TRAMADOL HCL 50 MG PO TABS: 50.0000 mg | ORAL_TABLET | Freq: Four times a day (QID) | ORAL | Status: DC | PRN

## 2021-08-18 MED ORDER — ENOXAPARIN SODIUM 40 MG/0.4ML IJ SOSY: 40.0000 mg | PREFILLED_SYRINGE | INTRAMUSCULAR | Status: DC

## 2021-08-18 MED ORDER — ACETAMINOPHEN 500 MG PO TABS
1000.0000 mg | ORAL_TABLET | Freq: Four times a day (QID) | ORAL | Status: DC
  Administered 2021-08-18 – 2021-08-19 (×2): 1000 mg via ORAL
  Filled 2021-08-18 (×2): qty 2

## 2021-08-18 MED ORDER — CEFAZOLIN SODIUM-DEXTROSE 2-4 GM/100ML-% IV SOLN
INTRAVENOUS | Status: AC
  Filled 2021-08-18: qty 100

## 2021-08-18 MED ORDER — BUPIVACAINE HCL (PF) 0.5 % IJ SOLN
INTRAMUSCULAR | Status: DC | PRN
  Administered 2021-08-18: 15 mL

## 2021-08-18 MED ORDER — EPHEDRINE 5 MG/ML INJ
INTRAVENOUS | Status: AC
  Filled 2021-08-18: qty 5

## 2021-08-18 MED ORDER — ATROPINE SULFATE 0.4 MG/ML IV SOLN
INTRAVENOUS | Status: AC
  Filled 2021-08-18: qty 1

## 2021-08-18 MED ORDER — 0.9 % SODIUM CHLORIDE (POUR BTL) OPTIME
TOPICAL | Status: DC | PRN
  Administered 2021-08-18: 200 mL

## 2021-08-18 MED ORDER — FENTANYL CITRATE (PF) 100 MCG/2ML IJ SOLN
50.0000 ug | Freq: Once | INTRAMUSCULAR | Status: AC
  Administered 2021-08-18: 50 ug via INTRAVENOUS

## 2021-08-18 MED ORDER — OXYCODONE HCL 5 MG PO TABS: 5.0000 mg | ORAL_TABLET | Freq: Once | ORAL | Status: DC | PRN

## 2021-08-18 MED ORDER — SUCCINYLCHOLINE CHLORIDE 200 MG/10ML IV SOSY
PREFILLED_SYRINGE | INTRAVENOUS | Status: AC
  Filled 2021-08-18: qty 10

## 2021-08-18 MED ORDER — CEFAZOLIN SODIUM-DEXTROSE 2-4 GM/100ML-% IV SOLN
2.0000 g | INTRAVENOUS | Status: AC
  Administered 2021-08-18: 2 g via INTRAVENOUS

## 2021-08-18 MED ORDER — FENTANYL CITRATE (PF) 100 MCG/2ML IJ SOLN
INTRAMUSCULAR | Status: AC
  Filled 2021-08-18: qty 2

## 2021-08-18 MED ORDER — LACTATED RINGERS IV SOLN: INTRAVENOUS | Status: DC

## 2021-08-18 MED ORDER — PHENYLEPHRINE 80 MCG/ML (10ML) SYRINGE FOR IV PUSH (FOR BLOOD PRESSURE SUPPORT)
PREFILLED_SYRINGE | INTRAVENOUS | Status: AC
  Filled 2021-08-18: qty 10

## 2021-08-18 MED ORDER — DIPHENHYDRAMINE HCL 12.5 MG/5ML PO ELIX: 12.5000 mg | ORAL_SOLUTION | Freq: Four times a day (QID) | ORAL | Status: DC | PRN

## 2021-08-18 MED ORDER — OXYCODONE HCL 5 MG/5ML PO SOLN: 5.0000 mg | Freq: Once | ORAL | Status: DC | PRN

## 2021-08-18 MED ORDER — BUPIVACAINE LIPOSOME 1.3 % IJ SUSP
INTRAMUSCULAR | Status: DC | PRN
  Administered 2021-08-18: 10 mL

## 2021-08-18 MED ORDER — CARBIDOPA-LEVODOPA 25-100 MG PO TABS
2.0000 | ORAL_TABLET | Freq: Three times a day (TID) | ORAL | Status: DC
  Administered 2021-08-18: 2 via ORAL
  Filled 2021-08-18: qty 2

## 2021-08-18 MED ORDER — ONDANSETRON HCL 4 MG/2ML IJ SOLN: 4.0000 mg | Freq: Once | INTRAMUSCULAR | Status: DC | PRN

## 2021-08-18 MED ORDER — ONDANSETRON HCL 4 MG/2ML IJ SOLN: 4.0000 mg | Freq: Four times a day (QID) | INTRAMUSCULAR | Status: DC | PRN

## 2021-08-18 MED ORDER — FENTANYL CITRATE (PF) 100 MCG/2ML IJ SOLN
INTRAMUSCULAR | Status: DC | PRN
  Administered 2021-08-18 (×2): 25 ug via INTRAVENOUS

## 2021-08-18 MED ORDER — FENTANYL CITRATE (PF) 100 MCG/2ML IJ SOLN
25.0000 ug | INTRAMUSCULAR | Status: DC | PRN
  Administered 2021-08-18 (×3): 25 ug via INTRAVENOUS

## 2021-08-18 MED ORDER — MIRTAZAPINE 7.5 MG PO TABS
7.5000 mg | ORAL_TABLET | Freq: Every day | ORAL | Status: DC
  Administered 2021-08-18: 7.5 mg via ORAL
  Filled 2021-08-18: qty 1

## 2021-08-18 MED ORDER — ONDANSETRON 4 MG PO TBDP: 4.0000 mg | ORAL_TABLET | Freq: Four times a day (QID) | ORAL | Status: DC | PRN

## 2021-08-18 MED ORDER — CHLORHEXIDINE GLUCONATE CLOTH 2 % EX PADS: 6.0000 | MEDICATED_PAD | Freq: Once | CUTANEOUS | Status: DC

## 2021-08-18 MED ORDER — DIPHENHYDRAMINE HCL 50 MG/ML IJ SOLN: 12.5000 mg | Freq: Four times a day (QID) | INTRAMUSCULAR | Status: DC | PRN

## 2021-08-18 MED ORDER — DEXAMETHASONE SODIUM PHOSPHATE 4 MG/ML IJ SOLN
INTRAMUSCULAR | Status: DC | PRN
  Administered 2021-08-18: 5 mg via INTRAVENOUS

## 2021-08-18 MED ORDER — PROPOFOL 500 MG/50ML IV EMUL
INTRAVENOUS | Status: DC | PRN
  Administered 2021-08-18: 200 ug/kg/min via INTRAVENOUS

## 2021-08-18 MED ORDER — PROPOFOL 10 MG/ML IV BOLUS
INTRAVENOUS | Status: AC
  Filled 2021-08-18: qty 20

## 2021-08-18 MED ORDER — ONDANSETRON HCL 4 MG/2ML IJ SOLN
INTRAMUSCULAR | Status: AC
  Filled 2021-08-18: qty 2

## 2021-08-18 MED ORDER — SODIUM CHLORIDE 0.9 % IV SOLN: INTRAVENOUS | Status: DC

## 2021-08-18 MED ORDER — ONDANSETRON HCL 4 MG/2ML IJ SOLN
INTRAMUSCULAR | Status: DC | PRN
  Administered 2021-08-18: 4 mg via INTRAVENOUS

## 2021-08-18 MED ORDER — LIDOCAINE HCL (CARDIAC) PF 100 MG/5ML IV SOSY
PREFILLED_SYRINGE | INTRAVENOUS | Status: DC | PRN
  Administered 2021-08-18: 40 mg via INTRAVENOUS

## 2021-08-18 MED ORDER — ROSUVASTATIN CALCIUM 5 MG PO TABS
5.0000 mg | ORAL_TABLET | Freq: Every day | ORAL | Status: DC
  Administered 2021-08-18: 5 mg via ORAL
  Filled 2021-08-18: qty 1

## 2021-08-18 MED ORDER — PROPOFOL 10 MG/ML IV BOLUS
INTRAVENOUS | Status: DC | PRN
  Administered 2021-08-18: 250 mg via INTRAVENOUS

## 2021-08-18 MED ORDER — LIDOCAINE 2% (20 MG/ML) 5 ML SYRINGE
INTRAMUSCULAR | Status: AC
  Filled 2021-08-18: qty 5

## 2021-08-18 MED ORDER — OXYCODONE HCL 5 MG PO TABS
5.0000 mg | ORAL_TABLET | ORAL | Status: DC | PRN
  Administered 2021-08-18: 5 mg via ORAL
  Filled 2021-08-18: qty 1

## 2021-08-18 MED ORDER — HYDROMORPHONE HCL 1 MG/ML IJ SOLN: 0.5000 mg | INTRAMUSCULAR | Status: DC | PRN

## 2021-08-18 MED ORDER — ACETAMINOPHEN 500 MG PO TABS
1000.0000 mg | ORAL_TABLET | ORAL | Status: AC
  Administered 2021-08-18: 1000 mg via ORAL

## 2021-08-18 SURGICAL SUPPLY — 44 items
ADH SKN CLS APL DERMABOND .7 (GAUZE/BANDAGES/DRESSINGS) ×2
APL PRP STRL LF DISP 70% ISPRP (MISCELLANEOUS) ×2
APPLIER CLIP 9.375 MED OPEN (MISCELLANEOUS) ×3
APR CLP MED 9.3 20 MLT OPN (MISCELLANEOUS) ×2
BIOPATCH RED 1 DISK 7.0 (GAUZE/BANDAGES/DRESSINGS) ×2 IMPLANT
BLADE SURG 15 STRL LF DISP TIS (BLADE) ×2 IMPLANT
BLADE SURG 15 STRL SS (BLADE) ×3
CANISTER SUCT 1200ML W/VALVE (MISCELLANEOUS) ×3 IMPLANT
CHLORAPREP W/TINT 26 (MISCELLANEOUS) ×3 IMPLANT
CLIP APPLIE 9.375 MED OPEN (MISCELLANEOUS) ×1 IMPLANT
COVER BACK TABLE 60X90IN (DRAPES) ×3 IMPLANT
COVER MAYO STAND STRL (DRAPES) ×3 IMPLANT
COVER PROBE W GEL 5X96 (DRAPES) ×3 IMPLANT
DERMABOND ADVANCED (GAUZE/BANDAGES/DRESSINGS) ×1
DERMABOND ADVANCED .7 DNX12 (GAUZE/BANDAGES/DRESSINGS) ×2 IMPLANT
DRAIN CHANNEL 19F RND (DRAIN) ×3 IMPLANT
DRAPE LAPAROSCOPIC ABDOMINAL (DRAPES) ×3 IMPLANT
DRAPE UTILITY XL STRL (DRAPES) ×3 IMPLANT
DRSG TEGADERM 2-3/8X2-3/4 SM (GAUZE/BANDAGES/DRESSINGS) ×2 IMPLANT
ELECT REM PT RETURN 9FT ADLT (ELECTROSURGICAL) ×3
ELECTRODE REM PT RTRN 9FT ADLT (ELECTROSURGICAL) ×2 IMPLANT
EVACUATOR SILICONE 100CC (DRAIN) ×3 IMPLANT
GLOVE SURG SIGNA 7.5 PF LTX (GLOVE) ×3 IMPLANT
GOWN STRL REUS W/ TWL LRG LVL3 (GOWN DISPOSABLE) ×2 IMPLANT
GOWN STRL REUS W/ TWL XL LVL3 (GOWN DISPOSABLE) ×2 IMPLANT
GOWN STRL REUS W/TWL LRG LVL3 (GOWN DISPOSABLE) ×3
GOWN STRL REUS W/TWL XL LVL3 (GOWN DISPOSABLE) ×3
NDL HYPO 25X1 1.5 SAFETY (NEEDLE) ×2 IMPLANT
NEEDLE HYPO 25X1 1.5 SAFETY (NEEDLE) ×3 IMPLANT
NS IRRIG 1000ML POUR BTL (IV SOLUTION) ×3 IMPLANT
PACK BASIN DAY SURGERY FS (CUSTOM PROCEDURE TRAY) ×3 IMPLANT
PENCIL SMOKE EVACUATOR (MISCELLANEOUS) ×3 IMPLANT
PIN SAFETY STERILE (MISCELLANEOUS) ×3 IMPLANT
SLEEVE SCD COMPRESS KNEE MED (STOCKING) ×3 IMPLANT
SPONGE T-LAP 18X18 ~~LOC~~+RFID (SPONGE) ×3 IMPLANT
SUT ETHILON 2 0 FS 18 (SUTURE) ×2 IMPLANT
SUT MNCRL AB 4-0 PS2 18 (SUTURE) ×3 IMPLANT
SUT SILK 2 0 SH (SUTURE) ×2 IMPLANT
SUT VICRYL 3-0 CR8 SH (SUTURE) ×4 IMPLANT
SYR BULB EAR ULCER 3OZ GRN STR (SYRINGE) ×1 IMPLANT
SYR CONTROL 10ML LL (SYRINGE) ×4 IMPLANT
TOWEL GREEN STERILE FF (TOWEL DISPOSABLE) ×3 IMPLANT
TUBE CONNECTING 20X1/4 (TUBING) ×3 IMPLANT
YANKAUER SUCT BULB TIP NO VENT (SUCTIONS) ×3 IMPLANT

## 2021-08-18 NOTE — Progress Notes (Signed)
Assisted Dr. Brock with left, pectoralis, ultrasound guided block. Side rails up, monitors on throughout procedure. See vital signs in flow sheet. Tolerated Procedure well. 

## 2021-08-18 NOTE — Progress Notes (Signed)
Patient Care Team: Lorrene Reid, PA-C as PCP - General (Physician Assistant) [name], MD as Consulting Physician (Cardiology) Sydnee Levans, MD as Referring Physician (Dermatology) Rehabilitation, Causey Tat, Eustace Quail, DO as Consulting Physician (Neurology) Mauro Kaufmann, RN as Oncology Nurse Navigator Carlynn Spry, Charlott Holler, RN as Oncology Nurse Navigator Coralie Keens, MD as Consulting Physician (General Surgery) Nicholas Lose, MD as Consulting Physician (Hematology and Oncology) Eppie Gibson, MD as Attending Physician (Radiation Oncology) Lorrene Reid, PA-C as Physician Assistant (Physician Assistant)  DIAGNOSIS:  Encounter Diagnosis  Name Primary?   Malignant neoplasm of lower-inner quadrant of left breast in female, estrogen receptor positive ([place])     SUMMARY OF ONCOLOGIC HISTORY: Oncology History  Malignant neoplasm of lower-inner quadrant of left breast in female, estrogen receptor positive ([place])  07/11/2021 Initial Diagnosis   Skin changes in the left breast, screening mammogram revealed thickening of the lower medial quadrant along with enlarged lymph node in the axilla (2 additional lymph nodes) by ultrasound breast tumor measured 3.9 cm, biopsy: Grade 2 IDC with lymphovascular invasion ER 100%, PR 100%, HER2 positive Ki-67 40%, lymph node biopsy positive   07/13/2021 Cancer Staging   Staging form: Breast, AJCC 8th Edition - Clinical: Stage IB (cT2, cN1, cM0, G2, ER+, PR+, HER2+) - Signed by Nicholas Lose, MD on 07/13/2021 Stage prefix: Initial diagnosis Histologic grading system: 3 grade system    Genetic Testing   [facility] CustomNext Panel was Negative. Report date is 07/24/2021.  The CustomNext panel offered by [employer] includes sequencing, rearrangement, and RNA analysis for the following 41 genes: APC, ATM, BAP1, BARD1, BMPR1A, BRCA1, BRCA2, BRIP1, CDH1, CDK4, CDKN2A, CHEK2, DICER1, MLH1, MSH2, MSH6, MUTYH, NBN, NF1, NTHL1, PALB2, PMS2,  POT1, PTEN, RAD51C, RAD51D, RB1, RECQL, SMAD4, SMARCA4, STK11 and TP53 (sequencing and deletion/duplication); AXIN2, EGFR, HOXB13, MITF, MSH3, POLD1 and POLE (sequencing only); EPCAM and GREM1 (deletion/duplication only).      CHIEF COMPLIANT: Follow-up after surgery  INTERVAL HISTORY: Jamie [name] is a  87 y.o. female is here because of recent diagnosis of left breast.  Patient found thickening of the left breast. She presents to the clinic today for a follow-up after surgery.  She tolerated surgery extremely well without any problems or concerns.  She denies any pain or discomfort.  She is accompanied today by her son and daughter.   ALLERGIES:  is allergic to flagyl [metronidazole].  MEDICATIONS:  Current Outpatient Medications  Medication Sig Dispense Refill   letrozole (FEMARA) 2.5 MG tablet Take 1 tablet (2.5 mg total) by mouth daily. 90 tablet 3   carbidopa-levodopa (SINEMET IR) 25-100 MG tablet Take 2 tablets by mouth 3 (three) times daily. 540 tablet 2   Coenzyme Q10 (COQ-10) 100 MG CAPS Take 1 tablet by mouth daily.     mirtazapine (REMERON) 7.5 MG tablet Take 1 tablet (7.5 mg total) by mouth at bedtime. 90 tablet 1   Multiple Vitamin (MULTIVITAMIN WITH MINERALS) TABS tablet Take 1 tablet by mouth daily.     Multiple Vitamins-Minerals (PRESERVISION AREDS 2 PO) Take 1 tablet by mouth in the morning and at bedtime.      NON FORMULARY Herblax as needed     NON FORMULARY Cholesterol Reduction     rosuvastatin (CRESTOR) 5 MG tablet TAKE 1 TABLET BY MOUTH AT BEDTIME 90 tablet 1   traMADol (ULTRAM) 50 MG tablet Take 1 tablet (50 mg total) by mouth every 6 (six) hours as needed for moderate pain. 20 tablet 0  Vitamin E 100 units TABS Take 250 mg by mouth. Once a day     No current facility-administered medications for this visit.    PHYSICAL EXAMINATION: ECOG PERFORMANCE STATUS: 1 - Symptomatic but completely ambulatory  Vitals:   09/01/21 1150  BP: (!) 149/65  Pulse: 78   Resp: 18  Temp: 97.7 F (36.5 C)  SpO2: 97%   Filed Weights   09/01/21 1150  Weight: 175 lb 9.6 oz (79.7 kg)      LABORATORY DATA:  I have reviewed the data as listed    Latest Ref Rng & Units 08/03/2021    8:55 AM 07/13/2021    8:31 AM 11/24/2020   10:12 AM  CMP  Glucose 70 - 99 mg/dL 113  126  107   BUN 8 - 27 mg/dL _0 Creatinine 0.57 - 1.00 mg/dL 0.75  0.86  0.79   Sodium 134 - 144 mmol/L 141  140  141   Potassium 3.5 - 5.2 mmol/L 4.4  4.0  4.2   Chloride 96 - 106 mmol/L 106  107  103   CO2 20 - 29 mmol/L _1 Calcium 8.7 - 10.3 mg/dL 10.1  11.0  10.9   Total Protein 6.0 - 8.5 g/dL 6.6  7.3  6.5   Total Bilirubin 0.0 - 1.2 mg/dL 0.5  0.6  0.5   Alkaline Phos 44 - 121 IU/L 83  81  92   AST 0 - 40 IU/L _2 ALT 0 - 32 IU/L _3 Lab Results  Component Value Date   WBC 6.9 08/03/2021   HGB 13.4 08/03/2021   HCT 39.9 08/03/2021   MCV 92 08/03/2021   PLT 202 08/03/2021   NEUTROABS 4.5 07/13/2021    ASSESSMENT & PLAN:  Malignant neoplasm of lower-inner quadrant of left breast in female, estrogen receptor positive ([place]) 07/09/2021:Skin changes in the left breast, screening mammogram revealed thickening of the lower medial quadrant along with enlarged lymph node in the axilla (2 additional lymph nodes) by ultrasound breast tumor measured 3.9 cm, biopsy: Grade 2 IDC with lymphovascular invasion ER 100%, PR 100%, HER2 positive Ki-67 40%, lymph node biopsy positive  08/18/21: Left Mastectomy: Grade 3 IDC with micropapillary features with DCIS 5.8 cm 7/7 LN Positives, involves dermal stroma and lymphatics with focal Extra nodal invasion  Pathology counseling: I discussed the final pathology report of the patient provided  a copy of this report. I discussed the margins as well as lymph node surgeries. We also discussed the final staging along with previously performed ER/PR and HER-2/neu testing.   Treatment Plan: 1. adjuvant Herceptin with  aromatase inhibitors 2.  Adjuvant radiation 3. Scans  We will discuss the results of the scans on follow-up.  Orders Placed This Encounter  Procedures   CT CHEST ABDOMEN PELVIS W CONTRAST    Standing Status:   Future    Standing Expiration Date:   09/02/2022    Order Specific Question:   Preferred imaging location?    Answer:   [facility]    Order Specific Question:   Release to patient    Answer:   Immediate    Order Specific Question:   Is Oral Contrast requested for this exam?    Answer:   Yes, Per Radiology protocol   NM Bone Scan Whole Body    Standing Status:  Future    Standing Expiration Date:   09/01/2022    Order Specific Question:   If indicated for the ordered procedure, I authorize the administration of a radiopharmaceutical per Radiology protocol    Answer:   Yes    Order Specific Question:   Preferred imaging location?    Answer:   [facility]    Order Specific Question:   Release to patient    Answer:   Immediate   ECHOCARDIOGRAM COMPLETE    Standing Status:   Future    Standing Expiration Date:   09/02/2022    Order Specific Question:   Where should this test be performed    Answer:   [facility]    Order Specific Question:   Perflutren DEFINITY (image enhancing agent) should be administered unless hypersensitivity or allergy exist    Answer:   Administer Perflutren    Order Specific Question:   Reason for exam-Echo    Answer:   Chemo  Z09    Order Specific Question:   Release to patient    Answer:   Immediate   The patient has a good understanding of the overall plan. she agrees with it. she will call with any problems that may develop before the next visit here. Total time spent: 30 mins including face to face time and time spent for planning, charting and co-ordination of care   Harriette Ohara, MD 09/01/21    I Gardiner Coins am scribing for Dr. Lindi Adie  I have reviewed the above documentation for accuracy and completeness, and I  agree with the above.

## 2021-08-18 NOTE — Anesthesia Procedure Notes (Signed)
Procedure Name: LMA Insertion Date/Time: 08/18/2021 12:23 PM  Performed by: Willa Frater, CRNAPre-anesthesia Checklist: Patient identified, Emergency Drugs available, Suction available and Patient being monitored Patient Re-evaluated:Patient Re-evaluated prior to induction Oxygen Delivery Method: Circle system utilized Preoxygenation: Pre-oxygenation with 100% oxygen Induction Type: IV induction Ventilation: Mask ventilation without difficulty LMA: LMA inserted LMA Size: 4.0 Number of attempts: 1 Airway Equipment and Method: Bite block Placement Confirmation: positive ETCO2 Tube secured with: Tape Dental Injury: Teeth and Oropharynx as per pre-operative assessment

## 2021-08-18 NOTE — Anesthesia Procedure Notes (Signed)
Anesthesia Regional Block: Pectoralis block   Pre-Anesthetic Checklist: , timeout performed,  Correct Patient, Correct Site, Correct Laterality,  Correct Procedure, Correct Position, site marked,  Risks and benefits discussed,  Surgical consent,  Pre-op evaluation,  At surgeon's request and post-op pain management  Laterality: Left  Prep: chloraprep       Needles:  Injection technique: Single-shot  Needle Type: Echogenic Needle     Needle Length: 10cm  Needle Gauge: 21     Additional Needles:   Narrative:  Start time: 08/18/2021 11:46 AM End time: 08/18/2021 11:50 AM Injection made incrementally with aspirations every 5 mL.  Performed by: Personally  Anesthesiologist: Audry Pili, MD  Additional Notes: No pain on injection. No increased resistance to injection. Injection made in 5cc increments. Good needle visualization. Patient tolerated the procedure well.

## 2021-08-18 NOTE — Op Note (Signed)
Jamie [name] 08/18/2021   Pre-op Diagnosis: LEFT BREAST CANCER     Post-op Diagnosis: same  Procedure(s): LEFT MASTECTOMY WITH TARGETED RADIOACTIVE SEED GUIDED DEEP AXILLARY LYMPH NODE DISSECTION  Surgeon(s): Coralie Keens, MD  Anesthesia: General  Staff:  Circulator: Izora Ribas, RN Scrub Person: Ward, Scarlette Calico  Estimated Blood Loss: Minimal               Specimens: SENT TO PATH   Indications: This is an 87 year old female with a large palpable left breast cancer and positive lymph node on recent biopsy.  The decision has been made to proceed with a left mastectomy and targeted lymph node dissection  Findings: The patient has significant edema in the inner lower quadrant of the left breast at the area of the malignancy.  Clinically it seems to go down toward the chest wall and muscle as well.  There were several large, firm lymph nodes removed from the axilla  Procedure: The patient is brought to operating room identifies a correct patient.  She was placed upon on the operating table and general anesthesia was induced.  Her left breast and chest were then prepped and draped in usual sterile fashion.  I performed an elliptical incision transversely across the left breast and chest medial to lateral.  I had to go lower on the medial part of the breast secondary to the edema of the skin and possible skin involvement.  I then curved the incision toward the axilla.  I then dissected down circumferentially to the breast tissue with electrocautery.  I first created the superior skin flap staying just underneath the skin and dermis and going superiorly toward the clavicle and then down toward the chest wall with the cautery.  This was performed medial to lateral across the entire superior flap.  I then dissected the inferior flap staying just underneath the skin and going below the inframammary ridge as the tumor was quite low in the breast.  I again dissected medial to  lateral creating the inferior skin flap.  I then took the dissection toward the axilla.  I then dissected the breast off of the chest wall with the cautery.  Where the tumor was located it appeared to be potentially involving some underlying muscle which was taken with the specimen.  I then continued dissection to the lateral edge of the pectoralis muscles and completed the mastectomy.  The mastectomy specimen was then marked with a silk suture at the lateral margin.  I achieved hemostasis on the chest wall with the cautery controlling several small perforating vessels. Next using the neoprobe I dissected into the deep axilla.  There were several large palpable lymph nodes.  Identified the lymph node with the radioactive seed in it with the neoprobe and excised this.  It was x-rayed separately confirming the radioactive seed and previous biopsy clip were in the node.  I then elected to go ahead and remove the the multiple large lymph nodes in the axilla which were easily palpable.  I did this with both the cautery and surgical clips.  The lymph node packet was sent to pathology for evaluation.  I then thoroughly irrigated the chest wall and axilla again with saline.  Hemostasis appeared to be achieved.  I made a separate skin incision and placed a 63 French Blake drain into the mastectomy site and axilla.  This was sutured in place with a 2-0 nylon suture.  I then closed the subcutaneous tissue with interrupted 3-0 Vicryl  sutures and closed the skin with a running 4-0 Monocryl.  Dermabond and a dressing were then applied.  The drain was placed to bulb suction.  The patient was then placed in a breast binder.  She tolerated the procedure well.  All the counts were correct at the end of the procedure.  She was then extubated in the operating room and taken in a stable condition to the recovery room.          Coralie Keens   Date: 08/18/2021  Time: 1:41 PM

## 2021-08-18 NOTE — Anesthesia Preprocedure Evaluation (Addendum)
Anesthesia Evaluation  Patient identified by MRN, date of birth, ID band Patient awake    Reviewed: Allergy & Precautions, NPO status , Patient's Chart, lab work & pertinent test results  History of Anesthesia Complications Negative for: history of anesthetic complications  Airway Mallampati: II  TM Distance: >3 FB Neck ROM: Full    Dental  (+) Dental Advisory Given, Partial Upper   Pulmonary neg pulmonary ROS,    Pulmonary exam normal        Cardiovascular hypertension (white coat syndrome), Normal cardiovascular exam     Neuro/Psych PSYCHIATRIC DISORDERS Anxiety Depression  Neuromuscular disease (Parkinsons)    GI/Hepatic Neg liver ROS, PUD,   Endo/Other   Pre-DM   Renal/GU negative Renal ROS     Musculoskeletal  (+) Arthritis ,   Abdominal   Peds  Hematology negative hematology ROS (+)   Anesthesia Other Findings   Reproductive/Obstetrics  Breast cancer                             Anesthesia Physical Anesthesia Plan  ASA: 3  Anesthesia Plan: General   Post-op Pain Management: Tylenol PO (pre-op)* and Regional block*   Induction: Intravenous  PONV Risk Score and Plan: 3 and Treatment may vary due to age or medical condition, Ondansetron and TIVA  Airway Management Planned: LMA  Additional Equipment: None  Intra-op Plan:   Post-operative Plan: Extubation in OR  Informed Consent: I have reviewed the patients History and Physical, chart, labs and discussed the procedure including the risks, benefits and alternatives for the proposed anesthesia with the patient or authorized representative who has indicated his/her understanding and acceptance.     Dental advisory given  Plan Discussed with: CRNA and Anesthesiologist  Anesthesia Plan Comments:        Anesthesia Quick Evaluation

## 2021-08-18 NOTE — Anesthesia Postprocedure Evaluation (Signed)
Anesthesia Post Note  Patient: Jamie [name]  Procedure(s) Performed: LEFT MASTECTOMY WITH TARGETED LYMPH NODE DISSECTION (Left: Breast)     Patient location during evaluation: PACU Anesthesia Type: General Level of consciousness: awake and alert Pain management: pain level controlled Vital Signs Assessment: post-procedure vital signs reviewed and stable Respiratory status: spontaneous breathing, nonlabored ventilation and respiratory function stable Cardiovascular status: stable and blood pressure returned to baseline Anesthetic complications: no   No notable events documented.  Last Vitals:  Vitals:   08/18/21 1430 08/18/21 1445  BP: (!) 171/79 (!) 175/77  Pulse: (!) 57 62  Resp: 13 14  Temp:    SpO2: 100% 100%    Last Pain:  Vitals:   08/18/21 1425  TempSrc:   PainSc: [place]

## 2021-08-18 NOTE — Interval H&P Note (Signed)
History and Physical Interval Note:no change in h and P  08/18/2021 11:45 AM  Jamie [name]  has presented today for surgery, with the diagnosis of LEFT BREAST CANCER.  The various methods of treatment have been discussed with the patient and family. After consideration of risks, benefits and other options for treatment, the patient has consented to  Procedure(s): LEFT MASTECTOMY (Left) TARGETED NODE DISSECTION (Left) as a surgical intervention.  The patient's history has been reviewed, patient examined, no change in status, stable for surgery.  I have reviewed the patient's chart and labs.  Questions were answered to the patient's satisfaction.     Coralie Keens

## 2021-08-18 NOTE — Transfer of Care (Signed)
Immediate Anesthesia Transfer of Care Note  Patient: Jamie [name]  Procedure(s) Performed: LEFT MASTECTOMY WITH TARGETED LYMPH NODE DISSECTION (Left: Breast)  Patient Location: PACU  Anesthesia Type:GA combined with regional for post-op pain  Level of Consciousness: awake, alert , oriented, drowsy and patient cooperative  Airway & Oxygen Therapy: Patient Spontanous Breathing and Patient connected to face mask oxygen  Post-op Assessment: Report given to RN and Post -op Vital signs reviewed and stable  Post vital signs: Reviewed and stable  Last Vitals:  Vitals Value Taken Time  BP    Temp    Pulse    Resp 20 08/18/21 1351  SpO2    Vitals shown include unvalidated device data.  Last Pain:  Vitals:   08/18/21 1112  TempSrc: Oral  PainSc: 0-No pain         Complications: No notable events documented.

## 2021-08-19 ENCOUNTER — Encounter (HOSPITAL_BASED_OUTPATIENT_CLINIC_OR_DEPARTMENT_OTHER): Payer: Self-pay | Admitting: Surgery

## 2021-08-19 MED ORDER — TRAMADOL HCL 50 MG PO TABS: 50.0000 mg | ORAL_TABLET | Freq: Four times a day (QID) | ORAL | 0 refills | Status: DC | PRN

## 2021-08-19 NOTE — Discharge Summary (Signed)
Physician Discharge Summary  Patient ID: KHADEEJA ELDEN MRN: [id number] DOB/AGE: 1934-05-30 87 y.o.  Admit date: 08/18/2021 Discharge date: 08/19/2021  Admission Diagnoses:  Discharge Diagnoses:  Principal Problem:   S/P left mastectomy   Discharged Condition: good  Hospital Course: uneventful post op recovery.  Discharged home POD#1  Consults: None  Significant Diagnostic Studies:   Treatments: surgery: left mastectomy and targeted lymph node dissection  Discharge Exam: Blood pressure (!) (P) 139/58, pulse (P) 72, temperature (P) 97.7 F (36.5 C), resp. rate (P) 16, height '5\' 8"'$  (1.727 m), weight 77.9 kg, SpO2 (P) 97 %. General appearance: alert, cooperative, and no distress Resp: clear to auscultation bilaterally Cardio: regular rate and rhythm, S1, S2 normal, no murmur, click, rub or gallop Incision/Wound: left mastectomy flaps viable, no hematoma, drain serosang  Disposition: Discharge disposition: 01-Home or Self Care        Allergies as of 08/19/2021       Reactions   Flagyl [metronidazole] Rash   Possible cause of rash         Medication List     TAKE these medications    carbidopa-levodopa 25-100 MG tablet Commonly known as: SINEMET IR Take 2 tablets by mouth 3 (three) times daily.   CoQ-10 100 MG Caps Take 1 tablet by mouth daily.   mirtazapine 7.5 MG tablet Commonly known as: REMERON Take 1 tablet (7.5 mg total) by mouth at bedtime.   multivitamin with minerals Tabs tablet Take 1 tablet by mouth daily.   NON FORMULARY Herblax as needed   NON FORMULARY Cholesterol Reduction   PRESERVISION AREDS 2 PO Take 1 tablet by mouth in the morning and at bedtime.   rosuvastatin 5 MG tablet Commonly known as: CRESTOR TAKE 1 TABLET BY MOUTH AT BEDTIME   traMADol 50 MG tablet Commonly known as: ULTRAM Take 1 tablet (50 mg total) by mouth every 6 (six) hours as needed for moderate pain.   Vitamin E 100 units Tabs Take 250 mg by mouth. Once  a day        Follow-up Information     Coralie Keens, MD Follow up on 09/02/2021.   Specialty: General Surgery Contact information: [address] [place] [id number] [phone]                 Signed: Coralie Keens 08/19/2021, 7:14 AM

## 2021-08-19 NOTE — Discharge Instructions (Addendum)
[facility] [place] [phone]  MASTECTOMY: POST OP INSTRUCTIONS  Always review your discharge instruction sheet given to you by the facility where your surgery was performed. IF YOU HAVE DISABILITY OR FAMILY LEAVE FORMS, YOU MUST BRING THEM TO THE OFFICE FOR PROCESSING.   DO NOT GIVE THEM TO YOUR DOCTOR. A prescription for pain medication may be given to you upon discharge.  Take your pain medication as prescribed, if needed.  If narcotic pain medicine is not needed, then you may take acetaminophen (Tylenol) or ibuprofen (Advil) as needed. Take your usually prescribed medications unless otherwise directed. If you need a refill on your pain medication, please contact your pharmacy.  They will contact our office to request authorization.  Prescriptions will not be filled after 5pm or on week-ends. You should follow a light diet the first few days after arrival home, such as soup and crackers, etc.  Resume your normal diet the day after surgery. Most patients will experience some swelling and bruising on the chest and underarm.  Ice packs will help.  Swelling and bruising can take several days to resolve.  It is common to experience some constipation if taking pain medication after surgery.  Increasing fluid intake and taking a stool softener (such as Colace) will usually help or prevent this problem from occurring.  A mild laxative (Milk of Magnesia or Miralax) should be taken according to package instructions if there are no bowel movements after 48 hours. Unless discharge instructions indicate otherwise, leave your bandage dry and in place until your next appointment in 3-5 days.  You may take a limited sponge bath.  No tube baths or showers until the drains are removed.  You may have steri-strips (small skin tapes) in place directly over the incision.  These strips should be left on the skin for 7-10 days.  If your surgeon used skin glue on the incision, you may shower in 24 hours.   The glue will flake off over the next 2-3 weeks.  Any sutures or staples will be removed at the office during your follow-up visit. DRAINS:  If you have drains in place, it is important to keep a list of the amount of drainage produced each day in your drains.  Before leaving the hospital, you should be instructed on drain care.  Call our office if you have any questions about your drains. ACTIVITIES:  You may resume regular (light) daily activities beginning the next day--such as daily self-care, walking, climbing stairs--gradually increasing activities as tolerated.  You may have sexual intercourse when it is comfortable.  Refrain from any heavy lifting or straining until approved by your doctor. You may drive when you are no longer taking prescription pain medication, you can comfortably wear a seatbelt, and you can safely maneuver your car and apply brakes. RETURN TO WORK:  __________________________________________________________ You should see your doctor in the office for a follow-up appointment approximately 3-5 days after your surgery.  Your doctor's nurse will typically make your follow-up appointment when she calls you with your pathology report.  Expect your pathology report 2-3 business days after your surgery.  You may call to check if you do not hear from us after three days.   OTHER INSTRUCTIONS: ______________________________________________________________________________________________ ____________________________________________________________________________________________ WHEN TO CALL YOUR DOCTOR: Fever over 101.0 Nausea and/or vomiting Extreme swelling or bruising Continued bleeding from incision. Increased pain, redness, or drainage from the incision. The clinic staff is available to answer your questions during regular business hours.  Please don't hesitate   to call and ask to speak to one of the nurses for clinical concerns.  If you have a medical emergency, go to the  nearest emergency room or call 911.  A surgeon from [facility] is always on call at the hospital. [address], [address], [place], [place]  [address] ? [address] [address], , [place]   [address] [phone] ? [phone] ? FAX [phone] Web site: [email or web address]  About my Jackson-Pratt Bulb Drain  What is a Jackson-Pratt bulb? A Jackson-Pratt is a soft, round device used to collect drainage. It is connected to a long, thin drainage catheter, which is held in place by one or two small stiches near your surgical incision site. When the bulb is squeezed, it forms a vacuum, forcing the drainage to empty into the bulb.  Emptying the Jackson-Pratt bulb- To empty the bulb: 1. Release the plug on the top of the bulb. 2. Pour the bulb's contents into a measuring container which your nurse will provide. 3. Record the time emptied and amount of drainage. Empty the drain(s) as often as your     doctor or nurse recommends.  Date                  Time                    Amount (Drain 1)                   __________________________________________________  __________________________________________________  __________________________________________________  __________________________________________________  __________________________________________________  __________________________________________________  __________________________________________________  __________________________________________________  Squeezing the Jackson-Pratt Bulb- To squeeze the bulb: 1. Make sure the plug at the top of the bulb is open. 2. Squeeze the bulb tightly in your fist. You will hear air squeezing from the bulb. 3. Replace the plug while the bulb is squeezed. 4. Use a safety pin to attach the bulb to your clothing. This will keep the catheter from     pulling at the bulb insertion site.  When to call your doctor- Call your doctor if: Drain site becomes red, swollen or  hot. You have a fever greater than 101 degrees F. There is oozing at the drain site. Drain falls out (apply a guaze bandage over the drain hole and secure it with tape). Drainage increases daily not related to activity patterns. (You will usually have more drainage when you are active than when you are resting.) Drainage has a bad odor.  

## 2021-08-22 DIAGNOSIS — C50912 Malignant neoplasm of unspecified site of left female breast: Secondary | ICD-10-CM | POA: Diagnosis not present

## 2021-08-22 LAB — SURGICAL PATHOLOGY

## 2021-08-24 ENCOUNTER — Encounter: Payer: Self-pay | Admitting: *Deleted

## 2021-08-31 NOTE — Assessment & Plan Note (Addendum)
07/09/2021:Skin changes in the left breast, screening mammogram revealed thickening of the lower medial quadrant along with enlarged lymph node in the axilla (2 additional lymph nodes) by ultrasound breast tumor measured 3.9 cm, biopsy: Grade 2 IDC with lymphovascular invasion ER 100%, PR 100%, HER2 positive Ki-67 40%, lymph node biopsy positive  08/18/21: Left Mastectomy: Grade 3 IDC with micropapillary features with DCIS 5.8 cm 7/7 LN Positives, involves dermal stroma and lymphatics with focal Extra nodal invasion  Pathology counseling: I discussed the final pathology report of the patient provided  a copy of this report. I discussed the margins as well as lymph node surgeries. We also discussed the final staging along with previously performed ER/PR and HER-2/neu testing.   Treatment Plan: 1. adjuvant Herceptin with aromatase inhibitors 2.  Adjuvant radiation 3. Scans

## 2021-09-01 ENCOUNTER — Other Ambulatory Visit: Payer: Self-pay

## 2021-09-01 ENCOUNTER — Inpatient Hospital Stay: Payer: Medicare PPO | Attending: Hematology and Oncology | Admitting: Hematology and Oncology

## 2021-09-01 ENCOUNTER — Encounter: Payer: Self-pay | Admitting: *Deleted

## 2021-09-01 DIAGNOSIS — C50312 Malignant neoplasm of lower-inner quadrant of left female breast: Secondary | ICD-10-CM | POA: Diagnosis not present

## 2021-09-01 DIAGNOSIS — Z79811 Long term (current) use of aromatase inhibitors: Secondary | ICD-10-CM | POA: Diagnosis not present

## 2021-09-01 DIAGNOSIS — Z170 Estrogen receptor positive status [ER+]: Secondary | ICD-10-CM | POA: Insufficient documentation

## 2021-09-01 DIAGNOSIS — Z9012 Acquired absence of left breast and nipple: Secondary | ICD-10-CM | POA: Diagnosis not present

## 2021-09-01 MED ORDER — LETROZOLE 2.5 MG PO TABS: 2.5000 mg | ORAL_TABLET | Freq: Every day | ORAL | 3 refills | Status: DC

## 2021-09-02 ENCOUNTER — Encounter: Payer: Self-pay | Admitting: *Deleted

## 2021-09-05 ENCOUNTER — Other Ambulatory Visit: Payer: Self-pay | Admitting: Hematology and Oncology

## 2021-09-05 ENCOUNTER — Encounter: Payer: Self-pay | Admitting: *Deleted

## 2021-09-05 DIAGNOSIS — C50312 Malignant neoplasm of lower-inner quadrant of left female breast: Secondary | ICD-10-CM

## 2021-09-05 NOTE — Progress Notes (Signed)
START OFF PATHWAY REGIMEN - Breast   [id number]:Trastuzumab and hyaluronidase-oysk 600 mg/10,000 units SUBQ D1 q21 Days:   A cycle is every 21 days:     Trastuzumab and hyaluronidase-oysk   **Always confirm dose/schedule in your pharmacy ordering system**  Patient Characteristics: Postoperative without Neoadjuvant Therapy (Pathologic Staging), Invasive Disease, Adjuvant Therapy, HER2 Positive, ER Positive, Node Positive, pT3-4, pN1 or Higher Therapeutic Status: Postoperative without Neoadjuvant Therapy (Pathologic Staging) AJCC Grade: G3 AJCC N Category: pN2 AJCC M Category: cM0 ER Status: Positive (+) AJCC 8 Stage Grouping: IIA HER2 Status: Positive (+) Oncotype Dx Recurrence Score: Not Appropriate AJCC T Category: pT3 PR Status: Positive (+) Adjuvant Therapy Status: No Adjuvant Therapy Received Yet or Changing Initial Adjuvant Regimen due to Tolerance Intent of Therapy: Curative Intent, Discussed with Patient

## 2021-09-06 ENCOUNTER — Encounter (HOSPITAL_COMMUNITY): Payer: Self-pay

## 2021-09-07 ENCOUNTER — Encounter: Payer: Self-pay | Admitting: *Deleted

## 2021-09-07 ENCOUNTER — Telehealth: Payer: Self-pay | Admitting: Hematology and Oncology

## 2021-09-07 NOTE — Telephone Encounter (Signed)
Spoke to [place] to confirm upcoming education class for patient gave location and time for appointment and answered all questions she had

## 2021-09-08 ENCOUNTER — Telehealth: Payer: Self-pay | Admitting: Hematology and Oncology

## 2021-09-08 ENCOUNTER — Encounter: Payer: Self-pay | Admitting: Physical Therapy

## 2021-09-08 ENCOUNTER — Ambulatory Visit: Payer: Medicare PPO | Attending: Surgery | Admitting: Physical Therapy

## 2021-09-08 DIAGNOSIS — C50312 Malignant neoplasm of lower-inner quadrant of left female breast: Secondary | ICD-10-CM | POA: Diagnosis not present

## 2021-09-08 DIAGNOSIS — R293 Abnormal posture: Secondary | ICD-10-CM | POA: Insufficient documentation

## 2021-09-08 DIAGNOSIS — M25612 Stiffness of left shoulder, not elsewhere classified: Secondary | ICD-10-CM | POA: Diagnosis not present

## 2021-09-08 DIAGNOSIS — Z483 Aftercare following surgery for neoplasm: Secondary | ICD-10-CM | POA: Diagnosis not present

## 2021-09-08 DIAGNOSIS — Z170 Estrogen receptor positive status [ER+]: Secondary | ICD-10-CM | POA: Insufficient documentation

## 2021-09-08 NOTE — Therapy (Signed)
OUTPATIENT PHYSICAL THERAPY BREAST CANCER POST OP FOLLOW UP   Patient Name: Jamie [name] MRN: [id number] DOB:1934-07-25, 87 y.o., female Today's Date: 09/08/2021   PT End of Session - 09/08/21 1229     Visit Number 2    Number of Visits 3    Date for PT Re-Evaluation 10/06/21    PT Start Time 1100    PT Stop Time 1202    PT Time Calculation (min) 62 min    Activity Tolerance Patient tolerated treatment well    Behavior During Therapy [facility] for tasks assessed/performed             Past Medical History:  Diagnosis Date   Abnormality of gait 04/18/2017   Degenerative joint disease of knee, right 03/19/2018   Injected April 18, 2018 Approved for Durolane R knee 3.4.20. Patient given another steroid injection Jun 24, 2018.  Repeat injection September 09, 2018 repeat injection February 27, 2019 repeat 6/110/20221 bilateral injections given December 01, 2019 Right knee injected April 20, 2020 monovisc May 19, 2018 Repeat steroid injection given June 16, 2020 repeat injection August 24, 2020   Essential hypertension 08/14/2016   Generalized anxiety disorder 06/27/2017   History of dizziness 06/27/2017   History of hysterectomy for benign disease 06/27/2017   History of vitamin D deficiency 08/22/2017   Leukocytes in urine 08/14/2016   Lumbar facet arthropathy 06/27/2017   Major neurocognitive disorder due to multiple etiologies 09/29/2020   Mixed hyperlipidemia 08/22/2017   Osteoarthritis of knee 09/18/2017   Pancolitis 08/14/2016   Parkinson's disease 03/13/2017   Prediabetes 08/22/2017   Right knee pain    Spinal stenosis of lumbar region    Weakness of both hips 04/18/2017   White coat syndrome without diagnosis of hypertension 08/22/2017   Past Surgical History:  Procedure Laterality Date   HYSTERECTOMY ABDOMINAL WITH SALPINGECTOMY     MASTECTOMY W/ SENTINEL NODE BIOPSY Left 08/18/2021   Procedure: LEFT MASTECTOMY WITH TARGETED LYMPH NODE DISSECTION;  Surgeon: Coralie Keens, MD;  Location: [facility];  Service: General;  Laterality: Left;   TONSILLECTOMY     Patient Active Problem List   Diagnosis Date Noted   S/P left mastectomy 08/18/2021   Genetic testing 07/25/2021   Family history of breast cancer 07/14/2021   Malignant neoplasm of lower-inner quadrant of left breast in female, estrogen receptor positive ([place]) 07/11/2021   Mild episode of recurrent major depressive disorder ([place]) 04/06/2021   Age-related osteoporosis without current pathological fracture 04/06/2021   Greater trochanteric bursitis of right hip 01/21/2021   Major neurocognitive disorder due to multiple etiologies 09/29/2020   Right knee pain 05/07/2018   Degenerative joint disease of knee, right 03/19/2018   Osteoarthritis of knee 09/18/2017   Mixed hyperlipidemia 08/22/2017   Prediabetes 08/22/2017   White coat syndrome without diagnosis of hypertension 08/22/2017   History of vitamin D deficiency 08/22/2017   Lumbar facet arthropathy 06/27/2017   History of hysterectomy for benign disease 06/27/2017   Generalized anxiety disorder 06/27/2017   History of dizziness 06/27/2017   Other fatigue 06/27/2017   Overweight (BMI 25.0-29.9) 06/27/2017   Weakness of both hips 04/18/2017   Abnormality of gait 04/18/2017   Spinal stenosis of lumbar region 04/18/2017   Parkinson's disease 03/13/2017   Pancolitis 08/14/2016   Essential hypertension 08/14/2016   Leukocytes in urine 08/14/2016    REFERRING PROVIDER: Dr. Coralie Keens  REFERRING DIAG: Left breast cancer  THERAPY DIAG:  Malignant neoplasm of lower-inner quadrant of left  breast in female, estrogen receptor positive ([place])  Abnormal posture  Aftercare following surgery for neoplasm  Rationale for Evaluation and Treatment Rehabilitation  ONSET DATE: 08/18/2021  SUBJECTIVE:                                                                                                                                                                                            SUBJECTIVE STATEMENT: Patient reports she underwent a left mastectomy and targeted axillary lymph node dissection (7/7 nodes positive) on 08/18/2021. Her drain was removed 09/02/2021. Her son is present due to memory deficits and difficulty processing information due to Parkinson's.  PERTINENT HISTORY:  Patient was diagnosed on 06/27/2021 with left grade 2 invasive ductal carcinoma breast cancer. She underwent a left mastectomy and targeted axillary lymph node dissection (7/7 nodes positive) on 08/18/2021. It is triple positive with a Ki67 of 40%. Patient has cognitive deficits effecting memory and was diagnosed with Parkinson's in 2020. She is triple positive with a Ki67 of 40%.  PATIENT GOALS:  Reassess how my recovery is going related to arm function, pain, and swelling.  PAIN:  Are you having pain? No  PRECAUTIONS: Recent Surgery, left UE Lymphedema risk  ACTIVITY LEVEL / LEISURE: She is walking daily in her yard for about 30 minutes.   OBJECTIVE:   PATIENT SURVEYS:  QUICK DASH:  Quick Dash - 09/08/21 0001     Open a tight or new jar Moderate difficulty    Do heavy household chores (wash walls, wash floors) Mild difficulty    Carry a shopping bag or briefcase Unable    Wash your back Unable    Use a knife to cut food Mild difficulty    Recreational activities in which you take some force or impact through your arm, shoulder, or hand (golf, hammering, tennis) Unable    During the past week, to what extent has your arm, shoulder or hand problem interfered with your normal social activities with family, friends, neighbors, or groups? Modererately    During the past week, to what extent has your arm, shoulder or hand problem limited your work or other regular daily activities Modererately    Arm, shoulder, or hand pain. Mild    Tingling (pins and needles) in your arm, shoulder, or hand None    Difficulty Sleeping No difficulty     DASH Score 47.73 %              OBSERVATIONS:  Left chest incision is healing well. Glue still present but no edema or redness present.   POSTURE:  Forward head and rounded shoulders; slightly increased thoracic kyphosis  LYMPHEDEMA ASSESSMENT:   UPPER EXTREMITY AROM/PROM:   A/PROM RIGHT   eval    Shoulder extension 43  Shoulder flexion 131  Shoulder abduction 134  Shoulder internal rotation 66  Shoulder external rotation 62                          (Blank rows = not tested)   A/PROM LEFT   eval LEFT 09/08/2021  Shoulder extension 60 52  Shoulder flexion 122 90  Shoulder abduction 137 85  Shoulder internal rotation 72 NT  Shoulder external rotation 54 NT                          (Blank rows = not tested)       UPPER EXTREMITY STRENGTH: WFL     LYMPHEDEMA ASSESSMENTS:    LANDMARK RIGHT   eval RIGHT 09/08/2021  10 cm proximal to olecranon process 31 31.6  Olecranon process 25.4 26  10  cm proximal to ulnar styloid process 23.1 22.6  Just proximal to ulnar styloid process 15.9 15.9  Across hand at thumb web space 17.3 17.4  At base of 2nd digit 6 6.1  (Blank rows = not tested)   LANDMARK LEFT   eval LEFT 09/08/2021  10 cm proximal to olecranon process 30.2 31.6  Olecranon process 24.3 25.6  10 cm proximal to ulnar styloid process 22 22.7  Just proximal to ulnar styloid process 16.1 16.2  Across hand at thumb web space 17 17.3  At base of 2nd digit 5.8 6  (Blank rows = not tested)         Surgery type/Date: Left mastectomy and targeted node dissection 08/18/2021 Number of lymph nodes removed: 7 Current/past treatment (chemo, radiation, hormone therapy): none Other symptoms:  Heaviness/tightness No Pain No Pitting edema No Infections No Decreased scar mobility Yes Stemmer sign No   PATIENT EDUCATION:  Education details: Reviewed HEP with pt and son Person educated: Patient and Child(ren) Education method: Explanation, Demonstration, Tactile  cues, Verbal cues, and Handouts Education comprehension: verbalized understanding, returned demonstration, verbal cues required, tactile cues required, and needs further education   HOME EXERCISE PROGRAM:  Reviewed previously given post op HEP  ASSESSMENT:  CLINICAL IMPRESSION: Patient appears to be healing well s/p left mastectomy and targeted node dissection on 08/18/2021. She had 7 axillary nodes removed - all were positive for carcinoma. She is triple positive and will begin Herceptin infusions next week and radiation simulation is scheduled for 09/16/2021. Her incision is healing well with no visible issues, there are no signs of lymphedema, and she reports no pain However, her left shoulder active flexion and abduction are very limited and will likely impact her ability to obtain radiation positioning comfortably. She lives 30 min away and transportation is provided by family. She and her son were instructed in a HEP to improve shoulder ROM and she plans to work diligently on that and return in 1 week to assess progress. It is likely she will improve significantly over the next week and need no further PT except possibly HEP progression.  Pt will benefit from skilled therapeutic intervention to improve on the following deficits: Decreased knowledge of precautions, impaired UE functional use, pain, decreased ROM, postural dysfunction.   PT treatment/interventions: ADL/Self care home management, Therapeutic exercises, Therapeutic activity, Patient/Family education, Self Care, Manual lymph drainage, scar mobilization, Manual therapy, and Re-evaluation     GOALS: Goals reviewed with patient?  Yes and her son  LONG TERM GOALS:  (STG=LTG)  GOALS Name Target Date  Goal status  1 Pt will demonstrate she has regained full shoulder ROM and function post operatively compared to baselines.  Baseline: 09/07/2021 IN PROGRESS  2 Patient will demonstrate she has increased active left shoulder flexion  and abduction to >/= 120 degrees for improved ability to obtain radiation positioning. 10/06/2021 INITIAL     PLAN: PT FREQUENCY/DURATION: 1 more visit in 1 week; if needed and she and family are agreeable, she may benefit from additional visits but for now, we are planning just 1 more visit as she will be starting Herceptin and radiation soon.  PLAN FOR NEXT SESSION: Re-measure left shoulder flexion and abduction. Review HEP again with pt performing exercises. Progress HEP if needed and if necessary.   [facility]  [address]  [place] [address]  [phone]  After Breast Cancer Class It is recommended you attend the ABC class to be educated on lymphedema risk reduction. This class is free of charge and lasts for 1 hour. It is a 1-time class. You will need to download the Webex app either on your phone or computer. We will send you a link the night before or the morning of the class. You should be able to click on that link to join the class. This is not a confidential class. You don't have to turn your camera on, but other participants may be able to see your email address.  Scar massage You can begin gentle scar massage to you incision sites. Gently place one hand on the incision and move the skin (without sliding on the skin) in various directions. Do this for a few minutes and then you can gently massage either coconut oil or vitamin E cream into the scars.  Compression garment You should continue wearing your compression bra until you feel like you no longer have swelling.  Home exercise Program Continue doing the exercises you were given until you feel like you can do them without feeling any tightness at the end.   Walking Program Studies show that 30 minutes of walking per day (fast enough to elevate your heart rate) can significantly reduce the risk of a cancer recurrence. If you can't walk due to other medical reasons, we encourage you to  find another activity you could do (like a stationary bike or water exercise).  Posture After breast cancer surgery, people frequently sit with rounded shoulders posture because it puts their incisions on slack and feels better. If you sit like this and scar tissue forms in that position, you can become very tight and have pain sitting or standing with good posture. Try to be aware of your posture and sit and stand up tall to heal properly.  Follow up PT: It is recommended you return every 3 months for the first 3 years following surgery to be assessed on the SOZO machine for an L-Dex score. This helps prevent clinically significant lymphedema in 95% of patients. These follow up screens are 10 minute appointments that you are not billed for. You are scheduled for November 14, 2021 at 9:00.  Annia Friendly, [place] 09/08/21 12:37 PM

## 2021-09-08 NOTE — Patient Instructions (Signed)
[facility]  [address], [address]  [place] [address]  [phone]  After Breast Cancer Class It is recommended you attend the ABC class to be educated on lymphedema risk reduction. This class is free of charge and lasts for 1 hour. It is a 1-time class. You will need to download the Webex app either on your phone or computer. We will send you a link the night before or the morning of the class. You should be able to click on that link to join the class. This is not a confidential class. You don't have to turn your camera on, but other participants may be able to see your email address.  Scar massage You can begin gentle scar massage to you incision sites. Gently place one hand on the incision and move the skin (without sliding on the skin) in various directions. Do this for a few minutes and then you can gently massage either coconut oil or vitamin E cream into the scars.  Compression garment You should continue wearing your compression bra until you feel like you no longer have swelling.  Home exercise Program Continue doing the exercises you were given until you feel like you can do them without feeling any tightness at the end.   Walking Program Studies show that 30 minutes of walking per day (fast enough to elevate your heart rate) can significantly reduce the risk of a cancer recurrence. If you can't walk due to other medical reasons, we encourage you to find another activity you could do (like a stationary bike or water exercise).  Posture After breast cancer surgery, people frequently sit with rounded shoulders posture because it puts their incisions on slack and feels better. If you sit like this and scar tissue forms in that position, you can become very tight and have pain sitting or standing with good posture. Try to be aware of your posture and sit and stand up tall to heal properly.  Follow up PT: It is recommended you return every 3 months for the  first 3 years following surgery to be assessed on the SOZO machine for an L-Dex score. This helps prevent clinically significant lymphedema in 95% of patients. These follow up screens are 10 minute appointments that you are not billed for. You are scheduled for November 14, 2021 at 9:00.

## 2021-09-08 NOTE — Telephone Encounter (Signed)
Scheduled appointment per 7/13 los. Talked with [name] and she is aware of the upcoming appointments. During the phone call, we talked about patients DT visit with Dr.Gudena be changed to be in person due to the fact that the patient will already have appts the same day with [place]. [name] is aware of the changes made to that appointment (7/28 DT visit).

## 2021-09-12 ENCOUNTER — Ambulatory Visit (HOSPITAL_COMMUNITY)
Admission: RE | Admit: 2021-09-12 | Discharge: 2021-09-12 | Disposition: A | Discharge status: Home / Self Care | Payer: Medicare PPO | Source: Ambulatory Visit | Attending: Hematology and Oncology | Admitting: Hematology and Oncology

## 2021-09-12 ENCOUNTER — Encounter (HOSPITAL_COMMUNITY)
Admission: RE | Admit: 2021-09-12 | Discharge: 2021-09-12 | Disposition: A | Discharge status: Home / Self Care | Payer: Medicare PPO | Source: Ambulatory Visit | Attending: Hematology and Oncology | Admitting: Hematology and Oncology

## 2021-09-12 ENCOUNTER — Other Ambulatory Visit: Payer: Self-pay

## 2021-09-12 DIAGNOSIS — C50912 Malignant neoplasm of unspecified site of left female breast: Secondary | ICD-10-CM | POA: Diagnosis not present

## 2021-09-12 DIAGNOSIS — K7689 Other specified diseases of liver: Secondary | ICD-10-CM | POA: Diagnosis not present

## 2021-09-12 DIAGNOSIS — Z170 Estrogen receptor positive status [ER+]: Secondary | ICD-10-CM | POA: Insufficient documentation

## 2021-09-12 DIAGNOSIS — E041 Nontoxic single thyroid nodule: Secondary | ICD-10-CM | POA: Diagnosis not present

## 2021-09-12 DIAGNOSIS — C50312 Malignant neoplasm of lower-inner quadrant of left female breast: Secondary | ICD-10-CM | POA: Insufficient documentation

## 2021-09-12 DIAGNOSIS — N281 Cyst of kidney, acquired: Secondary | ICD-10-CM | POA: Diagnosis not present

## 2021-09-12 DIAGNOSIS — C50919 Malignant neoplasm of unspecified site of unspecified female breast: Secondary | ICD-10-CM | POA: Diagnosis not present

## 2021-09-12 DIAGNOSIS — M47816 Spondylosis without myelopathy or radiculopathy, lumbar region: Secondary | ICD-10-CM | POA: Diagnosis not present

## 2021-09-12 LAB — POCT I-STAT CREATININE: Creatinine, Ser: 0.7 mg/dL (ref 0.44–1.00)

## 2021-09-12 MED ORDER — TECHNETIUM TC 99M MEDRONATE IV KIT
20.0000 | PACK | Freq: Once | INTRAVENOUS | Status: AC | PRN
  Administered 2021-09-12: 20 via INTRAVENOUS

## 2021-09-12 MED ORDER — IOHEXOL 300 MG/ML  SOLN
100.0000 mL | Freq: Once | INTRAMUSCULAR | Status: AC | PRN
  Administered 2021-09-12: 100 mL via INTRAVENOUS

## 2021-09-12 MED ORDER — SODIUM CHLORIDE (PF) 0.9 % IJ SOLN
INTRAMUSCULAR | Status: AC
  Filled 2021-09-12: qty 50

## 2021-09-12 NOTE — Progress Notes (Signed)
Pharmacist Chemotherapy Monitoring - Initial Assessment    Anticipated start date: 09/14/21   The following has been reviewed per standard work regarding the patient's treatment regimen: The patient's diagnosis, treatment plan and drug doses, and organ/hematologic function Lab orders and baseline tests specific to treatment regimen  The treatment plan start date, drug sequencing, and pre-medications Prior authorization status  Patient's documented medication list, including drug-drug interaction screen and prescriptions for anti-emetics and supportive care specific to the treatment regimen The drug concentrations, fluid compatibility, administration routes, and timing of the medications to be used The patient's access for treatment and lifetime cumulative dose history, if applicable  The patient's medication allergies and previous infusion related reactions, if applicable   Changes made to treatment plan:  treatment plan date  Follow up needed:  Pending authorization for treatment  - MD wants SQ only; PA under review. F/U ECHO result.  Kennith Center, Pharm.D., CPP 09/12/2021'@9'$ :40 AM

## 2021-09-13 ENCOUNTER — Other Ambulatory Visit: Payer: Self-pay

## 2021-09-13 ENCOUNTER — Telehealth: Payer: Self-pay | Admitting: Family Medicine

## 2021-09-13 ENCOUNTER — Ambulatory Visit (HOSPITAL_COMMUNITY)
Admission: RE | Admit: 2021-09-13 | Discharge: 2021-09-13 | Disposition: A | Discharge status: Home / Self Care | Payer: Medicare PPO | Source: Ambulatory Visit | Attending: Hematology and Oncology | Admitting: Hematology and Oncology

## 2021-09-13 ENCOUNTER — Encounter: Payer: Self-pay | Admitting: *Deleted

## 2021-09-13 ENCOUNTER — Inpatient Hospital Stay: Payer: Medicare PPO

## 2021-09-13 DIAGNOSIS — Z01818 Encounter for other preprocedural examination: Secondary | ICD-10-CM | POA: Diagnosis not present

## 2021-09-13 DIAGNOSIS — C50312 Malignant neoplasm of lower-inner quadrant of left female breast: Secondary | ICD-10-CM | POA: Diagnosis not present

## 2021-09-13 DIAGNOSIS — E785 Hyperlipidemia, unspecified: Secondary | ICD-10-CM | POA: Diagnosis not present

## 2021-09-13 DIAGNOSIS — R42 Dizziness and giddiness: Secondary | ICD-10-CM | POA: Insufficient documentation

## 2021-09-13 DIAGNOSIS — Z0189 Encounter for other specified special examinations: Secondary | ICD-10-CM | POA: Diagnosis not present

## 2021-09-13 DIAGNOSIS — I119 Hypertensive heart disease without heart failure: Secondary | ICD-10-CM | POA: Diagnosis not present

## 2021-09-13 DIAGNOSIS — Z170 Estrogen receptor positive status [ER+]: Secondary | ICD-10-CM | POA: Diagnosis not present

## 2021-09-13 LAB — ECHOCARDIOGRAM COMPLETE
Area-P 1/2: 3.29 cm2
Calc EF: 66 %
MV VTI: 1.79 cm2
S' Lateral: 2.4 cm
Single Plane A2C EF: 64.5 %
Single Plane A4C EF: 65.6 %

## 2021-09-13 NOTE — Progress Notes (Signed)
  Echocardiogram 2D Echocardiogram has been performed.  Jamie [name] 09/13/2021, 10:48 AM

## 2021-09-13 NOTE — Telephone Encounter (Signed)
Pt granddaughter, [name] called [phone]). Pt is 1 month out from mastectomy. Has her first infusion of Herceptin on 7/26 and is scheduled with [place] on 7/27. [name] assumes we would be doing a steroid injection.  Any concerns about having these appts/meds so close together?

## 2021-09-14 ENCOUNTER — Encounter: Payer: Self-pay | Admitting: *Deleted

## 2021-09-14 ENCOUNTER — Inpatient Hospital Stay: Payer: Medicare PPO

## 2021-09-14 VITALS — BP 145/68 | HR 76 | Temp 97.9°F | Resp 18 | Wt 173.8 lb

## 2021-09-14 DIAGNOSIS — Z9012 Acquired absence of left breast and nipple: Secondary | ICD-10-CM | POA: Diagnosis not present

## 2021-09-14 DIAGNOSIS — Z170 Estrogen receptor positive status [ER+]: Secondary | ICD-10-CM | POA: Diagnosis not present

## 2021-09-14 DIAGNOSIS — Z79811 Long term (current) use of aromatase inhibitors: Secondary | ICD-10-CM | POA: Diagnosis not present

## 2021-09-14 DIAGNOSIS — C50312 Malignant neoplasm of lower-inner quadrant of left female breast: Secondary | ICD-10-CM | POA: Diagnosis not present

## 2021-09-14 MED ORDER — DIPHENHYDRAMINE HCL 25 MG PO CAPS
25.0000 mg | ORAL_CAPSULE | Freq: Once | ORAL | Status: AC
  Administered 2021-09-14: 25 mg via ORAL
  Filled 2021-09-14: qty 1

## 2021-09-14 MED ORDER — ACETAMINOPHEN 325 MG PO TABS
650.0000 mg | ORAL_TABLET | Freq: Once | ORAL | Status: AC
  Administered 2021-09-14: 650 mg via ORAL
  Filled 2021-09-14: qty 2

## 2021-09-14 MED ORDER — TRASTUZUMAB-HYALURONIDASE-OYSK 600-10000 MG-UNT/5ML ~~LOC~~ SOLN
600.0000 mg | Freq: Once | SUBCUTANEOUS | Status: AC
  Administered 2021-09-14: 600 mg via SUBCUTANEOUS
  Filled 2021-09-14: qty 5

## 2021-09-14 NOTE — Progress Notes (Signed)
Patient waited 30 minutes for her first herceptin hylecta treatment with no issues. VSS upon discharge.

## 2021-09-14 NOTE — Patient Instructions (Signed)
[facility]  Discharge Instructions: Thank you for choosing [facility] to provide your oncology and hematology care.   If you have a lab appointment with the [facility], please go directly to the [facility] and check in at the registration area.   Wear comfortable clothing and clothing appropriate for easy access to any Portacath or PICC line.   We strive to give you quality time with your provider. You may need to reschedule your appointment if you arrive late (15 or more minutes).  Arriving late affects you and other patients whose appointments are after yours.  Also, if you miss three or more appointments without notifying the office, you may be dismissed from the clinic at the provider's discretion.      For prescription refill requests, have your pharmacy contact our office and allow 72 hours for refills to be completed.    Today you received the following chemotherapy and/or immunotherapy agents Herceptin Hylecta     To help prevent nausea and vomiting after your treatment, we encourage you to take your nausea medication as directed.  BELOW ARE SYMPTOMS THAT SHOULD BE REPORTED IMMEDIATELY: *FEVER GREATER THAN 100.4 F (38 C) OR HIGHER *CHILLS OR SWEATING *NAUSEA AND VOMITING THAT IS NOT CONTROLLED WITH YOUR NAUSEA MEDICATION *UNUSUAL SHORTNESS OF BREATH *UNUSUAL BRUISING OR BLEEDING *URINARY PROBLEMS (pain or burning when urinating, or frequent urination) *BOWEL PROBLEMS (unusual diarrhea, constipation, pain near the anus) TENDERNESS IN MOUTH AND THROAT WITH OR WITHOUT PRESENCE OF ULCERS (sore throat, sores in mouth, or a toothache) UNUSUAL RASH, SWELLING OR PAIN  UNUSUAL VAGINAL DISCHARGE OR ITCHING   Items with * indicate a potential emergency and should be followed up as soon as possible or go to the Emergency Department if any problems should occur.  Please show the CHEMOTHERAPY ALERT CARD or IMMUNOTHERAPY ALERT CARD at  check-in to the Emergency Department and triage nurse.  Should you have questions after your visit or need to cancel or reschedule your appointment, please contact [facility]  Dept: [phone]  and follow the prompts.  Office hours are 8:00 a.m. to 4:30 p.m. Monday - Friday. Please note that voicemails left after 4:00 p.m. may not be returned until the following business day.  We are closed weekends and major holidays. You have access to a nurse at all times for urgent questions. Please call the main number to the clinic Dept: [phone] and follow the prompts.   For any non-urgent questions, you may also contact your provider using MyChart. We now offer e-Visits for anyone 64 and older to request care online for non-urgent symptoms. For details visit [email or web address].   Also download the MyChart app! Go to the app store, search "MyChart", open the app, select [facility], and log in with your MyChart username and password.  Masks are optional in the cancer centers. If you would like for your care team to wear a mask while they are taking care of you, please let them know. For doctor visits, patients may have with them one support person who is at least 85 years old. At this time, visitors are not allowed in the infusion area.   Trastuzumab; Hyaluronidase injection What is this medication? TRASTUZUMAB; HYALURONIDASE (tras TOO zoo mab / hye al ur ON i dase) is used to treat breast cancer and stomach cancer. Trastuzumab is a monoclonal antibody. Hyaluronidase is used to improve the effects of trastuzumab. This medicine may be used for  other purposes; ask your health care provider or pharmacist if you have questions. COMMON BRAND NAME(S): HERCEPTIN HYLECTA What should I tell my care team before I take this medication? They need to know if you have any of these conditions: heart disease heart failure lung or breathing disease, like asthma an unusual or  allergic reaction to trastuzumab, or other medications, foods, dyes, or preservatives pregnant or trying to get pregnant breast-feeding How should I use this medication? This medicine is for injection under the skin. It is given by a health care professional in a hospital or clinic setting. Talk to your pediatrician regarding the use of this medicine in children. This medicine is not approved for use in children. Overdosage: If you think you have taken too much of this medicine contact a poison control center or emergency room at once. NOTE: This medicine is only for you. Do not share this medicine with others. What if I miss a dose? It is important not to miss a dose. Call your doctor or health care professional if you are unable to keep an appointment. What may interact with this medication? This medicine may interact with the following medications: certain types of chemotherapy, such as daunorubicin, doxorubicin, epirubicin, and idarubicin This list may not describe all possible interactions. Give your health care provider a list of all the medicines, herbs, non-prescription drugs, or dietary supplements you use. Also tell them if you smoke, drink alcohol, or use illegal drugs. Some items may interact with your medicine. What should I watch for while using this medication? Visit your doctor for checks on your progress. Report any side effects. Continue your course of treatment even though you feel ill unless your doctor tells you to stop. Call your doctor or health care professional for advice if you get a fever, chills or sore throat, or other symptoms of a cold or flu. Do not treat yourself. Try to avoid being around people who are sick. You may experience fever, chills and shaking during your first infusion. These effects are usually mild and can be treated with other medicines. Report any side effects during the infusion to your health care professional. Fever and chills usually do not happen  with later infusions. Do not become pregnant while taking this medicine or for 7 months after stopping it. Women should inform their doctor if they wish to become pregnant or think they might be pregnant. Women of child-bearing potential will need to have a negative pregnancy test before starting this medicine. There is a potential for serious side effects to an unborn child. Talk to your health care professional or pharmacist for more information. Do not breast-feed an infant while taking this medicine or for 7 months after stopping it. What side effects may I notice from receiving this medication? Side effects that you should report to your doctor or health care professional as soon as possible: allergic reactions like skin rash, itching or hives, swelling of the face, lips, or tongue breathing problems chest pain or palpitations cough fever general ill feeling or flu-like symptoms signs of worsening heart failure like breathing problems; swelling in your legs and feet Side effects that usually do not require medical attention (report these to your doctor or health care professional if they continue or are bothersome): bone pain changes in taste diarrhea joint pain nausea/vomiting unusually weak or tired weight loss This list may not describe all possible side effects. Call your doctor for medical advice about side effects. You may report side effects  to FDA at 1-800-FDA-1088. Where should I keep my medication? This drug is given in a hospital or clinic and will not be stored at home. NOTE: This sheet is a summary. It may not cover all possible information. If you have questions about this medicine, talk to your doctor, pharmacist, or health care provider.  2023 Elsevier/Gold Standard (2017-06-25 00:00:00)

## 2021-09-14 NOTE — Progress Notes (Unsigned)
[name] [name] [name] Phone: [phone] Subjective:   Jamie [name], am serving as a scribe for Dr. Hulan Saas.  I'm seeing this patient by the request  of:  Lorrene Reid, PA-C  CC: Right knee pain  [id number]  08/02/2021 Would consider an injection again but at the moment we will hold with patient having a surgical procedure for mastectomy in 2 weeks time.  Do not want anything that would delay surgery at the moment.  We discussed that we would consider the repeat injections 4 weeks afterward.  We will get x-rays as well of the thoracic and lumbar spine to make sure there is [name] bony abnormality that could be a aidingin staging.  Updated 09/15/2021 Jamie [name] is a 87 y.o. female coming in with complaint of R knee pain. Recently had surgery in breast Patient states that her pain in knee has been increasing lately.   Less pain in her back than last visit.           Past Medical History:  Diagnosis Date   Abnormality of gait 04/18/2017   Degenerative joint disease of knee, right 03/19/2018   Injected April 18, 2018 Approved for Durolane R knee 3.4.20. Patient given another steroid injection Jun 24, 2018.  Repeat injection September 09, 2018 repeat injection February 27, 2019 repeat 6/110/20221 bilateral injections given December 01, 2019 Right knee injected April 20, 2020 monovisc May 19, 2018 Repeat steroid injection given June 16, 2020 repeat injection August 24, 2020   Essential hypertension 08/14/2016   Generalized anxiety disorder 06/27/2017   History of dizziness 06/27/2017   History of hysterectomy for benign disease 06/27/2017   History of vitamin D deficiency 08/22/2017   Leukocytes in urine 08/14/2016   Lumbar facet arthropathy 06/27/2017   Major neurocognitive disorder due to multiple etiologies 09/29/2020   Mixed hyperlipidemia 08/22/2017   Osteoarthritis of knee 09/18/2017   Pancolitis 08/14/2016    Parkinson's disease 03/13/2017   Prediabetes 08/22/2017   Right knee pain    Spinal stenosis of lumbar region    Weakness of both hips 04/18/2017   White coat syndrome without diagnosis of hypertension 08/22/2017   Past Surgical History:  Procedure Laterality Date   HYSTERECTOMY ABDOMINAL WITH SALPINGECTOMY     MASTECTOMY W/ SENTINEL NODE BIOPSY Left 08/18/2021   Procedure: LEFT MASTECTOMY WITH TARGETED LYMPH NODE DISSECTION;  Surgeon: Coralie Keens, MD;  Location: [facility];  Service: General;  Laterality: Left;   TONSILLECTOMY     Social History   Socioeconomic History   Marital status: Widowed    Spouse name: Not on file   Number of children: 3   Years of education: 12   Highest education level: High school graduate  Occupational History   Not on file  Tobacco Use   Smoking status: Never   Smokeless tobacco: Never  Vaping Use   Vaping Use: Never used  Substance and Sexual Activity   Alcohol use: [name]    Alcohol/week: 0.0 standard drinks of alcohol   Drug use: [name]   Sexual activity: Not Currently  Other Topics Concern   Not on file  Social History Narrative   Pt lives alone in her 1 story home- she has 3 sons, and a high school graduate. She is right handed, she drinks coffee, and one soda a week mostly water   Social Determinants of Health   Financial Resource Strain: Low Risk  (07/13/2021)  Overall Financial Resource Strain (CARDIA)    Difficulty of Paying Living Expenses: Not hard at all  Food Insecurity: [name] Food Insecurity (07/13/2021)   Hunger Vital Sign    Worried About Running Out of Food in the Last Year: Never true    Ran Out of Food in the Last Year: Never true  Transportation Needs: [name] Transportation Needs (07/13/2021)   PRAPARE - Hydrologist (Medical): [name]    Lack of Transportation (Non-Medical): [name]  Physical Activity: Not on file  Stress: [name] Stress Concern Present (08/16/2021)   [place]    Feeling of Stress : Not at all  Social Connections: Moderately Integrated (08/16/2021)   Social Connection and Isolation Panel [NHANES]    Frequency of Communication with Friends and Family: More than three times a week    Frequency of Social Gatherings with Friends and Family: More than three times a week    Attends Religious Services: More than 4 times per year    Active Member of [employer] or Organizations: Yes    Attends Archivist Meetings: More than 4 times per year    Marital Status: Widowed   Allergies  Allergen Reactions   Flagyl [Metronidazole] Rash    Possible cause of rash    Family History  Problem Relation Age of Onset   Breast cancer Mother 27   Melanoma Father        dx. 62s   Lung cancer Father        dx. 43s   Healthy Son      Current Outpatient Medications (Cardiovascular):    rosuvastatin (CRESTOR) 5 MG tablet, TAKE 1 TABLET BY MOUTH AT BEDTIME   Current Outpatient Medications (Analgesics):    traMADol (ULTRAM) 50 MG tablet, Take 1 tablet (50 mg total) by mouth every 6 (six) hours as needed for moderate pain.   Current Outpatient Medications (Other):    carbidopa-levodopa (SINEMET IR) 25-100 MG tablet, Take 2 tablets by mouth 3 (three) times daily.   cholecalciferol (VITAMIN D3) 25 MCG (1000 UNIT) tablet, Take 2,000 Units by mouth daily.   Coenzyme Q10 (COQ-10) 100 MG CAPS, Take 1 tablet by mouth daily.   letrozole (FEMARA) 2.5 MG tablet, Take 1 tablet (2.5 mg total) by mouth daily.   mirtazapine (REMERON) 7.5 MG tablet, Take 1 tablet (7.5 mg total) by mouth at bedtime.   Multiple Vitamin (MULTIVITAMIN WITH MINERALS) TABS tablet, Take 1 tablet by mouth daily.   Multiple Vitamins-Minerals (PRESERVISION AREDS 2 PO), Take 1 tablet by mouth in the morning and at bedtime.    NON FORMULARY, Herblax as needed   NON FORMULARY, Cholesterol Reduction   Vitamin E 100 units TABS, Take 250 mg by mouth.  Once a day   Reviewed prior external information including notes and imaging from  primary care provider As well as notes that were available from care everywhere and other healthcare systems.  Past medical history, social, surgical and family history all reviewed in electronic medical record.  [name] pertanent information unless stated regarding to the chief complaint.   Review of Systems:  [name] headache, visual changes, nausea, vomiting, diarrhea, constipation, dizziness, abdominal pain, skin rash, fevers, chills, night sweats, weight loss, swollen lymph nodes, body aches, joint swelling, chest pain, shortness of breath, mood changes. POSITIVE muscle aches  Objective  Blood pressure 112/68, pulse 81, height '5\' 8"'$  (1.727 m), weight 175 lb (79.4 kg).   General: [name]  apparent distress alert and oriented x3 mood and affect normal, dressed appropriately.  HEENT: Pupils equal, extraocular movements intact  Respiratory: Patient's speak in full sentences and does not appear short of breath  Cardiovascular: [name] lower extremity edema, non tender, [name] erythema  Antalgic gait noted.  Patient does have shuffling gait noted. Right knee does have small effusion noted.  Instability noted with valgus and varus force.  Abnormal thigh to calf ratio noted.  After informed written and verbal consent, patient was seated on exam table. Right knee was prepped with alcohol swab and utilizing anterolateral approach, patient's right knee space was injected with 4:1  marcaine 0.5%: Kenalog '40mg'$ /dL. Patient tolerated the procedure well without immediate complications.    Impression and Recommendations:     The above documentation has been reviewed and is accurate and complete Lyndal Pulley, DO

## 2021-09-15 ENCOUNTER — Ambulatory Visit: Payer: Medicare PPO | Admitting: Physical Therapy

## 2021-09-15 ENCOUNTER — Ambulatory Visit (INDEPENDENT_AMBULATORY_CARE_PROVIDER_SITE_OTHER): Payer: Medicare PPO | Admitting: Family Medicine

## 2021-09-15 ENCOUNTER — Encounter: Payer: Self-pay | Admitting: Family Medicine

## 2021-09-15 ENCOUNTER — Telehealth: Payer: Self-pay | Admitting: *Deleted

## 2021-09-15 ENCOUNTER — Encounter: Payer: Self-pay | Admitting: Physical Therapy

## 2021-09-15 DIAGNOSIS — M1711 Unilateral primary osteoarthritis, right knee: Secondary | ICD-10-CM

## 2021-09-15 DIAGNOSIS — Z483 Aftercare following surgery for neoplasm: Secondary | ICD-10-CM

## 2021-09-15 DIAGNOSIS — C50312 Malignant neoplasm of lower-inner quadrant of left female breast: Secondary | ICD-10-CM | POA: Diagnosis not present

## 2021-09-15 DIAGNOSIS — M25612 Stiffness of left shoulder, not elsewhere classified: Secondary | ICD-10-CM | POA: Diagnosis not present

## 2021-09-15 DIAGNOSIS — R293 Abnormal posture: Secondary | ICD-10-CM

## 2021-09-15 DIAGNOSIS — Z170 Estrogen receptor positive status [ER+]: Secondary | ICD-10-CM | POA: Diagnosis not present

## 2021-09-15 NOTE — Telephone Encounter (Signed)
Jamie [name] for herceptin follow up. Daughter states she had slight chills last evening, but no fever. Is doing much better this morning. Has been to PT today.

## 2021-09-15 NOTE — Progress Notes (Signed)
Radiation Oncology         [phone] ________________________________  Name: Jamie [name] MRN: [id number]  Date: 09/16/2021  DOB: [birth year]  Follow-Up Visit Note  Outpatient  CC: Lorrene Reid, Gennette Pac, MD  Diagnosis:      ICD-10-CM   1. Malignant neoplasm of lower-inner quadrant of left breast in female, estrogen receptor positive ([place])  C50.312    Z17.0        Left Breast LIQ, Invasive ductal carcinoma with high-grade DCIS, ER+ / PR+ / Her2+, Grade 3   Cancer Staging  Malignant neoplasm of lower-inner quadrant of left breast in female, estrogen receptor positive ([place]) Staging form: Breast, AJCC 8th Edition - Clinical: Stage IB (cT2, cN1, cM0, G2, ER+, PR+, HER2+) - Signed by Nicholas Lose, MD on 07/13/2021  pT3, pN2a (positive margins and 7 out of 7 axillary nodes positive)  CHIEF COMPLAINT: Here to discuss management of left breast cancer  Narrative:  The patient returns today for follow-up.     Since breast clinic consultation date of 07/13/21, she underwent genetic testing on that same date which revealed no clinically significant variants detected by BRCAplus or +RNAinsight testing.   The patient opted to proceed with left breast mastectomy and lymph node dissection on 08/18/21 under the care of Dr. Ninfa Linden. Pathology from the procedure revealed: tumor size of 5.8 cm; histology of grade 3 invasive ductal carcinoma with micropapillary features and high-grade DCIS with focal necrosis; dermal stroma and lymphatics positive for invasive carcinoma, with focal extra nodal invasion; lower inner anterior margin positive for carcinoma;  all margins negative for DCIS; nodal status of 7/7 dissected left axillary lymph nodes positive for metastatic carcinoma with focal extranodal extension present;  ER status: 100% positive and PR status 100% positive, both with strong staining intensity; Proliferation marker Ki67 at 40%; Her2 status positive; Grade 3.  Systemic  therapy, if applicable, involved (dates and therapy as follows): The patient recently began adjuvant Herceptin with AI on 09/14/21 under the care of Dr. Lindi Adie.   Pertinent imaging performed in the interval since her initial consultation date includes:  -- Whole body bone scan on 09/12/21 showed no scintigraphic evidence of osseous metastatic disease. However, focal radiotracer uptake was appreciated within the right lateral femoral condyle which warrants further evaluation.  -- CT of the chest abdomen and pelvis on 09/12/21 showed several small left axillary lymph nodes, largest measuring 11 mm, suspicious for metastatic disease, and a 2 cm right thyroid lobe nodule. Otherwise, CT showed no evidence of abdominal or pelvic metastatic disease.   Symptomatically, the patient reports: Healing well from surgery.  She started Herceptin 2 days ago.  Since then she and her daughter have noticed a rash over her bilateral chest        ALLERGIES:  is allergic to flagyl [metronidazole].  Meds: Current Outpatient Medications  Medication Sig Dispense Refill   carbidopa-levodopa (SINEMET IR) 25-100 MG tablet Take 2 tablets by mouth 3 (three) times daily. 540 tablet 2   cholecalciferol (VITAMIN D3) 25 MCG (1000 UNIT) tablet Take 2,000 Units by mouth daily.     Coenzyme Q10 (COQ-10) 100 MG CAPS Take 1 tablet by mouth daily.     letrozole (FEMARA) 2.5 MG tablet Take 1 tablet (2.5 mg total) by mouth daily. 90 tablet 3   mirtazapine (REMERON) 7.5 MG tablet Take 1 tablet (7.5 mg total) by mouth at bedtime. 90 tablet 1   Multiple Vitamin (MULTIVITAMIN WITH MINERALS) TABS tablet Take 1  tablet by mouth daily.     Multiple Vitamins-Minerals (PRESERVISION AREDS 2 PO) Take 1 tablet by mouth in the morning and at bedtime.      NON FORMULARY Herblax as needed     NON FORMULARY Cholesterol Reduction     rosuvastatin (CRESTOR) 5 MG tablet TAKE 1 TABLET BY MOUTH AT BEDTIME 90 tablet 1   traMADol (ULTRAM) 50 MG tablet Take 1  tablet (50 mg total) by mouth every 6 (six) hours as needed for moderate pain. 20 tablet 0   Vitamin E 100 units TABS Take 250 mg by mouth. Once a day     No current facility-administered medications for this encounter.    Physical Findings:  height is $RemoveB'5\' 8"'CvCAtEUH$  (1.727 m) and weight is 173 lb (78.5 kg). Her oral temperature is 98 F (36.7 C). Her blood pressure is 147/64 (abnormal) and her pulse is 78. Her respiration is 18 and oxygen saturation is 99%. .     General: Alert and oriented, in no acute distress HEENT: Head is normocephalic.  Musculoskeletal: symmetric strength and muscle tone throughout.  Good range of motion in her shoulders Neurologic: No obvious focalities. Speech is fluent.  Psychiatric: Judgment and insight are intact. Affect is appropriate. Breast exam reveals satisfactory healing status post left mastectomy. Skin: Erythematous rash over bilateral chest and upper abdomen, photo below    Lab Findings: Lab Results  Component Value Date   WBC 6.9 08/03/2021   HGB 13.4 08/03/2021   HCT 39.9 08/03/2021   MCV 92 08/03/2021   PLT 202 08/03/2021    '@LASTCHEMISTRY'$ @  Radiographic Findings: ECHOCARDIOGRAM COMPLETE  Result Date: 09/13/2021    ECHOCARDIOGRAM REPORT   Patient Name:   Jamie [name] Date of Exam: 09/13/2021 Medical Rec #:  [id number]       Height:       68.0 in Accession #:    [id number]      Weight:       175.6 lb Date of Birth:  [birth year]       BSA:          1.934 m Patient Age:    87 years        BP:           139/77 mmHg Patient Gender: F               HR:           79 bpm. Exam Location:  Outpatient Procedure: 2D Echo, 3D Echo, Cardiac Doppler, Color Doppler and Strain Analysis Indications:    Z51.11 Encounter for antineoplastic chemotheraphy  History:        Patient has no prior history of Echocardiogram examinations.                 Signs/Symptoms:Dizziness/Lightheadedness; Risk                 Factors:Hypertension and Dyslipidemia. Breast cancer.   Sonographer:    Roseanna Rainbow RDCS Referring Phys: [id number] Nicholas Lose  Sonographer Comments: Technically difficult study due to poor echo windows. Image acquisition challenging due to mastectomy. Global longitudinal strain was attempted. Patient had recent mastectomy. IMPRESSIONS  1. Left ventricular ejection fraction by 3D volume is 64 %. The left ventricle has normal function. The left ventricle has no regional wall motion abnormalities. There is mild concentric left ventricular hypertrophy. Left ventricular diastolic parameters are indeterminate. The average left ventricular global longitudinal strain is -24.1 %. The global longitudinal strain is normal.  2.  Right ventricular systolic function is normal. The right ventricular size is normal.  3. The mitral valve is normal in structure. Trivial mitral valve regurgitation. No evidence of mitral stenosis.  4. The aortic valve is normal in structure. Aortic valve regurgitation is not visualized. No aortic stenosis is present.  5. The inferior vena cava is normal in size with greater than 50% respiratory variability, suggesting right atrial pressure of 3 mmHg. FINDINGS  Left Ventricle: Left ventricular ejection fraction by 3D volume is 64 %. The left ventricle has normal function. The left ventricle has no regional wall motion abnormalities. The average left ventricular global longitudinal strain is -24.1 %. The global  longitudinal strain is normal. The left ventricular internal cavity size was normal in size. There is mild concentric left ventricular hypertrophy. Left ventricular diastolic parameters are indeterminate. Right Ventricle: The right ventricular size is normal. No increase in right ventricular wall thickness. Right ventricular systolic function is normal. Left Atrium: Left atrial size was normal in size. Right Atrium: Right atrial size was normal in size. Pericardium: There is no evidence of pericardial effusion. Mitral Valve: The mitral valve is normal in  structure. Trivial mitral valve regurgitation. No evidence of mitral valve stenosis. MV peak gradient, 7.3 mmHg. The mean mitral valve gradient is 3.0 mmHg. Tricuspid Valve: The tricuspid valve is normal in structure. Tricuspid valve regurgitation is not demonstrated. No evidence of tricuspid stenosis. Aortic Valve: The aortic valve is normal in structure. Aortic valve regurgitation is not visualized. No aortic stenosis is present. Pulmonic Valve: The pulmonic valve was not well visualized. Pulmonic valve regurgitation is trivial. No evidence of pulmonic stenosis. Aorta: The aortic root is normal in size and structure. Venous: The inferior vena cava is normal in size with greater than 50% respiratory variability, suggesting right atrial pressure of 3 mmHg. IAS/Shunts: No atrial level shunt detected by color flow Doppler.  LEFT VENTRICLE PLAX 2D LVIDd:         3.50 cm         Diastology LVIDs:         2.40 cm         LV e' medial:    5.50 cm/s LV PW:         1.10 cm         LV E/e' medial:  15.1 LV IVS:        1.20 cm         LV e' lateral:   6.87 cm/s LVOT diam:     1.90 cm         LV E/e' lateral: 12.1 LV SV:         58 LV SV Index:   30              2D LVOT Area:     2.84 cm        Longitudinal                                Strain                                2D Strain GLS  -24.1 % LV Volumes (MOD)               Avg: LV vol d, MOD    48.2 ml A2C:  3D Volume EF LV vol d, MOD    49.1 ml       LV 3D EF:    Left A4C:                                        ventricul LV vol s, MOD    17.1 ml                    ar A2C:                                        ejection LV vol s, MOD    16.9 ml                    fraction A4C:                                        by 3D LV SV MOD A2C:   31.1 ml                    volume is LV SV MOD A4C:   49.1 ml                    64 %. LV SV MOD BP:    33.2 ml                                 3D Volume EF:                                3D EF:        64 %                                 LV EDV:       87 ml                                LV ESV:       32 ml                                LV SV:        56 ml RIGHT VENTRICLE            IVC RV S prime:     9.81 cm/s  IVC diam: 1.90 cm TAPSE (M-mode): 1.7 cm LEFT ATRIUM             Index        RIGHT ATRIUM          Index LA diam:        4.00 cm 2.07 cm/m   RA Area:     8.63 cm LA Vol (A2C):   34.8 ml 18.00 ml/m  RA Volume:   13.90 ml 7.19 ml/m LA Vol (A4C):   37.6 ml 19.44 ml/m LA Biplane Vol: 36.6 ml 18.93 ml/m  AORTIC VALVE LVOT Vmax:   107.00 cm/s LVOT Vmean:  68.400 cm/s LVOT VTI:    0.206 m  AORTA Ao Root diam: 2.80 cm Ao Asc diam:  2.90 cm MITRAL VALVE MV Area (PHT): 3.29 cm     SHUNTS MV Area VTI:   1.79 cm     Systemic VTI:  0.21 m MV Peak grad:  7.3 mmHg     Systemic Diam: 1.90 cm MV Mean grad:  3.0 mmHg MV Vmax:       1.35 m/s MV Vmean:      85.3 cm/s MV Decel Time: 231 msec MV E velocity: 82.95 cm/s MV A velocity: 114.00 cm/s MV E/A ratio:  0.73 Kardie Tobb DO Electronically signed by Berniece Salines DO Signature Date/Time: 09/13/2021/2:44:34 PM    Final    NM Bone Scan Whole Body  Result Date: 09/12/2021 CLINICAL DATA:  Breast cancer, invasive, stage I/II/III, initial workup STage 3 breast cancer initial work up EXAM: NUCLEAR MEDICINE WHOLE BODY BONE SCAN TECHNIQUE: Whole body anterior and posterior images were obtained approximately 3 hours after intravenous injection of radiopharmaceutical. RADIOPHARMACEUTICALS:  20.0 mCi Technetium-65m MDP IV COMPARISON:  CT dated September 12, 2021; knee radiograph dated May 18, 2020 FINDINGS: There is asymmetric radiotracer uptake lung the lateral femoral condyle of the RIGHT knee; this is greater than would be expected for degree of degenerative changes seen on prior radiograph from 2022. Mild asymmetric radiotracer uptake along the lateral aspect of the RIGHT approximate L3-4 vertebral body which corresponds to degenerative changes on same day CT. There is radiotracer  uptake within the sternoclavicular joints, LEFT knee, and bilateral shoulders This is favored to be degenerative in etiology. Expected physiologic distribution of radiotracer. IMPRESSION: 1. Focal radiotracer uptake within the RIGHT lateral femoral condyle. This appears greater than would be expected for the degree of degenerative changes seen on prior radiograph within the lateral compartment on radiograph dated May 18, 2020. Recommend further dedicated evaluation with knee radiograph to assess for progressive degenerative or posttraumatic changes which could explain scintigraphic findings. 2. Otherwise no scintigraphic evidence of osseous metastatic disease. Electronically Signed   By: Valentino Saxon M.D.   On: 09/12/2021 16:39   CT CHEST ABDOMEN PELVIS W CONTRAST  Result Date: 09/12/2021 CLINICAL DATA:  Left breast carcinoma.  Staging. * Tracking Code: BO * EXAM: CT CHEST, ABDOMEN, AND PELVIS WITH CONTRAST TECHNIQUE: Multidetector CT imaging of the chest, abdomen and pelvis was performed following the standard protocol during bolus administration of intravenous contrast. RADIATION DOSE REDUCTION: This exam was performed according to the departmental dose-optimization program which includes automated exposure control, adjustment of the mA and/or kV according to patient size and/or use of iterative reconstruction technique. CONTRAST:  138mL OMNIPAQUE IOHEXOL 300 MG/ML  SOLN COMPARISON:  AP only CT on 08/14/2016 FINDINGS: CT CHEST FINDINGS Cardiovascular: No acute findings. Mediastinum/Lymph Nodes: 2 cm heterogeneously enhancing right thyroid lobe nodule is seen. No other masses or lymphadenopathy within the mediastinum or hilar regions. Previous left mastectomy and axillary lymph node dissection, with small postop seroma in the lateral left breast. Several small left axillary lymph nodes are seen, largest measuring 11 mm short axis on image 32/2. No other sites of lymphadenopathy identified. Lungs/Pleura:  No suspicious pulmonary nodules or masses identified. No evidence of infiltrate or pleural effusion. Musculoskeletal:  No suspicious bone lesions identified. CT ABDOMEN AND PELVIS FINDINGS Hepatobiliary: Numerous tiny sub-cm hepatic cysts show no significant change. No masses identified. Gallbladder is unremarkable. No evidence of biliary ductal  dilatation. Pancreas:  No mass or inflammatory changes. Spleen:  Within normal limits in size and appearance. Adrenals/Urinary tract: Normal adrenal glands. Numerous tiny bilateral renal cysts show no significant change (no followup imaging recommended). No masses or hydronephrosis. Stomach/Bowel: No evidence of obstruction, inflammatory process, or abnormal fluid collections. Normal appendix visualized. Vascular/Lymphatic: No pathologically enlarged lymph nodes identified. No acute vascular findings. Aortic atherosclerotic calcification incidentally noted. Reproductive: Prior hysterectomy noted. Adnexal regions are unremarkable in appearance. Other:  None. Musculoskeletal:  No suspicious bone lesions identified. IMPRESSION: Several small left axillary lymph nodes, largest measuring 11 mm, suspicious for metastatic disease. No evidence of abdominal or pelvic metastatic disease. 2 cm right thyroid lobe nodule. Recommend thyroid US. (ref: J Am Coll Radiol. 2015 Feb;12(2): 143-50). Aortic Atherosclerosis (ICD10-I70.0). Electronically Signed   By: Marlaine Hind M.D.   On: 09/12/2021 16:22    Impression/Plan: This is a very pleasant 87 year old woman with locally advanced left breast cancer, demonstrating 7 out of 7 positive axillary nodes and positive margins after mastectomy - no further surgery planned.  She has recently started Herceptin.  She may have a rash related to this.  The photograph above and her physical exam was sent to medical oncology.  She sees them today.  We discussed adjuvant radiotherapy today.  I recommend adjuvant radiation to the left chest wall and  regional nodes in order to reduce risk of local / regional recurrence.  I reviewed the logistics, benefits, risks, and potential side effects of this treatment in detail. Risks may include but not necessary be limited to acute and late injury tissue in the radiation fields such as skin irritation (change in color/pigmentation, itching, dryness, pain, peeling). She may experience fatigue. We also discussed possible risk of long term cosmetic changes or scar tissue. There is also a smaller risk for lung toxicity, cardiac toxicity, brachial plexopathy, lymphedema, musculoskeletal changes, rib fragility or induction of a second malignancy, late chronic non-healing soft tissue wound.    The patient asked good questions which I answered to her satisfaction. She is enthusiastic about proceeding with treatment. A consent form has been  signed and placed in her chart.  We will proceed with treatment planning today and start her treatment in early /mid August (August 9 )  On date of service, in total, I spent 30 minutes on this encounter. Patient was seen in person.  _____________________________________   Eppie Gibson, MD  This document serves as a record of services personally performed by Eppie Gibson, MD. It was created on her behalf by Roney Mans, a trained medical scribe. The creation of this record is based on the scribe's personal observations and the provider's statements to them. This document has been checked and approved by the attending provider.

## 2021-09-15 NOTE — Assessment & Plan Note (Signed)
Repeat injection given today and tolerated the procedure well.  We discussed with patient about the steroid and the Herceptin use.  Did not see any significant contraindications at this time.  Discussed can repeat this injection every 10 weeks if necessary.  Increase activity slowly otherwise.  Follow-up with me again in 10 weeks for this chronic problem that is worsening

## 2021-09-15 NOTE — Patient Instructions (Signed)
Injected R [place] See me in 10 weeks

## 2021-09-15 NOTE — Therapy (Addendum)
OUTPATIENT PHYSICAL THERAPY BREAST CANCER POST OP FOLLOW UP   Patient Name: Jamie [name] MRN: [id number] DOB:04/19/1934, 87 y.o., female Today's Date: 09/15/2021   PT End of Session - 09/15/21 0914     Visit Number 3    Number of Visits 3    Date for PT Re-Evaluation 10/06/21    PT Start Time 0918    PT Stop Time 0940    PT Time Calculation (min) 22 min    Activity Tolerance Patient tolerated treatment well    Behavior During Therapy [facility] for tasks assessed/performed             Past Medical History:  Diagnosis Date   Abnormality of gait 04/18/2017   Degenerative joint disease of knee, right 03/19/2018   Injected April 18, 2018 Approved for Durolane R knee 3.4.20. Patient given another steroid injection Jun 24, 2018.  Repeat injection September 09, 2018 repeat injection February 27, 2019 repeat 6/110/20221 bilateral injections given December 01, 2019 Right knee injected April 20, 2020 monovisc May 19, 2018 Repeat steroid injection given June 16, 2020 repeat injection August 24, 2020   Essential hypertension 08/14/2016   Generalized anxiety disorder 06/27/2017   History of dizziness 06/27/2017   History of hysterectomy for benign disease 06/27/2017   History of vitamin D deficiency 08/22/2017   Leukocytes in urine 08/14/2016   Lumbar facet arthropathy 06/27/2017   Major neurocognitive disorder due to multiple etiologies 09/29/2020   Mixed hyperlipidemia 08/22/2017   Osteoarthritis of knee 09/18/2017   Pancolitis 08/14/2016   Parkinson's disease 03/13/2017   Prediabetes 08/22/2017   Right knee pain    Spinal stenosis of lumbar region    Weakness of both hips 04/18/2017   White coat syndrome without diagnosis of hypertension 08/22/2017   Past Surgical History:  Procedure Laterality Date   HYSTERECTOMY ABDOMINAL WITH SALPINGECTOMY     MASTECTOMY W/ SENTINEL NODE BIOPSY Left 08/18/2021   Procedure: LEFT MASTECTOMY WITH TARGETED LYMPH NODE DISSECTION;  Surgeon: Coralie Keens, MD;  Location: [facility];  Service: General;  Laterality: Left;   TONSILLECTOMY     Patient Active Problem List   Diagnosis Date Noted   S/P left mastectomy 08/18/2021   Genetic testing 07/25/2021   Family history of breast cancer 07/14/2021   Malignant neoplasm of lower-inner quadrant of left breast in female, estrogen receptor positive ([place]) 07/11/2021   Mild episode of recurrent major depressive disorder ([place]) 04/06/2021   Age-related osteoporosis without current pathological fracture 04/06/2021   Greater trochanteric bursitis of right hip 01/21/2021   Major neurocognitive disorder due to multiple etiologies 09/29/2020   Right knee pain 05/07/2018   Degenerative joint disease of knee, right 03/19/2018   Osteoarthritis of knee 09/18/2017   Mixed hyperlipidemia 08/22/2017   Prediabetes 08/22/2017   White coat syndrome without diagnosis of hypertension 08/22/2017   History of vitamin D deficiency 08/22/2017   Lumbar facet arthropathy 06/27/2017   History of hysterectomy for benign disease 06/27/2017   Generalized anxiety disorder 06/27/2017   History of dizziness 06/27/2017   Other fatigue 06/27/2017   Overweight (BMI 25.0-29.9) 06/27/2017   Weakness of both hips 04/18/2017   Abnormality of gait 04/18/2017   Spinal stenosis of lumbar region 04/18/2017   Parkinson's disease 03/13/2017   Pancolitis 08/14/2016   Essential hypertension 08/14/2016   Leukocytes in urine 08/14/2016    REFERRING PROVIDER: Dr. Coralie Keens  REFERRING DIAG: Left breast cancer  THERAPY DIAG:  Stiffness of left shoulder, not elsewhere classified  Abnormal posture  Aftercare following surgery for neoplasm  Rationale for Evaluation and Treatment Rehabilitation  ONSET DATE: 08/18/2021  SUBJECTIVE:                                                                                                                                                                                            SUBJECTIVE STATEMENT: 09/15/21: Pt presents with her son due to memory deficits and difficulty processing secondary to Parkinson's.  Pt reports she is doing all of the exercises throughout the day.  She does not have any pain with the exercises.  09/08/21: Patient reports she underwent a left mastectomy and targeted axillary lymph node dissection (7/7 nodes positive) on 08/18/2021. Her drain was removed 09/02/2021. Her son is present due to memory deficits and difficulty processing information due to Parkinson's.  PERTINENT HISTORY:  Patient was diagnosed on 06/27/2021 with left grade 2 invasive ductal carcinoma breast cancer. She underwent a left mastectomy and targeted axillary lymph node dissection (7/7 nodes positive) on 08/18/2021. It is triple positive with a Ki67 of 40%. Patient has cognitive deficits effecting memory and was diagnosed with Parkinson's in 2020. She is triple positive with a Ki67 of 40%.  PATIENT GOALS:  Reassess how my recovery is going related to arm function, pain, and swelling.  PAIN:  Are you having pain? No  PRECAUTIONS: Recent Surgery, left UE Lymphedema risk  ACTIVITY LEVEL / LEISURE: She is walking daily in her yard for about 30 minutes.   OBJECTIVE:   PATIENT SURVEYS:  QUICK DASH:   OBSERVATIONS:  Left chest incision is healing well. Glue still present but no edema or redness present.   POSTURE:  Forward head and rounded shoulders; slightly increased thoracic kyphosis  LYMPHEDEMA ASSESSMENT:   UPPER EXTREMITY AROM/PROM:   A/PROM RIGHT   eval    Shoulder extension 43  Shoulder flexion 131  Shoulder abduction 134  Shoulder internal rotation 66  Shoulder external rotation 62                          (Blank rows = not tested)   A/PROM LEFT   eval LEFT 09/08/2021 LEFT 09/15/21  Shoulder extension 60 52 60  Shoulder flexion 122 90 140 A/ROM supine  Shoulder abduction 137 85 120 A/ROM supine 130 AA/ROM wall walk  Shoulder internal  rotation 72 NT 70  Shoulder external rotation 54 NT 60                          (Blank rows = not tested)  UPPER EXTREMITY STRENGTH: WFL     LYMPHEDEMA ASSESSMENTS:    LANDMARK RIGHT   eval RIGHT 09/08/2021  10 cm proximal to olecranon process 31 31.6  Olecranon process 25._0 cm proximal to ulnar styloid process 23.1 22.6  Just proximal to ulnar styloid process 15.9 15.9  Across hand at thumb web space 17.3 17.4  At base of 2nd digit 6 6.1  (Blank rows = not tested)   LANDMARK LEFT   eval LEFT 09/08/2021  10 cm proximal to olecranon process 30.2 31.6  Olecranon process 24.3 25.6  10 cm proximal to ulnar styloid process 22 22.7  Just proximal to ulnar styloid process 16.1 16.2  Across hand at thumb web space 17 17.3  At base of 2nd digit 5.8 6  (Blank rows = not tested)         Surgery type/Date: Left mastectomy and targeted node dissection 08/18/2021 Number of lymph nodes removed: 7 Current/past treatment (chemo, radiation, hormone therapy): none Other symptoms:  Heaviness/tightness No Pain No Pitting edema No Infections No Decreased scar mobility Yes Stemmer sign No  TODAY'S TREATMENT:  09/15/21: Review of HEP with VC as needed for set up and technique secondary to memory challenges: Each exercise 5 reps, hold 3 sec, repeated 2x through as circuit Seated scapular retraction (good form no pain) Seated AA/ROM Lt shoulder flexion (gets to approx 100 deg) Seated hands behind head ER (demos symmetry with this) Standing wall walk shoulder flexion and abduction  Supine Lt shoulder A/ROM measurements Discussed ROM findings and noted she is well within range for positioning for radiation which begins tomorrow  PATIENT EDUCATION:  Education details: Reviewed HEP with pt and son Person educated: Patient and Child(ren) Education method: Explanation, Demonstration, Tactile cues, Verbal cues, and Handouts Education comprehension: verbalized understanding,  returned demonstration, verbal cues required, tactile cues required, and needs further education   HOME EXERCISE PROGRAM:  Reviewed previously given post op HEP  ASSESSMENT:  CLINICAL IMPRESSION: Pt presents with her son secondary to memory and processing challenges secondary to Parkinson's.  She has been compliant with her HEP which is evident given much improved ROM measurements of Lt shoulder since last week.  She is able to achieve 130 deg AA/ROM with wall walk for both flexion and abd.  Supine shoulder flexion A/ROM is 140 deg without pain.  ER measures 60 deg in supine, and hands behind head seated ER is symmetrical to Rt shoulder.  She is painfree in shoulder with all therex and ROM.  She measures appropriately for comfortable positioning for radiation which begins tomorrow.    Pt will benefit from skilled therapeutic intervention to improve on the following deficits: Decreased knowledge of precautions, impaired UE functional use, pain, decreased ROM, postural dysfunction.   PT treatment/interventions: ADL/Self care home management, Therapeutic exercises, Therapeutic activity, Patient/Family education, Self Care, Manual lymph drainage, scar mobilization, Manual therapy, and Re-evaluation     GOALS: Goals reviewed with patient? Yes and her son  LONG TERM GOALS:  (STG=LTG)  GOALS Name Target Date  Goal status  1 Pt will demonstrate she has regained full shoulder ROM and function post operatively compared to baselines.  Baseline: 09/07/2021 MET  2 Patient will demonstrate she has increased active left shoulder flexion and abduction to >/= 120 degrees for improved ability to obtain radiation positioning. 10/13/2021 MET     PLAN: PT FREQUENCY/DURATION: follow up as needed following next phase of treatment  PLAN FOR NEXT SESSION: Pt ind with HEP and  has achieved ROM needed to begin radiation.  D/C PT to HEP.   [facility]  [address]  [place] [address]  [phone]  After Breast Cancer Class It is recommended you attend the ABC class to be educated on lymphedema risk reduction. This class is free of charge and lasts for 1 hour. It is a 1-time class. You will need to download the Webex app either on your phone or computer. We will send you a link the night before or the morning of the class. You should be able to click on that link to join the class. This is not a confidential class. You don't have to turn your camera on, but other participants may be able to see your email address.  Scar massage You can begin gentle scar massage to you incision sites. Gently place one hand on the incision and move the skin (without sliding on the skin) in various directions. Do this for a few minutes and then you can gently massage either coconut oil or vitamin E cream into the scars.  Compression garment You should continue wearing your compression bra until you feel like you no longer have swelling.  Home exercise Program Continue doing the exercises you were given until you feel like you can do them without feeling any tightness at the end.   Walking Program Studies show that 30 minutes of walking per day (fast enough to elevate your heart rate) can significantly reduce the risk of a cancer recurrence. If you can't walk due to other medical reasons, we encourage you to find another activity you could do (like a stationary bike or water exercise).  Posture After breast cancer surgery, people frequently sit with rounded shoulders posture because it puts their incisions on slack and feels better. If you sit like this and scar tissue forms in that position, you can become very tight and have pain sitting or standing with good posture. Try to be aware of your posture and sit and stand up tall to heal properly.  Follow up PT: It is recommended you return every 3 months for the first 3 years following surgery to be assessed on the SOZO machine for an  L-Dex score. This helps prevent clinically significant lymphedema in 95% of patients. These follow up screens are 10 minute appointments that you are not billed for. You are scheduled for November 14, 2021 at 9:00.  Jadan Hinojos, PT 09/15/21 12:27 PM  PHYSICAL THERAPY DISCHARGE SUMMARY  Visits from Start of Care: 3  Current functional level related to goals / functional outcomes: See above.  Pt achieved ROM needed for shoulder positioning for radiation.   Remaining deficits: See above   Education / Equipment: HEP   Patient agrees to discharge. Patient goals were met. Patient is being discharged due to meeting the stated rehab goals.  Joleigh Mineau, PT 10/20/21 1:24 PM

## 2021-09-15 NOTE — Telephone Encounter (Signed)
-----   Message from Tildon Husky, RN sent at 09/14/2021 12:05 PM EDT ----- Regarding: first time treatment call back gudena- herceptin hylecta Patient received herceptin hylecta today for the first time. She is seen by Dr Lindi Adie. Treatment went well with no issues

## 2021-09-15 NOTE — Progress Notes (Signed)
Patient Care Team: Mayer Masker, PA-C as PCP - General (Physician Assistant) Othella Boyer, MD as Consulting Physician (Cardiology) Bufford Buttner, MD as Referring Physician (Dermatology) Rehabilitation, [place] Tat, Octaviano Batty, DO as Consulting Physician (Neurology) Pershing Proud, RN as Oncology Nurse Navigator Rogelia Boga, Eileen Stanford, RN as Oncology Nurse Navigator Abigail Miyamoto, MD as Consulting Physician (General Surgery) Serena Croissant, MD as Consulting Physician (Hematology and Oncology) Lonie Peak, MD as Attending Physician (Radiation Oncology) Mayer Masker, PA-C as Physician Assistant (Physician Assistant)  DIAGNOSIS:  Encounter Diagnosis  Name Primary?   Malignant neoplasm of lower-inner quadrant of left breast in female, estrogen receptor positive (HCC)     SUMMARY OF ONCOLOGIC HISTORY: Oncology History  Malignant neoplasm of lower-inner quadrant of left breast in female, estrogen receptor positive ([facility])  07/11/2021 Initial Diagnosis   Skin changes in the left breast, screening mammogram revealed thickening of the lower medial quadrant along with enlarged lymph node in the axilla (2 additional lymph nodes) by ultrasound breast tumor measured 3.9 cm, biopsy: Grade 2 IDC with lymphovascular invasion ER 100%, PR 100%, HER2 positive Ki-67 40%, lymph node biopsy positive   07/13/2021 Cancer Staging   Staging form: Breast, AJCC 8th Edition - Clinical: Stage IB (cT2, cN1, cM0, G2, ER+, PR+, HER2+) - Signed by Serena Croissant, MD on 07/13/2021 Stage prefix: Initial diagnosis Histologic grading system: 3 grade system    Genetic Testing   [facility] CustomNext Panel was Negative. Report date is 07/24/2021.  The CustomNext panel offered by [employer] includes sequencing, rearrangement, and RNA analysis for the following 41 genes: APC, ATM, BAP1, BARD1, BMPR1A, BRCA1, BRCA2, BRIP1, CDH1, CDK4, CDKN2A, CHEK2, DICER1, MLH1, MSH2, MSH6, MUTYH, NBN, NF1, NTHL1, PALB2, PMS2,  POT1, PTEN, RAD51C, RAD51D, RB1, RECQL, SMAD4, SMARCA4, STK11 and TP53 (sequencing and deletion/duplication); AXIN2, EGFR, HOXB13, MITF, MSH3, POLD1 and POLE (sequencing only); EPCAM and GREM1 (deletion/duplication only).    09/14/2021 -  Chemotherapy   Patient is on Treatment Plan : BREAST Trastuzumab q21d       CHIEF COMPLIANT: discuss the results of the scans   INTERVAL HISTORY: Jamie [name] is a 87 y.o. female is here because of recent diagnosis of left breast. She presents to the clinic today for a follow-up. She states that the scans went well. She got a slight rash after the treatment she noticed it last night and then some this morning. She denies itching. Denies pain just when she lifts her arm she feels a little. She has been stretching her arm because it feels quite tight.   ALLERGIES:  is allergic to flagyl [metronidazole].  MEDICATIONS:  Current Outpatient Medications  Medication Sig Dispense Refill   carbidopa-levodopa (SINEMET IR) 25-100 MG tablet Take 2 tablets by mouth 3 (three) times daily. 540 tablet 2   cholecalciferol (VITAMIN D3) 25 MCG (1000 UNIT) tablet Take 2,000 Units by mouth daily.     Clobetasol Propionate (CLOBETASOL 17 PROPIONATE) 0.5 % POWD 1 Application by Does not apply route 2 (two) times daily. 25 g 1   Coenzyme Q10 (COQ-10) 100 MG CAPS Take 1 tablet by mouth daily.     letrozole (FEMARA) 2.5 MG tablet Take 1 tablet (2.5 mg total) by mouth daily. 90 tablet 3   mirtazapine (REMERON) 7.5 MG tablet Take 1 tablet (7.5 mg total) by mouth at bedtime. 90 tablet 1   Multiple Vitamin (MULTIVITAMIN WITH MINERALS) TABS tablet Take 1 tablet by mouth daily.     Multiple Vitamins-Minerals (PRESERVISION AREDS 2 PO)  Take 1 tablet by mouth in the morning and at bedtime.      NON FORMULARY Herblax as needed     NON FORMULARY Cholesterol Reduction     rosuvastatin (CRESTOR) 5 MG tablet TAKE 1 TABLET BY MOUTH AT BEDTIME 90 tablet 1   traMADol (ULTRAM) 50 MG tablet Take 1  tablet (50 mg total) by mouth every 6 (six) hours as needed for moderate pain. 20 tablet 0   Vitamin E 100 units TABS Take 250 mg by mouth. Once a day     No current facility-administered medications for this visit.    PHYSICAL EXAMINATION: ECOG PERFORMANCE STATUS: 2 - Symptomatic, <50% confined to bed  Vitals:   09/16/21 1027  BP: (!) 157/81  Pulse: 87  Resp: 18  Temp: 97.7 F (36.5 C)  SpO2: 100%   Filed Weights   09/16/21 1027  Weight: 173 lb 4.8 oz (78.6 kg)    BREAST: Fine erythematous rash mid abdomen as well as a slight rash on the chest wall as well (exam performed in the presence of a chaperone)  LABORATORY DATA:  I have reviewed the data as listed    Latest Ref Rng & Units 09/12/2021    2:21 PM 08/03/2021    8:55 AM 07/13/2021    8:31 AM  CMP  Glucose 70 - 99 mg/dL  585  739   BUN 8 - 27 mg/dL  22  21   Creatinine 3.74 - 1.00 mg/dL 6.02  3.06  7.80   Sodium 134 - 144 mmol/L  141  140   Potassium 3.5 - 5.2 mmol/L  4.4  4.0   Chloride 96 - 106 mmol/L  106  107   CO2 20 - 29 mmol/L  22  28   Calcium 8.7 - 10.3 mg/dL  92.7  70.0   Total Protein 6.0 - 8.5 g/dL  6.6  7.3   Total Bilirubin 0.0 - 1.2 mg/dL  0.5  0.6   Alkaline Phos 44 - 121 IU/L  83  81   AST 0 - 40 IU/L  21  18   ALT 0 - 32 IU/L  8  18     Lab Results  Component Value Date   WBC 6.9 08/03/2021   HGB 13.4 08/03/2021   HCT 39.9 08/03/2021   MCV 92 08/03/2021   PLT 202 08/03/2021   NEUTROABS 4.5 07/13/2021    ASSESSMENT & PLAN:  Malignant neoplasm of lower-inner quadrant of left breast in female, estrogen receptor positive ([facility]) 07/09/2021:Skin changes in the left breast, screening mammogram revealed thickening of the lower medial quadrant along with enlarged lymph node in the axilla (2 additional lymph nodes) by ultrasound breast tumor measured 3.9 cm, biopsy: Grade 2 IDC with lymphovascular invasion ER 100%, PR 100%, HER2 positive Ki-67 40%, lymph node biopsy positive   08/18/21: Left  Mastectomy: Grade 3 IDC with micropapillary features with DCIS 5.8 cm 7/7 LN Positives, involves dermal stroma and lymphatics with focal Extra nodal invasion   09/12/2021: Bone scan focal radiotracer uptake right lateral femoral condyle (knee x-ray recommended) 09/12/2021: CT CAP: Several small left axillary lymph nodes.  No evidence of distant metastatic disease.  2 cm right thyroid nodule  Radiology counseling: I discussed with the patient that there does not appear to be any distant metastatic disease but there is very likely locally advanced disease in the left axilla.  Treatment Plan: 1. adjuvant Herceptin with aromatase inhibitors started 09/14/2021 2.  Adjuvant radiation  Adverse effects: Fine macular rash: I recommended discontinuation of letrozole.  We will reassess her in 2 weeks.  It is unclear if it is related to letrozole or Herceptin.  I sent a prescription for clobetasol propionate ointment Denies any other adverse effects.  She met with Dr. Isidore Moos and is getting ready for 6 weeks of radiation. Return to clinic every 3 weeks for Herceptin and every 6 weeks for follow-up with me.    No orders of the defined types were placed in this encounter.  The patient has a good understanding of the overall plan. she agrees with it. she will call with any problems that may develop before the next visit here. Total time spent: 30 mins including face to face time and time spent for planning, charting and co-ordination of care   Harriette Ohara, MD 09/16/21    I Gardiner Coins am scribing for Dr. Lindi Adie  I have reviewed the above documentation for accuracy and completeness, and I agree with the above.

## 2021-09-15 NOTE — Progress Notes (Signed)
Location of Breast Cancer:  Malignant neoplasm of lower-inner quadrant of left breast in female, estrogen receptor positive  Histology per Pathology Report:  08/18/2021 FINAL MICROSCOPIC DIAGNOSIS:  A. BREAST, LEFT, MASTECTOMY:  - Invasive ductal carcinoma with micropapillary features, 5.8 cm, grade 3  - Ductal carcinoma in situ, high-grade with focal necrosis  - Carcinoma involves inked anterior margin in the lower inner quadrant  - Carcinoma involves dermal stroma and lymphatics of overlying skin but epidermis is not involved  - Nipple is not involved  - Biopsy site changes  - See oncology table  B. LYMPH NODES, LEFT AXILLARY, WITH TARGETED LYMPH NODE WITH SEED,  DISSECTION:  - Metastatic carcinoma to seven of seven lymph nodes (7/7) with focal extranodal extension  - Largest focus of metastatic carcinoma measures 2.0 cm   Receptor Status: ER(100%), PR (100%), Her2-neu (Positive), Ki-67(40%)  Did patient present with symptoms (if so, please note symptoms) or was this found on screening mammography?: Patient presented with a palpable lower inner left breast lump and intermittent pain across the upper quadrants of the left breast.  Diagnostic bilateral mammogram on 06/27/21 revealed an indeterminate enlarged lymph node in the left axilla and a partially visualized density in the lower inner left breast  Past/Anticipated interventions by surgeon, if any:  09/02/2021 --Dr. Coralie Keens (office visit) Physical Exam  She appears well on exam The left mastectomy incision is healing well. The drain is serosanguineous.  I removed the drain without incident. We again discussed the final pathology showing that the malignancy was greater than 5 cm and 7 lymph nodes were positive for malignancy  Assessment and Plan We discussed the possibility of seroma occurrences after removal of the drain. I explained the signs symptoms of this.  I will see her back in 3 weeks for recheck. From my  standpoint, she may resume all normal activity Return in about 3 weeks (around 09/23/2021).  08/18/2021 --Dr. Coralie Keens LEFT MASTECTOMY WITH TARGETED RADIOACTIVE SEED GUIDED DEEP AXILLARY LYMPH NODE DISSECTION  Past/Anticipated interventions by medical oncology, if any:  Under care of Dr. Nicholas Lose 09/01/2021 --Treatment Plan: Adjuvant Herceptin with aromatase inhibitors Adjuvant radiation Scans --We will discuss the results of the scans on follow-up  Lymphedema issues, if any:  Patient denies. Continues to work with PT to help with range of motion limitations to her left shoulder/arm  Pain issues, if any:  Reports discomfort only with movement   SAFETY ISSUES: Prior radiation? No Pacemaker/ICD? No Possible current pregnancy? No--hysterectomy Is the patient on methotrexate? No  Current Complaints / other details:  Nothing else of note

## 2021-09-16 ENCOUNTER — Telehealth: Payer: Self-pay | Admitting: *Deleted

## 2021-09-16 ENCOUNTER — Ambulatory Visit
Admission: RE | Admit: 2021-09-16 | Discharge: 2021-09-16 | Disposition: A | Discharge status: Home / Self Care | Payer: Medicare PPO | Source: Ambulatory Visit | Attending: Radiation Oncology | Admitting: Radiation Oncology

## 2021-09-16 ENCOUNTER — Other Ambulatory Visit: Payer: Self-pay

## 2021-09-16 ENCOUNTER — Inpatient Hospital Stay: Payer: Medicare PPO | Admitting: Hematology and Oncology

## 2021-09-16 ENCOUNTER — Encounter: Payer: Self-pay | Admitting: Radiation Oncology

## 2021-09-16 VITALS — BP 147/64 | HR 78 | Temp 98.0°F | Resp 18 | Ht 68.0 in | Wt 173.0 lb

## 2021-09-16 DIAGNOSIS — Z170 Estrogen receptor positive status [ER+]: Secondary | ICD-10-CM | POA: Insufficient documentation

## 2021-09-16 DIAGNOSIS — C50312 Malignant neoplasm of lower-inner quadrant of left female breast: Secondary | ICD-10-CM | POA: Diagnosis not present

## 2021-09-16 DIAGNOSIS — E785 Hyperlipidemia, unspecified: Secondary | ICD-10-CM | POA: Insufficient documentation

## 2021-09-16 DIAGNOSIS — C773 Secondary and unspecified malignant neoplasm of axilla and upper limb lymph nodes: Secondary | ICD-10-CM | POA: Insufficient documentation

## 2021-09-16 DIAGNOSIS — Z9012 Acquired absence of left breast and nipple: Secondary | ICD-10-CM | POA: Diagnosis not present

## 2021-09-16 DIAGNOSIS — Z79811 Long term (current) use of aromatase inhibitors: Secondary | ICD-10-CM | POA: Insufficient documentation

## 2021-09-16 DIAGNOSIS — Z79899 Other long term (current) drug therapy: Secondary | ICD-10-CM | POA: Insufficient documentation

## 2021-09-16 DIAGNOSIS — I10 Essential (primary) hypertension: Secondary | ICD-10-CM | POA: Diagnosis not present

## 2021-09-16 MED ORDER — CLOBETASOL 17 PROPIONATE 0.5 % POWD
1.0000 | Freq: Two times a day (BID) | 1 refills | Status: DC
Start: 2021-09-16 — End: 2021-09-16

## 2021-09-16 NOTE — Telephone Encounter (Signed)
Received call from pharmacy stating Clobetasol powder form is not in stock and they are unable to obtain prescription.  MD notified and verbal orders received to discontinue Clobetasol and instruct pt to use OTC cortisone cream daily on rash.  Pt educated and verbalized understanding.

## 2021-09-16 NOTE — Assessment & Plan Note (Signed)
07/09/2021:Skin changes in the left breast, screening mammogram revealed thickening of the lower medial quadrant along with enlarged lymph node in the axilla (2 additional lymph nodes) by ultrasound breast tumor measured 3.9 cm, biopsy: Grade 2 IDC with lymphovascular invasion ER 100%, PR 100%, HER2 positive Ki-67 40%, lymph node biopsy positive  08/18/21: Left Mastectomy: Grade 3 IDC with micropapillary features with DCIS 5.8 cm 7/7 LN Positives, involves dermal stroma and lymphatics with focal Extra nodal invasion  09/12/2021: Bone scan focal radiotracer uptake right lateral femoral condyle (knee x-ray recommended) 09/12/2021: CT CAP: Several small left axillary lymph nodes.  No evidence of distant metastatic disease.  2 cm right thyroid nodule  Radiology counseling: I discussed with the patient that there does not appear to be any distant metastatic disease but there is very likely locally advanced disease in the left axilla.  Treatment Plan: 1. adjuvant Herceptin with aromatase inhibitors 2.Adjuvant radiation  Return to clinic every 3 weeks for Herceptin and every 6 weeks for follow-up with me.

## 2021-09-19 NOTE — Progress Notes (Signed)
..  Patient is receiving Replacement Medication. Medication: Herceptin Hylecta Manufacture: [employer] Approval Dates: Approved from 09/16/2021 until indefinitely. ID: [id number] Reason: Insurance Denial First DOS: 09/14/2021

## 2021-09-23 ENCOUNTER — Encounter: Payer: Self-pay | Admitting: *Deleted

## 2021-09-23 DIAGNOSIS — Z170 Estrogen receptor positive status [ER+]: Secondary | ICD-10-CM | POA: Diagnosis not present

## 2021-09-23 DIAGNOSIS — C50312 Malignant neoplasm of lower-inner quadrant of left female breast: Secondary | ICD-10-CM | POA: Insufficient documentation

## 2021-09-23 NOTE — Progress Notes (Signed)
Patient Care Team: Lorrene Reid, PA-C as PCP - General (Physician Assistant) [name], MD as Consulting Physician (Cardiology) Sydnee Levans, MD as Referring Physician (Dermatology) Rehabilitation, [place] Tat, Eustace Quail, DO as Consulting Physician (Neurology) Mauro Kaufmann, RN as Oncology Nurse Navigator Carlynn Spry, Charlott Holler, RN as Oncology Nurse Navigator Coralie Keens, MD as Consulting Physician (General Surgery) Nicholas Lose, MD as Consulting Physician (Hematology and Oncology) Eppie Gibson, MD as Attending Physician (Radiation Oncology) Lorrene Reid, PA-C as Physician Assistant (Physician Assistant)  DIAGNOSIS:  Encounter Diagnosis  Name Primary?   Malignant neoplasm of lower-inner quadrant of left breast in female, estrogen receptor positive ([place])     SUMMARY OF ONCOLOGIC HISTORY: Oncology History  Malignant neoplasm of lower-inner quadrant of left breast in female, estrogen receptor positive ([name])  07/11/2021 Initial Diagnosis   Skin changes in the left breast, screening mammogram revealed thickening of the lower medial quadrant along with enlarged lymph node in the axilla (2 additional lymph nodes) by ultrasound breast tumor measured 3.9 cm, biopsy: Grade 2 IDC with lymphovascular invasion ER 100%, PR 100%, HER2 positive Ki-67 40%, lymph node biopsy positive   07/13/2021 Cancer Staging   Staging form: Breast, AJCC 8th Edition - Clinical: Stage IB (cT2, cN1, cM0, G2, ER+, PR+, HER2+) - Signed by Nicholas Lose, MD on 07/13/2021 Stage prefix: Initial diagnosis Histologic grading system: 3 grade system    Genetic Testing   [employer] CustomNext Panel was Negative. Report date is 07/24/2021.  The CustomNext panel offered by [employer] includes sequencing, rearrangement, and RNA analysis for the following 41 genes: APC, ATM, BAP1, BARD1, BMPR1A, BRCA1, BRCA2, BRIP1, CDH1, CDK4, CDKN2A, CHEK2, DICER1, MLH1, MSH2, MSH6, MUTYH, NBN, NF1, NTHL1, PALB2, PMS2,  POT1, PTEN, RAD51C, RAD51D, RB1, RECQL, SMAD4, SMARCA4, STK11 and TP53 (sequencing and deletion/duplication); AXIN2, EGFR, HOXB13, MITF, MSH3, POLD1 and POLE (sequencing only); EPCAM and GREM1 (deletion/duplication only).    09/14/2021 -  Chemotherapy   Patient is on Treatment Plan : BREAST Trastuzumab q21d       CHIEF COMPLIANT: Follow-up on rash and breast cancer on herceptin  INTERVAL HISTORY: [name] is a  87 y.o. female is here because of recent diagnosis of left breast. She presents to the clinic today for a follow-up. She states that the rash is doing better. She had some flu like symptoms from the herceptin that evening.   ALLERGIES:  is allergic to flagyl [metronidazole].  MEDICATIONS:  Current Outpatient Medications  Medication Sig Dispense Refill   carbidopa-levodopa (SINEMET IR) 25-100 MG tablet Take 2 tablets by mouth 3 (three) times daily. 540 tablet 2   cholecalciferol (VITAMIN D3) 25 MCG (1000 UNIT) tablet Take 2,000 Units by mouth daily.     Coenzyme Q10 (COQ-10) 100 MG CAPS Take 1 tablet by mouth daily.     letrozole (FEMARA) 2.5 MG tablet Take 1 tablet (2.5 mg total) by mouth daily. 90 tablet 3   mirtazapine (REMERON) 7.5 MG tablet Take 1 tablet (7.5 mg total) by mouth at bedtime. 90 tablet 1   Multiple Vitamin (MULTIVITAMIN WITH MINERALS) TABS tablet Take 1 tablet by mouth daily.     Multiple Vitamins-Minerals (PRESERVISION AREDS 2 PO) Take 1 tablet by mouth in the morning and at bedtime.      NON FORMULARY Herblax as needed     NON FORMULARY Cholesterol Reduction     rosuvastatin (CRESTOR) 5 MG tablet TAKE 1 TABLET BY MOUTH AT BEDTIME 90 tablet 1   traMADol (ULTRAM) 50 MG  tablet Take 1 tablet (50 mg total) by mouth every 6 (six) hours as needed for moderate pain. 20 tablet 0   Vitamin E 100 units TABS Take 250 mg by mouth. Once a day     No current facility-administered medications for this visit.    PHYSICAL EXAMINATION: ECOG PERFORMANCE STATUS: 1 -  Symptomatic but completely ambulatory  Vitals:   10/04/21 1047  BP: (!) 154/67  Pulse: 81  Resp: 18  Temp: 97.8 F (36.6 C)  SpO2: 100%   Filed Weights   10/04/21 1047  Weight: 171 lb 9.6 oz (77.8 kg)      LABORATORY DATA:  I have reviewed the data as listed    Latest Ref Rng & Units 09/12/2021    2:21 PM 08/03/2021    8:55 AM 07/13/2021    8:31 AM  CMP  Glucose 70 - 99 mg/dL  113  126   BUN 8 - 27 mg/dL  22  21   Creatinine 0.44 - 1.00 mg/dL 0.70  0.75  0.86   Sodium 134 - 144 mmol/L  141  140   Potassium 3.5 - 5.2 mmol/L  4.4  4.0   Chloride 96 - 106 mmol/L  106  107   CO2 20 - 29 mmol/L  22  28   Calcium 8.7 - 10.3 mg/dL  10.1  11.0   Total Protein 6.0 - 8.5 g/dL  6.6  7.3   Total Bilirubin 0.0 - 1.2 mg/dL  0.5  0.6   Alkaline Phos 44 - 121 IU/L  83  81   AST 0 - 40 IU/L  21  18   ALT 0 - 32 IU/L  8  18     Lab Results  Component Value Date   WBC 6.9 08/03/2021   HGB 13.4 08/03/2021   HCT 39.9 08/03/2021   MCV 92 08/03/2021   PLT 202 08/03/2021   NEUTROABS 4.5 07/13/2021    ASSESSMENT & PLAN:  Malignant neoplasm of lower-inner quadrant of left breast in female, estrogen receptor positive ([name]) 07/09/2021:Skin changes in the left breast, screening mammogram revealed thickening of the lower medial quadrant along with enlarged lymph node in the axilla (2 additional lymph nodes) by ultrasound breast tumor measured 3.9 cm, biopsy: Grade 2 IDC with lymphovascular invasion ER 100%, PR 100%, HER2 positive Ki-67 40%, lymph node biopsy positive   08/18/21: Left Mastectomy: Grade 3 IDC with micropapillary features with DCIS 5.8 cm 7/7 LN Positives, involves dermal stroma and lymphatics with focal Extra nodal invasion   09/12/2021: Bone scan focal radiotracer uptake right lateral femoral condyle (knee x-ray recommended) 09/12/2021: CT CAP: Several small left axillary lymph nodes.  No evidence of distant metastatic disease.  2 cm right thyroid  nodule ------------------------------------------------------------------------------------------------------------------------  Treatment Plan: 1. adjuvant Herceptin with aromatase inhibitors started 09/14/2021 2.  Adjuvant radiation to complete 11/09/2021   Adverse effects: Fine macular rash: It is slightly better with topical cortisone cream.  I instructed her to resume letrozole and we will continue with the Herceptin and monitor the rash. Denies any other adverse effects.   Return to clinic every 3 weeks for Herceptin and every 6 weeks for follow-up with me.    No orders of the defined types were placed in this encounter.  The patient has a good understanding of the overall plan. she agrees with it. she will call with any problems that may develop before the next visit here. Total time spent: 30 mins including face to face time  and time spent for planning, charting and co-ordination of care   Harriette Ohara, MD 10/04/21    I Gardiner Coins am scribing for Dr. Lindi Adie  I have reviewed the above documentation for accuracy and completeness, and I agree with the above.

## 2021-09-27 ENCOUNTER — Other Ambulatory Visit: Payer: Medicare PPO

## 2021-09-27 DIAGNOSIS — Z0000 Encounter for general adult medical examination without abnormal findings: Secondary | ICD-10-CM | POA: Diagnosis not present

## 2021-09-27 DIAGNOSIS — R7989 Other specified abnormal findings of blood chemistry: Secondary | ICD-10-CM | POA: Diagnosis not present

## 2021-09-27 DIAGNOSIS — E039 Hypothyroidism, unspecified: Secondary | ICD-10-CM | POA: Diagnosis not present

## 2021-09-27 DIAGNOSIS — C50912 Malignant neoplasm of unspecified site of left female breast: Secondary | ICD-10-CM | POA: Diagnosis not present

## 2021-09-28 ENCOUNTER — Ambulatory Visit
Admission: RE | Admit: 2021-09-28 | Discharge: 2021-09-28 | Disposition: A | Discharge status: Home / Self Care | Payer: Medicare PPO | Source: Ambulatory Visit | Attending: Radiation Oncology | Admitting: Radiation Oncology

## 2021-09-28 ENCOUNTER — Other Ambulatory Visit: Payer: Self-pay

## 2021-09-28 DIAGNOSIS — Z510 Encounter for antineoplastic radiation therapy: Secondary | ICD-10-CM | POA: Diagnosis not present

## 2021-09-28 DIAGNOSIS — C50312 Malignant neoplasm of lower-inner quadrant of left female breast: Secondary | ICD-10-CM | POA: Diagnosis not present

## 2021-09-28 DIAGNOSIS — Z170 Estrogen receptor positive status [ER+]: Secondary | ICD-10-CM | POA: Diagnosis not present

## 2021-09-28 LAB — RAD ONC ARIA SESSION SUMMARY

## 2021-09-28 LAB — TSH+FREE T4
Free T4: 1.07 ng/dL (ref 0.82–1.77)
TSH: 5.02 u[IU]/mL — ABNORMAL HIGH (ref 0.450–4.500)

## 2021-09-29 ENCOUNTER — Other Ambulatory Visit: Payer: Self-pay

## 2021-09-29 ENCOUNTER — Ambulatory Visit
Admission: RE | Admit: 2021-09-29 | Discharge: 2021-09-29 | Disposition: A | Discharge status: Home / Self Care | Payer: Medicare PPO | Source: Ambulatory Visit | Attending: Radiation Oncology | Admitting: Radiation Oncology

## 2021-09-29 DIAGNOSIS — Z510 Encounter for antineoplastic radiation therapy: Secondary | ICD-10-CM | POA: Diagnosis not present

## 2021-09-29 DIAGNOSIS — C50312 Malignant neoplasm of lower-inner quadrant of left female breast: Secondary | ICD-10-CM | POA: Diagnosis not present

## 2021-09-29 DIAGNOSIS — Z170 Estrogen receptor positive status [ER+]: Secondary | ICD-10-CM | POA: Diagnosis not present

## 2021-09-29 LAB — RAD ONC ARIA SESSION SUMMARY
Course Elapsed Days: 1
Plan Fractions Treated to Date: 1
Plan Fractions Treated to Date: 2
Plan Prescribed Dose Per Fraction: 2 Gy
Plan Prescribed Dose Per Fraction: 2 Gy
Plan Total Fractions Prescribed: 12
Plan Total Fractions Prescribed: 25
Plan Total Prescribed Dose: 24 Gy
Plan Total Prescribed Dose: 50 Gy
Reference Point Dosage Given to Date: 4 Gy
Reference Point Dosage Given to Date: 4 Gy
Reference Point Session Dosage Given: 2 Gy
Reference Point Session Dosage Given: 2 Gy
Session Number: 2

## 2021-09-30 ENCOUNTER — Ambulatory Visit
Admission: RE | Admit: 2021-09-30 | Discharge: 2021-09-30 | Disposition: A | Discharge status: Home / Self Care | Payer: Medicare PPO | Source: Ambulatory Visit | Attending: Radiation Oncology | Admitting: Radiation Oncology

## 2021-09-30 ENCOUNTER — Other Ambulatory Visit: Payer: Self-pay

## 2021-09-30 DIAGNOSIS — Z510 Encounter for antineoplastic radiation therapy: Secondary | ICD-10-CM | POA: Diagnosis not present

## 2021-09-30 DIAGNOSIS — C50312 Malignant neoplasm of lower-inner quadrant of left female breast: Secondary | ICD-10-CM | POA: Diagnosis not present

## 2021-09-30 DIAGNOSIS — Z170 Estrogen receptor positive status [ER+]: Secondary | ICD-10-CM | POA: Diagnosis not present

## 2021-09-30 LAB — RAD ONC ARIA SESSION SUMMARY
Course Elapsed Days: 2
Plan Fractions Treated to Date: 2
Plan Fractions Treated to Date: 3
Plan Prescribed Dose Per Fraction: 2 Gy
Plan Prescribed Dose Per Fraction: 2 Gy
Plan Total Fractions Prescribed: 13
Plan Total Fractions Prescribed: 25
Plan Total Prescribed Dose: 26 Gy
Plan Total Prescribed Dose: 50 Gy
Reference Point Dosage Given to Date: 6 Gy
Reference Point Dosage Given to Date: 6 Gy
Reference Point Session Dosage Given: 2 Gy
Reference Point Session Dosage Given: 2 Gy
Session Number: 3

## 2021-10-03 ENCOUNTER — Ambulatory Visit
Admission: RE | Admit: 2021-10-03 | Discharge: 2021-10-03 | Disposition: A | Discharge status: Home / Self Care | Payer: Medicare PPO | Source: Ambulatory Visit | Attending: Radiation Oncology | Admitting: Radiation Oncology

## 2021-10-03 ENCOUNTER — Other Ambulatory Visit: Payer: Self-pay

## 2021-10-03 ENCOUNTER — Ambulatory Visit: Payer: Medicare PPO

## 2021-10-03 DIAGNOSIS — Z510 Encounter for antineoplastic radiation therapy: Secondary | ICD-10-CM | POA: Diagnosis not present

## 2021-10-03 DIAGNOSIS — C50312 Malignant neoplasm of lower-inner quadrant of left female breast: Secondary | ICD-10-CM

## 2021-10-03 DIAGNOSIS — Z170 Estrogen receptor positive status [ER+]: Secondary | ICD-10-CM | POA: Diagnosis not present

## 2021-10-03 LAB — RAD ONC ARIA SESSION SUMMARY
Course Elapsed Days: 5
Plan Fractions Treated to Date: 2
Plan Fractions Treated to Date: 4
Plan Prescribed Dose Per Fraction: 2 Gy
Plan Prescribed Dose Per Fraction: 2 Gy
Plan Total Fractions Prescribed: 12
Plan Total Fractions Prescribed: 25
Plan Total Prescribed Dose: 24 Gy
Plan Total Prescribed Dose: 50 Gy
Reference Point Dosage Given to Date: 8 Gy
Reference Point Dosage Given to Date: 8 Gy
Reference Point Session Dosage Given: 2 Gy
Reference Point Session Dosage Given: 2 Gy
Session Number: 4

## 2021-10-03 MED ORDER — RADIAPLEXRX EX GEL: Freq: Once | CUTANEOUS | Status: AC

## 2021-10-03 MED ORDER — ALRA NON-METALLIC DEODORANT (RAD-ONC)
1.0000 | Freq: Once | TOPICAL | Status: AC
  Administered 2021-10-03: 1 via TOPICAL

## 2021-10-03 NOTE — Progress Notes (Signed)
Pt here for patient teaching.    Pt given Radiation and You booklet, skin care instructions, Alra deodorant, and Radiaplex gel.    Reviewed areas of pertinence such as fatigue, hair loss in treatment field, skin changes, breast tenderness, and breast swelling .   Pt able to give teach back of to pat skin, use unscented/gentle soap, and drink plenty of water,apply Radiaplex bid, avoid applying anything to skin within 4 hours of treatment, avoid wearing an under wire bra, and to use an electric razor if they must shave.   Pt verbalizes understanding of information given and will contact nursing with any questions or concerns.    [email or web address]

## 2021-10-04 ENCOUNTER — Inpatient Hospital Stay: Payer: Medicare PPO | Attending: Hematology and Oncology

## 2021-10-04 ENCOUNTER — Ambulatory Visit
Admission: RE | Admit: 2021-10-04 | Discharge: 2021-10-04 | Disposition: A | Discharge status: Home / Self Care | Payer: Medicare PPO | Source: Ambulatory Visit | Attending: Radiation Oncology | Admitting: Radiation Oncology

## 2021-10-04 ENCOUNTER — Inpatient Hospital Stay: Payer: Medicare PPO | Admitting: Hematology and Oncology

## 2021-10-04 ENCOUNTER — Other Ambulatory Visit: Payer: Self-pay

## 2021-10-04 VITALS — BP 144/64 | HR 76 | Temp 97.9°F | Resp 16

## 2021-10-04 DIAGNOSIS — Z79811 Long term (current) use of aromatase inhibitors: Secondary | ICD-10-CM | POA: Diagnosis not present

## 2021-10-04 DIAGNOSIS — Z170 Estrogen receptor positive status [ER+]: Secondary | ICD-10-CM

## 2021-10-04 DIAGNOSIS — C50312 Malignant neoplasm of lower-inner quadrant of left female breast: Secondary | ICD-10-CM | POA: Diagnosis not present

## 2021-10-04 DIAGNOSIS — Z79899 Other long term (current) drug therapy: Secondary | ICD-10-CM | POA: Insufficient documentation

## 2021-10-04 DIAGNOSIS — Z510 Encounter for antineoplastic radiation therapy: Secondary | ICD-10-CM | POA: Diagnosis not present

## 2021-10-04 LAB — RAD ONC ARIA SESSION SUMMARY
Course Elapsed Days: 6
Plan Fractions Treated to Date: 3
Plan Fractions Treated to Date: 5
Plan Prescribed Dose Per Fraction: 2 Gy
Plan Prescribed Dose Per Fraction: 2 Gy
Plan Total Fractions Prescribed: 13
Plan Total Fractions Prescribed: 25
Plan Total Prescribed Dose: 26 Gy
Plan Total Prescribed Dose: 50 Gy
Reference Point Dosage Given to Date: 10 Gy
Reference Point Dosage Given to Date: 10 Gy
Reference Point Session Dosage Given: 2 Gy
Reference Point Session Dosage Given: 2 Gy
Session Number: 5

## 2021-10-04 MED ORDER — SODIUM CHLORIDE 0.9 % IV SOLN: Freq: Once | INTRAVENOUS | Status: DC

## 2021-10-04 MED ORDER — ACETAMINOPHEN 325 MG PO TABS
650.0000 mg | ORAL_TABLET | Freq: Once | ORAL | Status: AC
  Administered 2021-10-04: 650 mg via ORAL

## 2021-10-04 MED ORDER — TRASTUZUMAB-HYALURONIDASE-OYSK 600-10000 MG-UNT/5ML ~~LOC~~ SOLN
600.0000 mg | Freq: Once | SUBCUTANEOUS | Status: AC
  Administered 2021-10-04: 600 mg via SUBCUTANEOUS
  Filled 2021-10-04: qty 5

## 2021-10-04 MED ORDER — DIPHENHYDRAMINE HCL 25 MG PO CAPS
25.0000 mg | ORAL_CAPSULE | Freq: Once | ORAL | Status: AC
  Administered 2021-10-04: 25 mg via ORAL

## 2021-10-04 NOTE — Progress Notes (Signed)
Patient and family remember refused to stay for 30 minute observation. They are aware to call for any issues and to dial 911 for an emergency.

## 2021-10-04 NOTE — Assessment & Plan Note (Addendum)
07/09/2021:Skin changes in the left breast, screening mammogram revealed thickening of the lower medial quadrant along with enlarged lymph node in the axilla (2 additional lymph nodes) by ultrasound breast tumor measured 3.9 cm, biopsy: Grade 2 IDC with lymphovascular invasion ER 100%, PR 100%, HER2 positive Ki-67 40%, lymph node biopsy positive  08/18/21: Left Mastectomy: Grade 3 IDC with micropapillary features with DCIS 5.8 cm 7/7 LN Positives, involves dermal stroma and lymphatics with focal Extra nodal invasion  09/12/2021: Bone scan focal radiotracer uptake right lateral femoral condyle (knee x-ray recommended) 09/12/2021: CT CAP: Several small left axillary lymph nodes.  No evidence of distant metastatic disease.  2 cm right thyroid nodule ------------------------------------------------------------------------------------------------------------------------ Treatment Plan: 1.adjuvant Herceptin with aromatase inhibitors started 09/14/2021 2.Adjuvant radiation to complete 11/09/2021  Adverse effects: 1. Fine macular rash: It is slightly better with topical cortisone cream.  I instructed her to resume letrozole and we will continue with the Herceptin and monitor the rash. 2. Denies any other adverse effects.  Return to clinic every 3 weeks for Herceptin and every 6 weeks for follow-up with me.

## 2021-10-05 ENCOUNTER — Ambulatory Visit
Admission: RE | Admit: 2021-10-05 | Discharge: 2021-10-05 | Disposition: A | Discharge status: Home / Self Care | Payer: Medicare PPO | Source: Ambulatory Visit | Attending: Radiation Oncology | Admitting: Radiation Oncology

## 2021-10-05 ENCOUNTER — Other Ambulatory Visit: Payer: Self-pay

## 2021-10-05 DIAGNOSIS — Z510 Encounter for antineoplastic radiation therapy: Secondary | ICD-10-CM | POA: Diagnosis not present

## 2021-10-05 DIAGNOSIS — Z170 Estrogen receptor positive status [ER+]: Secondary | ICD-10-CM | POA: Diagnosis not present

## 2021-10-05 DIAGNOSIS — C50312 Malignant neoplasm of lower-inner quadrant of left female breast: Secondary | ICD-10-CM | POA: Diagnosis not present

## 2021-10-05 LAB — RAD ONC ARIA SESSION SUMMARY
Course Elapsed Days: 7
Plan Fractions Treated to Date: 3
Plan Fractions Treated to Date: 6
Plan Prescribed Dose Per Fraction: 2 Gy
Plan Prescribed Dose Per Fraction: 2 Gy
Plan Total Fractions Prescribed: 12
Plan Total Fractions Prescribed: 25
Plan Total Prescribed Dose: 24 Gy
Plan Total Prescribed Dose: 50 Gy
Reference Point Dosage Given to Date: 12 Gy
Reference Point Dosage Given to Date: 12 Gy
Reference Point Session Dosage Given: 2 Gy
Reference Point Session Dosage Given: 2 Gy
Session Number: 6

## 2021-10-06 ENCOUNTER — Ambulatory Visit
Admission: RE | Admit: 2021-10-06 | Discharge: 2021-10-06 | Disposition: A | Discharge status: Home / Self Care | Payer: Medicare PPO | Source: Ambulatory Visit | Attending: Radiation Oncology | Admitting: Radiation Oncology

## 2021-10-06 ENCOUNTER — Other Ambulatory Visit: Payer: Self-pay

## 2021-10-06 DIAGNOSIS — Z170 Estrogen receptor positive status [ER+]: Secondary | ICD-10-CM | POA: Diagnosis not present

## 2021-10-06 DIAGNOSIS — C50312 Malignant neoplasm of lower-inner quadrant of left female breast: Secondary | ICD-10-CM | POA: Diagnosis not present

## 2021-10-06 DIAGNOSIS — Z510 Encounter for antineoplastic radiation therapy: Secondary | ICD-10-CM | POA: Diagnosis not present

## 2021-10-06 LAB — RAD ONC ARIA SESSION SUMMARY
Course Elapsed Days: 8
Plan Fractions Treated to Date: 4
Plan Fractions Treated to Date: 7
Plan Prescribed Dose Per Fraction: 2 Gy
Plan Prescribed Dose Per Fraction: 2 Gy
Plan Total Fractions Prescribed: 13
Plan Total Fractions Prescribed: 25
Plan Total Prescribed Dose: 26 Gy
Plan Total Prescribed Dose: 50 Gy
Reference Point Dosage Given to Date: 14 Gy
Reference Point Dosage Given to Date: 14 Gy
Reference Point Session Dosage Given: 2 Gy
Reference Point Session Dosage Given: 2 Gy
Session Number: 7

## 2021-10-07 ENCOUNTER — Ambulatory Visit: Admission: RE | Admit: 2021-10-07 | Payer: Medicare PPO | Source: Ambulatory Visit

## 2021-10-10 ENCOUNTER — Ambulatory Visit
Admission: RE | Admit: 2021-10-10 | Discharge: 2021-10-10 | Disposition: A | Discharge status: Home / Self Care | Payer: Medicare PPO | Source: Ambulatory Visit | Attending: Radiation Oncology | Admitting: Radiation Oncology

## 2021-10-10 ENCOUNTER — Other Ambulatory Visit: Payer: Self-pay

## 2021-10-10 ENCOUNTER — Ambulatory Visit: Payer: Medicare PPO

## 2021-10-10 DIAGNOSIS — Z170 Estrogen receptor positive status [ER+]: Secondary | ICD-10-CM | POA: Diagnosis not present

## 2021-10-10 DIAGNOSIS — C50312 Malignant neoplasm of lower-inner quadrant of left female breast: Secondary | ICD-10-CM | POA: Diagnosis not present

## 2021-10-10 DIAGNOSIS — Z510 Encounter for antineoplastic radiation therapy: Secondary | ICD-10-CM | POA: Diagnosis not present

## 2021-10-10 LAB — RAD ONC ARIA SESSION SUMMARY
Course Elapsed Days: 12
Plan Fractions Treated to Date: 4
Plan Fractions Treated to Date: 8
Plan Prescribed Dose Per Fraction: 2 Gy
Plan Prescribed Dose Per Fraction: 2 Gy
Plan Total Fractions Prescribed: 12
Plan Total Fractions Prescribed: 25
Plan Total Prescribed Dose: 24 Gy
Plan Total Prescribed Dose: 50 Gy
Reference Point Dosage Given to Date: 16 Gy
Reference Point Dosage Given to Date: 16 Gy
Reference Point Session Dosage Given: 2 Gy
Reference Point Session Dosage Given: 2 Gy
Session Number: 8

## 2021-10-11 ENCOUNTER — Other Ambulatory Visit: Payer: Self-pay

## 2021-10-11 ENCOUNTER — Ambulatory Visit
Admission: RE | Admit: 2021-10-11 | Discharge: 2021-10-11 | Disposition: A | Discharge status: Home / Self Care | Payer: Medicare PPO | Source: Ambulatory Visit | Attending: Radiation Oncology | Admitting: Radiation Oncology

## 2021-10-11 DIAGNOSIS — Z170 Estrogen receptor positive status [ER+]: Secondary | ICD-10-CM | POA: Diagnosis not present

## 2021-10-11 DIAGNOSIS — Z510 Encounter for antineoplastic radiation therapy: Secondary | ICD-10-CM | POA: Diagnosis not present

## 2021-10-11 DIAGNOSIS — C50312 Malignant neoplasm of lower-inner quadrant of left female breast: Secondary | ICD-10-CM | POA: Diagnosis not present

## 2021-10-11 DIAGNOSIS — R3915 Urgency of urination: Secondary | ICD-10-CM | POA: Diagnosis not present

## 2021-10-11 LAB — RAD ONC ARIA SESSION SUMMARY
Course Elapsed Days: 13
Plan Fractions Treated to Date: 5
Plan Fractions Treated to Date: 9
Plan Prescribed Dose Per Fraction: 2 Gy
Plan Prescribed Dose Per Fraction: 2 Gy
Plan Total Fractions Prescribed: 13
Plan Total Fractions Prescribed: 25
Plan Total Prescribed Dose: 26 Gy
Plan Total Prescribed Dose: 50 Gy
Reference Point Dosage Given to Date: 18 Gy
Reference Point Dosage Given to Date: 18 Gy
Reference Point Session Dosage Given: 2 Gy
Reference Point Session Dosage Given: 2 Gy
Session Number: 9

## 2021-10-12 ENCOUNTER — Other Ambulatory Visit: Payer: Self-pay

## 2021-10-12 ENCOUNTER — Ambulatory Visit
Admission: RE | Admit: 2021-10-12 | Discharge: 2021-10-12 | Disposition: A | Discharge status: Home / Self Care | Payer: Medicare PPO | Source: Ambulatory Visit | Attending: Radiation Oncology | Admitting: Radiation Oncology

## 2021-10-12 DIAGNOSIS — C50912 Malignant neoplasm of unspecified site of left female breast: Secondary | ICD-10-CM | POA: Diagnosis not present

## 2021-10-12 DIAGNOSIS — Z170 Estrogen receptor positive status [ER+]: Secondary | ICD-10-CM | POA: Diagnosis not present

## 2021-10-12 DIAGNOSIS — C50312 Malignant neoplasm of lower-inner quadrant of left female breast: Secondary | ICD-10-CM | POA: Diagnosis not present

## 2021-10-12 DIAGNOSIS — Z510 Encounter for antineoplastic radiation therapy: Secondary | ICD-10-CM | POA: Diagnosis not present

## 2021-10-12 LAB — RAD ONC ARIA SESSION SUMMARY
Course Elapsed Days: 14
Plan Fractions Treated to Date: 10
Plan Fractions Treated to Date: 5
Plan Prescribed Dose Per Fraction: 2 Gy
Plan Prescribed Dose Per Fraction: 2 Gy
Plan Total Fractions Prescribed: 12
Plan Total Fractions Prescribed: 25
Plan Total Prescribed Dose: 24 Gy
Plan Total Prescribed Dose: 50 Gy
Reference Point Dosage Given to Date: 20 Gy
Reference Point Dosage Given to Date: 20 Gy
Reference Point Session Dosage Given: 2 Gy
Reference Point Session Dosage Given: 2 Gy
Session Number: 10

## 2021-10-13 ENCOUNTER — Other Ambulatory Visit: Payer: Self-pay

## 2021-10-13 ENCOUNTER — Ambulatory Visit
Admission: RE | Admit: 2021-10-13 | Discharge: 2021-10-13 | Disposition: A | Discharge status: Home / Self Care | Payer: Medicare PPO | Source: Ambulatory Visit | Attending: Radiation Oncology | Admitting: Radiation Oncology

## 2021-10-13 DIAGNOSIS — C50312 Malignant neoplasm of lower-inner quadrant of left female breast: Secondary | ICD-10-CM | POA: Diagnosis not present

## 2021-10-13 DIAGNOSIS — Z510 Encounter for antineoplastic radiation therapy: Secondary | ICD-10-CM | POA: Diagnosis not present

## 2021-10-13 DIAGNOSIS — Z170 Estrogen receptor positive status [ER+]: Secondary | ICD-10-CM | POA: Diagnosis not present

## 2021-10-13 LAB — RAD ONC ARIA SESSION SUMMARY
Course Elapsed Days: 15
Plan Fractions Treated to Date: 11
Plan Fractions Treated to Date: 6
Plan Prescribed Dose Per Fraction: 2 Gy
Plan Prescribed Dose Per Fraction: 2 Gy
Plan Total Fractions Prescribed: 13
Plan Total Fractions Prescribed: 25
Plan Total Prescribed Dose: 26 Gy
Plan Total Prescribed Dose: 50 Gy
Reference Point Dosage Given to Date: 22 Gy
Reference Point Dosage Given to Date: 22 Gy
Reference Point Session Dosage Given: 2 Gy
Reference Point Session Dosage Given: 2 Gy
Session Number: 11

## 2021-10-14 ENCOUNTER — Ambulatory Visit
Admission: RE | Admit: 2021-10-14 | Discharge: 2021-10-14 | Disposition: A | Discharge status: Home / Self Care | Payer: Medicare PPO | Source: Ambulatory Visit | Attending: Radiation Oncology | Admitting: Radiation Oncology

## 2021-10-14 ENCOUNTER — Other Ambulatory Visit: Payer: Self-pay

## 2021-10-14 DIAGNOSIS — Z510 Encounter for antineoplastic radiation therapy: Secondary | ICD-10-CM | POA: Diagnosis not present

## 2021-10-14 DIAGNOSIS — Z170 Estrogen receptor positive status [ER+]: Secondary | ICD-10-CM | POA: Diagnosis not present

## 2021-10-14 DIAGNOSIS — C50312 Malignant neoplasm of lower-inner quadrant of left female breast: Secondary | ICD-10-CM | POA: Diagnosis not present

## 2021-10-14 LAB — RAD ONC ARIA SESSION SUMMARY
Course Elapsed Days: 16
Plan Fractions Treated to Date: 12
Plan Fractions Treated to Date: 6
Plan Prescribed Dose Per Fraction: 2 Gy
Plan Prescribed Dose Per Fraction: 2 Gy
Plan Total Fractions Prescribed: 12
Plan Total Fractions Prescribed: 25
Plan Total Prescribed Dose: 24 Gy
Plan Total Prescribed Dose: 50 Gy
Reference Point Dosage Given to Date: 24 Gy
Reference Point Dosage Given to Date: 24 Gy
Reference Point Session Dosage Given: 2 Gy
Reference Point Session Dosage Given: 2 Gy
Session Number: 12

## 2021-10-17 ENCOUNTER — Ambulatory Visit: Payer: Medicare PPO

## 2021-10-17 ENCOUNTER — Other Ambulatory Visit: Payer: Self-pay

## 2021-10-17 ENCOUNTER — Ambulatory Visit
Admission: RE | Admit: 2021-10-17 | Discharge: 2021-10-17 | Disposition: A | Discharge status: Home / Self Care | Payer: Medicare PPO | Source: Ambulatory Visit | Attending: Radiation Oncology | Admitting: Radiation Oncology

## 2021-10-17 DIAGNOSIS — C50312 Malignant neoplasm of lower-inner quadrant of left female breast: Secondary | ICD-10-CM | POA: Diagnosis not present

## 2021-10-17 DIAGNOSIS — Z510 Encounter for antineoplastic radiation therapy: Secondary | ICD-10-CM | POA: Diagnosis not present

## 2021-10-17 DIAGNOSIS — Z170 Estrogen receptor positive status [ER+]: Secondary | ICD-10-CM | POA: Diagnosis not present

## 2021-10-17 LAB — RAD ONC ARIA SESSION SUMMARY
Course Elapsed Days: 19
Plan Fractions Treated to Date: 13
Plan Fractions Treated to Date: 7
Plan Prescribed Dose Per Fraction: 2 Gy
Plan Prescribed Dose Per Fraction: 2 Gy
Plan Total Fractions Prescribed: 13
Plan Total Fractions Prescribed: 25
Plan Total Prescribed Dose: 26 Gy
Plan Total Prescribed Dose: 50 Gy
Reference Point Dosage Given to Date: 26 Gy
Reference Point Dosage Given to Date: 26 Gy
Reference Point Session Dosage Given: 2 Gy
Reference Point Session Dosage Given: 2 Gy
Session Number: 13

## 2021-10-18 ENCOUNTER — Ambulatory Visit
Admission: RE | Admit: 2021-10-18 | Discharge: 2021-10-18 | Disposition: A | Discharge status: Home / Self Care | Payer: Medicare PPO | Source: Ambulatory Visit | Attending: Radiation Oncology | Admitting: Radiation Oncology

## 2021-10-18 ENCOUNTER — Other Ambulatory Visit: Payer: Self-pay

## 2021-10-18 DIAGNOSIS — C50312 Malignant neoplasm of lower-inner quadrant of left female breast: Secondary | ICD-10-CM | POA: Diagnosis not present

## 2021-10-18 DIAGNOSIS — Z510 Encounter for antineoplastic radiation therapy: Secondary | ICD-10-CM | POA: Diagnosis not present

## 2021-10-18 DIAGNOSIS — Z170 Estrogen receptor positive status [ER+]: Secondary | ICD-10-CM | POA: Diagnosis not present

## 2021-10-18 LAB — RAD ONC ARIA SESSION SUMMARY
Course Elapsed Days: 20
Plan Fractions Treated to Date: 14
Plan Fractions Treated to Date: 7
Plan Prescribed Dose Per Fraction: 2 Gy
Plan Prescribed Dose Per Fraction: 2 Gy
Plan Total Fractions Prescribed: 12
Plan Total Fractions Prescribed: 25
Plan Total Prescribed Dose: 24 Gy
Plan Total Prescribed Dose: 50 Gy
Reference Point Dosage Given to Date: 28 Gy
Reference Point Dosage Given to Date: 28 Gy
Reference Point Session Dosage Given: 2 Gy
Reference Point Session Dosage Given: 2 Gy
Session Number: 14

## 2021-10-19 ENCOUNTER — Ambulatory Visit
Admission: RE | Admit: 2021-10-19 | Discharge: 2021-10-19 | Disposition: A | Discharge status: Home / Self Care | Payer: Medicare PPO | Source: Ambulatory Visit | Attending: Radiation Oncology | Admitting: Radiation Oncology

## 2021-10-19 ENCOUNTER — Other Ambulatory Visit: Payer: Self-pay

## 2021-10-19 DIAGNOSIS — Z170 Estrogen receptor positive status [ER+]: Secondary | ICD-10-CM | POA: Diagnosis not present

## 2021-10-19 DIAGNOSIS — Z510 Encounter for antineoplastic radiation therapy: Secondary | ICD-10-CM | POA: Diagnosis not present

## 2021-10-19 DIAGNOSIS — C50312 Malignant neoplasm of lower-inner quadrant of left female breast: Secondary | ICD-10-CM | POA: Diagnosis not present

## 2021-10-19 LAB — RAD ONC ARIA SESSION SUMMARY
Course Elapsed Days: 21
Plan Fractions Treated to Date: 15
Plan Fractions Treated to Date: 8
Plan Prescribed Dose Per Fraction: 2 Gy
Plan Prescribed Dose Per Fraction: 2 Gy
Plan Total Fractions Prescribed: 13
Plan Total Fractions Prescribed: 25
Plan Total Prescribed Dose: 26 Gy
Plan Total Prescribed Dose: 50 Gy
Reference Point Dosage Given to Date: 30 Gy
Reference Point Dosage Given to Date: 30 Gy
Reference Point Session Dosage Given: 2 Gy
Reference Point Session Dosage Given: 2 Gy
Session Number: 15

## 2021-10-20 ENCOUNTER — Ambulatory Visit
Admission: RE | Admit: 2021-10-20 | Discharge: 2021-10-20 | Disposition: A | Discharge status: Home / Self Care | Payer: Medicare PPO | Source: Ambulatory Visit | Attending: Radiation Oncology | Admitting: Radiation Oncology

## 2021-10-20 ENCOUNTER — Other Ambulatory Visit: Payer: Self-pay

## 2021-10-20 DIAGNOSIS — C50312 Malignant neoplasm of lower-inner quadrant of left female breast: Secondary | ICD-10-CM | POA: Diagnosis not present

## 2021-10-20 DIAGNOSIS — Z510 Encounter for antineoplastic radiation therapy: Secondary | ICD-10-CM | POA: Diagnosis not present

## 2021-10-20 DIAGNOSIS — Z170 Estrogen receptor positive status [ER+]: Secondary | ICD-10-CM | POA: Diagnosis not present

## 2021-10-20 LAB — RAD ONC ARIA SESSION SUMMARY
Course Elapsed Days: 22
Plan Fractions Treated to Date: 16
Plan Fractions Treated to Date: 8
Plan Prescribed Dose Per Fraction: 2 Gy
Plan Prescribed Dose Per Fraction: 2 Gy
Plan Total Fractions Prescribed: 12
Plan Total Fractions Prescribed: 25
Plan Total Prescribed Dose: 24 Gy
Plan Total Prescribed Dose: 50 Gy
Reference Point Dosage Given to Date: 32 Gy
Reference Point Dosage Given to Date: 32 Gy
Reference Point Session Dosage Given: 2 Gy
Reference Point Session Dosage Given: 2 Gy
Session Number: 16

## 2021-10-21 ENCOUNTER — Other Ambulatory Visit: Payer: Self-pay

## 2021-10-21 ENCOUNTER — Ambulatory Visit
Admission: RE | Admit: 2021-10-21 | Discharge: 2021-10-21 | Disposition: A | Discharge status: Home / Self Care | Payer: Medicare PPO | Source: Ambulatory Visit | Attending: Radiation Oncology | Admitting: Radiation Oncology

## 2021-10-21 DIAGNOSIS — C50312 Malignant neoplasm of lower-inner quadrant of left female breast: Secondary | ICD-10-CM | POA: Insufficient documentation

## 2021-10-21 DIAGNOSIS — Z510 Encounter for antineoplastic radiation therapy: Secondary | ICD-10-CM | POA: Diagnosis not present

## 2021-10-21 DIAGNOSIS — Z5112 Encounter for antineoplastic immunotherapy: Secondary | ICD-10-CM | POA: Insufficient documentation

## 2021-10-21 DIAGNOSIS — Z170 Estrogen receptor positive status [ER+]: Secondary | ICD-10-CM | POA: Insufficient documentation

## 2021-10-21 LAB — RAD ONC ARIA SESSION SUMMARY
Course Elapsed Days: 23
Plan Fractions Treated to Date: 17
Plan Fractions Treated to Date: 9
Plan Prescribed Dose Per Fraction: 2 Gy
Plan Prescribed Dose Per Fraction: 2 Gy
Plan Total Fractions Prescribed: 13
Plan Total Fractions Prescribed: 25
Plan Total Prescribed Dose: 26 Gy
Plan Total Prescribed Dose: 50 Gy
Reference Point Dosage Given to Date: 34 Gy
Reference Point Dosage Given to Date: 34 Gy
Reference Point Session Dosage Given: 2 Gy
Reference Point Session Dosage Given: 2 Gy
Session Number: 17

## 2021-10-22 ENCOUNTER — Other Ambulatory Visit: Payer: Self-pay | Admitting: Hematology and Oncology

## 2021-10-22 DIAGNOSIS — Z170 Estrogen receptor positive status [ER+]: Secondary | ICD-10-CM

## 2021-10-25 ENCOUNTER — Inpatient Hospital Stay: Payer: Medicare PPO | Attending: Hematology and Oncology

## 2021-10-25 ENCOUNTER — Other Ambulatory Visit: Payer: Self-pay

## 2021-10-25 ENCOUNTER — Ambulatory Visit: Admission: RE | Admit: 2021-10-25 | Payer: Medicare PPO | Source: Ambulatory Visit

## 2021-10-25 ENCOUNTER — Ambulatory Visit
Admission: RE | Admit: 2021-10-25 | Discharge: 2021-10-25 | Disposition: A | Discharge status: Home / Self Care | Payer: Medicare PPO | Source: Ambulatory Visit | Attending: Radiation Oncology | Admitting: Radiation Oncology

## 2021-10-25 VITALS — BP 141/70 | HR 74 | Temp 97.6°F | Resp 18 | Ht 68.0 in | Wt 173.0 lb

## 2021-10-25 DIAGNOSIS — Z510 Encounter for antineoplastic radiation therapy: Secondary | ICD-10-CM | POA: Diagnosis not present

## 2021-10-25 DIAGNOSIS — C50312 Malignant neoplasm of lower-inner quadrant of left female breast: Secondary | ICD-10-CM | POA: Diagnosis not present

## 2021-10-25 DIAGNOSIS — Z170 Estrogen receptor positive status [ER+]: Secondary | ICD-10-CM | POA: Diagnosis not present

## 2021-10-25 LAB — RAD ONC ARIA SESSION SUMMARY
Course Elapsed Days: 27
Plan Fractions Treated to Date: 18
Plan Fractions Treated to Date: 9
Plan Prescribed Dose Per Fraction: 2 Gy
Plan Prescribed Dose Per Fraction: 2 Gy
Plan Total Fractions Prescribed: 12
Plan Total Fractions Prescribed: 25
Plan Total Prescribed Dose: 24 Gy
Plan Total Prescribed Dose: 50 Gy
Reference Point Dosage Given to Date: 36 Gy
Reference Point Dosage Given to Date: 36 Gy
Reference Point Session Dosage Given: 2 Gy
Reference Point Session Dosage Given: 2 Gy
Session Number: 18

## 2021-10-25 MED ORDER — DIPHENHYDRAMINE HCL 25 MG PO CAPS
25.0000 mg | ORAL_CAPSULE | Freq: Once | ORAL | Status: AC
  Administered 2021-10-25: 25 mg via ORAL
  Filled 2021-10-25: qty 1

## 2021-10-25 MED ORDER — TRASTUZUMAB-HYALURONIDASE-OYSK 600-10000 MG-UNT/5ML ~~LOC~~ SOLN
600.0000 mg | Freq: Once | SUBCUTANEOUS | Status: AC
  Administered 2021-10-25: 600 mg via SUBCUTANEOUS
  Filled 2021-10-25: qty 5

## 2021-10-25 NOTE — Patient Instructions (Signed)
[name]  Discharge Instructions: Thank you for choosing [facility] to provide your oncology and hematology care.   If you have a lab appointment with the [facility], please go directly to the [facility] and check in at the registration area.   Wear comfortable clothing and clothing appropriate for easy access to any Portacath or PICC line.   We strive to give you quality time with your provider. You may need to reschedule your appointment if you arrive late (15 or more minutes).  Arriving late affects you and other patients whose appointments are after yours.  Also, if you miss three or more appointments without notifying the office, you may be dismissed from the clinic at the provider's discretion.      For prescription refill requests, have your pharmacy contact our office and allow 72 hours for refills to be completed.    Today you received the following chemotherapy and/or immunotherapy agents Herceptin Hylecta     To help prevent nausea and vomiting after your treatment, we encourage you to take your nausea medication as directed.  BELOW ARE SYMPTOMS THAT SHOULD BE REPORTED IMMEDIATELY: *FEVER GREATER THAN 100.4 F (38 C) OR HIGHER *CHILLS OR SWEATING *NAUSEA AND VOMITING THAT IS NOT CONTROLLED WITH YOUR NAUSEA MEDICATION *UNUSUAL SHORTNESS OF BREATH *UNUSUAL BRUISING OR BLEEDING *URINARY PROBLEMS (pain or burning when urinating, or frequent urination) *BOWEL PROBLEMS (unusual diarrhea, constipation, pain near the anus) TENDERNESS IN MOUTH AND THROAT WITH OR WITHOUT PRESENCE OF ULCERS (sore throat, sores in mouth, or a toothache) UNUSUAL RASH, SWELLING OR PAIN  UNUSUAL VAGINAL DISCHARGE OR ITCHING   Items with * indicate a potential emergency and should be followed up as soon as possible or go to the Emergency Department if any problems should occur.  Please show the CHEMOTHERAPY ALERT CARD or IMMUNOTHERAPY ALERT CARD at  check-in to the Emergency Department and triage nurse.  Should you have questions after your visit or need to cancel or reschedule your appointment, please contact [facility]  Dept: [phone]  and follow the prompts.  Office hours are 8:00 a.m. to 4:30 p.m. Monday - Friday. Please note that voicemails left after 4:00 p.m. may not be returned until the following business day.  We are closed weekends and major holidays. You have access to a nurse at all times for urgent questions. Please call the main number to the clinic Dept: [phone] and follow the prompts.   For any non-urgent questions, you may also contact your provider using MyChart. We now offer e-Visits for anyone 75 and older to request care online for non-urgent symptoms. For details visit [email or web address].   Also download the MyChart app! Go to the app store, search "MyChart", open the app, select [facility], and log in with your MyChart username and password.  Masks are optional in the cancer centers. If you would like for your care team to wear a mask while they are taking care of you, please let them know. For doctor visits, patients may have with them one support person who is at least 85 years old. At this time, visitors are not allowed in the infusion area.   Trastuzumab; Hyaluronidase injection What is this medication? TRASTUZUMAB; HYALURONIDASE (tras TOO zoo mab / hye al ur ON i dase) is used to treat breast cancer and stomach cancer. Trastuzumab is a monoclonal antibody. Hyaluronidase is used to improve the effects of trastuzumab. This medicine may be used for  other purposes; ask your health care provider or pharmacist if you have questions. COMMON BRAND NAME(S): HERCEPTIN HYLECTA What should I tell my care team before I take this medication? They need to know if you have any of these conditions: heart disease heart failure lung or breathing disease, like asthma an unusual or  allergic reaction to trastuzumab, or other medications, foods, dyes, or preservatives pregnant or trying to get pregnant breast-feeding How should I use this medication? This medicine is for injection under the skin. It is given by a health care professional in a hospital or clinic setting. Talk to your pediatrician regarding the use of this medicine in children. This medicine is not approved for use in children. Overdosage: If you think you have taken too much of this medicine contact a poison control center or emergency room at once. NOTE: This medicine is only for you. Do not share this medicine with others. What if I miss a dose? It is important not to miss a dose. Call your doctor or health care professional if you are unable to keep an appointment. What may interact with this medication? This medicine may interact with the following medications: certain types of chemotherapy, such as daunorubicin, doxorubicin, epirubicin, and idarubicin This list may not describe all possible interactions. Give your health care provider a list of all the medicines, herbs, non-prescription drugs, or dietary supplements you use. Also tell them if you smoke, drink alcohol, or use illegal drugs. Some items may interact with your medicine. What should I watch for while using this medication? Visit your doctor for checks on your progress. Report any side effects. Continue your course of treatment even though you feel ill unless your doctor tells you to stop. Call your doctor or health care professional for advice if you get a fever, chills or sore throat, or other symptoms of a cold or flu. Do not treat yourself. Try to avoid being around people who are sick. You may experience fever, chills and shaking during your first infusion. These effects are usually mild and can be treated with other medicines. Report any side effects during the infusion to your health care professional. Fever and chills usually do not happen  with later infusions. Do not become pregnant while taking this medicine or for 7 months after stopping it. Women should inform their doctor if they wish to become pregnant or think they might be pregnant. Women of child-bearing potential will need to have a negative pregnancy test before starting this medicine. There is a potential for serious side effects to an unborn child. Talk to your health care professional or pharmacist for more information. Do not breast-feed an infant while taking this medicine or for 7 months after stopping it. What side effects may I notice from receiving this medication? Side effects that you should report to your doctor or health care professional as soon as possible: allergic reactions like skin rash, itching or hives, swelling of the face, lips, or tongue breathing problems chest pain or palpitations cough fever general ill feeling or flu-like symptoms signs of worsening heart failure like breathing problems; swelling in your legs and feet Side effects that usually do not require medical attention (report these to your doctor or health care professional if they continue or are bothersome): bone pain changes in taste diarrhea joint pain nausea/vomiting unusually weak or tired weight loss This list may not describe all possible side effects. Call your doctor for medical advice about side effects. You may report side effects  to FDA at 1-800-FDA-1088. Where should I keep my medication? This drug is given in a hospital or clinic and will not be stored at home. NOTE: This sheet is a summary. It may not cover all possible information. If you have questions about this medicine, talk to your doctor, pharmacist, or health care provider.  2023 Elsevier/Gold Standard (2017-06-25 00:00:00)

## 2021-10-26 ENCOUNTER — Ambulatory Visit
Admission: RE | Admit: 2021-10-26 | Discharge: 2021-10-26 | Disposition: A | Discharge status: Home / Self Care | Payer: Medicare PPO | Source: Ambulatory Visit | Attending: Radiation Oncology | Admitting: Radiation Oncology

## 2021-10-26 ENCOUNTER — Other Ambulatory Visit: Payer: Self-pay

## 2021-10-26 DIAGNOSIS — Z510 Encounter for antineoplastic radiation therapy: Secondary | ICD-10-CM | POA: Diagnosis not present

## 2021-10-26 DIAGNOSIS — Z170 Estrogen receptor positive status [ER+]: Secondary | ICD-10-CM | POA: Diagnosis not present

## 2021-10-26 DIAGNOSIS — C50312 Malignant neoplasm of lower-inner quadrant of left female breast: Secondary | ICD-10-CM | POA: Diagnosis not present

## 2021-10-26 LAB — RAD ONC ARIA SESSION SUMMARY
Course Elapsed Days: 28
Plan Fractions Treated to Date: 10
Plan Fractions Treated to Date: 19
Plan Prescribed Dose Per Fraction: 2 Gy
Plan Prescribed Dose Per Fraction: 2 Gy
Plan Total Fractions Prescribed: 13
Plan Total Fractions Prescribed: 25
Plan Total Prescribed Dose: 26 Gy
Plan Total Prescribed Dose: 50 Gy
Reference Point Dosage Given to Date: 38 Gy
Reference Point Dosage Given to Date: 38 Gy
Reference Point Session Dosage Given: 2 Gy
Reference Point Session Dosage Given: 2 Gy
Session Number: 19

## 2021-10-27 ENCOUNTER — Ambulatory Visit
Admission: RE | Admit: 2021-10-27 | Discharge: 2021-10-27 | Disposition: A | Discharge status: Home / Self Care | Payer: Medicare PPO | Source: Ambulatory Visit | Attending: Radiation Oncology | Admitting: Radiation Oncology

## 2021-10-27 ENCOUNTER — Other Ambulatory Visit: Payer: Self-pay

## 2021-10-27 DIAGNOSIS — C50312 Malignant neoplasm of lower-inner quadrant of left female breast: Secondary | ICD-10-CM | POA: Diagnosis not present

## 2021-10-27 DIAGNOSIS — Z170 Estrogen receptor positive status [ER+]: Secondary | ICD-10-CM | POA: Diagnosis not present

## 2021-10-27 DIAGNOSIS — Z510 Encounter for antineoplastic radiation therapy: Secondary | ICD-10-CM | POA: Diagnosis not present

## 2021-10-27 LAB — RAD ONC ARIA SESSION SUMMARY
Course Elapsed Days: 29
Plan Fractions Treated to Date: 10
Plan Fractions Treated to Date: 20
Plan Prescribed Dose Per Fraction: 2 Gy
Plan Prescribed Dose Per Fraction: 2 Gy
Plan Total Fractions Prescribed: 12
Plan Total Fractions Prescribed: 25
Plan Total Prescribed Dose: 24 Gy
Plan Total Prescribed Dose: 50 Gy
Reference Point Dosage Given to Date: 40 Gy
Reference Point Dosage Given to Date: 40 Gy
Reference Point Session Dosage Given: 2 Gy
Reference Point Session Dosage Given: 2 Gy
Session Number: 20

## 2021-10-28 ENCOUNTER — Other Ambulatory Visit: Payer: Self-pay

## 2021-10-28 ENCOUNTER — Ambulatory Visit
Admission: RE | Admit: 2021-10-28 | Discharge: 2021-10-28 | Disposition: A | Discharge status: Home / Self Care | Payer: Medicare PPO | Source: Ambulatory Visit | Attending: Radiation Oncology | Admitting: Radiation Oncology

## 2021-10-28 DIAGNOSIS — Z510 Encounter for antineoplastic radiation therapy: Secondary | ICD-10-CM | POA: Diagnosis not present

## 2021-10-28 DIAGNOSIS — Z170 Estrogen receptor positive status [ER+]: Secondary | ICD-10-CM | POA: Diagnosis not present

## 2021-10-28 DIAGNOSIS — C50312 Malignant neoplasm of lower-inner quadrant of left female breast: Secondary | ICD-10-CM | POA: Diagnosis not present

## 2021-10-28 LAB — RAD ONC ARIA SESSION SUMMARY
Course Elapsed Days: 30
Plan Fractions Treated to Date: 11
Plan Fractions Treated to Date: 21
Plan Prescribed Dose Per Fraction: 2 Gy
Plan Prescribed Dose Per Fraction: 2 Gy
Plan Total Fractions Prescribed: 13
Plan Total Fractions Prescribed: 25
Plan Total Prescribed Dose: 26 Gy
Plan Total Prescribed Dose: 50 Gy
Reference Point Dosage Given to Date: 42 Gy
Reference Point Dosage Given to Date: 42 Gy
Reference Point Session Dosage Given: 2 Gy
Reference Point Session Dosage Given: 2 Gy
Session Number: 21

## 2021-10-31 ENCOUNTER — Other Ambulatory Visit: Payer: Self-pay

## 2021-10-31 ENCOUNTER — Ambulatory Visit
Admission: RE | Admit: 2021-10-31 | Discharge: 2021-10-31 | Disposition: A | Discharge status: Home / Self Care | Payer: Medicare PPO | Source: Ambulatory Visit | Attending: Radiation Oncology | Admitting: Radiation Oncology

## 2021-10-31 ENCOUNTER — Ambulatory Visit: Payer: Medicare PPO | Admitting: Radiation Oncology

## 2021-10-31 ENCOUNTER — Ambulatory Visit: Payer: Medicare PPO

## 2021-10-31 DIAGNOSIS — C50312 Malignant neoplasm of lower-inner quadrant of left female breast: Secondary | ICD-10-CM | POA: Diagnosis not present

## 2021-10-31 DIAGNOSIS — Z170 Estrogen receptor positive status [ER+]: Secondary | ICD-10-CM | POA: Diagnosis not present

## 2021-10-31 DIAGNOSIS — Z510 Encounter for antineoplastic radiation therapy: Secondary | ICD-10-CM | POA: Diagnosis not present

## 2021-10-31 LAB — RAD ONC ARIA SESSION SUMMARY
Course Elapsed Days: 33
Plan Fractions Treated to Date: 11
Plan Fractions Treated to Date: 22
Plan Prescribed Dose Per Fraction: 2 Gy
Plan Prescribed Dose Per Fraction: 2 Gy
Plan Total Fractions Prescribed: 12
Plan Total Fractions Prescribed: 25
Plan Total Prescribed Dose: 24 Gy
Plan Total Prescribed Dose: 50 Gy
Reference Point Dosage Given to Date: 44 Gy
Reference Point Dosage Given to Date: 44 Gy
Reference Point Session Dosage Given: 2 Gy
Reference Point Session Dosage Given: 2 Gy
Session Number: 22

## 2021-11-01 ENCOUNTER — Ambulatory Visit
Admission: RE | Admit: 2021-11-01 | Discharge: 2021-11-01 | Disposition: A | Discharge status: Home / Self Care | Payer: Medicare PPO | Source: Ambulatory Visit | Attending: Radiation Oncology | Admitting: Radiation Oncology

## 2021-11-01 ENCOUNTER — Other Ambulatory Visit: Payer: Self-pay

## 2021-11-01 DIAGNOSIS — Z510 Encounter for antineoplastic radiation therapy: Secondary | ICD-10-CM | POA: Diagnosis not present

## 2021-11-01 DIAGNOSIS — C50312 Malignant neoplasm of lower-inner quadrant of left female breast: Secondary | ICD-10-CM | POA: Diagnosis not present

## 2021-11-01 DIAGNOSIS — Z170 Estrogen receptor positive status [ER+]: Secondary | ICD-10-CM | POA: Diagnosis not present

## 2021-11-01 LAB — RAD ONC ARIA SESSION SUMMARY
Course Elapsed Days: 34
Plan Fractions Treated to Date: 12
Plan Fractions Treated to Date: 23
Plan Prescribed Dose Per Fraction: 2 Gy
Plan Prescribed Dose Per Fraction: 2 Gy
Plan Total Fractions Prescribed: 13
Plan Total Fractions Prescribed: 25
Plan Total Prescribed Dose: 26 Gy
Plan Total Prescribed Dose: 50 Gy
Reference Point Dosage Given to Date: 46 Gy
Reference Point Dosage Given to Date: 46 Gy
Reference Point Session Dosage Given: 2 Gy
Reference Point Session Dosage Given: 2 Gy
Session Number: 23

## 2021-11-02 ENCOUNTER — Other Ambulatory Visit: Payer: Self-pay

## 2021-11-02 ENCOUNTER — Ambulatory Visit
Admission: RE | Admit: 2021-11-02 | Discharge: 2021-11-02 | Disposition: A | Discharge status: Home / Self Care | Payer: Medicare PPO | Source: Ambulatory Visit | Attending: Radiation Oncology | Admitting: Radiation Oncology

## 2021-11-02 DIAGNOSIS — C50312 Malignant neoplasm of lower-inner quadrant of left female breast: Secondary | ICD-10-CM | POA: Diagnosis not present

## 2021-11-02 DIAGNOSIS — Z170 Estrogen receptor positive status [ER+]: Secondary | ICD-10-CM | POA: Diagnosis not present

## 2021-11-02 DIAGNOSIS — Z510 Encounter for antineoplastic radiation therapy: Secondary | ICD-10-CM | POA: Diagnosis not present

## 2021-11-02 LAB — RAD ONC ARIA SESSION SUMMARY
Course Elapsed Days: 35
Plan Fractions Treated to Date: 12
Plan Fractions Treated to Date: 24
Plan Prescribed Dose Per Fraction: 2 Gy
Plan Prescribed Dose Per Fraction: 2 Gy
Plan Total Fractions Prescribed: 12
Plan Total Fractions Prescribed: 25
Plan Total Prescribed Dose: 24 Gy
Plan Total Prescribed Dose: 50 Gy
Reference Point Dosage Given to Date: 48 Gy
Reference Point Dosage Given to Date: 48 Gy
Reference Point Session Dosage Given: 2 Gy
Reference Point Session Dosage Given: 2 Gy
Session Number: 24

## 2021-11-03 ENCOUNTER — Other Ambulatory Visit: Payer: Self-pay

## 2021-11-03 ENCOUNTER — Ambulatory Visit: Payer: Medicare PPO

## 2021-11-03 ENCOUNTER — Ambulatory Visit
Admission: RE | Admit: 2021-11-03 | Discharge: 2021-11-03 | Disposition: A | Discharge status: Home / Self Care | Payer: Medicare PPO | Source: Ambulatory Visit | Attending: Radiation Oncology | Admitting: Radiation Oncology

## 2021-11-03 DIAGNOSIS — Z170 Estrogen receptor positive status [ER+]: Secondary | ICD-10-CM | POA: Diagnosis not present

## 2021-11-03 DIAGNOSIS — Z510 Encounter for antineoplastic radiation therapy: Secondary | ICD-10-CM | POA: Diagnosis not present

## 2021-11-03 DIAGNOSIS — C50312 Malignant neoplasm of lower-inner quadrant of left female breast: Secondary | ICD-10-CM | POA: Diagnosis not present

## 2021-11-03 LAB — RAD ONC ARIA SESSION SUMMARY
Course Elapsed Days: 36
Plan Fractions Treated to Date: 13
Plan Fractions Treated to Date: 25
Plan Prescribed Dose Per Fraction: 2 Gy
Plan Prescribed Dose Per Fraction: 2 Gy
Plan Total Fractions Prescribed: 13
Plan Total Fractions Prescribed: 25
Plan Total Prescribed Dose: 26 Gy
Plan Total Prescribed Dose: 50 Gy
Reference Point Dosage Given to Date: 50 Gy
Reference Point Dosage Given to Date: 50 Gy
Reference Point Session Dosage Given: 2 Gy
Reference Point Session Dosage Given: 2 Gy
Session Number: 25

## 2021-11-04 ENCOUNTER — Other Ambulatory Visit: Payer: Self-pay

## 2021-11-04 ENCOUNTER — Ambulatory Visit: Payer: Medicare PPO

## 2021-11-04 ENCOUNTER — Ambulatory Visit
Admission: RE | Admit: 2021-11-04 | Discharge: 2021-11-04 | Disposition: A | Discharge status: Home / Self Care | Payer: Medicare PPO | Source: Ambulatory Visit | Attending: Radiation Oncology | Admitting: Radiation Oncology

## 2021-11-04 DIAGNOSIS — C50312 Malignant neoplasm of lower-inner quadrant of left female breast: Secondary | ICD-10-CM | POA: Diagnosis not present

## 2021-11-04 DIAGNOSIS — Z170 Estrogen receptor positive status [ER+]: Secondary | ICD-10-CM | POA: Diagnosis not present

## 2021-11-04 DIAGNOSIS — Z510 Encounter for antineoplastic radiation therapy: Secondary | ICD-10-CM | POA: Diagnosis not present

## 2021-11-04 LAB — RAD ONC ARIA SESSION SUMMARY
Course Elapsed Days: 37
Plan Fractions Treated to Date: 1
Plan Prescribed Dose Per Fraction: 2 Gy
Plan Total Fractions Prescribed: 5
Plan Total Prescribed Dose: 10 Gy
Reference Point Dosage Given to Date: 2 Gy
Reference Point Session Dosage Given: 2 Gy
Session Number: 26

## 2021-11-07 ENCOUNTER — Ambulatory Visit: Payer: Medicare PPO

## 2021-11-07 ENCOUNTER — Ambulatory Visit
Admission: RE | Admit: 2021-11-07 | Discharge: 2021-11-07 | Disposition: A | Discharge status: Home / Self Care | Payer: Medicare PPO | Source: Ambulatory Visit | Attending: Radiation Oncology | Admitting: Radiation Oncology

## 2021-11-07 ENCOUNTER — Other Ambulatory Visit: Payer: Self-pay

## 2021-11-07 DIAGNOSIS — Z510 Encounter for antineoplastic radiation therapy: Secondary | ICD-10-CM | POA: Diagnosis not present

## 2021-11-07 DIAGNOSIS — C50312 Malignant neoplasm of lower-inner quadrant of left female breast: Secondary | ICD-10-CM | POA: Diagnosis not present

## 2021-11-07 DIAGNOSIS — Z170 Estrogen receptor positive status [ER+]: Secondary | ICD-10-CM | POA: Diagnosis not present

## 2021-11-07 LAB — RAD ONC ARIA SESSION SUMMARY
Course Elapsed Days: 40
Plan Fractions Treated to Date: 2
Plan Prescribed Dose Per Fraction: 2 Gy
Plan Total Fractions Prescribed: 5
Plan Total Prescribed Dose: 10 Gy
Reference Point Dosage Given to Date: 4 Gy
Reference Point Session Dosage Given: 2 Gy
Session Number: 27

## 2021-11-07 MED ORDER — SILVER SULFADIAZINE 1 % EX CREA: TOPICAL_CREAM | Freq: Two times a day (BID) | CUTANEOUS | Status: DC

## 2021-11-08 ENCOUNTER — Ambulatory Visit
Admission: RE | Admit: 2021-11-08 | Discharge: 2021-11-08 | Disposition: A | Discharge status: Home / Self Care | Payer: Medicare PPO | Source: Ambulatory Visit | Attending: Radiation Oncology | Admitting: Radiation Oncology

## 2021-11-08 ENCOUNTER — Other Ambulatory Visit: Payer: Self-pay

## 2021-11-08 ENCOUNTER — Ambulatory Visit: Payer: Medicare PPO

## 2021-11-08 DIAGNOSIS — C50312 Malignant neoplasm of lower-inner quadrant of left female breast: Secondary | ICD-10-CM | POA: Diagnosis not present

## 2021-11-08 DIAGNOSIS — Z510 Encounter for antineoplastic radiation therapy: Secondary | ICD-10-CM | POA: Diagnosis not present

## 2021-11-08 DIAGNOSIS — Z170 Estrogen receptor positive status [ER+]: Secondary | ICD-10-CM | POA: Diagnosis not present

## 2021-11-08 LAB — RAD ONC ARIA SESSION SUMMARY
Course Elapsed Days: 41
Plan Fractions Treated to Date: 3
Plan Prescribed Dose Per Fraction: 2 Gy
Plan Total Fractions Prescribed: 5
Plan Total Prescribed Dose: 10 Gy
Reference Point Dosage Given to Date: 6 Gy
Reference Point Session Dosage Given: 2 Gy
Session Number: 28

## 2021-11-09 ENCOUNTER — Ambulatory Visit: Payer: Medicare PPO

## 2021-11-09 ENCOUNTER — Encounter: Payer: Self-pay | Admitting: *Deleted

## 2021-11-10 ENCOUNTER — Other Ambulatory Visit: Payer: Self-pay

## 2021-11-10 ENCOUNTER — Ambulatory Visit
Admission: RE | Admit: 2021-11-10 | Discharge: 2021-11-10 | Disposition: A | Discharge status: Home / Self Care | Payer: Medicare PPO | Source: Ambulatory Visit | Attending: Radiation Oncology | Admitting: Radiation Oncology

## 2021-11-10 ENCOUNTER — Ambulatory Visit: Payer: Medicare PPO

## 2021-11-10 DIAGNOSIS — C50312 Malignant neoplasm of lower-inner quadrant of left female breast: Secondary | ICD-10-CM | POA: Diagnosis not present

## 2021-11-10 DIAGNOSIS — Z170 Estrogen receptor positive status [ER+]: Secondary | ICD-10-CM | POA: Diagnosis not present

## 2021-11-10 DIAGNOSIS — Z510 Encounter for antineoplastic radiation therapy: Secondary | ICD-10-CM | POA: Diagnosis not present

## 2021-11-10 LAB — RAD ONC ARIA SESSION SUMMARY
Course Elapsed Days: 43
Plan Fractions Treated to Date: 4
Plan Prescribed Dose Per Fraction: 2 Gy
Plan Total Fractions Prescribed: 5
Plan Total Prescribed Dose: 10 Gy
Reference Point Dosage Given to Date: 8 Gy
Reference Point Session Dosage Given: 2 Gy
Session Number: 29

## 2021-11-11 ENCOUNTER — Encounter: Payer: Self-pay | Admitting: Radiation Oncology

## 2021-11-11 ENCOUNTER — Ambulatory Visit
Admission: RE | Admit: 2021-11-11 | Discharge: 2021-11-11 | Disposition: A | Discharge status: Home / Self Care | Payer: Medicare PPO | Source: Ambulatory Visit | Attending: Radiation Oncology | Admitting: Radiation Oncology

## 2021-11-11 ENCOUNTER — Other Ambulatory Visit: Payer: Self-pay

## 2021-11-11 ENCOUNTER — Other Ambulatory Visit: Payer: Self-pay | Admitting: Physician Assistant

## 2021-11-11 DIAGNOSIS — C50312 Malignant neoplasm of lower-inner quadrant of left female breast: Secondary | ICD-10-CM | POA: Diagnosis not present

## 2021-11-11 DIAGNOSIS — F330 Major depressive disorder, recurrent, mild: Secondary | ICD-10-CM

## 2021-11-11 DIAGNOSIS — Z170 Estrogen receptor positive status [ER+]: Secondary | ICD-10-CM | POA: Diagnosis not present

## 2021-11-11 DIAGNOSIS — Z510 Encounter for antineoplastic radiation therapy: Secondary | ICD-10-CM | POA: Diagnosis not present

## 2021-11-11 LAB — RAD ONC ARIA SESSION SUMMARY
Course Elapsed Days: 44
Plan Fractions Treated to Date: 5
Plan Prescribed Dose Per Fraction: 2 Gy
Plan Total Fractions Prescribed: 5
Plan Total Prescribed Dose: 10 Gy
Reference Point Dosage Given to Date: 10 Gy
Reference Point Session Dosage Given: 2 Gy
Session Number: 30

## 2021-11-12 NOTE — Progress Notes (Signed)
Patient Care Team: Lorrene Reid, PA-C as PCP - General (Physician Assistant) [name], MD as Consulting Physician (Cardiology) Sydnee Levans, MD as Referring Physician (Dermatology) Rehabilitation, Irving Tat, Eustace Quail, DO as Consulting Physician (Neurology) Mauro Kaufmann, RN as Oncology Nurse Navigator Carlynn Spry, Charlott Holler, RN as Oncology Nurse Navigator Coralie Keens, MD as Consulting Physician (General Surgery) Nicholas Lose, MD as Consulting Physician (Hematology and Oncology) Eppie Gibson, MD as Attending Physician (Radiation Oncology) Lorrene Reid, PA-C as Physician Assistant (Physician Assistant)  DIAGNOSIS: No diagnosis found.  SUMMARY OF ONCOLOGIC HISTORY: Oncology History  Malignant neoplasm of lower-inner quadrant of left breast in female, estrogen receptor positive ([place])  07/11/2021 Initial Diagnosis   Skin changes in the left breast, screening mammogram revealed thickening of the lower medial quadrant along with enlarged lymph node in the axilla (2 additional lymph nodes) by ultrasound breast tumor measured 3.9 cm, biopsy: Grade 2 IDC with lymphovascular invasion ER 100%, PR 100%, HER2 positive Ki-67 40%, lymph node biopsy positive   07/13/2021 Cancer Staging   Staging form: Breast, AJCC 8th Edition - Clinical: Stage IB (cT2, cN1, cM0, G2, ER+, PR+, HER2+) - Signed by Nicholas Lose, MD on 07/13/2021 Stage prefix: Initial diagnosis Histologic grading system: 3 grade system    Genetic Testing   [employer] CustomNext Panel was Negative. Report date is 07/24/2021.  The CustomNext panel offered by [employer] includes sequencing, rearrangement, and RNA analysis for the following 41 genes: APC, ATM, BAP1, BARD1, BMPR1A, BRCA1, BRCA2, BRIP1, CDH1, CDK4, CDKN2A, CHEK2, DICER1, MLH1, MSH2, MSH6, MUTYH, NBN, NF1, NTHL1, PALB2, PMS2, POT1, PTEN, RAD51C, RAD51D, RB1, RECQL, SMAD4, SMARCA4, STK11 and TP53 (sequencing and deletion/duplication); AXIN2, EGFR,  HOXB13, MITF, MSH3, POLD1 and POLE (sequencing only); EPCAM and GREM1 (deletion/duplication only).    09/14/2021 - 10/04/2021 Chemotherapy   Patient is on Treatment Plan : BREAST Trastuzumab q21d     10/25/2021 -  Chemotherapy   Patient is on Treatment Plan : BREAST MAINTENANCE Trastuzumab IV (6) or SQ (600) D1 q21d X 11 Cycles       CHIEF COMPLIANT: Follow-up herceptin  INTERVAL HISTORY: LYRIS HITCHMAN is a 87 y.o. female is here because of recent diagnosis of left breast. She presents to the clinic today for a follow-up.   ALLERGIES:  is allergic to flagyl [metronidazole].  MEDICATIONS:  Current Outpatient Medications  Medication Sig Dispense Refill   carbidopa-levodopa (SINEMET IR) 25-100 MG tablet Take 2 tablets by mouth 3 (three) times daily. 540 tablet 2   cholecalciferol (VITAMIN D3) 25 MCG (1000 UNIT) tablet Take 2,000 Units by mouth daily.     Coenzyme Q10 (COQ-10) 100 MG CAPS Take 1 tablet by mouth daily.     letrozole (FEMARA) 2.5 MG tablet Take 1 tablet (2.5 mg total) by mouth daily. 90 tablet 3   mirtazapine (REMERON) 7.5 MG tablet TAKE 1 TABLET (7.5 MG TOTAL) BY MOUTH AT BEDTIME. 90 tablet 1   Multiple Vitamin (MULTIVITAMIN WITH MINERALS) TABS tablet Take 1 tablet by mouth daily.     Multiple Vitamins-Minerals (PRESERVISION AREDS 2 PO) Take 1 tablet by mouth in the morning and at bedtime.      NON FORMULARY Herblax as needed     NON FORMULARY Cholesterol Reduction     rosuvastatin (CRESTOR) 5 MG tablet TAKE 1 TABLET BY MOUTH AT BEDTIME 90 tablet 1   traMADol (ULTRAM) 50 MG tablet Take 1 tablet (50 mg total) by mouth every 6 (six) hours as needed for moderate pain. [place]  tablet 0   Vitamin E 100 units TABS Take 250 mg by mouth. Once a day     No current facility-administered medications for this visit.   Facility-Administered Medications Ordered in Other Visits  Medication Dose Route Frequency Provider Last Rate Last Admin   silver sulfADIAZINE (SILVADENE) 1 % cream    Topical BID Eppie Gibson, MD   Given at 11/07/21 1126    PHYSICAL EXAMINATION: ECOG PERFORMANCE STATUS: {CHL ONC ECOG PS:[id number]}  There were no vitals filed for this visit. There were no vitals filed for this visit.  BREAST:*** No palpable masses or nodules in either right or left breasts. No palpable axillary supraclavicular or infraclavicular adenopathy no breast tenderness or nipple discharge. (exam performed in the presence of a chaperone)  LABORATORY DATA:  I have reviewed the data as listed    Latest Ref Rng & Units 09/12/2021    2:21 PM 08/03/2021    8:55 AM 07/13/2021    8:31 AM  CMP  Glucose 70 - 99 mg/dL  113  126   BUN 8 - 27 mg/dL  22  21   Creatinine 0.44 - 1.00 mg/dL 0.70  0.75  0.86   Sodium 134 - 144 mmol/L  141  140   Potassium 3.5 - 5.2 mmol/L  4.4  4.0   Chloride 96 - 106 mmol/L  106  107   CO2 20 - 29 mmol/L  22  28   Calcium 8.7 - 10.3 mg/dL  10.1  11.0   Total Protein 6.0 - 8.5 g/dL  6.6  7.3   Total Bilirubin 0.0 - 1.2 mg/dL  0.5  0.6   Alkaline Phos 44 - 121 IU/L  83  81   AST 0 - 40 IU/L  21  18   ALT 0 - 32 IU/L  8  18     Lab Results  Component Value Date   WBC 6.9 08/03/2021   HGB 13.4 08/03/2021   HCT 39.9 08/03/2021   MCV 92 08/03/2021   PLT 202 08/03/2021   NEUTROABS 4.5 07/13/2021    ASSESSMENT & PLAN:  No problem-specific Assessment & Plan notes found for this encounter.    No orders of the defined types were placed in this encounter.  The patient has a good understanding of the overall plan. she agrees with it. she will call with any problems that may develop before the next visit here. Total time spent: 30 mins including face to face time and time spent for planning, charting and co-ordination of care   Suzzette Righter, [place] 11/12/21    I Gardiner Coins am scribing for Dr. Lindi Adie  ***

## 2021-11-14 ENCOUNTER — Ambulatory Visit: Payer: Medicare PPO | Attending: Surgery

## 2021-11-14 VITALS — Wt 163.0 lb

## 2021-11-14 DIAGNOSIS — Z483 Aftercare following surgery for neoplasm: Secondary | ICD-10-CM | POA: Insufficient documentation

## 2021-11-14 NOTE — Therapy (Signed)
OUTPATIENT PHYSICAL THERAPY SOZO SCREENING NOTE   Patient Name: Jamie [name] MRN: [id number] DOB:09-25-1934, 87 y.o., female Today's Date: 11/14/2021  PCP: Lorrene Reid, PA-C REFERRING PROVIDER: Coralie Keens, MD   PT End of Session - 11/14/21 [phone]     Visit Number 3   # unchanged due to screen only   PT Start Time 0901    PT Stop Time 0907    PT Time Calculation (min) 6 min    Activity Tolerance Patient tolerated treatment well    Behavior During Therapy [facility] for tasks assessed/performed             Past Medical History:  Diagnosis Date   Abnormality of gait 04/18/2017   Degenerative joint disease of knee, right 03/19/2018   Injected April 18, 2018 Approved for Durolane R knee 3.4.20. Patient given another steroid injection Jun 24, 2018.  Repeat injection September 09, 2018 repeat injection February 27, 2019 repeat 6/110/20221 bilateral injections given December 01, 2019 Right knee injected April 20, 2020 monovisc May 19, 2018 Repeat steroid injection given June 16, 2020 repeat injection August 24, 2020   Essential hypertension 08/14/2016   Generalized anxiety disorder 06/27/2017   History of dizziness 06/27/2017   History of hysterectomy for benign disease 06/27/2017   History of vitamin D deficiency 08/22/2017   Leukocytes in urine 08/14/2016   Lumbar facet arthropathy 06/27/2017   Major neurocognitive disorder due to multiple etiologies 09/29/2020   Mixed hyperlipidemia 08/22/2017   Osteoarthritis of knee 09/18/2017   Pancolitis 08/14/2016   Parkinson's disease 03/13/2017   Prediabetes 08/22/2017   Right knee pain    Spinal stenosis of lumbar region    Weakness of both hips 04/18/2017   White coat syndrome without diagnosis of hypertension 08/22/2017   Past Surgical History:  Procedure Laterality Date   HYSTERECTOMY ABDOMINAL WITH SALPINGECTOMY     MASTECTOMY W/ SENTINEL NODE BIOPSY Left 08/18/2021   Procedure: LEFT MASTECTOMY WITH TARGETED LYMPH NODE  DISSECTION;  Surgeon: Coralie Keens, MD;  Location: [facility];  Service: General;  Laterality: Left;   TONSILLECTOMY     Patient Active Problem List   Diagnosis Date Noted   S/P left mastectomy 08/18/2021   Genetic testing 07/25/2021   Family history of breast cancer 07/14/2021   Malignant neoplasm of lower-inner quadrant of left breast in female, estrogen receptor positive ([place]) 07/11/2021   Mild episode of recurrent major depressive disorder ([place]) 04/06/2021   Age-related osteoporosis without current pathological fracture 04/06/2021   Greater trochanteric bursitis of right hip 01/21/2021   Major neurocognitive disorder due to multiple etiologies 09/29/2020   Right knee pain 05/07/2018   Degenerative joint disease of knee, right 03/19/2018   Osteoarthritis of knee 09/18/2017   Mixed hyperlipidemia 08/22/2017   Prediabetes 08/22/2017   White coat syndrome without diagnosis of hypertension 08/22/2017   History of vitamin D deficiency 08/22/2017   Lumbar facet arthropathy 06/27/2017   History of hysterectomy for benign disease 06/27/2017   Generalized anxiety disorder 06/27/2017   History of dizziness 06/27/2017   Other fatigue 06/27/2017   Overweight (BMI 25.0-29.9) 06/27/2017   Weakness of both hips 04/18/2017   Abnormality of gait 04/18/2017   Spinal stenosis of lumbar region 04/18/2017   Parkinson's disease 03/13/2017   Pancolitis 08/14/2016   Essential hypertension 08/14/2016   Leukocytes in urine 08/14/2016    REFERRING DIAG: left breast cancer at risk for lymphedema  THERAPY DIAG: Aftercare following surgery for neoplasm  PERTINENT HISTORY: Patient was diagnosed  on 06/27/2021 with left grade 2 invasive ductal carcinoma breast cancer. She underwent a left mastectomy and targeted axillary lymph node dissection (7/7 nodes positive) on 08/18/2021. It is triple positive with a Ki67 of 40%. Patient has cognitive deficits effecting memory and was diagnosed with  Parkinson's in 2020. She is triple positive with a Ki67 of 40%  PRECAUTIONS: left UE Lymphedema risk, None  SUBJECTIVE: Pt returns for her first 3 month L-Dex screen.   PAIN:  Are you having pain? No  SOZO SCREENING: Patient was assessed today using the SOZO machine to determine the lymphedema index score. This was compared to her baseline score. It was determined that she is within the recommended range when compared to her baseline and no further action is needed at this time. She will continue SOZO screenings. These are done every 3 months for 2 years post operatively followed by every 6 months for 2 years, and then annually.   L-DEX FLOWSHEETS - 11/14/21 0900       L-DEX LYMPHEDEMA SCREENING   Measurement Type Unilateral    L-DEX MEASUREMENT EXTREMITY Upper Extremity    POSITION  Standing    DOMINANT SIDE Right    At Risk Side Left    BASELINE SCORE (UNILATERAL) 0.1    L-DEX SCORE (UNILATERAL) 3    VALUE CHANGE (UNILAT) 2.9               Otelia Limes, PTA 11/14/2021, 9:08 AM

## 2021-11-15 ENCOUNTER — Inpatient Hospital Stay: Payer: Medicare PPO

## 2021-11-15 ENCOUNTER — Other Ambulatory Visit: Payer: Self-pay

## 2021-11-15 ENCOUNTER — Inpatient Hospital Stay: Payer: Medicare PPO | Admitting: Hematology and Oncology

## 2021-11-15 DIAGNOSIS — Z170 Estrogen receptor positive status [ER+]: Secondary | ICD-10-CM | POA: Insufficient documentation

## 2021-11-15 DIAGNOSIS — C50312 Malignant neoplasm of lower-inner quadrant of left female breast: Secondary | ICD-10-CM

## 2021-11-15 DIAGNOSIS — Z5111 Encounter for antineoplastic chemotherapy: Secondary | ICD-10-CM | POA: Diagnosis not present

## 2021-11-15 DIAGNOSIS — Z79811 Long term (current) use of aromatase inhibitors: Secondary | ICD-10-CM | POA: Diagnosis not present

## 2021-11-15 DIAGNOSIS — Z9012 Acquired absence of left breast and nipple: Secondary | ICD-10-CM | POA: Insufficient documentation

## 2021-11-15 DIAGNOSIS — L598 Other specified disorders of the skin and subcutaneous tissue related to radiation: Secondary | ICD-10-CM | POA: Insufficient documentation

## 2021-11-15 DIAGNOSIS — Z9221 Personal history of antineoplastic chemotherapy: Secondary | ICD-10-CM | POA: Diagnosis not present

## 2021-11-15 MED ORDER — DIPHENHYDRAMINE HCL 25 MG PO CAPS
25.0000 mg | ORAL_CAPSULE | Freq: Once | ORAL | Status: AC
  Administered 2021-11-15: 25 mg via ORAL
  Filled 2021-11-15: qty 1

## 2021-11-15 MED ORDER — TRASTUZUMAB-HYALURONIDASE-OYSK 600-10000 MG-UNT/5ML ~~LOC~~ SOLN
600.0000 mg | Freq: Once | SUBCUTANEOUS | Status: AC
  Administered 2021-11-15: 600 mg via SUBCUTANEOUS
  Filled 2021-11-15: qty 5

## 2021-11-15 NOTE — Patient Instructions (Signed)
[facility]  Discharge Instructions: Thank you for choosing [facility] to provide your oncology and hematology care.   If you have a lab appointment with the [facility], please go directly to the [facility] and check in at the registration area.   Wear comfortable clothing and clothing appropriate for easy access to any Portacath or PICC line.   We strive to give you quality time with your provider. You may need to reschedule your appointment if you arrive late (15 or more minutes).  Arriving late affects you and other patients whose appointments are after yours.  Also, if you miss three or more appointments without notifying the office, you may be dismissed from the clinic at the provider's discretion.      For prescription refill requests, have your pharmacy contact our office and allow 72 hours for refills to be completed.    Today you received the following chemotherapy and/or immunotherapy agents Herceptin Hylecta     To help prevent nausea and vomiting after your treatment, we encourage you to take your nausea medication as directed.  BELOW ARE SYMPTOMS THAT SHOULD BE REPORTED IMMEDIATELY: *FEVER GREATER THAN 100.4 F (38 C) OR HIGHER *CHILLS OR SWEATING *NAUSEA AND VOMITING THAT IS NOT CONTROLLED WITH YOUR NAUSEA MEDICATION *UNUSUAL SHORTNESS OF BREATH *UNUSUAL BRUISING OR BLEEDING *URINARY PROBLEMS (pain or burning when urinating, or frequent urination) *BOWEL PROBLEMS (unusual diarrhea, constipation, pain near the anus) TENDERNESS IN MOUTH AND THROAT WITH OR WITHOUT PRESENCE OF ULCERS (sore throat, sores in mouth, or a toothache) UNUSUAL RASH, SWELLING OR PAIN  UNUSUAL VAGINAL DISCHARGE OR ITCHING   Items with * indicate a potential emergency and should be followed up as soon as possible or go to the Emergency Department if any problems should occur.  Please show the CHEMOTHERAPY ALERT CARD or IMMUNOTHERAPY ALERT CARD at  check-in to the Emergency Department and triage nurse.  Should you have questions after your visit or need to cancel or reschedule your appointment, please contact [facility]  Dept: [phone]  and follow the prompts.  Office hours are 8:00 a.m. to 4:30 p.m. Monday - Friday. Please note that voicemails left after 4:00 p.m. may not be returned until the following business day.  We are closed weekends and major holidays. You have access to a nurse at all times for urgent questions. Please call the main number to the clinic Dept: [phone] and follow the prompts.   For any non-urgent questions, you may also contact your provider using MyChart. We now offer e-Visits for anyone 39 and older to request care online for non-urgent symptoms. For details visit [email or web address].   Also download the MyChart app! Go to the app store, search "MyChart", open the app, select [facility], and log in with your MyChart username and password.  Masks are optional in the cancer centers. If you would like for your care team to wear a mask while they are taking care of you, please let them know. For doctor visits, patients may have with them one support person who is at least 85 years old. At this time, visitors are not allowed in the infusion area.   Trastuzumab; Hyaluronidase injection What is this medication? TRASTUZUMAB; HYALURONIDASE (tras TOO zoo mab / hye al ur ON i dase) is used to treat breast cancer and stomach cancer. Trastuzumab is a monoclonal antibody. Hyaluronidase is used to improve the effects of trastuzumab. This medicine may be used for  other purposes; ask your health care provider or pharmacist if you have questions. COMMON BRAND NAME(S): HERCEPTIN HYLECTA What should I tell my care team before I take this medication? They need to know if you have any of these conditions: heart disease heart failure lung or breathing disease, like asthma an unusual or  allergic reaction to trastuzumab, or other medications, foods, dyes, or preservatives pregnant or trying to get pregnant breast-feeding How should I use this medication? This medicine is for injection under the skin. It is given by a health care professional in a hospital or clinic setting. Talk to your pediatrician regarding the use of this medicine in children. This medicine is not approved for use in children. Overdosage: If you think you have taken too much of this medicine contact a poison control center or emergency room at once. NOTE: This medicine is only for you. Do not share this medicine with others. What if I miss a dose? It is important not to miss a dose. Call your doctor or health care professional if you are unable to keep an appointment. What may interact with this medication? This medicine may interact with the following medications: certain types of chemotherapy, such as daunorubicin, doxorubicin, epirubicin, and idarubicin This list may not describe all possible interactions. Give your health care provider a list of all the medicines, herbs, non-prescription drugs, or dietary supplements you use. Also tell them if you smoke, drink alcohol, or use illegal drugs. Some items may interact with your medicine. What should I watch for while using this medication? Visit your doctor for checks on your progress. Report any side effects. Continue your course of treatment even though you feel ill unless your doctor tells you to stop. Call your doctor or health care professional for advice if you get a fever, chills or sore throat, or other symptoms of a cold or flu. Do not treat yourself. Try to avoid being around people who are sick. You may experience fever, chills and shaking during your first infusion. These effects are usually mild and can be treated with other medicines. Report any side effects during the infusion to your health care professional. Fever and chills usually do not happen  with later infusions. Do not become pregnant while taking this medicine or for 7 months after stopping it. Women should inform their doctor if they wish to become pregnant or think they might be pregnant. Women of child-bearing potential will need to have a negative pregnancy test before starting this medicine. There is a potential for serious side effects to an unborn child. Talk to your health care professional or pharmacist for more information. Do not breast-feed an infant while taking this medicine or for 7 months after stopping it. What side effects may I notice from receiving this medication? Side effects that you should report to your doctor or health care professional as soon as possible: allergic reactions like skin rash, itching or hives, swelling of the face, lips, or tongue breathing problems chest pain or palpitations cough fever general ill feeling or flu-like symptoms signs of worsening heart failure like breathing problems; swelling in your legs and feet Side effects that usually do not require medical attention (report these to your doctor or health care professional if they continue or are bothersome): bone pain changes in taste diarrhea joint pain nausea/vomiting unusually weak or tired weight loss This list may not describe all possible side effects. Call your doctor for medical advice about side effects. You may report side effects  to FDA at 1-800-FDA-1088. Where should I keep my medication? This drug is given in a hospital or clinic and will not be stored at home. NOTE: This sheet is a summary. It may not cover all possible information. If you have questions about this medicine, talk to your doctor, pharmacist, or health care provider.  2023 Elsevier/Gold Standard (2017-06-25 00:00:00)

## 2021-11-15 NOTE — Assessment & Plan Note (Addendum)
07/09/2021:Skin changes in the left breast, screening mammogram revealed thickening of the lower medial quadrant along with enlarged lymph node in the axilla (2 additional lymph nodes) by ultrasound breast tumor measured 3.9 cm, biopsy: Grade 2 IDC with lymphovascular invasion ER 100%, PR 100%, HER2 positive Ki-67 40%, lymph node biopsy positive  08/18/21: Left Mastectomy: Grade 3 IDC with micropapillary features with DCIS 5.8 cm 7/7 LN Positives, involves dermal stroma and lymphatics with focal Extra nodal invasion  09/12/2021: Bone scan focal radiotracer uptake right lateral femoral condyle (knee x-ray recommended) 09/12/2021: CT CAP: Several small left axillary lymph nodes. No evidence of distant metastatic disease. 2 cm right thyroid nodule ------------------------------------------------------------------------------------------------------------------------ Treatment Plan: 1.adjuvant Herceptin with aromatase inhibitorsstarted 09/14/2021 2.Adjuvant radiation completed 11/09/2021  Adverse effects: Denies any adverse effects to either Herceptin or letrozole.  Radiation dermatitis: We will anticipate that it will go away in the next month or so.  Return to clinic every 3 weeks for Herceptin and every 6 weeks for follow-up with me.

## 2021-11-16 ENCOUNTER — Ambulatory Visit: Payer: Medicare PPO | Admitting: Physician Assistant

## 2021-11-20 ENCOUNTER — Encounter: Payer: Self-pay | Admitting: Hematology and Oncology

## 2021-11-22 ENCOUNTER — Encounter: Payer: Self-pay | Admitting: Physician Assistant

## 2021-11-22 ENCOUNTER — Ambulatory Visit (INDEPENDENT_AMBULATORY_CARE_PROVIDER_SITE_OTHER): Payer: Medicare PPO | Admitting: Physician Assistant

## 2021-11-22 VITALS — BP 123/77 | HR 78 | Ht 68.0 in | Wt 172.4 lb

## 2021-11-22 DIAGNOSIS — F330 Major depressive disorder, recurrent, mild: Secondary | ICD-10-CM | POA: Diagnosis not present

## 2021-11-22 DIAGNOSIS — I10 Essential (primary) hypertension: Secondary | ICD-10-CM | POA: Diagnosis not present

## 2021-11-22 DIAGNOSIS — C50312 Malignant neoplasm of lower-inner quadrant of left female breast: Secondary | ICD-10-CM

## 2021-11-22 DIAGNOSIS — Z170 Estrogen receptor positive status [ER+]: Secondary | ICD-10-CM

## 2021-11-22 DIAGNOSIS — E782 Mixed hyperlipidemia: Secondary | ICD-10-CM | POA: Diagnosis not present

## 2021-11-22 DIAGNOSIS — R7989 Other specified abnormal findings of blood chemistry: Secondary | ICD-10-CM

## 2021-11-22 NOTE — Assessment & Plan Note (Signed)
-  Controlled. PHQ-9 score of 1 and GAD-7 score of 0. Continue current medication regimen, no med changes today.

## 2021-11-22 NOTE — Assessment & Plan Note (Signed)
-  Diet controlled. Will continue to monitor.

## 2021-11-22 NOTE — Assessment & Plan Note (Signed)
-  Last lipid panel normal, LDL 91. Continue rosuvastatin 5 mg daily.

## 2021-11-22 NOTE — Progress Notes (Signed)
Established patient visit   Patient: Jamie [name]   DOB: Aug 15, 1934   87 y.o. Female  MRN: [id number] Visit Date: 11/22/2021  Chief Complaint  Patient presents with   Follow-up   Subjective    HPI  Patient presenting for chronic follow-up visit. Patient has completed radiation therapy and currently on Letrozole. Reports has experienced some joint pain with medication.   Mood: Patient reports mood has been stable. Taking mirtazapine 7.5 mg as directed. Patient has a good support system at home.  HTN: Pt denies chest pain, palpitations, dizziness or leg swelling. Pt follows a low salt diet.  HLD: Pt taking medication as directed without problems. Diet fluctuates.      11/22/2021   11:17 AM 08/16/2021    2:28 PM 06/07/2021    4:16 PM 04/06/2021   10:34 AM 11/30/2020   10:03 AM  Depression screen PHQ 2/9  Decreased Interest 0 0 0 0 0  Down, Depressed, Hopeless 0 0 0 0 0  PHQ - 2 Score 0 0 0 0 0  Altered sleeping 0 1 0 0 0  Tired, decreased energy 0 1 0 0 1  Change in appetite 1 0 0 0 1  Feeling bad or failure about yourself  0 0 0 1 0  Trouble concentrating 0 0 0 0 0  Moving slowly or fidgety/restless 0 0 0 0 0  Suicidal thoughts 0 0 0 0 0  PHQ-9 Score 1 2 0 1 2  Difficult doing work/chores  Not difficult at all Not difficult at all Not difficult at all       11/22/2021   11:17 AM 08/16/2021    2:29 PM 06/07/2021    4:17 PM 11/30/2020   10:03 AM  GAD 7 : Generalized Anxiety Score  Nervous, Anxious, on Edge 0 0 0 0  Control/stop worrying 0 0 0 0  Worry too much - different things 0 1 0 0  Trouble relaxing 0 0 0 0  Restless 0 0 0 0  Easily annoyed or irritable 0 0 0 0  Afraid - awful might happen 0 0 0 0  Total GAD 7 Score 0 1 0 0  Anxiety Difficulty  Not difficult at all Not difficult at all     Medications: Outpatient Medications Prior to Visit  Medication Sig   carbidopa-levodopa (SINEMET IR) 25-100 MG tablet Take 2 tablets by mouth 3 (three) times daily.    cholecalciferol (VITAMIN D3) 25 MCG (1000 UNIT) tablet Take 2,000 Units by mouth daily.   Coenzyme Q10 (COQ-10) 100 MG CAPS Take 1 tablet by mouth daily.   letrozole (FEMARA) 2.5 MG tablet Take 1 tablet (2.5 mg total) by mouth daily.   mirtazapine (REMERON) 7.5 MG tablet TAKE 1 TABLET (7.5 MG TOTAL) BY MOUTH AT BEDTIME.   Multiple Vitamin (MULTIVITAMIN WITH MINERALS) TABS tablet Take 1 tablet by mouth daily.   Multiple Vitamins-Minerals (PRESERVISION AREDS 2 PO) Take 1 tablet by mouth in the morning and at bedtime.    NON FORMULARY Herblax as needed   NON FORMULARY Cholesterol Reduction   rosuvastatin (CRESTOR) 5 MG tablet TAKE 1 TABLET BY MOUTH AT BEDTIME   Vitamin E 100 units TABS Take 250 mg by mouth. Once a day   [DISCONTINUED] Pediatric Multiple Vitamins (FLINTSTONES PLUS EXTRA C) CHEW Chew by mouth.   [DISCONTINUED] traMADol (ULTRAM) 50 MG tablet Take 1 tablet (50 mg total) by mouth every 6 (six) hours as needed for moderate pain.   [DISCONTINUED] traMADol Veatrice Bourbon)  50 MG tablet Take 1 tablet by mouth every 6 (six) hours as needed.   No facility-administered medications prior to visit.    Review of Systems Review of Systems:  A fourteen system review of systems was performed and found to be positive as per HPI.  Last CBC Lab Results  Component Value Date   WBC 6.9 08/03/2021   HGB 13.4 08/03/2021   HCT 39.9 08/03/2021   MCV 92 08/03/2021   MCH 30.7 08/03/2021   RDW 12.7 08/03/2021   PLT 202 08/03/2021   Last metabolic panel Lab Results  Component Value Date   GLUCOSE 113 (H) 08/03/2021   NA 141 08/03/2021   K 4.4 08/03/2021   CL 106 08/03/2021   CO2 22 08/03/2021   BUN 22 08/03/2021   CREATININE 0.70 09/12/2021   EGFR 78 08/03/2021   CALCIUM 10.1 08/03/2021   PROT 6.6 08/03/2021   ALBUMIN 4.5 08/03/2021   LABGLOB 2.1 08/03/2021   AGRATIO 2.1 08/03/2021   BILITOT 0.5 08/03/2021   ALKPHOS 83 08/03/2021   AST 21 08/03/2021   ALT 8 08/03/2021   ANIONGAP 5  07/13/2021   Last lipids Lab Results  Component Value Date   CHOL 183 08/03/2021   HDL 68 08/03/2021   LDLCALC 91 08/03/2021   LDLDIRECT 95 10/01/2019   TRIG 139 08/03/2021   CHOLHDL 2.7 08/03/2021   Last hemoglobin A1c Lab Results  Component Value Date   HGBA1C 6.3 (H) 08/03/2021   Last thyroid functions Lab Results  Component Value Date   TSH 5.020 (H) 09/27/2021   T3TOTAL 84 12/09/2018   Last vitamin D Lab Results  Component Value Date   VD25OH 58.0 12/09/2018       Objective    BP 123/77   Pulse 78   Ht 5\' 8"  (1.727 m)   Wt 172 lb 6.4 oz (78.2 kg)   SpO2 97%   BMI 26.21 kg/m  BP Readings from Last 3 Encounters:  11/22/21 123/77  11/15/21 (!) 141/59  10/25/21 (!) 141/70   Wt Readings from Last 3 Encounters:  11/22/21 172 lb 6.4 oz (78.2 kg)  11/15/21 172 lb 4.8 oz (78.2 kg)  11/14/21 163 lb (73.9 kg)    Physical Exam  General:  Pleasant and cooperative, in no acute distress, appropriate for stated age.  Neuro:  Alert and oriented,  extra-ocular muscles intact  HEENT:  Normocephalic, atraumatic, neck supple  Skin:  no gross rash, warm, pink. Cardiac:  RRR, S1 S2 Respiratory: CTA B/L  Vascular:  Ext warm, no cyanosis apprec.; cap RF less 2 sec. Psych:  No HI/SI, judgement and insight good, Euthymic mood. Full Affect.   No results found for any visits on 11/22/21.  Assessment & Plan      Problem List Items Addressed This Visit       Cardiovascular and Mediastinum   Essential hypertension (Chronic)    -Diet controlled. Will continue to monitor.        Other   Mixed hyperlipidemia    -Last lipid panel normal, LDL 91. Continue rosuvastatin 5 mg daily.       Mild episode of recurrent major depressive disorder (HCC) - Primary    -Controlled. PHQ-9 score of 1 and GAD-7 score of 0. Continue current medication regimen, no med changes today.      Malignant neoplasm of lower-inner quadrant of left breast in female, estrogen receptor positive  (HCC)   Other Visit Diagnoses     Elevated TSH  Relevant Orders   TSH   T4, free   T3      Malignant neoplasm of lower-inner quadrant of left breast in female, estrogen receptor positive:  -Followed by oncology. Recommend discussing side effects myalgia and arthralgia at upcoming follow-up visit.  Elevated TSH: -Last TSH mildly elevated and free T4 normal, will repeat thyroid labs today.   Return for Mood, HTN, HLD in 4-6 months.        Lorrene Reid, PA-C  [facility] [phone] (phone) [phone] (fax)  [place]

## 2021-11-23 ENCOUNTER — Encounter: Payer: Self-pay | Admitting: Hematology and Oncology

## 2021-11-23 LAB — T3: T3, Total: 82 ng/dL (ref 71–180)

## 2021-11-23 LAB — TSH: TSH: 4.95 u[IU]/mL — ABNORMAL HIGH (ref 0.450–4.500)

## 2021-11-23 LAB — T4, FREE: Free T4: 1.09 ng/dL (ref 0.82–1.77)

## 2021-11-23 NOTE — Progress Notes (Unsigned)
[place] Phone: [phone] Subjective:   Jamie [name], am serving as a scribe for Dr. Hulan Saas.  I'm seeing this patient by the request  of:  Lorrene Reid, PA-C  CC: Right knee pain  NOB:[id number]  09/15/2021 Repeat injection given today and tolerated the procedure well.  We discussed with patient about the steroid and the Herceptin use.  Did not see any significant contraindications at this time.  Discussed can repeat this injection every 10 weeks if necessary.  Increase activity slowly otherwise.  Follow-up with me again in 10 weeks for this chronic problem that is worsening  Updated 11/24/2021 Jamie [name] is a 87 y.o. female coming in with complaint of R knee pain. Patient states that her pain is worse with sit to stand. Pain is constant.  It can affect her daily activities.  Sometimes feels like she has some instability.       Past Medical History:  Diagnosis Date   Abnormality of gait 04/18/2017   Degenerative joint disease of knee, right 03/19/2018   Injected April 18, 2018 Approved for Durolane R knee 3.4.20. Patient given another steroid injection Jun 24, 2018.  Repeat injection September 09, 2018 repeat injection February 27, 2019 repeat 6/110/20221 bilateral injections given December 01, 2019 Right knee injected April 20, 2020 monovisc May 19, 2018 Repeat steroid injection given June 16, 2020 repeat injection August 24, 2020   Essential hypertension 08/14/2016   Generalized anxiety disorder 06/27/2017   History of dizziness 06/27/2017   History of hysterectomy for benign disease 06/27/2017   History of vitamin D deficiency 08/22/2017   Leukocytes in urine 08/14/2016   Lumbar facet arthropathy 06/27/2017   Major neurocognitive disorder due to multiple etiologies 09/29/2020   Mixed hyperlipidemia 08/22/2017   Osteoarthritis of knee 09/18/2017   Pancolitis 08/14/2016   Parkinson's disease  03/13/2017   Prediabetes 08/22/2017   Right knee pain    Spinal stenosis of lumbar region    Weakness of both hips 04/18/2017   White coat syndrome without diagnosis of hypertension 08/22/2017   Past Surgical History:  Procedure Laterality Date   HYSTERECTOMY ABDOMINAL WITH SALPINGECTOMY     MASTECTOMY W/ SENTINEL NODE BIOPSY Left 08/18/2021   Procedure: LEFT MASTECTOMY WITH TARGETED LYMPH NODE DISSECTION;  Surgeon: Coralie Keens, MD;  Location: [facility];  Service: General;  Laterality: Left;   TONSILLECTOMY     Social History   Socioeconomic History   Marital status: Widowed    Spouse name: Not on file   Number of children: 3   Years of education: 12   Highest education level: High school graduate  Occupational History   Not on file  Tobacco Use   Smoking status: Never   Smokeless tobacco: Never  Vaping Use   Vaping Use: Never used  Substance and Sexual Activity   Alcohol use: [name]    Alcohol/week: 0.0 standard drinks of alcohol   Drug use: [name]   Sexual activity: Not Currently    Birth control/protection: Surgical  Other Topics Concern   Not on file  Social History Narrative   Pt lives alone in her 1 story home- she has 3 sons, and a high school graduate. She is right handed, she drinks coffee, and one soda a week mostly water   Social Determinants of Health   Financial Resource Strain: Low Risk  (07/13/2021)   Overall Financial Resource Strain (CARDIA)    Difficulty  of Paying Living Expenses: Not hard at all  Food Insecurity: [name] Food Insecurity (07/13/2021)   Hunger Vital Sign    Worried About Running Out of Food in the Last Year: Never true    Ran Out of Food in the Last Year: Never true  Transportation Needs: [name] Transportation Needs (07/13/2021)   PRAPARE - Hydrologist (Medical): [name]    Lack of Transportation (Non-Medical): [name]  Physical Activity: Not on file  Stress: [name] Stress Concern Present (08/16/2021)   [place]    Feeling of Stress : Not at all  Social Connections: Moderately Integrated (08/16/2021)   Social Connection and Isolation Panel [NHANES]    Frequency of Communication with Friends and Family: More than three times a week    Frequency of Social Gatherings with Friends and Family: More than three times a week    Attends Religious Services: More than 4 times per year    Active Member of [employer] or Organizations: Yes    Attends Archivist Meetings: More than 4 times per year    Marital Status: Widowed   Allergies  Allergen Reactions   Flagyl [Metronidazole] Rash    Possible cause of rash    Family History  Problem Relation Age of Onset   Breast cancer Mother 60   Melanoma Father        dx. 83s   Lung cancer Father        dx. 2s   Healthy Son      Current Outpatient Medications (Cardiovascular):    rosuvastatin (CRESTOR) 5 MG tablet, TAKE 1 TABLET BY MOUTH AT BEDTIME     Current Outpatient Medications (Other):    carbidopa-levodopa (SINEMET IR) 25-100 MG tablet, Take 2 tablets by mouth 3 (three) times daily.   cholecalciferol (VITAMIN D3) 25 MCG (1000 UNIT) tablet, Take 2,000 Units by mouth daily.   Coenzyme Q10 (COQ-10) 100 MG CAPS, Take 1 tablet by mouth daily.   letrozole (FEMARA) 2.5 MG tablet, Take 1 tablet (2.5 mg total) by mouth daily.   mirtazapine (REMERON) 7.5 MG tablet, TAKE 1 TABLET (7.5 MG TOTAL) BY MOUTH AT BEDTIME.   Multiple Vitamin (MULTIVITAMIN WITH MINERALS) TABS tablet, Take 1 tablet by mouth daily.   Multiple Vitamins-Minerals (PRESERVISION AREDS 2 PO), Take 1 tablet by mouth in the morning and at bedtime.    NON FORMULARY, Herblax as needed   NON FORMULARY, Cholesterol Reduction   Vitamin E 100 units TABS, Take 250 mg by mouth. Once a day   Reviewed prior external information including notes and imaging from  primary care provider As well as notes that were available from  care everywhere and other healthcare systems.  Past medical history, social, surgical and family history all reviewed in electronic medical record.  [name] pertanent information unless stated regarding to the chief complaint.   Review of Systems:  [name] headache, visual changes, nausea, vomiting, diarrhea, constipation, dizziness, abdominal pain, skin rash, fevers, chills, night sweats, weight loss, swollen lymph nodes, chest pain, shortness of breath, mood changes. POSITIVE muscle aches, body aches, joint swelling  Objective  Blood pressure 118/78, pulse 79, height '5\' 8"'$  (1.727 m), weight 174 lb (78.9 kg), SpO2 98 %.   General: [name] apparent distress alert and oriented x3 mood and affect normal, dressed appropriately.  HEENT: Pupils equal, extraocular movements intact  Respiratory: Patient's speak in full sentences and does not appear short of breath  Cardiovascular:  [name] lower extremity edema, non tender, [name] erythema  Patient does have a shuffling gait noted.  Masked facies noted.  Right knee does have a large effusion noted.  Patient has had limited range of motion flexion and extension with instability with valgus and varus force  After informed written and verbal consent, patient was seated on exam table. Right knee was prepped with alcohol swab and utilizing anterolateral approach, patient's right knee space was injected with 4:1  marcaine 0.5%: Kenalog '40mg'$ /dL. Patient tolerated the procedure well without immediate complications.    Impression and Recommendations:    The above documentation has been reviewed and is accurate and complete Lyndal Pulley, DO

## 2021-11-23 NOTE — Progress Notes (Signed)
                                                                                                                                                             Patient Name: Jamie [name] MRN: [id number] DOB: Jul 01, 1934 Referring Physician: Lorrene Reid Date of Service: 11/11/2021 [facility], [place]                                                        End Of Treatment Note  Diagnoses: C50.312-Malignant neoplasm of lower-inner quadrant of left female breast  Cancer Staging:  Cancer Staging  Malignant neoplasm of lower-inner quadrant of left breast in female, estrogen receptor positive ([place]) Staging form: Breast, AJCC 8th Edition - Clinical: Stage IB (cT2, cN1, cM0, G2, ER+, PR+, HER2+) - Signed by Nicholas Lose, MD on 07/13/2021 Stage prefix: Initial diagnosis Histologic grading system: 3 grade system  Intent: Curative  Radiation Treatment Dates: 09/28/2021 through 11/11/2021 Site Technique Total Dose (Gy) Dose per Fx (Gy) Completed Fx Beam Energies  Chest Wall, Left: CW_L_IMN 3D 50/50 2 25/25 6X, 10X  Chest Wall, Left: CW_L_SCV_PAB 3D 50/50 2 25/25 6X, 10X  Chest Wall, Left: CW_L_Bst specialPort 10/10 2 5/5 6E, 9E   Narrative: The patient tolerated radiation therapy relatively well.   Plan: The patient will follow-up with radiation oncology in 74mo. -----------------------------------  Eppie Gibson, MD

## 2021-11-24 ENCOUNTER — Ambulatory Visit (INDEPENDENT_AMBULATORY_CARE_PROVIDER_SITE_OTHER): Payer: Medicare PPO | Admitting: Family Medicine

## 2021-11-24 ENCOUNTER — Encounter: Payer: Self-pay | Admitting: Family Medicine

## 2021-11-24 DIAGNOSIS — M1711 Unilateral primary osteoarthritis, right knee: Secondary | ICD-10-CM

## 2021-11-24 NOTE — Patient Instructions (Signed)
Good to see you! Injection in right knee

## 2021-11-24 NOTE — Assessment & Plan Note (Signed)
Continues to have difficulty with the knee.  Discussed with patient again secondary to age and comorbidities likely not a surgical candidate and patient wants to avoid completely as well.  This is a chronic problem that will continue to worsen.  Can repeat injection potentially in 8 weeks but would like to further evaluate in 8 to 10 weeks before we decide on that.

## 2021-11-29 ENCOUNTER — Telehealth: Payer: Self-pay | Admitting: *Deleted

## 2021-11-29 NOTE — Telephone Encounter (Signed)
Spoke with [name] per recommendations. She will update up on Tuesday and will take [facility] into the ED if symptoms worsen.

## 2021-11-29 NOTE — Telephone Encounter (Signed)
Pt's granddaughter called stating that pt was seen last week & received an injection but the relief did not last more than a day. She stated that the pt is now in as much pain if not more & would like to know what to do next.

## 2021-11-29 NOTE — Telephone Encounter (Signed)
We could see her sooner but would let it work for 2 weeks and then if not we need to maybe drain the knee

## 2021-12-06 ENCOUNTER — Inpatient Hospital Stay: Payer: Medicare PPO | Attending: Hematology and Oncology

## 2021-12-06 ENCOUNTER — Other Ambulatory Visit: Payer: Self-pay

## 2021-12-06 VITALS — BP 134/86 | HR 81 | Temp 98.3°F | Resp 18 | Ht 68.0 in | Wt 171.2 lb

## 2021-12-06 DIAGNOSIS — C50312 Malignant neoplasm of lower-inner quadrant of left female breast: Secondary | ICD-10-CM | POA: Insufficient documentation

## 2021-12-06 DIAGNOSIS — Z79899 Other long term (current) drug therapy: Secondary | ICD-10-CM | POA: Diagnosis not present

## 2021-12-06 MED ORDER — DIPHENHYDRAMINE HCL 25 MG PO CAPS
25.0000 mg | ORAL_CAPSULE | Freq: Once | ORAL | Status: AC
  Administered 2021-12-06: 25 mg via ORAL
  Filled 2021-12-06: qty 1

## 2021-12-06 MED ORDER — TRASTUZUMAB-HYALURONIDASE-OYSK 600-10000 MG-UNT/5ML ~~LOC~~ SOLN
600.0000 mg | Freq: Once | SUBCUTANEOUS | Status: AC
  Administered 2021-12-06: 600 mg via SUBCUTANEOUS
  Filled 2021-12-06: qty 5

## 2021-12-06 NOTE — Patient Instructions (Signed)
[facility]  Discharge Instructions: Thank you for choosing [facility] to provide your oncology and hematology care.   If you have a lab appointment with the [facility], please go directly to the [facility] and check in at the registration area.   Wear comfortable clothing and clothing appropriate for easy access to any Portacath or PICC line.   We strive to give you quality time with your provider. You may need to reschedule your appointment if you arrive late (15 or more minutes).  Arriving late affects you and other patients whose appointments are after yours.  Also, if you miss three or more appointments without notifying the office, you may be dismissed from the clinic at the provider's discretion.      For prescription refill requests, have your pharmacy contact our office and allow 72 hours for refills to be completed.    Today you received the following chemotherapy and/or immunotherapy agents Herceptin Hylecta     To help prevent nausea and vomiting after your treatment, we encourage you to take your nausea medication as directed.  BELOW ARE SYMPTOMS THAT SHOULD BE REPORTED IMMEDIATELY: *FEVER GREATER THAN 100.4 F (38 C) OR HIGHER *CHILLS OR SWEATING *NAUSEA AND VOMITING THAT IS NOT CONTROLLED WITH YOUR NAUSEA MEDICATION *UNUSUAL SHORTNESS OF BREATH *UNUSUAL BRUISING OR BLEEDING *URINARY PROBLEMS (pain or burning when urinating, or frequent urination) *BOWEL PROBLEMS (unusual diarrhea, constipation, pain near the anus) TENDERNESS IN MOUTH AND THROAT WITH OR WITHOUT PRESENCE OF ULCERS (sore throat, sores in mouth, or a toothache) UNUSUAL RASH, SWELLING OR PAIN  UNUSUAL VAGINAL DISCHARGE OR ITCHING   Items with * indicate a potential emergency and should be followed up as soon as possible or go to the Emergency Department if any problems should occur.  Please show the CHEMOTHERAPY ALERT CARD or IMMUNOTHERAPY ALERT CARD at  check-in to the Emergency Department and triage nurse.  Should you have questions after your visit or need to cancel or reschedule your appointment, please contact [facility]  Dept: [phone]  and follow the prompts.  Office hours are 8:00 a.m. to 4:30 p.m. Monday - Friday. Please note that voicemails left after 4:00 p.m. may not be returned until the following business day.  We are closed weekends and major holidays. You have access to a nurse at all times for urgent questions. Please call the main number to the clinic Dept: [phone] and follow the prompts.   For any non-urgent questions, you may also contact your provider using MyChart. We now offer e-Visits for anyone 85 and older to request care online for non-urgent symptoms. For details visit [email or web address].   Also download the MyChart app! Go to the app store, search "MyChart", open the app, select [facility], and log in with your MyChart username and password.  Masks are optional in the cancer centers. If you would like for your care team to wear a mask while they are taking care of you, please let them know. For doctor visits, patients may have with them one support person who is at least 85 years old. At this time, visitors are not allowed in the infusion area.   Trastuzumab; Hyaluronidase injection What is this medication? TRASTUZUMAB; HYALURONIDASE (tras TOO zoo mab / hye al ur ON i dase) is used to treat breast cancer and stomach cancer. Trastuzumab is a monoclonal antibody. Hyaluronidase is used to improve the effects of trastuzumab. This medicine may be used for  other purposes; ask your health care provider or pharmacist if you have questions. COMMON BRAND NAME(S): HERCEPTIN HYLECTA What should I tell my care team before I take this medication? They need to know if you have any of these conditions: heart disease heart failure lung or breathing disease, like asthma an unusual or  allergic reaction to trastuzumab, or other medications, foods, dyes, or preservatives pregnant or trying to get pregnant breast-feeding How should I use this medication? This medicine is for injection under the skin. It is given by a health care professional in a hospital or clinic setting. Talk to your pediatrician regarding the use of this medicine in children. This medicine is not approved for use in children. Overdosage: If you think you have taken too much of this medicine contact a poison control center or emergency room at once. NOTE: This medicine is only for you. Do not share this medicine with others. What if I miss a dose? It is important not to miss a dose. Call your doctor or health care professional if you are unable to keep an appointment. What may interact with this medication? This medicine may interact with the following medications: certain types of chemotherapy, such as daunorubicin, doxorubicin, epirubicin, and idarubicin This list may not describe all possible interactions. Give your health care provider a list of all the medicines, herbs, non-prescription drugs, or dietary supplements you use. Also tell them if you smoke, drink alcohol, or use illegal drugs. Some items may interact with your medicine. What should I watch for while using this medication? Visit your doctor for checks on your progress. Report any side effects. Continue your course of treatment even though you feel ill unless your doctor tells you to stop. Call your doctor or health care professional for advice if you get a fever, chills or sore throat, or other symptoms of a cold or flu. Do not treat yourself. Try to avoid being around people who are sick. You may experience fever, chills and shaking during your first infusion. These effects are usually mild and can be treated with other medicines. Report any side effects during the infusion to your health care professional. Fever and chills usually do not happen  with later infusions. Do not become pregnant while taking this medicine or for 7 months after stopping it. Women should inform their doctor if they wish to become pregnant or think they might be pregnant. Women of child-bearing potential will need to have a negative pregnancy test before starting this medicine. There is a potential for serious side effects to an unborn child. Talk to your health care professional or pharmacist for more information. Do not breast-feed an infant while taking this medicine or for 7 months after stopping it. What side effects may I notice from receiving this medication? Side effects that you should report to your doctor or health care professional as soon as possible: allergic reactions like skin rash, itching or hives, swelling of the face, lips, or tongue breathing problems chest pain or palpitations cough fever general ill feeling or flu-like symptoms signs of worsening heart failure like breathing problems; swelling in your legs and feet Side effects that usually do not require medical attention (report these to your doctor or health care professional if they continue or are bothersome): bone pain changes in taste diarrhea joint pain nausea/vomiting unusually weak or tired weight loss This list may not describe all possible side effects. Call your doctor for medical advice about side effects. You may report side effects  to FDA at 1-800-FDA-1088. Where should I keep my medication? This drug is given in a hospital or clinic and will not be stored at home. NOTE: This sheet is a summary. It may not cover all possible information. If you have questions about this medicine, talk to your doctor, pharmacist, or health care provider.  2023 Elsevier/Gold Standard (2017-06-25 00:00:00)

## 2021-12-09 NOTE — Progress Notes (Unsigned)
[facility] [place] Phone: [phone] Subjective:   [name], am serving as a scribe for Dr. Hulan Saas.  I'm seeing this patient by the request  of:  Lorrene Reid, PA-C  CC: Right knee pain  OLM:[id number]  11/24/2021 Continues to have difficulty with the knee.  Discussed with patient again secondary to age and comorbidities likely not a surgical candidate and patient wants to avoid completely as well.  This is a chronic problem that will continue to worsen.  Can repeat injection potentially in 8 weeks but would like to further evaluate in 8 to 10 weeks before we decide on that.  Updated 12/14/2021 Jamie [name] is a 87 y.o. female coming in with complaint of R knee pain. Patient states that her knee did not get better after last visit. Patient having tenderness over top and in back of knee. No new injury but pain seems to be getting worse.        Past Medical History:  Diagnosis Date   Abnormality of gait 04/18/2017   Degenerative joint disease of knee, right 03/19/2018   Injected April 18, 2018 Approved for Durolane R knee 3.4.20. Patient given another steroid injection Jun 24, 2018.  Repeat injection September 09, 2018 repeat injection February 27, 2019 repeat 6/110/20221 bilateral injections given December 01, 2019 Right knee injected April 20, 2020 monovisc May 19, 2018 Repeat steroid injection given June 16, 2020 repeat injection August 24, 2020   Essential hypertension 08/14/2016   Generalized anxiety disorder 06/27/2017   History of dizziness 06/27/2017   History of hysterectomy for benign disease 06/27/2017   History of vitamin D deficiency 08/22/2017   Leukocytes in urine 08/14/2016   Lumbar facet arthropathy 06/27/2017   Major neurocognitive disorder due to multiple etiologies 09/29/2020   Mixed hyperlipidemia 08/22/2017   Osteoarthritis of knee 09/18/2017   Pancolitis 08/14/2016   Parkinson's  disease 03/13/2017   Prediabetes 08/22/2017   Right knee pain    Spinal stenosis of lumbar region    Weakness of both hips 04/18/2017   White coat syndrome without diagnosis of hypertension 08/22/2017   Past Surgical History:  Procedure Laterality Date   HYSTERECTOMY ABDOMINAL WITH SALPINGECTOMY     MASTECTOMY W/ SENTINEL NODE BIOPSY Left 08/18/2021   Procedure: LEFT MASTECTOMY WITH TARGETED LYMPH NODE DISSECTION;  Surgeon: Coralie Keens, MD;  Location: [facility];  Service: General;  Laterality: Left;   TONSILLECTOMY     Social History   Socioeconomic History   Marital status: Widowed    Spouse name: Not on file   Number of children: 3   Years of education: 12   Highest education level: High school graduate  Occupational History   Not on file  Tobacco Use   Smoking status: Never   Smokeless tobacco: Never  Vaping Use   Vaping Use: Never used  Substance and Sexual Activity   Alcohol use: No    Alcohol/week: 0.0 standard drinks of alcohol   Drug use: No   Sexual activity: Not Currently    Birth control/protection: Surgical  Other Topics Concern   Not on file  Social History Narrative   Pt lives alone in her 1 story home- she has 3 sons, and a high school graduate. She is right handed, she drinks coffee, and one soda a week mostly water   Social Determinants of Health   Financial Resource Strain: Low Risk  (07/13/2021)   Overall Financial  Resource Strain (CARDIA)    Difficulty of Paying Living Expenses: Not hard at all  Food Insecurity: No Food Insecurity (07/13/2021)   Hunger Vital Sign    Worried About Running Out of Food in the Last Year: Never true    Ran Out of Food in the Last Year: Never true  Transportation Needs: No Transportation Needs (07/13/2021)   PRAPARE - Hydrologist (Medical): No    Lack of Transportation (Non-Medical): No  Physical Activity: Not on file  Stress: No Stress Concern Present (08/16/2021)    [place]    Feeling of Stress : Not at all  Social Connections: Moderately Integrated (08/16/2021)   Social Connection and Isolation Panel [NHANES]    Frequency of Communication with Friends and Family: More than three times a week    Frequency of Social Gatherings with Friends and Family: More than three times a week    Attends Religious Services: More than 4 times per year    Active Member of [employer] or Organizations: Yes    Attends Archivist Meetings: More than 4 times per year    Marital Status: Widowed   Allergies  Allergen Reactions   Flagyl [Metronidazole] Rash    Possible cause of rash    Family History  Problem Relation Age of Onset   Breast cancer Mother 52   Melanoma Father        dx. 44s   Lung cancer Father        dx. 67s   Healthy Son      Current Outpatient Medications (Cardiovascular):    rosuvastatin (CRESTOR) 5 MG tablet, TAKE 1 TABLET BY MOUTH AT BEDTIME     Current Outpatient Medications (Other):    carbidopa-levodopa (SINEMET IR) 25-100 MG tablet, Take 2 tablets by mouth 3 (three) times daily.   cholecalciferol (VITAMIN D3) 25 MCG (1000 UNIT) tablet, Take 2,000 Units by mouth daily.   Coenzyme Q10 (COQ-10) 100 MG CAPS, Take 1 tablet by mouth daily.   letrozole (FEMARA) 2.5 MG tablet, Take 1 tablet (2.5 mg total) by mouth daily.   mirtazapine (REMERON) 7.5 MG tablet, TAKE 1 TABLET (7.5 MG TOTAL) BY MOUTH AT BEDTIME.   Multiple Vitamin (MULTIVITAMIN WITH MINERALS) TABS tablet, Take 1 tablet by mouth daily.   Multiple Vitamins-Minerals (PRESERVISION AREDS 2 PO), Take 1 tablet by mouth in the morning and at bedtime.    NON FORMULARY, Herblax as needed   NON FORMULARY, Cholesterol Reduction   Vitamin E 100 units TABS, Take 250 mg by mouth. Once a day   Reviewed prior external information including notes and imaging from  primary care provider As well as notes that were available  from care everywhere and other healthcare systems.  Past medical history, social, surgical and family history all reviewed in electronic medical record.  No pertanent information unless stated regarding to the chief complaint.   Review of Systems:  No headache, visual changes, nausea, vomiting, diarrhea, constipation, dizziness, abdominal pain, skin rash, fevers, chills, night sweats, weight loss, swollen lymph nodes, chest pain, shortness of breath, mood changes. POSITIVE muscle aches and body aches, joint swelling  Objective  Blood pressure 118/78, pulse (!) 59, height '5\' 8"'$  (1.727 m), weight 172 lb (78 kg), SpO2 99 %.   General: No apparent distress alert and oriented x3 mood and affect normal, dressed appropriately.  HEENT: Pupils equal, extraocular movements intact  Respiratory: Patient's speak in full sentences  and does not appear short of breath  Cardiovascular: No lower extremity edema, non tender, no erythema   Right knee exam shows that patient does have significant swelling noted.  Abnormality in gait noted.  Patient does have a shuffling gait.  Right knee though does have effusion and tender to palpation.  Limited range of motion lacking the last 5 degrees of extension and only 90 degrees of flexion.  Procedure: Real-time Ultrasound Guided Injection of right knee Device: GE Logiq Q7 Ultrasound guided injection is preferred based studies that show increased duration, increased effect, greater accuracy, decreased procedural pain, increased response rate, and decreased cost with ultrasound guided versus blind injection.  Verbal informed consent obtained.  Time-out conducted.  Noted no overlying erythema, induration, or other signs of local infection.  Skin prepped in a sterile fashion.  Local anesthesia: Topical Ethyl chloride.  With sterile technique and under real time ultrasound guidance: With a 22-gauge 2 inch needle patient was injected with 4 cc of 0.5% Marcaine and aspirated  58 cc of straw-colored fluid then injected 20 cc of Kenalog 40 mg/mL  This was from a superior lateral approach.  Completed without difficulty  Pain immediately resolved suggesting accurate placement of the medication.  Advised to call if fevers/chills, erythema, induration, drainage, or persistent bleeding.  Images permanently stored and available for review in the ultrasound unit.  Impression: Technically successful ultrasound guided injection.   Impression and Recommendations:

## 2021-12-13 ENCOUNTER — Telehealth: Payer: Self-pay | Admitting: Emergency Medicine

## 2021-12-13 NOTE — Progress Notes (Signed)
"  I called the patient today about her upcoming follow-up appointment in radiation oncology.   Given the state of the COVID-19 pandemic, concerning case numbers in our community, and guidance from [facility], I offered a phone assessment with the patient to determine if coming to the clinic was necessary. She accepted.  The patient denies any symptomatic concerns.  Specifically, they report good healing of their skin in the radiation fields.  Skin is intact.    I recommended that she continue skin care by applying oil or lotion with vitamin E to the skin in the radiation fields, BID, for 2 more months.  Continue follow-up with medical oncology - follow-up is scheduled on November 7,2023 with Dr. Lindi Adie.  I explained that yearly mammograms are important for patients with intact breast tissue, and physical exams are important after mastectomy for patients that cannot undergo mammography.  I encouraged her to call if she had further questions or concerns about her healing. Otherwise, she will follow-up PRN in radiation oncology. Patient is pleased with this plan, and we will cancel her upcoming follow-up to reduce the risk of COVID-19 transmission.

## 2021-12-14 ENCOUNTER — Ambulatory Visit: Payer: Medicare PPO | Admitting: Family Medicine

## 2021-12-14 ENCOUNTER — Ambulatory Visit: Payer: Self-pay

## 2021-12-14 ENCOUNTER — Ambulatory Visit (INDEPENDENT_AMBULATORY_CARE_PROVIDER_SITE_OTHER): Payer: Medicare PPO

## 2021-12-14 VITALS — BP 118/78 | HR 59 | Ht 68.0 in | Wt 172.0 lb

## 2021-12-14 DIAGNOSIS — M1711 Unilateral primary osteoarthritis, right knee: Secondary | ICD-10-CM | POA: Diagnosis not present

## 2021-12-14 DIAGNOSIS — G8929 Other chronic pain: Secondary | ICD-10-CM | POA: Diagnosis not present

## 2021-12-14 DIAGNOSIS — M25561 Pain in right knee: Secondary | ICD-10-CM

## 2021-12-14 MED ORDER — KETOROLAC TROMETHAMINE 30 MG/ML IJ SOLN
30.0000 mg | Freq: Once | INTRAMUSCULAR | Status: AC
  Administered 2021-12-14: 30 mg via INTRAMUSCULAR

## 2021-12-14 NOTE — Patient Instructions (Signed)
Drained knee today If back of leg swells more or if you want doppler please call us Or seek medical attention if things worsen in general Keep appt in December Xray today

## 2021-12-15 ENCOUNTER — Encounter: Payer: Self-pay | Admitting: Family Medicine

## 2021-12-15 LAB — SYNOVIAL FLUID ANALYSIS, COMPLETE
Basophils, %: 0 %
Eosinophils-Synovial: 0 % (ref 0–2)
Lymphocytes-Synovial Fld: 59 % (ref 0–74)
Monocyte/Macrophage: 10 % (ref 0–69)
Neutrophil, Synovial: 29 % — ABNORMAL HIGH (ref 0–24)
Synoviocytes, %: 2 % (ref 0–15)
WBC, Synovial: 843 cells/uL — ABNORMAL HIGH (ref <150)

## 2021-12-15 NOTE — Assessment & Plan Note (Signed)
Chronic problem with worsening symptoms.  Large effusion noted today.  We will send labs of the synovial fluid to make sure there is no type of infectious etiology but does not appear so.  Patient given a Toradol injection with patient recently only having the steroid injection.  Patient still secondary to social determinants of health and comorbidities including Parkinson's and is not a good surgical candidate and wants to continue to avoid that.  We will get repeat x-ray with patient having a mild abnormal finding on previous bone scan for treatment of her ongoing cancer.  If it shows any significant changes I would recommend an MRI for further evaluation because this could change the management of the patient's cancer treatment.  Likely on the bone scan it was still arthritic changes.  Follow-up again in 6 weeks

## 2021-12-16 ENCOUNTER — Inpatient Hospital Stay
Admission: RE | Admit: 2021-12-16 | Discharge: 2021-12-16 | Disposition: A | Discharge status: Home / Self Care | Payer: Self-pay | Source: Ambulatory Visit | Attending: Radiation Oncology | Admitting: Radiation Oncology

## 2021-12-19 ENCOUNTER — Telehealth: Payer: Self-pay | Admitting: Family Medicine

## 2021-12-19 DIAGNOSIS — M792 Neuralgia and neuritis, unspecified: Secondary | ICD-10-CM | POA: Diagnosis not present

## 2021-12-19 DIAGNOSIS — L600 Ingrowing nail: Secondary | ICD-10-CM | POA: Diagnosis not present

## 2021-12-19 DIAGNOSIS — B353 Tinea pedis: Secondary | ICD-10-CM | POA: Diagnosis not present

## 2021-12-19 DIAGNOSIS — L03031 Cellulitis of right toe: Secondary | ICD-10-CM | POA: Diagnosis not present

## 2021-12-19 DIAGNOSIS — I70203 Unspecified atherosclerosis of native arteries of extremities, bilateral legs: Secondary | ICD-10-CM | POA: Diagnosis not present

## 2021-12-19 NOTE — Telephone Encounter (Signed)
Pt granddaughter called, last OV 10/25 drained knee and given injection. Swelling is already back.  [name] mentioned next plan being a doppler or MRI, they are ready to move to the next plan.

## 2021-12-20 ENCOUNTER — Other Ambulatory Visit: Payer: Self-pay

## 2021-12-20 DIAGNOSIS — M79604 Pain in right leg: Secondary | ICD-10-CM

## 2021-12-20 DIAGNOSIS — G8929 Other chronic pain: Secondary | ICD-10-CM

## 2021-12-20 NOTE — Telephone Encounter (Signed)
Both tests ordered. Patient notified and will call to schedule.

## 2021-12-21 ENCOUNTER — Ambulatory Visit (HOSPITAL_BASED_OUTPATIENT_CLINIC_OR_DEPARTMENT_OTHER)
Admission: RE | Admit: 2021-12-21 | Discharge: 2021-12-21 | Disposition: A | Discharge status: Home / Self Care | Payer: Medicare PPO | Source: Ambulatory Visit | Attending: Family Medicine | Admitting: Family Medicine

## 2021-12-21 ENCOUNTER — Ambulatory Visit (HOSPITAL_COMMUNITY): Payer: Medicare PPO

## 2021-12-21 DIAGNOSIS — M79604 Pain in right leg: Secondary | ICD-10-CM | POA: Diagnosis not present

## 2021-12-26 ENCOUNTER — Encounter: Payer: Self-pay | Admitting: *Deleted

## 2021-12-26 ENCOUNTER — Telehealth: Payer: Self-pay | Admitting: Family Medicine

## 2021-12-26 NOTE — Telephone Encounter (Signed)
Patient's grand-daughter called asking how much Ibuprofen the patient can take?  She is having a lot of pain and is waiting for the MRI scheduled next week but wanted to know what she could do in the meantime to help with her pain.

## 2021-12-27 ENCOUNTER — Inpatient Hospital Stay: Payer: Medicare PPO | Attending: Hematology and Oncology | Admitting: Hematology and Oncology

## 2021-12-27 ENCOUNTER — Inpatient Hospital Stay: Payer: Medicare PPO

## 2021-12-27 ENCOUNTER — Other Ambulatory Visit: Payer: Self-pay

## 2021-12-27 VITALS — BP 155/76 | HR 77 | Temp 97.7°F | Resp 18 | Ht 68.0 in | Wt 170.5 lb

## 2021-12-27 DIAGNOSIS — Z9012 Acquired absence of left breast and nipple: Secondary | ICD-10-CM | POA: Insufficient documentation

## 2021-12-27 DIAGNOSIS — Z170 Estrogen receptor positive status [ER+]: Secondary | ICD-10-CM | POA: Diagnosis not present

## 2021-12-27 DIAGNOSIS — Z79811 Long term (current) use of aromatase inhibitors: Secondary | ICD-10-CM | POA: Insufficient documentation

## 2021-12-27 DIAGNOSIS — Z923 Personal history of irradiation: Secondary | ICD-10-CM | POA: Insufficient documentation

## 2021-12-27 DIAGNOSIS — C50312 Malignant neoplasm of lower-inner quadrant of left female breast: Secondary | ICD-10-CM | POA: Diagnosis not present

## 2021-12-27 DIAGNOSIS — I427 Cardiomyopathy due to drug and external agent: Secondary | ICD-10-CM

## 2021-12-27 MED ORDER — TRASTUZUMAB-HYALURONIDASE-OYSK 600-10000 MG-UNT/5ML ~~LOC~~ SOLN
600.0000 mg | Freq: Once | SUBCUTANEOUS | Status: AC
  Administered 2021-12-27: 600 mg via SUBCUTANEOUS
  Filled 2021-12-27: qty 5

## 2021-12-27 MED ORDER — DIPHENHYDRAMINE HCL 25 MG PO CAPS
25.0000 mg | ORAL_CAPSULE | Freq: Once | ORAL | Status: AC
  Administered 2021-12-27: 25 mg via ORAL
  Filled 2021-12-27: qty 1

## 2021-12-27 NOTE — Patient Instructions (Signed)
[facility]  Discharge Instructions: Thank you for choosing [facility] to provide your oncology and hematology care.   If you have a lab appointment with the [facility], please go directly to the [facility] and check in at the registration area.   Wear comfortable clothing and clothing appropriate for easy access to any Portacath or PICC line.   We strive to give you quality time with your provider. You may need to reschedule your appointment if you arrive late (15 or more minutes).  Arriving late affects you and other patients whose appointments are after yours.  Also, if you miss three or more appointments without notifying the office, you may be dismissed from the clinic at the provider's discretion.      For prescription refill requests, have your pharmacy contact our office and allow 72 hours for refills to be completed.    Today you received the following chemotherapy and/or immunotherapy agents: Herceptin Hylecta.       To help prevent nausea and vomiting after your treatment, we encourage you to take your nausea medication as directed.  BELOW ARE SYMPTOMS THAT SHOULD BE REPORTED IMMEDIATELY: *FEVER GREATER THAN 100.4 F (38 C) OR HIGHER *CHILLS OR SWEATING *NAUSEA AND VOMITING THAT IS NOT CONTROLLED WITH YOUR NAUSEA MEDICATION *UNUSUAL SHORTNESS OF BREATH *UNUSUAL BRUISING OR BLEEDING *URINARY PROBLEMS (pain or burning when urinating, or frequent urination) *BOWEL PROBLEMS (unusual diarrhea, constipation, pain near the anus) TENDERNESS IN MOUTH AND THROAT WITH OR WITHOUT PRESENCE OF ULCERS (sore throat, sores in mouth, or a toothache) UNUSUAL RASH, SWELLING OR PAIN  UNUSUAL VAGINAL DISCHARGE OR ITCHING   Items with * indicate a potential emergency and should be followed up as soon as possible or go to the Emergency Department if any problems should occur.  Please show the CHEMOTHERAPY ALERT CARD or IMMUNOTHERAPY ALERT CARD at  check-in to the Emergency Department and triage nurse.  Should you have questions after your visit or need to cancel or reschedule your appointment, please contact [facility]  Dept: [phone]  and follow the prompts.  Office hours are 8:00 a.m. to 4:30 p.m. Monday - Friday. Please note that voicemails left after 4:00 p.m. may not be returned until the following business day.  We are closed weekends and major holidays. You have access to a nurse at all times for urgent questions. Please call the main number to the clinic Dept: [phone] and follow the prompts.   For any non-urgent questions, you may also contact your provider using MyChart. We now offer e-Visits for anyone 23 and older to request care online for non-urgent symptoms. For details visit [email or web address].   Also download the MyChart app! Go to the app store, search "MyChart", open the app, select [facility], and log in with your MyChart username and password.  Masks are optional in the cancer centers. If you would like for your care team to wear a mask while they are taking care of you, please let them know. You may have one support person who is at least 85 years old accompany you for your appointments.

## 2021-12-27 NOTE — Progress Notes (Signed)
Patient Care Team: Lorrene Reid, PA-C as PCP - General (Physician Assistant) [name], MD as Consulting Physician (Cardiology) Sydnee Levans, MD as Referring Physician (Dermatology) Rehabilitation, Makakilo Tat, Eustace Quail, DO as Consulting Physician (Neurology) Mauro Kaufmann, RN as Oncology Nurse Navigator Carlynn Spry, Charlott Holler, RN as Oncology Nurse Navigator Coralie Keens, MD as Consulting Physician (General Surgery) Nicholas Lose, MD as Consulting Physician (Hematology and Oncology) Eppie Gibson, MD as Attending Physician (Radiation Oncology) Lorrene Reid, PA-C as Physician Assistant (Physician Assistant)  DIAGNOSIS:  Encounter Diagnoses  Name Primary?   Malignant neoplasm of lower-inner quadrant of left breast in female, estrogen receptor positive ([place])    Cardiotoxicity ([place]) Yes    SUMMARY OF ONCOLOGIC HISTORY: Oncology History  Malignant neoplasm of lower-inner quadrant of left breast in female, estrogen receptor positive ([place])  07/11/2021 Initial Diagnosis   Skin changes in the left breast, screening mammogram revealed thickening of the lower medial quadrant along with enlarged lymph node in the axilla (2 additional lymph nodes) by ultrasound breast tumor measured 3.9 cm, biopsy: Grade 2 IDC with lymphovascular invasion ER 100%, PR 100%, HER2 positive Ki-67 40%, lymph node biopsy positive   07/13/2021 Cancer Staging   Staging form: Breast, AJCC 8th Edition - Clinical: Stage IB (cT2, cN1, cM0, G2, ER+, PR+, HER2+) - Signed by Nicholas Lose, MD on 07/13/2021 Stage prefix: Initial diagnosis Histologic grading system: 3 grade system    Genetic Testing   [facility] CustomNext Panel was Negative. Report date is 07/24/2021.  The CustomNext panel offered by [employer] includes sequencing, rearrangement, and RNA analysis for the following 41 genes: APC, ATM, BAP1, BARD1, BMPR1A, BRCA1, BRCA2, BRIP1, CDH1, CDK4, CDKN2A, CHEK2, DICER1, MLH1, MSH2, MSH6, MUTYH,  NBN, NF1, NTHL1, PALB2, PMS2, POT1, PTEN, RAD51C, RAD51D, RB1, RECQL, SMAD4, SMARCA4, STK11 and TP53 (sequencing and deletion/duplication); AXIN2, EGFR, HOXB13, MITF, MSH3, POLD1 and POLE (sequencing only); EPCAM and GREM1 (deletion/duplication only).    09/14/2021 - 10/04/2021 Chemotherapy   Patient is on Treatment Plan : BREAST Trastuzumab q21d     10/25/2021 -  Chemotherapy   Patient is on Treatment Plan : BREAST MAINTENANCE Trastuzumab IV (6) or SQ (600) D1 q21d X 11 Cycles       CHIEF COMPLIANT: Follow-up herceptin and letrozole  INTERVAL HISTORY: Jamie [name] is a 87 y.o. female is here because of recent diagnosis of left breast. She presents to the clinic today for a follow-up. She denies diarrhea. She does has some lose stools. She is tolerating the herceptin extremely well. She is having a lot of knee pain but she had some fluid drained twice from it. She complains of throat been very painful causing her to gag and wanting to throw up. She says it starts off as nausea she throws up she stands over the toilet and she feels better. Her throat hurts terribly bad and she can not swallow.   ALLERGIES:  is allergic to flagyl [metronidazole].  MEDICATIONS:  Current Outpatient Medications  Medication Sig Dispense Refill   carbidopa-levodopa (SINEMET IR) 25-100 MG tablet Take 2 tablets by mouth 3 (three) times daily. 540 tablet 2   cholecalciferol (VITAMIN D3) 25 MCG (1000 UNIT) tablet Take 2,000 Units by mouth daily.     Coenzyme Q10 (COQ-10) 100 MG CAPS Take 1 tablet by mouth daily.     letrozole (FEMARA) 2.5 MG tablet Take 1 tablet (2.5 mg total) by mouth daily. 90 tablet 3   mirtazapine (REMERON) 7.5 MG tablet TAKE 1 TABLET (7.5  MG TOTAL) BY MOUTH AT BEDTIME. 90 tablet 1   Multiple Vitamin (MULTIVITAMIN WITH MINERALS) TABS tablet Take 1 tablet by mouth daily.     Multiple Vitamins-Minerals (PRESERVISION AREDS 2 PO) Take 1 tablet by mouth in the morning and at bedtime.      NON FORMULARY  Herblax as needed     NON FORMULARY Cholesterol Reduction     rosuvastatin (CRESTOR) 5 MG tablet TAKE 1 TABLET BY MOUTH AT BEDTIME 90 tablet 1   Vitamin E 100 units TABS Take 250 mg by mouth. Once a day     No current facility-administered medications for this visit.    PHYSICAL EXAMINATION: ECOG PERFORMANCE STATUS: 1 - Symptomatic but completely ambulatory  Vitals:   12/27/21 1137  BP: (!) 155/76  Pulse: 77  Resp: 18  Temp: 97.7 F (36.5 C)  SpO2: 98%   Filed Weights   12/27/21 1137  Weight: 170 lb 8 oz (77.3 kg)      LABORATORY DATA:  I have reviewed the data as listed    Latest Ref Rng & Units 09/12/2021    2:21 PM 08/03/2021    8:55 AM 07/13/2021    8:31 AM  CMP  Glucose 70 - 99 mg/dL  113  126   BUN 8 - 27 mg/dL  22  21   Creatinine 0.44 - 1.00 mg/dL 0.70  0.75  0.86   Sodium 134 - 144 mmol/L  141  140   Potassium 3.5 - 5.2 mmol/L  4.4  4.0   Chloride 96 - 106 mmol/L  106  107   CO2 20 - 29 mmol/L  22  28   Calcium 8.7 - 10.3 mg/dL  10.1  11.0   Total Protein 6.0 - 8.5 g/dL  6.6  7.3   Total Bilirubin 0.0 - 1.2 mg/dL  0.5  0.6   Alkaline Phos 44 - 121 IU/L  83  81   AST 0 - 40 IU/L  21  18   ALT 0 - 32 IU/L  8  18     Lab Results  Component Value Date   WBC 6.9 08/03/2021   HGB 13.4 08/03/2021   HCT 39.9 08/03/2021   MCV 92 08/03/2021   PLT 202 08/03/2021   NEUTROABS 4.5 07/13/2021    ASSESSMENT & PLAN:  Malignant neoplasm of lower-inner quadrant of left breast in female, estrogen receptor positive ([place]) 07/09/2021:Skin changes in the left breast, screening mammogram revealed thickening of the lower medial quadrant along with enlarged lymph node in the axilla (2 additional lymph nodes) by ultrasound breast tumor measured 3.9 cm, biopsy: Grade 2 IDC with lymphovascular invasion ER 100%, PR 100%, HER2 positive Ki-67 40%, lymph node biopsy positive   08/18/21: Left Mastectomy: Grade 3 IDC with micropapillary features with DCIS 5.8 cm 7/7 LN Positives,  involves dermal stroma and lymphatics with focal Extra nodal invasion   09/12/2021: Bone scan focal radiotracer uptake right lateral femoral condyle (knee x-ray recommended) 09/12/2021: CT CAP: Several small left axillary lymph nodes.  No evidence of distant metastatic disease.  2 cm right thyroid nodule ------------------------------------------------------------------------------------------------------------------------  Treatment Plan: 1. adjuvant Herceptin with aromatase inhibitors started 09/14/2021 2.  Adjuvant radiation completed 11/09/2021   Adverse effects: Denies any adverse effects to either Herceptin or letrozole. Right knee swelling: Seeing orthopedics for draining her knee.  Instructed her to stop anastrozole temporarily until the knee pain improves and then she can restart it.   Return to clinic every 3 weeks for  Herceptin and every 6 weeks for follow-up with me.     Orders Placed This Encounter  Procedures   ECHOCARDIOGRAM COMPLETE    Standing Status:   Future    Standing Expiration Date:   12/28/2022    Order Specific Question:   Where should this test be performed    Answer:   [facility]    Order Specific Question:   Perflutren DEFINITY (image enhancing agent) should be administered unless hypersensitivity or allergy exist    Answer:   Administer Perflutren    Order Specific Question:   Reason for exam-Echo    Answer:   Chemo  Z09   The patient has a good understanding of the overall plan. she agrees with it. she will call with any problems that may develop before the next visit here. Total time spent: 30 mins including face to face time and time spent for planning, charting and co-ordination of care   Harriette Ohara, MD 12/27/21    I Gardiner Coins am scribing for Dr. Lindi Adie  I have reviewed the above documentation for accuracy and completeness, and I agree with the above.

## 2021-12-27 NOTE — Assessment & Plan Note (Signed)
07/09/2021:Skin changes in the left breast, screening mammogram revealed thickening of the lower medial quadrant along with enlarged lymph node in the axilla (2 additional lymph nodes) by ultrasound breast tumor measured 3.9 cm, biopsy: Grade 2 IDC with lymphovascular invasion ER 100%, PR 100%, HER2 positive Ki-67 40%, lymph node biopsy positive   08/18/21: Left Mastectomy: Grade 3 IDC with micropapillary features with DCIS 5.8 cm 7/7 LN Positives, involves dermal stroma and lymphatics with focal Extra nodal invasion   09/12/2021: Bone scan focal radiotracer uptake right lateral femoral condyle (knee x-ray recommended) 09/12/2021: CT CAP: Several small left axillary lymph nodes.  No evidence of distant metastatic disease.  2 cm right thyroid nodule ------------------------------------------------------------------------------------------------------------------------  Treatment Plan: 1. adjuvant Herceptin with aromatase inhibitors started 09/14/2021 2.  Adjuvant radiation completed 11/09/2021   Adverse effects: Denies any adverse effects to either Herceptin or letrozole.   Return to clinic every 3 weeks for Herceptin and every 6 weeks for follow-up with me.

## 2021-12-27 NOTE — Progress Notes (Signed)
Per Lindi Adie MD, ok to treat with ECHO from 09/13/2021, EF 64%

## 2021-12-28 NOTE — Telephone Encounter (Signed)
Spoke with [name]. Karess would like to start with IBU and will call back if she decides on antibiotic.

## 2021-12-29 ENCOUNTER — Telehealth: Payer: Self-pay | Admitting: Hematology and Oncology

## 2021-12-29 NOTE — Telephone Encounter (Signed)
Scheduled appointment per WQ. Talked to the patients relative and she is aware of the patients upcoming appointments.

## 2022-01-02 ENCOUNTER — Encounter: Payer: Self-pay | Admitting: *Deleted

## 2022-01-03 DIAGNOSIS — M792 Neuralgia and neuritis, unspecified: Secondary | ICD-10-CM | POA: Diagnosis not present

## 2022-01-03 DIAGNOSIS — L600 Ingrowing nail: Secondary | ICD-10-CM | POA: Diagnosis not present

## 2022-01-03 DIAGNOSIS — B353 Tinea pedis: Secondary | ICD-10-CM | POA: Diagnosis not present

## 2022-01-03 DIAGNOSIS — L03031 Cellulitis of right toe: Secondary | ICD-10-CM | POA: Diagnosis not present

## 2022-01-06 ENCOUNTER — Ambulatory Visit
Admission: RE | Admit: 2022-01-06 | Discharge: 2022-01-06 | Disposition: A | Discharge status: Home / Self Care | Payer: Medicare PPO | Source: Ambulatory Visit | Attending: Family Medicine | Admitting: Family Medicine

## 2022-01-06 DIAGNOSIS — S83241A Other tear of medial meniscus, current injury, right knee, initial encounter: Secondary | ICD-10-CM | POA: Diagnosis not present

## 2022-01-06 DIAGNOSIS — M25461 Effusion, right knee: Secondary | ICD-10-CM | POA: Diagnosis not present

## 2022-01-06 DIAGNOSIS — M1711 Unilateral primary osteoarthritis, right knee: Secondary | ICD-10-CM | POA: Diagnosis not present

## 2022-01-06 DIAGNOSIS — G8929 Other chronic pain: Secondary | ICD-10-CM

## 2022-01-06 NOTE — Progress Notes (Unsigned)
Assessment/Plan:   1.  Parkinsons Disease/SWEDD  -continue carbidopa/levodopa 25/100, 2 tablets 3 times daily.    2.  Insomnia  -Has received benefit with melatonin, 2.5 mg nightly and will continue that  3.  Depression  -Continue Lexapro, 20 mg daily.  She is doing well with this.    -PA just started mirtazapine   4.  Dementia, mixed type, PDD and AD  -This was identified on neurocognitive testing in August, 2022.  5.  Breast cancer  -Status post left mastectomy, currently undergoing chemotherapy.  Has completed radiation. 6.  Urinary incontinence at night  -saw Dr. Zigmund Daniel one time and doing well.  Subjective:   Jamie [name] was seen today in follow up for Parkinsons disease.  My previous records were reviewed prior to todays visit as well as outside records available to me.  Patient with daughter in law who supplements the history. \Patient was diagnosed with breast cancer since our last visit.  She has undergone mastectomy, chemotherapy and has completed radiation.  From a Parkinson's standpoint, she is doing fairly well.  She does c/o burning with swallowing.  No choking.  She has GERD issues in the past but is not on GERD medication now.  Having knee issues and f/u with Dr. Tamala Julian  Current prescribed movement disorder medications: Carbidopa/levodopa 25/100, 2 tablets 3 times per day  Melatonin, 3 mg daily Lexapro 20 mg Mirtazapine started by PA     ALLERGIES:   Allergies  Allergen Reactions   Flagyl [Metronidazole] Rash    Possible cause of rash     CURRENT MEDICATIONS:  Outpatient Encounter Medications as of 01/10/2022  Medication Sig   cholecalciferol (VITAMIN D3) 25 MCG (1000 UNIT) tablet Take 2,000 Units by mouth daily.   Coenzyme Q10 (COQ-10) 100 MG CAPS Take 1 tablet by mouth daily.   letrozole (FEMARA) 2.5 MG tablet Take 1 tablet (2.5 mg total) by mouth daily.   mirtazapine (REMERON) 7.5 MG tablet TAKE 1 TABLET (7.5 MG TOTAL) BY MOUTH AT BEDTIME.    Multiple Vitamin (MULTIVITAMIN WITH MINERALS) TABS tablet Take 1 tablet by mouth daily.   Multiple Vitamins-Minerals (PRESERVISION AREDS 2 PO) Take 1 tablet by mouth in the morning and at bedtime.    NON FORMULARY Herblax as needed   NON FORMULARY Cholesterol Reduction   rosuvastatin (CRESTOR) 5 MG tablet TAKE 1 TABLET BY MOUTH AT BEDTIME   Vitamin E 100 units TABS Take 250 mg by mouth. Once a day   [DISCONTINUED] carbidopa-levodopa (SINEMET IR) 25-100 MG tablet Take 2 tablets by mouth 3 (three) times daily.   carbidopa-levodopa (SINEMET IR) 25-100 MG tablet Take 2 tablets by mouth 3 (three) times daily.   No facility-administered encounter medications on file as of 01/10/2022.    Objective:   PHYSICAL EXAMINATION:    VITALS:   Vitals:   01/10/22 1046  BP: 123/78  Pulse: 77  SpO2: 98%  Weight: 170 lb 6.4 oz (77.3 kg)  Height: '5\' 8"'$  (1.727 m)    Wt Readings from Last 3 Encounters:  01/10/22 170 lb 6.4 oz (77.3 kg)  12/27/21 170 lb 8 oz (77.3 kg)  12/14/21 172 lb (78 kg)    GEN:  The patient appears stated age and is in NAD. HEENT:  Normocephalic, atraumatic.  The mucous membranes are moist.   Neurological examination:  Orientation: The patient is alert and oriented x3. Cranial nerves: There is good facial symmetry with significant facial hypomimia. The speech is fluent and clear.  Soft palate rises symmetrically and there is no tongue deviation. Hearing is intact to conversational tone. Sensation: Sensation is intact to light touch throughout Motor: Strength is at least antigravity x4.  Movement examination: Tone: There is paratonia bilaterally in the UE Abnormal movements: None Coordination:  There is decremation on the L Gait and Station: The patient pushes off to arise.  She ambulates well in the hall today with decreased arm swing today bilaterally  I have reviewed and interpreted the following labs independently    Chemistry      Component Value Date/Time   NA  141 08/03/2021 0855   K 4.4 08/03/2021 0855   CL 106 08/03/2021 0855   CO2 22 08/03/2021 0855   BUN 22 08/03/2021 0855   CREATININE 0.70 09/12/2021 1421   CREATININE 0.86 07/13/2021 0831   GLU 98 05/30/2017 0000      Component Value Date/Time   CALCIUM 10.1 08/03/2021 0855   ALKPHOS 83 08/03/2021 0855   AST 21 08/03/2021 0855   AST 18 07/13/2021 0831   ALT 8 08/03/2021 0855   ALT 18 07/13/2021 0831   BILITOT 0.5 08/03/2021 0855   BILITOT 0.6 07/13/2021 0831       Lab Results  Component Value Date   WBC 6.9 08/03/2021   HGB 13.4 08/03/2021   HCT 39.9 08/03/2021   MCV 92 08/03/2021   PLT 202 08/03/2021    Lab Results  Component Value Date   TSH 4.950 (H) 11/22/2021     Cc:  Lorrene Reid, PA-C

## 2022-01-09 ENCOUNTER — Encounter: Payer: Self-pay | Admitting: Hematology and Oncology

## 2022-01-09 ENCOUNTER — Ambulatory Visit (HOSPITAL_COMMUNITY)
Admission: RE | Admit: 2022-01-09 | Discharge: 2022-01-09 | Disposition: A | Discharge status: Home / Self Care | Payer: Medicare PPO | Source: Ambulatory Visit | Attending: Hematology and Oncology | Admitting: Hematology and Oncology

## 2022-01-09 DIAGNOSIS — I427 Cardiomyopathy due to drug and external agent: Secondary | ICD-10-CM | POA: Insufficient documentation

## 2022-01-09 DIAGNOSIS — I358 Other nonrheumatic aortic valve disorders: Secondary | ICD-10-CM | POA: Diagnosis not present

## 2022-01-09 DIAGNOSIS — C50312 Malignant neoplasm of lower-inner quadrant of left female breast: Secondary | ICD-10-CM

## 2022-01-09 DIAGNOSIS — Z170 Estrogen receptor positive status [ER+]: Secondary | ICD-10-CM | POA: Insufficient documentation

## 2022-01-09 DIAGNOSIS — G20A1 Parkinson's disease without dyskinesia, without mention of fluctuations: Secondary | ICD-10-CM | POA: Insufficient documentation

## 2022-01-09 DIAGNOSIS — Z0189 Encounter for other specified special examinations: Secondary | ICD-10-CM

## 2022-01-09 DIAGNOSIS — I119 Hypertensive heart disease without heart failure: Secondary | ICD-10-CM | POA: Insufficient documentation

## 2022-01-09 LAB — ECHOCARDIOGRAM COMPLETE
Area-P 1/2: 3.99 cm2
S' Lateral: 2.7 cm

## 2022-01-09 NOTE — Progress Notes (Signed)
  Echocardiogram 2D Echocardiogram has been performed.  Jamie [name] M 01/09/2022, 10:41 AM

## 2022-01-10 ENCOUNTER — Encounter: Payer: Self-pay | Admitting: Neurology

## 2022-01-10 ENCOUNTER — Ambulatory Visit (INDEPENDENT_AMBULATORY_CARE_PROVIDER_SITE_OTHER): Payer: Medicare PPO | Admitting: Neurology

## 2022-01-10 VITALS — BP 123/78 | HR 77 | Ht 68.0 in | Wt 170.4 lb

## 2022-01-10 DIAGNOSIS — G20A1 Parkinson's disease without dyskinesia, without mention of fluctuations: Secondary | ICD-10-CM

## 2022-01-10 MED ORDER — CARBIDOPA-LEVODOPA 25-100 MG PO TABS: 2.0000 | ORAL_TABLET | Freq: Three times a day (TID) | ORAL | 2 refills | Status: DC

## 2022-01-10 NOTE — Patient Instructions (Signed)
Local and Online Resources for Power over Parkinson's Group  November 2023    LOCAL [place] PARKINSON'S GROUPS   Power over Parkinson's Group:    Power Over Parkinson's Patient Education Group will be Wednesday, November 8th-*Hybrid meting*- in person at [facility] location and via [facility], 2:00-3:00 pm.   Starting in November, Power over [employer] and Care Partner Groups will meet together, with plans for separate break out session for caregivers (*this will be evolving over the next few months) Upcoming Power over Parkinson's Meetings/Care Partner Support:  2nd Wednesdays of the month at 2 pm:   November 8th, December 13th  [place] at [email or web address] if interested in participating in this group    [place] and Fall Prevention Workshop.  Thursday, November 9th 1-2pm, Studio [place], [employer].  Register with Vonna Kotyk at [email or web address] or [phone] New PWR! Moves [employer] Instructor-Led Classes offering at [employer]!  TUESDAYS and Wednesdays 1-2 pm.   Contact Vonna Kotyk at  [email or web address]  or [phone] (Tuesday classes are modified for chair and standing only) Dance for Parkinson 's classes will be on Tuesdays 9:30am-10:30am starting October 3-December 12 with a break the week of November 21st. Located in the [employer] , in the first floor of the [employer] ([place].) To register:  [email or web address] or [phone]  Drumming for Parkinson's will be held on 2nd and 4th Mondays at 11:00 am.   Located at the [place] ([place]  [place] at [email or web address] or [phone]  Through support from the [place] for Parkinson's classes are free for both patients and caregivers.    [employer] Parkinson's Tai Chi  Class, Mondays at 11 am.  Call [phone] for details Parkinson's Holiday Party.  Wednesday, December 6th, 4:00-5:00 pm.  [facility] and Fitness.  RSVP to Garnetta Buddy at [phone] or [email or web address]   [place]:  [email or web address]  PD Health at Home continues:  Mindfulness Mondays, Wellness Wednesdays, Fitness Fridays   Upcoming Education:   Why Should you Participate in Parkinson's Research?  Wednesday, Nov. 29th,  1-2 pm  Expert Briefing:    Hallucinations and Delusions in Parkinson's.  Wednesday, Nov. 8th, 1-2 pm  Register for expert briefings (webinars) at [email or web address]  Please check out their website to sign up for emails and see their full online offerings      [place]:  [email or web address]   Third Thursday Webinars:  On the third Thursday of every month at 12 p.m. ET, join our free live webinars to learn about various aspects of living with Parkinson's disease and our work to speed medical breakthroughs.  Upcoming Webinar:  A Year Like No Other in Parkinson's Research:  2023 in Review.  Thursday, November 16th 12 noon. Check out additional information on their website to see their full online offerings    [facility]:  [email or web address]  Upcoming Webinar:   Stay tuned  Webinar Series:  Living with Parkinson's Meetup.   Third Thursdays each month, 3 pm  Care Partner Monthly Meetup.  With Robin Searing Phinney.  First Tuesday of each month, 2 pm  Check out additional information to Live Well Today on their website    Parkinson and Movement Disorders (PMD) Alliance:  www.[email or web address]  [employer]:  Online Education Events  Sign up for emails, which are sent weekly to give  you updates on programming and online offerings    Parkinson's Association of the [place]:  www.[email or web address]   Information on online support groups, education events, and online exercises including Yoga, Parkinson's exercises and more-LOTS of information on links to PD resources and online events  Virtual Support Group through Parkinson's Association of the [place]; next one is scheduled for Wednesday, November 1st  at 2 pm.  (These are typically scheduled for the 1st Wednesday of the month at 2 pm).  Visit website for details.   MOVEMENT AND EXERCISE OPPORTUNITIES  PWR! Moves Classes at [place].  Wednesdays 10 and 11 am.   Contact [name] Marriott, PT [email or web address] if interested.  NEW PWR! Moves Class offerings at [employer].  *TUESDAYS* and Wednesdays 1-2 pm.  Contact Vonna Kotyk at  [email or web address]    Parkinson's Wellness Recovery (PWR! Moves)  [email or web address]  Info on the PWR! Virtual Experience:  You will have access to our expertise?through self-assessment, guided plans that start with the PD-specific fundamentals, educational content, tips, Q&A with an expert, and a growing Art therapist of PD-specific pre-recorded and live exercise classes of varying types and intensity - both physical and cognitive! If that is not enough, we offer 1:1 wellness consultations (in-person or virtual) to personalize your PWR! Research scientist (medical).   [place] Fridays:   As part of the PD Health @ Home program, this free video series focuses each week on one aspect of fitness designed to support people living with Parkinson's.? These weekly videos highlight the [place] fitness guidelines for people with Parkinson's disease.  [email or web address]   Dance for PD website is offering free, live-stream classes throughout the week, as well as links to [employer] of classes:  [email or web address]  Virtual dance and Pilates for Parkinson's classes: Click on the Community Tab> Parkinson's  Movement Initiative Tab.  To register for classes and for more information, visit [email or web address] and click the "community" tab.   [employer] Parkinson's Cycling Classes   [employer]:  Thursdays @ Noon-Live classes at [employer] ([employer] at [email or web address]?or [phone])  [facility] [employer]: Virtual Classes Mondays and Thursdays Jeanette Caprice classes Tuesday, Wednesday and Thursday (contact [place] at [email or web address] ?or [phone])  [place]  Varied levels of classes are offered Tuesdays and Thursdays at [employer].   Stretching with Verdis Frederickson weekly class is also offered for people with Parkinson's  To observe a class or for more information, call [phone] or email Hezzie Bump at [email or web address]   ADDITIONAL SUPPORT AND RESOURCES  [employer]:Online Caregiver Education Opportunities:  www.well-[email or web address]/caregiver-education/[email or web address]-support-group.  You may also contact Vickki Muff at [email or web address] or [phone].     Well-Spring Navigator:  Just1Navigator program, a?free service to help individuals and families through the journey of determining care for older adults.  The "Navigator" is a Education officer, museum, Arnell Asal, who will speak with a prospective client and/or loved ones to provide an assessment of the situation and a set of recommendations for a personalized care plan -- all free of charge, and whether?Well[employer]Spring [employer] offers the needed service or not. If the need is not a service we provide, we are well-connected with reputable programs in town that we can refer you to.  www.well-springsolutions.org or to speak with the Navigator, call [phone].

## 2022-01-17 ENCOUNTER — Ambulatory Visit: Payer: Medicare PPO

## 2022-01-17 ENCOUNTER — Inpatient Hospital Stay: Payer: Medicare PPO

## 2022-01-17 ENCOUNTER — Other Ambulatory Visit: Payer: Self-pay

## 2022-01-17 VITALS — BP 148/68 | HR 80 | Temp 97.9°F | Resp 17 | Wt 171.8 lb

## 2022-01-17 DIAGNOSIS — Z923 Personal history of irradiation: Secondary | ICD-10-CM | POA: Diagnosis not present

## 2022-01-17 DIAGNOSIS — C50312 Malignant neoplasm of lower-inner quadrant of left female breast: Secondary | ICD-10-CM | POA: Diagnosis not present

## 2022-01-17 DIAGNOSIS — Z170 Estrogen receptor positive status [ER+]: Secondary | ICD-10-CM | POA: Diagnosis not present

## 2022-01-17 DIAGNOSIS — Z79811 Long term (current) use of aromatase inhibitors: Secondary | ICD-10-CM | POA: Diagnosis not present

## 2022-01-17 DIAGNOSIS — Z9012 Acquired absence of left breast and nipple: Secondary | ICD-10-CM | POA: Diagnosis not present

## 2022-01-17 MED ORDER — TRASTUZUMAB-HYALURONIDASE-OYSK 600-10000 MG-UNT/5ML ~~LOC~~ SOLN
600.0000 mg | Freq: Once | SUBCUTANEOUS | Status: AC
  Administered 2022-01-17: 600 mg via SUBCUTANEOUS
  Filled 2022-01-17: qty 5

## 2022-01-17 MED ORDER — DIPHENHYDRAMINE HCL 25 MG PO CAPS
25.0000 mg | ORAL_CAPSULE | Freq: Once | ORAL | Status: AC
  Administered 2022-01-17: 25 mg via ORAL
  Filled 2022-01-17: qty 1

## 2022-01-17 MED ORDER — SODIUM CHLORIDE 0.9 % IV SOLN: Freq: Once | INTRAVENOUS | Status: DC

## 2022-01-18 ENCOUNTER — Other Ambulatory Visit: Payer: Self-pay | Admitting: Nurse Practitioner

## 2022-01-20 NOTE — Progress Notes (Unsigned)
[place] Phone: [phone] Subjective:    I'm seeing this patient by the request  of:  Lorrene Reid, PA-C  CC: Knee pain follow-up  AXE:[id number]  12/14/2021 Chronic problem with worsening symptoms.  Large effusion noted today.  We will send labs of the synovial fluid to make sure there is no type of infectious etiology but does not appear so.  Patient given a Toradol injection with patient recently only having the steroid injection.  Patient still secondary to social determinants of health and comorbidities including Parkinson's and is not a good surgical candidate and wants to continue to avoid that.  We will get repeat x-ray with patient having a mild abnormal finding on previous bone scan for treatment of her ongoing cancer.  If it shows any significant changes I would recommend an MRI for further evaluation because this could change the management of the patient's cancer treatment.  Likely on the bone scan it was still arthritic changes.  Follow-up again in 6 weeks      Update 01/24/2022 Jamie [name] is a 87 y.o. female coming in with complaint of R knee pain. Patient states that the knee is not as swollen but is painful. Had a doppler and MRI done that showed more arthritis. States that the draining of the knee and injection did not help, but with everything with cancer treatment stuff they are not sure if it was because of the medication she was on, she is not taking it anymore. Patient is wanting to try an injection to see if it is helpful now that she is off that medication. Left knee has started to hurt from compensating.   MRI R knee 01/06/2022 IMPRESSION: Tricompartment osteoarthritis, most severe in the medial and patellofemoral compartments.   Radial tear of the posterior horn of the medial meniscus near the posterior root with mild extrusion.   Moderate-sized joint effusion with synovitis.          Past Medical History:  Diagnosis Date   Abnormality of gait 04/18/2017   Degenerative joint disease of knee, right 03/19/2018   Injected April 18, 2018 Approved for Durolane R knee 3.4.20. Patient given another steroid injection Jun 24, 2018.  Repeat injection September 09, 2018 repeat injection February 27, 2019 repeat 6/110/20221 bilateral injections given December 01, 2019 Right knee injected April 20, 2020 monovisc May 19, 2018 Repeat steroid injection given June 16, 2020 repeat injection August 24, 2020   Essential hypertension 08/14/2016   Generalized anxiety disorder 06/27/2017   History of dizziness 06/27/2017   History of hysterectomy for benign disease 06/27/2017   History of vitamin D deficiency 08/22/2017   Leukocytes in urine 08/14/2016   Lumbar facet arthropathy 06/27/2017   Major neurocognitive disorder due to multiple etiologies 09/29/2020   Mixed hyperlipidemia 08/22/2017   Osteoarthritis of knee 09/18/2017   Pancolitis 08/14/2016   Parkinson's disease 03/13/2017   Prediabetes 08/22/2017   Right knee pain    Spinal stenosis of lumbar region    Weakness of both hips 04/18/2017   White coat syndrome without diagnosis of hypertension 08/22/2017   Past Surgical History:  Procedure Laterality Date   HYSTERECTOMY ABDOMINAL WITH SALPINGECTOMY     MASTECTOMY W/ SENTINEL NODE BIOPSY Left 08/18/2021   Procedure: LEFT MASTECTOMY WITH TARGETED LYMPH NODE DISSECTION;  Surgeon: Coralie Keens, MD;  Location: [facility];  Service: General;  Laterality: Left;   TONSILLECTOMY     Social  History   Socioeconomic History   Marital status: Widowed    Spouse name: Not on file   Number of children: 3   Years of education: 12   Highest education level: High school graduate  Occupational History   Not on file  Tobacco Use   Smoking status: Never   Smokeless tobacco: Never  Vaping Use   Vaping Use: Never used  Substance and Sexual Activity   Alcohol use: No     Alcohol/week: 0.0 standard drinks of alcohol   Drug use: No   Sexual activity: Not Currently    Birth control/protection: Surgical  Other Topics Concern   Not on file  Social History Narrative   Pt lives alone in her 1 story home- she has 3 sons, and a high school graduate. She is right handed, she drinks coffee, and one soda a week mostly water   Social Determinants of Health   Financial Resource Strain: Low Risk  (07/13/2021)   Overall Financial Resource Strain (CARDIA)    Difficulty of Paying Living Expenses: Not hard at all  Food Insecurity: No Food Insecurity (07/13/2021)   Hunger Vital Sign    Worried About Running Out of Food in the Last Year: Never true    Ran Out of Food in the Last Year: Never true  Transportation Needs: No Transportation Needs (07/13/2021)   PRAPARE - Hydrologist (Medical): No    Lack of Transportation (Non-Medical): No  Physical Activity: Not on file  Stress: No Stress Concern Present (08/16/2021)   [place]    Feeling of Stress : Not at all  Social Connections: Moderately Integrated (08/16/2021)   Social Connection and Isolation Panel [NHANES]    Frequency of Communication with Friends and Family: More than three times a week    Frequency of Social Gatherings with Friends and Family: More than three times a week    Attends Religious Services: More than 4 times per year    Active Member of [employer] or Organizations: Yes    Attends Archivist Meetings: More than 4 times per year    Marital Status: Widowed   Allergies  Allergen Reactions   Flagyl [Metronidazole] Rash    Possible cause of rash    Family History  Problem Relation Age of Onset   Breast cancer Mother 14   Melanoma Father        dx. 30s   Lung cancer Father        dx. 15s   Healthy Son      Current Outpatient Medications (Cardiovascular):    rosuvastatin (CRESTOR) 5 MG  tablet, TAKE 1 TABLET BY MOUTH AT BEDTIME     Current Outpatient Medications (Other):    carbidopa-levodopa (SINEMET IR) 25-100 MG tablet, Take 2 tablets by mouth 3 (three) times daily.   cholecalciferol (VITAMIN D3) 25 MCG (1000 UNIT) tablet, Take 2,000 Units by mouth daily.   Coenzyme Q10 (COQ-10) 100 MG CAPS, Take 1 tablet by mouth daily.   escitalopram (LEXAPRO) 20 MG tablet, Take 20 mg by mouth daily.   letrozole (FEMARA) 2.5 MG tablet, Take 1 tablet (2.5 mg total) by mouth daily.   mirtazapine (REMERON) 7.5 MG tablet, TAKE 1 TABLET (7.5 MG TOTAL) BY MOUTH AT BEDTIME.   Multiple Vitamin (MULTIVITAMIN WITH MINERALS) TABS tablet, Take 1 tablet by mouth daily.   Multiple Vitamins-Minerals (PRESERVISION AREDS 2 PO), Take 1 tablet by mouth in the morning  and at bedtime.    NON FORMULARY, Herblax as needed   NON FORMULARY, Cholesterol Reduction   Vitamin E 100 units TABS, Take 250 mg by mouth. Once a day   Reviewed prior external information including notes and imaging from  primary care provider As well as notes that were available from care everywhere and other healthcare systems.  Past medical history, social, surgical and family history all reviewed in electronic medical record.  No pertanent information unless stated regarding to the chief complaint.   Review of Systems:  No headache, visual changes, nausea, vomiting, diarrhea, constipation, dizziness, abdominal pain, skin rash, fevers, chills, night sweats, weight loss, swollen lymph nodes, body aches, joint swelling, chest pain, shortness of breath, mood changes. POSITIVE muscle aches  Objective  Blood pressure 132/60, pulse 85, height '5\' 8"'$  (1.727 m), weight 170 lb (77.1 kg), SpO2 93 %.   General: No apparent distress alert and oriented x3 mood and affect normal, dressed appropriately.  HEENT: Pupils equal, extraocular movements intact  Respiratory: Patient's speak in full sentences and does not appear short of breath   Cardiovascular: No lower extremity edema, non tender, no erythema  Antalgic gait noted.  Patient does have a shuffling gait noted. Mild tremor noted over the patient does have instability with swelling noted of the right knee.  Instability with the right varus and valgus force.  Patient does have tenderness over the   After informed written and verbal consent, patient was seated on exam table. Right knee was prepped with alcohol swab and utilizing anterolateral approach, patient's right knee space was injected with 4:1  marcaine 0.5%: Kenalog '40mg'$ /dL. Patient tolerated the procedure well without immediate complications.  After informed written and verbal consent, patient was seated on exam table. Left knee was prepped with alcohol swab and utilizing anterolateral approach, patient's left knee space was injected with 4:1  marcaine 0.5%: Kenalog '40mg'$ /dL. Patient tolerated the procedure well without immediate complications.   Impression and Recommendations:    The above documentation has been reviewed and is accurate and complete Jamie Pulley, DO

## 2022-01-24 ENCOUNTER — Ambulatory Visit: Payer: Self-pay

## 2022-01-24 ENCOUNTER — Ambulatory Visit: Payer: Medicare PPO | Admitting: Family Medicine

## 2022-01-24 ENCOUNTER — Ambulatory Visit (INDEPENDENT_AMBULATORY_CARE_PROVIDER_SITE_OTHER): Payer: Medicare PPO

## 2022-01-24 VITALS — BP 132/60 | HR 85 | Ht 68.0 in | Wt 170.0 lb

## 2022-01-24 DIAGNOSIS — M25561 Pain in right knee: Secondary | ICD-10-CM

## 2022-01-24 DIAGNOSIS — G8929 Other chronic pain: Secondary | ICD-10-CM

## 2022-01-24 DIAGNOSIS — M170 Bilateral primary osteoarthritis of knee: Secondary | ICD-10-CM | POA: Diagnosis not present

## 2022-01-24 DIAGNOSIS — M25562 Pain in left knee: Secondary | ICD-10-CM | POA: Diagnosis not present

## 2022-01-24 NOTE — Assessment & Plan Note (Signed)
Chronic problems with worsening symptoms.  Now affecting contralateral knee.  Patient is still undergoing cancer diagnosis.  Patient can have injections If needed follow-up with me again in 8 weeks.  Due to patient's other comorbidities reviewed significant treatment to make different types of medication This is

## 2022-01-24 NOTE — Patient Instructions (Addendum)
Injected both knees  Xray on your way out See me again in 8 weeks

## 2022-01-30 ENCOUNTER — Ambulatory Visit: Payer: Medicare PPO | Attending: Surgery

## 2022-01-30 VITALS — Wt 169.1 lb

## 2022-01-30 DIAGNOSIS — Z483 Aftercare following surgery for neoplasm: Secondary | ICD-10-CM | POA: Insufficient documentation

## 2022-01-30 NOTE — Therapy (Signed)
OUTPATIENT PHYSICAL THERAPY SOZO SCREENING NOTE   Patient Name: Jamie [name] MRN: [id number] DOB:[birth year], 87 y.o., female Today's Date: 01/30/2022  PCP: Lorrene Reid, PA-C REFERRING PROVIDER: Coralie Keens, MD   PT End of Session - 01/30/22 0908     Visit Number 3   # unchanged due to screen only   PT Start Time 0905    PT Stop Time 0910    PT Time Calculation (min) 5 min    Activity Tolerance Patient tolerated treatment well    Behavior During Therapy [name] for tasks assessed/performed             Past Medical History:  Diagnosis Date   Abnormality of gait 04/18/2017   Degenerative joint disease of knee, right 03/19/2018   Injected April 18, 2018 Approved for Durolane R knee 3.4.20. Patient given another steroid injection Jun 24, 2018.  Repeat injection September 09, 2018 repeat injection February 27, 2019 repeat 6/110/20221 bilateral injections given December 01, 2019 Right knee injected April 20, 2020 monovisc May 19, 2018 Repeat steroid injection given June 16, 2020 repeat injection August 24, 2020   Essential hypertension 08/14/2016   Generalized anxiety disorder 06/27/2017   History of dizziness 06/27/2017   History of hysterectomy for benign disease 06/27/2017   History of vitamin D deficiency 08/22/2017   Leukocytes in urine 08/14/2016   Lumbar facet arthropathy 06/27/2017   Major neurocognitive disorder due to multiple etiologies 09/29/2020   Mixed hyperlipidemia 08/22/2017   Osteoarthritis of knee 09/18/2017   Pancolitis 08/14/2016   Parkinson's disease 03/13/2017   Prediabetes 08/22/2017   Right knee pain    Spinal stenosis of lumbar region    Weakness of both hips 04/18/2017   White coat syndrome without diagnosis of hypertension 08/22/2017   Past Surgical History:  Procedure Laterality Date   HYSTERECTOMY ABDOMINAL WITH SALPINGECTOMY     MASTECTOMY W/ SENTINEL NODE BIOPSY Left 08/18/2021   Procedure: LEFT MASTECTOMY WITH TARGETED LYMPH NODE  DISSECTION;  Surgeon: Coralie Keens, MD;  Location: [facility];  Service: General;  Laterality: Left;   TONSILLECTOMY     Patient Active Problem List   Diagnosis Date Noted   S/P left mastectomy 08/18/2021   Genetic testing 07/25/2021   Family history of breast cancer 07/14/2021   Malignant neoplasm of lower-inner quadrant of left breast in female, estrogen receptor positive ([place]) 07/11/2021   Mild episode of recurrent major depressive disorder ([place]) 04/06/2021   Age-related osteoporosis without current pathological fracture 04/06/2021   Greater trochanteric bursitis of right hip 01/21/2021   Major neurocognitive disorder due to multiple etiologies 09/29/2020   Right knee pain 05/07/2018   Osteoarthritis of knees, bilateral 03/19/2018   Osteoarthritis of knee 09/18/2017   Mixed hyperlipidemia 08/22/2017   Prediabetes 08/22/2017   White coat syndrome without diagnosis of hypertension 08/22/2017   History of vitamin D deficiency 08/22/2017   Lumbar facet arthropathy 06/27/2017   History of hysterectomy for benign disease 06/27/2017   Generalized anxiety disorder 06/27/2017   History of dizziness 06/27/2017   Other fatigue 06/27/2017   Overweight (BMI 25.0-29.9) 06/27/2017   Weakness of both hips 04/18/2017   Abnormality of gait 04/18/2017   Spinal stenosis of lumbar region 04/18/2017   Parkinson's disease 03/13/2017   Pancolitis 08/14/2016   Essential hypertension 08/14/2016   Leukocytes in urine 08/14/2016    REFERRING DIAG: left breast cancer at risk for lymphedema  THERAPY DIAG: Aftercare following surgery for neoplasm  PERTINENT HISTORY: Patient was diagnosed on 06/27/2021  with left grade 2 invasive ductal carcinoma breast cancer. She underwent a left mastectomy and targeted axillary lymph node dissection (7/7 nodes positive) on 08/18/2021. It is triple positive with a Ki67 of 40%. Patient has cognitive deficits effecting memory and was diagnosed with  Parkinson's in 2020. She is triple positive with a Ki67 of 40%  PRECAUTIONS: left UE Lymphedema risk, None  SUBJECTIVE: Pt returns for her 3 month L-Dex screen.   PAIN:  Are you having pain? No  SOZO SCREENING: Patient was assessed today using the SOZO machine to determine the lymphedema index score. This was compared to her baseline score. It was determined that she is within the recommended range when compared to her baseline and no further action is needed at this time. She will continue SOZO screenings. These are done every 3 months for 2 years post operatively followed by every 6 months for 2 years, and then annually.   L-DEX FLOWSHEETS - 01/30/22 0900       L-DEX LYMPHEDEMA SCREENING   Measurement Type Unilateral    L-DEX MEASUREMENT EXTREMITY Upper Extremity    POSITION  Standing    DOMINANT SIDE Right    At Risk Side Left    BASELINE SCORE (UNILATERAL) 0.1    L-DEX SCORE (UNILATERAL) 3.3    VALUE CHANGE (UNILAT) 3.2               Otelia Limes, PTA 01/30/2022, 9:12 AM

## 2022-02-03 ENCOUNTER — Encounter: Payer: Self-pay | Admitting: Hematology and Oncology

## 2022-02-07 ENCOUNTER — Inpatient Hospital Stay: Payer: Medicare PPO | Attending: Hematology and Oncology | Admitting: Hematology and Oncology

## 2022-02-07 ENCOUNTER — Other Ambulatory Visit: Payer: Self-pay

## 2022-02-07 ENCOUNTER — Ambulatory Visit: Payer: Medicare PPO

## 2022-02-07 VITALS — BP 128/55 | HR 84 | Temp 97.4°F | Resp 18 | Ht 68.0 in | Wt 169.0 lb

## 2022-02-07 DIAGNOSIS — Z79811 Long term (current) use of aromatase inhibitors: Secondary | ICD-10-CM | POA: Diagnosis not present

## 2022-02-07 DIAGNOSIS — C50312 Malignant neoplasm of lower-inner quadrant of left female breast: Secondary | ICD-10-CM | POA: Insufficient documentation

## 2022-02-07 DIAGNOSIS — Z170 Estrogen receptor positive status [ER+]: Secondary | ICD-10-CM | POA: Insufficient documentation

## 2022-02-07 DIAGNOSIS — Z79899 Other long term (current) drug therapy: Secondary | ICD-10-CM | POA: Insufficient documentation

## 2022-02-07 DIAGNOSIS — Z5112 Encounter for antineoplastic immunotherapy: Secondary | ICD-10-CM | POA: Insufficient documentation

## 2022-02-07 DIAGNOSIS — Z9012 Acquired absence of left breast and nipple: Secondary | ICD-10-CM | POA: Insufficient documentation

## 2022-02-07 MED ORDER — DIPHENHYDRAMINE HCL 25 MG PO CAPS
25.0000 mg | ORAL_CAPSULE | Freq: Once | ORAL | Status: AC
  Administered 2022-02-07: 25 mg via ORAL
  Filled 2022-02-07: qty 1

## 2022-02-07 MED ORDER — TRASTUZUMAB-HYALURONIDASE-OYSK 600-10000 MG-UNT/5ML ~~LOC~~ SOLN
600.0000 mg | Freq: Once | SUBCUTANEOUS | Status: AC
  Administered 2022-02-07: 600 mg via SUBCUTANEOUS
  Filled 2022-02-07: qty 5

## 2022-02-07 NOTE — Assessment & Plan Note (Signed)
07/09/2021:Skin changes in the left breast, screening mammogram revealed thickening of the lower medial quadrant along with enlarged lymph node in the axilla (2 additional lymph nodes) by ultrasound breast tumor measured 3.9 cm, biopsy: Grade 2 IDC with lymphovascular invasion ER 100%, PR 100%, HER2 positive Ki-67 40%, lymph node biopsy positive   08/18/21: Left Mastectomy: Grade 3 IDC with micropapillary features with DCIS 5.8 cm 7/7 LN Positives, involves dermal stroma and lymphatics with focal Extra nodal invasion   09/12/2021: Bone scan focal radiotracer uptake right lateral femoral condyle (knee x-ray recommended) 09/12/2021: CT CAP: Several small left axillary lymph nodes.  No evidence of distant metastatic disease.  2 cm right thyroid nodule ------------------------------------------------------------------------------------------------------------------------  Treatment Plan: 1. adjuvant Herceptin with aromatase inhibitors started 09/14/2021 2.  Adjuvant radiation completed 11/09/2021   Adverse effects: Denies any adverse effects to either Herceptin or letrozole. Right knee swelling: Seeing orthopedics for draining her knee.  Instructed her to stop anastrozole temporarily until the knee pain improves and then she can restart it.   Return to clinic every 3 weeks for Herceptin and every 6 weeks for follow-up with me

## 2022-02-07 NOTE — Patient Instructions (Signed)
[facility] ONCOLOGY  Discharge Instructions: Thank you for choosing [facility] to provide your oncology and hematology care.   If you have a lab appointment with the [facility], please go directly to the [facility] and check in at the registration area.   Wear comfortable clothing and clothing appropriate for easy access to any Portacath or PICC line.   We strive to give you quality time with your provider. You may need to reschedule your appointment if you arrive late (15 or more minutes).  Arriving late affects you and other patients whose appointments are after yours.  Also, if you miss three or more appointments without notifying the office, you may be dismissed from the clinic at the provider's discretion.      For prescription refill requests, have your pharmacy contact our office and allow 72 hours for refills to be completed.    Today you received the following chemotherapy and/or immunotherapy agents: Herceptin Hylecta.       To help prevent nausea and vomiting after your treatment, we encourage you to take your nausea medication as directed.  BELOW ARE SYMPTOMS THAT SHOULD BE REPORTED IMMEDIATELY: *FEVER GREATER THAN 100.4 F (38 C) OR HIGHER *CHILLS OR SWEATING *NAUSEA AND VOMITING THAT IS NOT CONTROLLED WITH YOUR NAUSEA MEDICATION *UNUSUAL SHORTNESS OF BREATH *UNUSUAL BRUISING OR BLEEDING *URINARY PROBLEMS (pain or burning when urinating, or frequent urination) *BOWEL PROBLEMS (unusual diarrhea, constipation, pain near the anus) TENDERNESS IN MOUTH AND THROAT WITH OR WITHOUT PRESENCE OF ULCERS (sore throat, sores in mouth, or a toothache) UNUSUAL RASH, SWELLING OR PAIN  UNUSUAL VAGINAL DISCHARGE OR ITCHING   Items with * indicate a potential emergency and should be followed up as soon as possible or go to the Emergency Department if any problems should occur.  Please show the CHEMOTHERAPY ALERT CARD or IMMUNOTHERAPY ALERT CARD at  check-in to the Emergency Department and triage nurse.  Should you have questions after your visit or need to cancel or reschedule your appointment, please contact [facility]  Dept: [phone]  and follow the prompts.  Office hours are 8:00 a.m. to 4:30 p.m. Monday - Friday. Please note that voicemails left after 4:00 p.m. may not be returned until the following business day.  We are closed weekends and major holidays. You have access to a nurse at all times for urgent questions. Please call the main number to the clinic Dept: [phone] and follow the prompts.   For any non-urgent questions, you may also contact your provider using MyChart. We now offer e-Visits for anyone 38 and older to request care online for non-urgent symptoms. For details visit [email or web address].   Also download the MyChart app! Go to the app store, search "MyChart", open the app, select [facility], and log in with your MyChart username and password.  Masks are optional in the cancer centers. If you would like for your care team to wear a mask while they are taking care of you, please let them know. You may have one support person who is at least 85 years old accompany you for your appointments.

## 2022-02-07 NOTE — Progress Notes (Signed)
Patient Care Team: Lorrene Reid, PA-C as PCP - General (Physician Assistant) [name], MD as Consulting Physician (Cardiology) Sydnee Levans, MD as Referring Physician (Dermatology) Rehabilitation, Patrick Tat, Eustace Quail, DO as Consulting Physician (Neurology) Mauro Kaufmann, RN as Oncology Nurse Navigator Carlynn Spry, Charlott Holler, RN as Oncology Nurse Navigator Coralie Keens, MD as Consulting Physician (General Surgery) Nicholas Lose, MD as Consulting Physician (Hematology and Oncology) Eppie Gibson, MD as Attending Physician (Radiation Oncology) Lorrene Reid, PA-C as Physician Assistant (Physician Assistant)  DIAGNOSIS:  Encounter Diagnosis  Name Primary?   Malignant neoplasm of lower-inner quadrant of left breast in female, estrogen receptor positive ([place]) Yes    SUMMARY OF ONCOLOGIC HISTORY: Oncology History  Malignant neoplasm of lower-inner quadrant of left breast in female, estrogen receptor positive ([place])  07/11/2021 Initial Diagnosis   Skin changes in the left breast, screening mammogram revealed thickening of the lower medial quadrant along with enlarged lymph node in the axilla (2 additional lymph nodes) by ultrasound breast tumor measured 3.9 cm, biopsy: Grade 2 IDC with lymphovascular invasion ER 100%, PR 100%, HER2 positive Ki-67 40%, lymph node biopsy positive   07/13/2021 Cancer Staging   Staging form: Breast, AJCC 8th Edition - Clinical: Stage IB (cT2, cN1, cM0, G2, ER+, PR+, HER2+) - Signed by Nicholas Lose, MD on 07/13/2021 Stage prefix: Initial diagnosis Histologic grading system: 3 grade system    Genetic Testing   [employer] CustomNext Panel was Negative. Report date is 07/24/2021.  The CustomNext panel offered by [employer] includes sequencing, rearrangement, and RNA analysis for the following 41 genes: APC, ATM, BAP1, BARD1, BMPR1A, BRCA1, BRCA2, BRIP1, CDH1, CDK4, CDKN2A, CHEK2, DICER1, MLH1, MSH2, MSH6, MUTYH, NBN, NF1, NTHL1, PALB2,  PMS2, POT1, PTEN, RAD51C, RAD51D, RB1, RECQL, SMAD4, SMARCA4, STK11 and TP53 (sequencing and deletion/duplication); AXIN2, EGFR, HOXB13, MITF, MSH3, POLD1 and POLE (sequencing only); EPCAM and GREM1 (deletion/duplication only).    09/14/2021 - 10/04/2021 Chemotherapy   Patient is on Treatment Plan : BREAST Trastuzumab q21d     10/25/2021 -  Chemotherapy   Patient is on Treatment Plan : BREAST MAINTENANCE Trastuzumab IV (6) or SQ (600) D1 q21d X 11 Cycles       CHIEF COMPLIANT:  Follow-up herceptin and breast swelling  INTERVAL HISTORY: Jamie [name] is a 87 y.o. female is here because of recent diagnosis of left breast. She presents to the clinic today for a follow-up. She reports the swelling in the breast started 2 weeks ago. She says when she touches, it's tender. She says the swelling has subsided in the right knee but still slightly present after she stopped letrozole therapy.   ALLERGIES:  is allergic to flagyl [metronidazole].  MEDICATIONS:  Current Outpatient Medications  Medication Sig Dispense Refill   carbidopa-levodopa (SINEMET IR) 25-100 MG tablet Take 2 tablets by mouth 3 (three) times daily. 540 tablet 2   cholecalciferol (VITAMIN D3) 25 MCG (1000 UNIT) tablet Take 2,000 Units by mouth daily.     Coenzyme Q10 (COQ-10) 100 MG CAPS Take 1 tablet by mouth daily.     escitalopram (LEXAPRO) 20 MG tablet Take 20 mg by mouth daily.     letrozole (FEMARA) 2.5 MG tablet Take 1 tablet (2.5 mg total) by mouth daily. 90 tablet 3   mirtazapine (REMERON) 7.5 MG tablet TAKE 1 TABLET (7.5 MG TOTAL) BY MOUTH AT BEDTIME. 90 tablet 1   Multiple Vitamin (MULTIVITAMIN WITH MINERALS) TABS tablet Take 1 tablet by mouth daily.     Multiple  Vitamins-Minerals (PRESERVISION AREDS 2 PO) Take 1 tablet by mouth in the morning and at bedtime.      NON FORMULARY Herblax as needed     NON FORMULARY Cholesterol Reduction     rosuvastatin (CRESTOR) 5 MG tablet TAKE 1 TABLET BY MOUTH AT BEDTIME 90 tablet 0    Vitamin E 100 units TABS Take 250 mg by mouth. Once a day     No current facility-administered medications for this visit.   Facility-Administered Medications Ordered in Other Visits  Medication Dose Route Frequency Provider Last Rate Last Admin   diphenhydrAMINE (BENADRYL) capsule 25 mg  25 mg Oral Once Nicholas Lose, MD       trastuzumab-hyaluronidase-oysk (HERCEPTIN HYLECTA) 600-10000 MG-UNT/5ML chemo SQ injection 600 mg  600 mg Subcutaneous Once Nicholas Lose, MD        PHYSICAL EXAMINATION: ECOG PERFORMANCE STATUS: 1 - Symptomatic but completely ambulatory  Vitals:   02/07/22 0840  BP: (!) 128/55  Pulse: 84  Resp: 18  Temp: (!) 97.4 F (36.3 C)  SpO2: 100%   Filed Weights   02/07/22 0840  Weight: 169 lb (76.7 kg)   Breast: Left chest wall slight swelling in the anterior axillary fold related to fluid buildup.  There are no lumps or nodules.  LABORATORY DATA:  I have reviewed the data as listed    Latest Ref Rng & Units 09/12/2021    2:21 PM 08/03/2021    8:55 AM 07/13/2021    8:31 AM  CMP  Glucose 70 - 99 mg/dL  113  126   BUN 8 - 27 mg/dL  22  21   Creatinine 0.44 - 1.00 mg/dL 0.70  0.75  0.86   Sodium 134 - 144 mmol/L  141  140   Potassium 3.5 - 5.2 mmol/L  4.4  4.0   Chloride 96 - 106 mmol/L  106  107   CO2 20 - 29 mmol/L  22  28   Calcium 8.7 - 10.3 mg/dL  10.1  11.0   Total Protein 6.0 - 8.5 g/dL  6.6  7.3   Total Bilirubin 0.0 - 1.2 mg/dL  0.5  0.6   Alkaline Phos 44 - 121 IU/L  83  81   AST 0 - 40 IU/L  21  18   ALT 0 - 32 IU/L  8  18     Lab Results  Component Value Date   WBC 6.9 08/03/2021   HGB 13.4 08/03/2021   HCT 39.9 08/03/2021   MCV 92 08/03/2021   PLT 202 08/03/2021   NEUTROABS 4.5 07/13/2021    ASSESSMENT & PLAN:  Malignant neoplasm of lower-inner quadrant of left breast in female, estrogen receptor positive ([place]) 07/09/2021:Skin changes in the left breast, screening mammogram revealed thickening of the lower medial quadrant along with  enlarged lymph node in the axilla (2 additional lymph nodes) by ultrasound breast tumor measured 3.9 cm, biopsy: Grade 2 IDC with lymphovascular invasion ER 100%, PR 100%, HER2 positive Ki-67 40%, lymph node biopsy positive   08/18/21: Left Mastectomy: Grade 3 IDC with micropapillary features with DCIS 5.8 cm 7/7 LN Positives, involves dermal stroma and lymphatics with focal Extra nodal invasion   09/12/2021: Bone scan focal radiotracer uptake right lateral femoral condyle (knee x-ray recommended) 09/12/2021: CT CAP: Several small left axillary lymph nodes.  No evidence of distant metastatic disease.  2 cm right thyroid nodule ------------------------------------------------------------------------------------------------------------------------  Treatment Plan: 1. adjuvant Herceptin with aromatase inhibitors started 09/14/2021 2.  Adjuvant radiation  completed 11/09/2021   Adverse effects: Denies any adverse effects to either Herceptin or letrozole. Right knee swelling: Seeing orthopedics for draining her knee.  Instructed her to stop anastrozole temporarily until the knee pain improves and then she can restart it.   Return to clinic every 3 weeks for Herceptin and every 6 weeks for follow-up with me   No orders of the defined types were placed in this encounter.  The patient has a good understanding of the overall plan. she agrees with it. she will call with any problems that may develop before the next visit here. Total time spent: 30 mins including face to face time and time spent for planning, charting and co-ordination of care   Harriette Ohara, MD 02/07/22    I Gardiner Coins am acting as a Education administrator for [employer]  I have reviewed the above documentation for accuracy and completeness, and I agree with the above.

## 2022-02-16 ENCOUNTER — Ambulatory Visit: Payer: Medicare PPO | Admitting: Nurse Practitioner

## 2022-02-21 NOTE — [facility] Notes (Signed)
Patient Care Team: Lorrene Reid, PA-C as PCP - General (Physician Assistant) [name], MD as Consulting Physician (Cardiology) Sydnee Levans, MD as Referring Physician (Dermatology) Rehabilitation, Kilbourne Tat, Eustace Quail, DO as Consulting Physician (Neurology) Mauro Kaufmann, RN as Oncology Nurse Navigator Carlynn Spry, Charlott Holler, RN as Oncology Nurse Navigator Coralie Keens, MD as Consulting Physician (General Surgery) Nicholas Lose, MD as Consulting Physician (Hematology and Oncology) Eppie Gibson, MD as Attending Physician (Radiation Oncology) Lorrene Reid, PA-C as Physician Assistant (Physician Assistant)  DIAGNOSIS:  Encounter Diagnosis  Name Primary?   Malignant neoplasm of lower-inner quadrant of left breast in female, estrogen receptor positive ([place]) Yes    SUMMARY OF ONCOLOGIC HISTORY: Oncology History  Malignant neoplasm of lower-inner quadrant of left breast in female, estrogen receptor positive ([place])  07/11/2021 Initial Diagnosis   Skin changes in the left breast, screening mammogram revealed thickening of the lower medial quadrant along with enlarged lymph node in the axilla (2 additional lymph nodes) by ultrasound breast tumor measured 3.9 cm, biopsy: Grade 2 IDC with lymphovascular invasion ER 100%, PR 100%, HER2 positive Ki-67 40%, lymph node biopsy positive   07/13/2021 Cancer Staging   Staging form: Breast, AJCC 8th Edition - Clinical: Stage IB (cT2, cN1, cM0, G2, ER+, PR+, HER2+) - Signed by Nicholas Lose, MD on 07/13/2021 Stage prefix: Initial diagnosis Histologic grading system: 3 grade system    Genetic Testing   [facility] CustomNext Panel was Negative. Report date is 07/24/2021.  The CustomNext panel offered by [employer] includes sequencing, rearrangement, and RNA analysis for the following 41 genes: APC, ATM, BAP1, BARD1, BMPR1A, BRCA1, BRCA2, BRIP1, CDH1, CDK4, CDKN2A, CHEK2, DICER1, MLH1, MSH2, MSH6, MUTYH, NBN, NF1, NTHL1, PALB2,  PMS2, POT1, PTEN, RAD51C, RAD51D, RB1, RECQL, SMAD4, SMARCA4, STK11 and TP53 (sequencing and deletion/duplication); AXIN2, EGFR, HOXB13, MITF, MSH3, POLD1 and POLE (sequencing only); EPCAM and GREM1 (deletion/duplication only).    09/14/2021 - 10/04/2021 Chemotherapy   Patient is on Treatment Plan : BREAST Trastuzumab q21d     10/25/2021 -  Chemotherapy   Patient is on Treatment Plan : BREAST MAINTENANCE Trastuzumab IV (6) or SQ (600) D1 q21d X 11 Cycles       CHIEF COMPLIANT: Follow-up herceptin    INTERVAL HISTORY: Jamie [name] is a 88 y.o. female is here because of recent diagnosis of left breast. She presents to the clinic today for a follow-up.  She had severe knee arthritis and went to see orthopedics and they drained the knee.  Since then the knee is doing much better.  She will resume her antiestrogen therapy at this time.   ALLERGIES:  is allergic to flagyl [metronidazole].  MEDICATIONS:  Current Outpatient Medications  Medication Sig Dispense Refill   carbidopa-levodopa (SINEMET IR) 25-100 MG tablet Take 2 tablets by mouth 3 (three) times daily. 540 tablet 2   cholecalciferol (VITAMIN D3) 25 MCG (1000 UNIT) tablet Take 2,000 Units by mouth daily.     Coenzyme Q10 (COQ-10) 100 MG CAPS Take 1 tablet by mouth daily.     escitalopram (LEXAPRO) 20 MG tablet Take 20 mg by mouth daily.     letrozole (FEMARA) 2.5 MG tablet Take 1 tablet (2.5 mg total) by mouth daily. 90 tablet 3   mirtazapine (REMERON) 7.5 MG tablet TAKE 1 TABLET (7.5 MG TOTAL) BY MOUTH AT BEDTIME. 90 tablet 1   Multiple Vitamin (MULTIVITAMIN WITH MINERALS) TABS tablet Take 1 tablet by mouth daily.     Multiple Vitamins-Minerals (PRESERVISION AREDS 2  PO) Take 1 tablet by mouth in the morning and at bedtime.      NON FORMULARY Herblax as needed     NON FORMULARY Cholesterol Reduction     omeprazole (PRILOSEC) 20 MG capsule Take 1 capsule po daily at 5:30 pm 30 capsule 2   rosuvastatin (CRESTOR) 5 MG tablet TAKE 1  TABLET BY MOUTH AT BEDTIME 90 tablet 0   Vitamin E 100 units TABS Take 250 mg by mouth. Once a day     No current facility-administered medications for this visit.    PHYSICAL EXAMINATION: ECOG PERFORMANCE STATUS: 1 - Symptomatic but completely ambulatory  Vitals:   02/28/22 1017  BP: (!) 140/70  Pulse: 88  Temp: 98.1 F (36.7 C)  SpO2: 100%   Filed Weights   02/28/22 1017  Weight: 171 lb (77.6 kg)      LABORATORY DATA:  I have reviewed the data as listed    Latest Ref Rng & Units 09/12/2021    2:21 PM 08/03/2021    8:55 AM 07/13/2021    8:31 AM  CMP  Glucose 70 - 99 mg/dL  113  126   BUN 8 - 27 mg/dL  22  21   Creatinine 0.44 - 1.00 mg/dL 0.70  0.75  0.86   Sodium 134 - 144 mmol/L  141  140   Potassium 3.5 - 5.2 mmol/L  4.4  4.0   Chloride 96 - 106 mmol/L  106  107   CO2 20 - 29 mmol/L  22  28   Calcium 8.7 - 10.3 mg/dL  10.1  11.0   Total Protein 6.0 - 8.5 g/dL  6.6  7.3   Total Bilirubin 0.0 - 1.2 mg/dL  0.5  0.6   Alkaline Phos 44 - 121 IU/L  83  81   AST 0 - 40 IU/L  21  18   ALT 0 - 32 IU/L  8  18     Lab Results  Component Value Date   WBC 6.9 08/03/2021   HGB 13.4 08/03/2021   HCT 39.9 08/03/2021   MCV 92 08/03/2021   PLT 202 08/03/2021   NEUTROABS 4.5 07/13/2021    ASSESSMENT & PLAN:  Malignant neoplasm of lower-inner quadrant of left breast in female, estrogen receptor positive ([place]) 07/09/2021:Skin changes in the left breast, screening mammogram revealed thickening of the lower medial quadrant along with enlarged lymph node in the axilla (2 additional lymph nodes) by ultrasound breast tumor measured 3.9 cm, biopsy: Grade 2 IDC with lymphovascular invasion ER 100%, PR 100%, HER2 positive Ki-67 40%, lymph node biopsy positive   08/18/21: Left Mastectomy: Grade 3 IDC with micropapillary features with DCIS 5.8 cm 7/7 LN Positives, involves dermal stroma and lymphatics with focal Extra nodal invasion   09/12/2021: Bone scan focal radiotracer uptake right  lateral femoral condyle (knee x-ray recommended) 09/12/2021: CT CAP: Several small left axillary lymph nodes.  No evidence of distant metastatic disease.  2 cm right thyroid nodule ------------------------------------------------------------------------------------------------------------------------  Treatment Plan: 1. adjuvant Herceptin with aromatase inhibitors started 09/14/2021 2.  Adjuvant radiation completed 11/09/2021   Adverse effects: Denies any adverse effects to either Herceptin or letrozole. Right knee swelling: The knee was drained and she is feeling much better.  Therefore she will resume letrozole at this time.   Return to clinic every 3 weeks for Herceptin and every 6 weeks for follow-up with me. We decided to finish her Herceptin treatments as of Jul 04, 2022.    No  orders of the defined types were placed in this encounter.  The patient has a good understanding of the overall plan. she agrees with it. she will call with any problems that may develop before the next visit here. Total time spent: 30 mins including face to face time and time spent for planning, charting and co-ordination of care   Harriette Ohara, MD 02/28/22    I Gardiner Coins am acting as a Education administrator for [employer]  I have reviewed the above documentation for accuracy and completeness, and I agree with the above.

## 2022-02-22 ENCOUNTER — Ambulatory Visit (INDEPENDENT_AMBULATORY_CARE_PROVIDER_SITE_OTHER): Payer: Medicare PPO | Admitting: Nurse Practitioner

## 2022-02-22 ENCOUNTER — Encounter: Payer: Self-pay | Admitting: Nurse Practitioner

## 2022-02-22 VITALS — BP 129/78 | HR 83 | Ht 68.0 in | Wt 171.8 lb

## 2022-02-22 DIAGNOSIS — E782 Mixed hyperlipidemia: Secondary | ICD-10-CM | POA: Diagnosis not present

## 2022-02-22 DIAGNOSIS — K219 Gastro-esophageal reflux disease without esophagitis: Secondary | ICD-10-CM

## 2022-02-22 DIAGNOSIS — R12 Heartburn: Secondary | ICD-10-CM

## 2022-02-22 DIAGNOSIS — I10 Essential (primary) hypertension: Secondary | ICD-10-CM

## 2022-02-22 MED ORDER — OMEPRAZOLE 20 MG PO CPDR: DELAYED_RELEASE_CAPSULE | ORAL | 2 refills | Status: DC

## 2022-02-22 NOTE — [facility] Notes (Signed)
Established patient visit   Patient: Jamie [name]   DOB: [birth year]   88 y.o. Female  MRN: [id number] Visit Date: 02/22/2022  Chief Complaint  Patient presents with   Gastroesophageal Reflux   Subjective    Gastroesophageal Reflux She complains of choking, coughing, dysphagia and heartburn. She reports no abdominal pain, no belching, no hoarse voice or no nausea. This is a recurrent problem. The current episode started more than 1 month ago. The problem has been gradually worsening. The heartburn duration is an hour. The heartburn is of severe intensity. The heartburn does not wake her from sleep. The heartburn doesn't change with position. The symptoms are aggravated by certain foods. She has tried a diet change and an antacid for the symptoms. The treatment provided mild relief.      Medications: Outpatient Medications Prior to Visit  Medication Sig   carbidopa-levodopa (SINEMET IR) 25-100 MG tablet Take 2 tablets by mouth 3 (three) times daily.   cholecalciferol (VITAMIN D3) 25 MCG (1000 UNIT) tablet Take 2,000 Units by mouth daily.   Coenzyme Q10 (COQ-10) 100 MG CAPS Take 1 tablet by mouth daily.   escitalopram (LEXAPRO) 20 MG tablet Take 20 mg by mouth daily.   letrozole (FEMARA) 2.5 MG tablet Take 1 tablet (2.5 mg total) by mouth daily.   mirtazapine (REMERON) 7.5 MG tablet TAKE 1 TABLET (7.5 MG TOTAL) BY MOUTH AT BEDTIME.   Multiple Vitamin (MULTIVITAMIN WITH MINERALS) TABS tablet Take 1 tablet by mouth daily.   Multiple Vitamins-Minerals (PRESERVISION AREDS 2 PO) Take 1 tablet by mouth in the morning and at bedtime.    NON FORMULARY Herblax as needed   NON FORMULARY Cholesterol Reduction   rosuvastatin (CRESTOR) 5 MG tablet TAKE 1 TABLET BY MOUTH AT BEDTIME   Vitamin E 100 units TABS Take 250 mg by mouth. Once a day   No facility-administered medications prior to visit.    Review of Systems  HENT:  Negative for hoarse voice.   Respiratory:  Positive for cough and  choking.   Gastrointestinal:  Positive for dysphagia and heartburn. Negative for abdominal pain and nausea.     Objective     Today's Vitals   02/22/22 1505  BP: 129/78  Pulse: 83  SpO2: 97%  Weight: 171 lb 12.8 oz (77.9 kg)  Height: '5\' 8"'$  (1.727 m)   Body mass index is 26.12 kg/m.   Physical Exam Vitals and nursing note reviewed.  Constitutional:      Appearance: Normal appearance. She is well-developed.  HENT:     Head: Normocephalic and atraumatic.     Nose: Nose normal.     Mouth/Throat:     Mouth: Mucous membranes are moist.     Pharynx: Oropharynx is clear.  Eyes:     Extraocular Movements: Extraocular movements intact.     Conjunctiva/sclera: Conjunctivae normal.     Pupils: Pupils are equal, round, and reactive to light.  Neck:     Vascular: No carotid bruit.  Cardiovascular:     Rate and Rhythm: Normal rate and regular rhythm.     Pulses: Normal pulses.     Heart sounds: Normal heart sounds.  Pulmonary:     Effort: Pulmonary effort is normal.     Breath sounds: Normal breath sounds.  Abdominal:     General: Bowel sounds are normal. There is no distension.     Palpations: Abdomen is soft. There is no mass.     Tenderness: There is no abdominal tenderness. There  is no right CVA tenderness, left CVA tenderness, guarding or rebound.     Hernia: No hernia is present.  Musculoskeletal:        General: Normal range of motion.     Cervical back: Normal range of motion and neck supple.  Lymphadenopathy:     Cervical: No cervical adenopathy.  Skin:    General: Skin is warm and dry.     Capillary Refill: Capillary refill takes less than 2 seconds.  Neurological:     General: No focal deficit present.     Mental Status: She is alert and oriented to person, place, and time.  Psychiatric:        Mood and Affect: Mood normal.        Behavior: Behavior normal.        Thought Content: Thought content normal.        Judgment: Judgment normal.       Assessment &  Plan     1. Gastroesophageal reflux disease without esophagitis Add omeprazole 20 mg every day around suppertime. Avoid foods that trigger the symptoms.. recommend she sleep with the head of her bed elevated at 30 degrees.  - omeprazole (PRILOSEC) 20 MG capsule; Take 1 capsule po daily at 5:30 pm  Dispense: 30 capsule; Refill: 2  2. Heartburn Add omeprazole 20 mg every day around suppertime. Avoid foods that trigger the symptoms.. recommend she sleep with the head of her bed elevated at 30 degree - omeprazole (PRILOSEC) 20 MG capsule; Take 1 capsule po daily at 5:30 pm  Dispense: 30 capsule; Refill: 2  3. Essential hypertension Stable. Continue bp medication as prescribed   4. Mixed hyperlipidemia Continue current dose crestor as prescribed    Return for as scheduled.        Ronnell Freshwater, NP  [facility] [phone] (phone) [phone] (fax)  [place]

## 2022-02-28 ENCOUNTER — Other Ambulatory Visit: Payer: Self-pay

## 2022-02-28 ENCOUNTER — Inpatient Hospital Stay: Payer: Medicare PPO | Attending: Hematology and Oncology

## 2022-02-28 ENCOUNTER — Inpatient Hospital Stay: Payer: Medicare PPO | Admitting: Hematology and Oncology

## 2022-02-28 VITALS — BP 140/70 | HR 88 | Temp 98.1°F | Wt 171.0 lb

## 2022-02-28 DIAGNOSIS — Z5112 Encounter for antineoplastic immunotherapy: Secondary | ICD-10-CM | POA: Diagnosis not present

## 2022-02-28 DIAGNOSIS — C50312 Malignant neoplasm of lower-inner quadrant of left female breast: Secondary | ICD-10-CM | POA: Diagnosis not present

## 2022-02-28 DIAGNOSIS — Z170 Estrogen receptor positive status [ER+]: Secondary | ICD-10-CM | POA: Diagnosis not present

## 2022-02-28 MED ORDER — SODIUM CHLORIDE 0.9 % IV SOLN: Freq: Once | INTRAVENOUS | Status: DC

## 2022-02-28 MED ORDER — TRASTUZUMAB-HYALURONIDASE-OYSK 600-10000 MG-UNT/5ML ~~LOC~~ SOLN
600.0000 mg | Freq: Once | SUBCUTANEOUS | Status: AC
  Administered 2022-02-28: 600 mg via SUBCUTANEOUS
  Filled 2022-02-28: qty 5

## 2022-02-28 MED ORDER — DIPHENHYDRAMINE HCL 25 MG PO CAPS
25.0000 mg | ORAL_CAPSULE | Freq: Once | ORAL | Status: AC
  Administered 2022-02-28: 25 mg via ORAL
  Filled 2022-02-28: qty 1

## 2022-02-28 NOTE — Assessment & Plan Note (Signed)
07/09/2021:Skin changes in the left breast, screening mammogram revealed thickening of the lower medial quadrant along with enlarged lymph node in the axilla (2 additional lymph nodes) by ultrasound breast tumor measured 3.9 cm, biopsy: Grade 2 IDC with lymphovascular invasion ER 100%, PR 100%, HER2 positive Ki-67 40%, lymph node biopsy positive   08/18/21: Left Mastectomy: Grade 3 IDC with micropapillary features with DCIS 5.8 cm 7/7 LN Positives, involves dermal stroma and lymphatics with focal Extra nodal invasion   09/12/2021: Bone scan focal radiotracer uptake right lateral femoral condyle (knee x-ray recommended) 09/12/2021: CT CAP: Several small left axillary lymph nodes.  No evidence of distant metastatic disease.  2 cm right thyroid nodule ------------------------------------------------------------------------------------------------------------------------  Treatment Plan: 1. adjuvant Herceptin with aromatase inhibitors started 09/14/2021 2.  Adjuvant radiation completed 11/09/2021   Adverse effects: Denies any adverse effects to either Herceptin or letrozole. Right knee swelling: The knee was drained and she is feeling much better.  Therefore she will resume letrozole at this time.   Return to clinic every 3 weeks for Herceptin and every 6 weeks for follow-up with me. We decided to finish her Herceptin treatments as of Jul 04, 2022. 

## 2022-02-28 NOTE — Patient Instructions (Signed)
[facility]  Discharge Instructions: Thank you for choosing [facility] to provide your oncology and hematology care.   If you have a lab appointment with the [facility], please go directly to the [facility] and check in at the registration area.   Wear comfortable clothing and clothing appropriate for easy access to any Portacath or PICC line.   We strive to give you quality time with your provider. You may need to reschedule your appointment if you arrive late (15 or more minutes).  Arriving late affects you and other patients whose appointments are after yours.  Also, if you miss three or more appointments without notifying the office, you may be dismissed from the clinic at the provider's discretion.      For prescription refill requests, have your pharmacy contact our office and allow 72 hours for refills to be completed.    Today you received the following chemotherapy and/or immunotherapy agents: Herceptin Hylecta      To help prevent nausea and vomiting after your treatment, we encourage you to take your nausea medication as directed.  BELOW ARE SYMPTOMS THAT SHOULD BE REPORTED IMMEDIATELY: *FEVER GREATER THAN 100.4 F (38 C) OR HIGHER *CHILLS OR SWEATING *NAUSEA AND VOMITING THAT IS NOT CONTROLLED WITH YOUR NAUSEA MEDICATION *UNUSUAL SHORTNESS OF BREATH *UNUSUAL BRUISING OR BLEEDING *URINARY PROBLEMS (pain or burning when urinating, or frequent urination) *BOWEL PROBLEMS (unusual diarrhea, constipation, pain near the anus) TENDERNESS IN MOUTH AND THROAT WITH OR WITHOUT PRESENCE OF ULCERS (sore throat, sores in mouth, or a toothache) UNUSUAL RASH, SWELLING OR PAIN  UNUSUAL VAGINAL DISCHARGE OR ITCHING   Items with * indicate a potential emergency and should be followed up as soon as possible or go to the Emergency Department if any problems should occur.  Please show the CHEMOTHERAPY ALERT CARD or IMMUNOTHERAPY ALERT CARD at  check-in to the Emergency Department and triage nurse.  Should you have questions after your visit or need to cancel or reschedule your appointment, please contact [facility]  Dept: [phone]  and follow the prompts.  Office hours are 8:00 a.m. to 4:30 p.m. Monday - Friday. Please note that voicemails left after 4:00 p.m. may not be returned until the following business day.  We are closed weekends and major holidays. You have access to a nurse at all times for urgent questions. Please call the main number to the clinic Dept: [phone] and follow the prompts.   For any non-urgent questions, you may also contact your provider using MyChart. We now offer e-Visits for anyone 64 and older to request care online for non-urgent symptoms. For details visit [email or web address].   Also download the MyChart app! Go to the app store, search "MyChart", open the app, select [facility], and log in with your MyChart username and password.

## 2022-03-02 ENCOUNTER — Telehealth: Payer: Self-pay | Admitting: Hematology and Oncology

## 2022-03-02 NOTE — Telephone Encounter (Signed)
Scheduled appointment per WQ. Talked with the patient relative [name] and she is aware of all made appointments.

## 2022-03-19 DIAGNOSIS — K219 Gastro-esophageal reflux disease without esophagitis: Secondary | ICD-10-CM | POA: Insufficient documentation

## 2022-03-19 DIAGNOSIS — R12 Heartburn: Secondary | ICD-10-CM | POA: Insufficient documentation

## 2022-03-21 ENCOUNTER — Inpatient Hospital Stay: Payer: Medicare PPO

## 2022-03-21 ENCOUNTER — Other Ambulatory Visit: Payer: Self-pay

## 2022-03-21 ENCOUNTER — Encounter: Payer: Self-pay | Admitting: *Deleted

## 2022-03-21 VITALS — BP 149/79 | HR 77 | Temp 98.1°F | Resp 18 | Wt 170.2 lb

## 2022-03-21 DIAGNOSIS — Z5112 Encounter for antineoplastic immunotherapy: Secondary | ICD-10-CM | POA: Diagnosis not present

## 2022-03-21 DIAGNOSIS — Z170 Estrogen receptor positive status [ER+]: Secondary | ICD-10-CM

## 2022-03-21 MED ORDER — TRASTUZUMAB-HYALURONIDASE-OYSK 600-10000 MG-UNT/5ML ~~LOC~~ SOLN
600.0000 mg | Freq: Once | SUBCUTANEOUS | Status: AC
  Administered 2022-03-21: 600 mg via SUBCUTANEOUS
  Filled 2022-03-21: qty 5

## 2022-03-21 MED ORDER — SODIUM CHLORIDE 0.9 % IV SOLN: Freq: Once | INTRAVENOUS | Status: DC

## 2022-03-21 NOTE — [facility] Notes (Signed)
Pt states took her Benadryl '25mg'$  at home this morning around 0840.

## 2022-03-21 NOTE — Patient Instructions (Signed)
[facility]  Discharge Instructions: Thank you for choosing [facility] to provide your oncology and hematology care.   If you have a lab appointment with the Cancer Center, please go directly to the Cancer Center and check in at the registration area.   Wear comfortable clothing and clothing appropriate for easy access to any Portacath or PICC line.   We strive to give you quality time with your provider. You may need to reschedule your appointment if you arrive late (15 or more minutes).  Arriving late affects you and other patients whose appointments are after yours.  Also, if you miss three or more appointments without notifying the office, you may be dismissed from the clinic at the provider's discretion.      For prescription refill requests, have your pharmacy contact our office and allow 72 hours for refills to be completed.    Today you received the following chemotherapy and/or immunotherapy agents: trastuzumab-hyaluronidase      To help prevent nausea and vomiting after your treatment, we encourage you to take your nausea medication as directed.  BELOW ARE SYMPTOMS THAT SHOULD BE REPORTED IMMEDIATELY: *FEVER GREATER THAN 100.4 F (38 C) OR HIGHER *CHILLS OR SWEATING *NAUSEA AND VOMITING THAT IS NOT CONTROLLED WITH YOUR NAUSEA MEDICATION *UNUSUAL SHORTNESS OF BREATH *UNUSUAL BRUISING OR BLEEDING *URINARY PROBLEMS (pain or burning when urinating, or frequent urination) *BOWEL PROBLEMS (unusual diarrhea, constipation, pain near the anus) TENDERNESS IN MOUTH AND THROAT WITH OR WITHOUT PRESENCE OF ULCERS (sore throat, sores in mouth, or a toothache) UNUSUAL RASH, SWELLING OR PAIN  UNUSUAL VAGINAL DISCHARGE OR ITCHING   Items with * indicate a potential emergency and should be followed up as soon as possible or go to the Emergency Department if any problems should occur.  Please show the CHEMOTHERAPY ALERT CARD or IMMUNOTHERAPY  ALERT CARD at check-in to the Emergency Department and triage nurse.  Should you have questions after your visit or need to cancel or reschedule your appointment, please contact [facility]  Dept: [phone]  and follow the prompts.  Office hours are 8:00 a.m. to 4:30 p.m. Monday - Friday. Please note that voicemails left after 4:00 p.m. may not be returned until the following business day.  We are closed weekends and major holidays. You have access to a nurse at all times for urgent questions. Please call the main number to the clinic Dept: [phone] and follow the prompts.   For any non-urgent questions, you may also contact your provider using MyChart. We now offer e-Visits for anyone 18 and older to request care online for non-urgent symptoms. For details visit [email or web address].   Also download the MyChart app! Go to the app store, search "MyChart", open the app, select [facility], and log in with your MyChart username and password.  

## 2022-03-23 ENCOUNTER — Ambulatory Visit: Payer: Medicare PPO | Admitting: Family Medicine

## 2022-03-23 NOTE — Progress Notes (Signed)
[facility] [place] Phone: [phone] Subjective:    I'm seeing this patient by the request  of:  Lorrene Reid, PA-C  CC: Bilateral knee pain  LKG:[id number]  01/24/2022 Chronic problems with worsening symptoms.  Now affecting contralateral knee.  Patient is still undergoing cancer diagnosis.  Patient can have injections If needed follow-up with me again in 8 weeks.  Due to patient's other comorbidities reviewed significant treatment to make different types of medication This is  Update 03/28/2022 RYLIN SAEZ is a 88 y.o. female coming in with complaint of B knee pain. Patient states L knee is worse than R today. Having just a little low back pain sometimes. No other issues.     Past Medical History:  Diagnosis Date   Abnormality of gait 04/18/2017   Degenerative joint disease of knee, right 03/19/2018   Injected April 18, 2018 Approved for Durolane R knee 3.4.20. Patient given another steroid injection Jun 24, 2018.  Repeat injection September 09, 2018 repeat injection February 27, 2019 repeat 6/110/20221 bilateral injections given December 01, 2019 Right knee injected April 20, 2020 monovisc May 19, 2018 Repeat steroid injection given June 16, 2020 repeat injection August 24, 2020   Essential hypertension 08/14/2016   Generalized anxiety disorder 06/27/2017   History of dizziness 06/27/2017   History of hysterectomy for benign disease 06/27/2017   History of vitamin D deficiency 08/22/2017   Leukocytes in urine 08/14/2016   Lumbar facet arthropathy 06/27/2017   Major neurocognitive disorder due to multiple etiologies 09/29/2020   Mixed hyperlipidemia 08/22/2017   Osteoarthritis of knee 09/18/2017   Pancolitis 08/14/2016   Parkinson's disease 03/13/2017   Prediabetes 08/22/2017   Right knee pain    Spinal stenosis of lumbar region    Weakness of both hips 04/18/2017   White coat syndrome without diagnosis of  hypertension 08/22/2017   Past Surgical History:  Procedure Laterality Date   HYSTERECTOMY ABDOMINAL WITH SALPINGECTOMY     MASTECTOMY W/ SENTINEL NODE BIOPSY Left 08/18/2021   Procedure: LEFT MASTECTOMY WITH TARGETED LYMPH NODE DISSECTION;  Surgeon: Coralie Keens, MD;  Location: [facility];  Service: General;  Laterality: Left;   TONSILLECTOMY     Social History   Socioeconomic History   Marital status: Widowed    Spouse name: Not on file   Number of children: 3   Years of education: 12   Highest education level: High school graduate  Occupational History   Not on file  Tobacco Use   Smoking status: Never   Smokeless tobacco: Never  Vaping Use   Vaping Use: Never used  Substance and Sexual Activity   Alcohol use: No    Alcohol/week: 0.0 standard drinks of alcohol   Drug use: No   Sexual activity: Not Currently    Birth control/protection: Surgical  Other Topics Concern   Not on file  Social History Narrative   Pt lives alone in her 1 story home- she has 3 sons, and a high school graduate. She is right handed, she drinks coffee, and one soda a week mostly water   Social Determinants of Health   Financial Resource Strain: Low Risk  (07/13/2021)   Overall Financial Resource Strain (CARDIA)    Difficulty of Paying Living Expenses: Not hard at all  Food Insecurity: No Food Insecurity (07/13/2021)   Hunger Vital Sign    Worried About Running Out of Food in the Last Year: Never true  Ran Out of Food in the Last Year: Never true  Transportation Needs: No Transportation Needs (07/13/2021)   PRAPARE - Hydrologist (Medical): No    Lack of Transportation (Non-Medical): No  Physical Activity: Not on file  Stress: No Stress Concern Present (08/16/2021)   [place]    Feeling of Stress : Not at all  Social Connections: Moderately Integrated (08/16/2021)   Social  Connection and Isolation Panel [NHANES]    Frequency of Communication with Friends and Family: More than three times a week    Frequency of Social Gatherings with Friends and Family: More than three times a week    Attends Religious Services: More than 4 times per year    Active Member of [employer] or Organizations: Yes    Attends Archivist Meetings: More than 4 times per year    Marital Status: Widowed   Allergies  Allergen Reactions   Flagyl [Metronidazole] Rash    Possible cause of rash    Family History  Problem Relation Age of Onset   Breast cancer Mother 38   Melanoma Father        dx. 76s   Lung cancer Father        dx. 95s   Healthy Son      Current Outpatient Medications (Cardiovascular):    rosuvastatin (CRESTOR) 5 MG tablet, TAKE 1 TABLET BY MOUTH AT BEDTIME    Current Outpatient Medications (Hematological):    cyanocobalamin (VITAMIN B12) 500 MCG tablet,   Current Outpatient Medications (Other):    Biotin 1 MG CAPS,    carbidopa-levodopa (SINEMET IR) 25-100 MG tablet, Take 2 tablets by mouth 3 (three) times daily.   cholecalciferol (VITAMIN D3) 25 MCG (1000 UNIT) tablet, Take 2,000 Units by mouth daily.   Coenzyme Q10 (COQ-10) 100 MG CAPS, Take 1 tablet by mouth daily.   escitalopram (LEXAPRO) 20 MG tablet, Take 20 mg by mouth daily.   letrozole (FEMARA) 2.5 MG tablet, Take 1 tablet (2.5 mg total) by mouth daily.   mirtazapine (REMERON) 7.5 MG tablet, TAKE 1 TABLET (7.5 MG TOTAL) BY MOUTH AT BEDTIME.   Multiple Vitamin (MULTIVITAMIN WITH MINERALS) TABS tablet, Take 1 tablet by mouth daily.   Multiple Vitamins-Minerals (PRESERVISION AREDS 2 PO), Take 1 tablet by mouth in the morning and at bedtime.    NON FORMULARY, Herblax as needed   NON FORMULARY, Cholesterol Reduction   omeprazole (PRILOSEC) 20 MG capsule, Take 1 capsule po daily at 5:30 pm   Vitamin E 100 units TABS, Take 250 mg by mouth. Once a day   Reviewed prior external information including  notes and imaging from  primary care provider As well as notes that were available from care everywhere and other healthcare systems.  Past medical history, social, surgical and family history all reviewed in electronic medical record.  No pertanent information unless stated regarding to the chief complaint.   Review of Systems:  No headache, visual changes, nausea, vomiting, diarrhea, constipation, dizziness, abdominal pain, skin rash, fevers, chills, night sweats, weight loss, swollen lymph nodes, body aches, joint swelling, chest pain, shortness of breath, mood changes. POSITIVE muscle aches  Objective  Blood pressure 124/72, pulse 90, height '5\' 8"'$  (1.727 m), weight 170 lb (77.1 kg), SpO2 98 %.   General: No apparent distress alert and oriented x3 mood and affect normal, dressed appropriately.  HEENT: Pupils equal, extraocular movements intact  Respiratory: Patient's speak in  full sentences and does not appear short of breath  Cardiovascular: No lower extremity edema, non tender, no erythema  Patient does have a shuffling gait noted.  Some hypertonicity of the musculature noted. Knee exams bilaterally do show some degenerative disc disease noted.  Crepitus noted.  Hypertonicity noted.  Instability noted with valgus and varus force.  After informed written and verbal consent, patient was seated on exam table. Right knee was prepped with alcohol swab and utilizing anterolateral approach, patient's right knee space was injected with 4:1  marcaine 0.5%: Kenalog '40mg'$ /dL. Patient tolerated the procedure well without immediate complications.  After informed written and verbal consent, patient was seated on exam table. Left knee was prepped with alcohol swab and utilizing anterolateral approach, patient's left knee space was injected with 4:1  marcaine 0.5%: Kenalog '40mg'$ /dL. Patient tolerated the procedure well without immediate complications.   Impression and Recommendations:     The above  documentation has been reviewed and is accurate and complete Lyndal Pulley, DO

## 2022-03-28 ENCOUNTER — Ambulatory Visit: Payer: Medicare PPO | Admitting: Family Medicine

## 2022-03-28 VITALS — BP 124/72 | HR 90 | Ht 68.0 in | Wt 170.0 lb

## 2022-03-28 DIAGNOSIS — M170 Bilateral primary osteoarthritis of knee: Secondary | ICD-10-CM

## 2022-03-28 NOTE — Patient Instructions (Signed)
Happy Birthday See me in 8-10 weeks

## 2022-03-28 NOTE — Assessment & Plan Note (Signed)
Chronic, with worsening symptoms.  Due to comorbidities including Parkinson's disease she is not a candidate for knee replacement.  Bilateral injections given again today.  Tolerated the procedure well, discussed icing regimen and home exercises, discussed which activities to do.  Patient was with family member.  Discussed that while worsening symptoms to seek medical attention.  Follow-up with me again in 8 to 10 weeks.

## 2022-04-10 NOTE — Progress Notes (Signed)
Patient Care Team: Lorrene Reid, PA-C (Inactive) as PCP - General (Physician Assistant) [name], MD as Consulting Physician (Cardiology) Sydnee Levans, MD as Referring Physician (Dermatology) Rehabilitation, Hallowell Tat, Eustace Quail, DO as Consulting Physician (Neurology) Mauro Kaufmann, RN as Oncology Nurse Navigator Carlynn Spry, Charlott Holler, RN as Oncology Nurse Navigator Coralie Keens, MD as Consulting Physician (General Surgery) Nicholas Lose, MD as Consulting Physician (Hematology and Oncology) Eppie Gibson, MD as Attending Physician (Radiation Oncology) Lorrene Reid, PA-C (Inactive) as Physician Assistant (Physician Assistant)  DIAGNOSIS: No diagnosis found.  SUMMARY OF ONCOLOGIC HISTORY: Oncology History  Malignant neoplasm of lower-inner quadrant of left breast in female, estrogen receptor positive ([place])  07/11/2021 Initial Diagnosis   Skin changes in the left breast, screening mammogram revealed thickening of the lower medial quadrant along with enlarged lymph node in the axilla (2 additional lymph nodes) by ultrasound breast tumor measured 3.9 cm, biopsy: Grade 2 IDC with lymphovascular invasion ER 100%, PR 100%, HER2 positive Ki-67 40%, lymph node biopsy positive   07/13/2021 Cancer Staging   Staging form: Breast, AJCC 8th Edition - Clinical: Stage IB (cT2, cN1, cM0, G2, ER+, PR+, HER2+) - Signed by Nicholas Lose, MD on 07/13/2021 Stage prefix: Initial diagnosis Histologic grading system: 3 grade system    Genetic Testing   [facility] CustomNext Panel was Negative. Report date is 07/24/2021.  The CustomNext panel offered by [employer] includes sequencing, rearrangement, and RNA analysis for the following 41 genes: APC, ATM, BAP1, BARD1, BMPR1A, BRCA1, BRCA2, BRIP1, CDH1, CDK4, CDKN2A, CHEK2, DICER1, MLH1, MSH2, MSH6, MUTYH, NBN, NF1, NTHL1, PALB2, PMS2, POT1, PTEN, RAD51C, RAD51D, RB1, RECQL, SMAD4, SMARCA4, STK11 and TP53 (sequencing and  deletion/duplication); AXIN2, EGFR, HOXB13, MITF, MSH3, POLD1 and POLE (sequencing only); EPCAM and GREM1 (deletion/duplication only).    09/14/2021 - 10/04/2021 Chemotherapy   Patient is on Treatment Plan : BREAST Trastuzumab q21d     10/25/2021 -  Chemotherapy   Patient is on Treatment Plan : BREAST MAINTENANCE Trastuzumab IV (6) or SQ (600) D1 q21d X 11 Cycles       CHIEF COMPLIANT:   INTERVAL HISTORY: HONEY DOROTHY is a   ALLERGIES:  is allergic to flagyl [metronidazole].  MEDICATIONS:  Current Outpatient Medications  Medication Sig Dispense Refill   Biotin 1 MG CAPS      carbidopa-levodopa (SINEMET IR) 25-100 MG tablet Take 2 tablets by mouth 3 (three) times daily. 540 tablet 2   cholecalciferol (VITAMIN D3) 25 MCG (1000 UNIT) tablet Take 2,000 Units by mouth daily.     Coenzyme Q10 (COQ-10) 100 MG CAPS Take 1 tablet by mouth daily.     cyanocobalamin (VITAMIN B12) 500 MCG tablet      escitalopram (LEXAPRO) 20 MG tablet Take 20 mg by mouth daily.     letrozole (FEMARA) 2.5 MG tablet Take 1 tablet (2.5 mg total) by mouth daily. 90 tablet 3   mirtazapine (REMERON) 7.5 MG tablet TAKE 1 TABLET (7.5 MG TOTAL) BY MOUTH AT BEDTIME. 90 tablet 1   Multiple Vitamin (MULTIVITAMIN WITH MINERALS) TABS tablet Take 1 tablet by mouth daily.     Multiple Vitamins-Minerals (PRESERVISION AREDS 2 PO) Take 1 tablet by mouth in the morning and at bedtime.      NON FORMULARY Herblax as needed     NON FORMULARY Cholesterol Reduction     omeprazole (PRILOSEC) 20 MG capsule Take 1 capsule po daily at 5:30 pm 30 capsule 2   rosuvastatin (CRESTOR) 5 MG tablet TAKE 1  TABLET BY MOUTH AT BEDTIME 90 tablet 0   Vitamin E 100 units TABS Take 250 mg by mouth. Once a day     No current facility-administered medications for this visit.    PHYSICAL EXAMINATION: ECOG PERFORMANCE STATUS: {CHL ONC ECOG PS:[phone]}  There were no vitals filed for this visit. There were no vitals filed for this  visit.  BREAST:*** No palpable masses or nodules in either right or left breasts. No palpable axillary supraclavicular or infraclavicular adenopathy no breast tenderness or nipple discharge. (exam performed in the presence of a chaperone)  LABORATORY DATA:  I have reviewed the data as listed    Latest Ref Rng & Units 09/12/2021    2:21 PM 08/03/2021    8:55 AM 07/13/2021    8:31 AM  CMP  Glucose 70 - 99 mg/dL  113  126   BUN 8 - 27 mg/dL  22  21   Creatinine 0.44 - 1.00 mg/dL 0.70  0.75  0.86   Sodium 134 - 144 mmol/L  141  140   Potassium 3.5 - 5.2 mmol/L  4.4  4.0   Chloride 96 - 106 mmol/L  106  107   CO2 20 - 29 mmol/L  22  28   Calcium 8.7 - 10.3 mg/dL  10.1  11.0   Total Protein 6.0 - 8.5 g/dL  6.6  7.3   Total Bilirubin 0.0 - 1.2 mg/dL  0.5  0.6   Alkaline Phos 44 - 121 IU/L  83  81   AST 0 - 40 IU/L  21  18   ALT 0 - 32 IU/L  8  18     Lab Results  Component Value Date   WBC 6.9 08/03/2021   HGB 13.4 08/03/2021   HCT 39.9 08/03/2021   MCV 92 08/03/2021   PLT 202 08/03/2021   NEUTROABS 4.5 07/13/2021    ASSESSMENT & PLAN:  No problem-specific Assessment & Plan notes found for this encounter.    No orders of the defined types were placed in this encounter.  The patient has a good understanding of the overall plan. she agrees with it. she will call with any problems that may develop before the next visit here. Total time spent: 30 mins including face to face time and time spent for planning, charting and co-ordination of care   Suzzette Righter, [place] 04/10/22    I Gardiner Coins am acting as a Education administrator for [employer]  ***

## 2022-04-10 NOTE — Assessment & Plan Note (Signed)
07/09/2021:Skin changes in the left breast, screening mammogram revealed thickening of the lower medial quadrant along with enlarged lymph node in the axilla (2 additional lymph nodes) by ultrasound breast tumor measured 3.9 cm, biopsy: Grade 2 IDC with lymphovascular invasion ER 100%, PR 100%, HER2 positive Ki-67 40%, lymph node biopsy positive   08/18/21: Left Mastectomy: Grade 3 IDC with micropapillary features with DCIS 5.8 cm 7/7 LN Positives, involves dermal stroma and lymphatics with focal Extra nodal invasion   09/12/2021: Bone scan focal radiotracer uptake right lateral femoral condyle (knee x-ray recommended) 09/12/2021: CT CAP: Several small left axillary lymph nodes.  No evidence of distant metastatic disease.  2 cm right thyroid nodule ------------------------------------------------------------------------------------------------------------------------  Treatment Plan: 1. adjuvant Herceptin with aromatase inhibitors started 09/14/2021 2.  Adjuvant radiation completed 11/09/2021   Adverse effects: Denies any adverse effects to either Herceptin or letrozole. Right knee swelling: The knee was drained and she is feeling much better.  Therefore she will resume letrozole at this time.   Return to clinic every 3 weeks for Herceptin and every 6 weeks for follow-up with me. We decided to finish her Herceptin treatments as of Jul 04, 2022.

## 2022-04-11 ENCOUNTER — Other Ambulatory Visit: Payer: Self-pay

## 2022-04-11 ENCOUNTER — Inpatient Hospital Stay: Payer: Medicare PPO | Attending: Hematology and Oncology

## 2022-04-11 ENCOUNTER — Inpatient Hospital Stay: Payer: Medicare PPO | Admitting: Hematology and Oncology

## 2022-04-11 VITALS — BP 144/68 | HR 87 | Temp 97.0°F | Resp 18 | Wt 170.4 lb

## 2022-04-11 DIAGNOSIS — Z79811 Long term (current) use of aromatase inhibitors: Secondary | ICD-10-CM | POA: Insufficient documentation

## 2022-04-11 DIAGNOSIS — Z170 Estrogen receptor positive status [ER+]: Secondary | ICD-10-CM

## 2022-04-11 DIAGNOSIS — Z5112 Encounter for antineoplastic immunotherapy: Secondary | ICD-10-CM | POA: Diagnosis present

## 2022-04-11 DIAGNOSIS — Z79899 Other long term (current) drug therapy: Secondary | ICD-10-CM | POA: Insufficient documentation

## 2022-04-11 DIAGNOSIS — C50312 Malignant neoplasm of lower-inner quadrant of left female breast: Secondary | ICD-10-CM

## 2022-04-11 DIAGNOSIS — I427 Cardiomyopathy due to drug and external agent: Secondary | ICD-10-CM | POA: Diagnosis not present

## 2022-04-11 MED ORDER — TRASTUZUMAB-HYALURONIDASE-OYSK 600-10000 MG-UNT/5ML ~~LOC~~ SOLN
600.0000 mg | Freq: Once | SUBCUTANEOUS | Status: AC
  Administered 2022-04-11: 600 mg via SUBCUTANEOUS
  Filled 2022-04-11: qty 5

## 2022-04-11 MED ORDER — DIPHENHYDRAMINE HCL 25 MG PO CAPS: 25.0000 mg | ORAL_CAPSULE | Freq: Once | ORAL | Status: DC

## 2022-04-11 NOTE — Patient Instructions (Signed)
[facility]  Discharge Instructions: Thank you for choosing [facility] to provide your oncology and hematology care.   If you have a lab appointment with the [facility], please go directly to the [facility] and check in at the registration area.   Wear comfortable clothing and clothing appropriate for easy access to any Portacath or PICC line.   We strive to give you quality time with your provider. You may need to reschedule your appointment if you arrive late (15 or more minutes).  Arriving late affects you and other patients whose appointments are after yours.  Also, if you miss three or more appointments without notifying the office, you may be dismissed from the clinic at the provider's discretion.      For prescription refill requests, have your pharmacy contact our office and allow 72 hours for refills to be completed.    Today you received the following chemotherapy and/or immunotherapy agents: trastuzumab-hyaluronidase      To help prevent nausea and vomiting after your treatment, we encourage you to take your nausea medication as directed.  BELOW ARE SYMPTOMS THAT SHOULD BE REPORTED IMMEDIATELY: *FEVER GREATER THAN 100.4 F (38 C) OR HIGHER *CHILLS OR SWEATING *NAUSEA AND VOMITING THAT IS NOT CONTROLLED WITH YOUR NAUSEA MEDICATION *UNUSUAL SHORTNESS OF BREATH *UNUSUAL BRUISING OR BLEEDING *URINARY PROBLEMS (pain or burning when urinating, or frequent urination) *BOWEL PROBLEMS (unusual diarrhea, constipation, pain near the anus) TENDERNESS IN MOUTH AND THROAT WITH OR WITHOUT PRESENCE OF ULCERS (sore throat, sores in mouth, or a toothache) UNUSUAL RASH, SWELLING OR PAIN  UNUSUAL VAGINAL DISCHARGE OR ITCHING   Items with * indicate a potential emergency and should be followed up as soon as possible or go to the Emergency Department if any problems should occur.  Please show the CHEMOTHERAPY ALERT CARD or IMMUNOTHERAPY  ALERT CARD at check-in to the Emergency Department and triage nurse.  Should you have questions after your visit or need to cancel or reschedule your appointment, please contact [facility]  Dept: [phone]  and follow the prompts.  Office hours are 8:00 a.m. to 4:30 p.m. Monday - Friday. Please note that voicemails left after 4:00 p.m. may not be returned until the following business day.  We are closed weekends and major holidays. You have access to a nurse at all times for urgent questions. Please call the main number to the clinic Dept: [phone] and follow the prompts.   For any non-urgent questions, you may also contact your provider using MyChart. We now offer e-Visits for anyone 33 and older to request care online for non-urgent symptoms. For details visit [email or web address].   Also download the MyChart app! Go to the app store, search "MyChart", open the app, select [facility], and log in with your MyChart username and password.

## 2022-04-13 ENCOUNTER — Telehealth: Payer: Self-pay | Admitting: Hematology and Oncology

## 2022-04-13 NOTE — Telephone Encounter (Signed)
Scheduled appointments per WQ. Talked with the patients relative [name] and she is aware of the made appointments.

## 2022-04-26 ENCOUNTER — Ambulatory Visit (HOSPITAL_COMMUNITY)
Admission: RE | Admit: 2022-04-26 | Discharge: 2022-04-26 | Disposition: A | Discharge status: Home / Self Care | Payer: Medicare PPO | Source: Ambulatory Visit | Attending: Hematology and Oncology | Admitting: Hematology and Oncology

## 2022-04-26 DIAGNOSIS — C50312 Malignant neoplasm of lower-inner quadrant of left female breast: Secondary | ICD-10-CM

## 2022-04-26 DIAGNOSIS — Z01818 Encounter for other preprocedural examination: Secondary | ICD-10-CM | POA: Insufficient documentation

## 2022-04-26 DIAGNOSIS — Z170 Estrogen receptor positive status [ER+]: Secondary | ICD-10-CM | POA: Diagnosis not present

## 2022-04-26 DIAGNOSIS — Z0189 Encounter for other specified special examinations: Secondary | ICD-10-CM

## 2022-04-26 DIAGNOSIS — I10 Essential (primary) hypertension: Secondary | ICD-10-CM | POA: Insufficient documentation

## 2022-04-26 DIAGNOSIS — E785 Hyperlipidemia, unspecified: Secondary | ICD-10-CM | POA: Insufficient documentation

## 2022-04-26 DIAGNOSIS — I427 Cardiomyopathy due to drug and external agent: Secondary | ICD-10-CM | POA: Diagnosis not present

## 2022-04-26 LAB — ECHOCARDIOGRAM COMPLETE
Area-P 1/2: 3.66 cm2
Calc EF: 60.2 %
S' Lateral: 2.5 cm
Single Plane A2C EF: 58.4 %
Single Plane A4C EF: 61.4 %

## 2022-04-26 NOTE — Progress Notes (Signed)
Echocardiogram 2D Echocardiogram has been performed.  Frances Furbish 04/26/2022, 11:47 AM

## 2022-04-27 ENCOUNTER — Ambulatory Visit: Payer: Medicare PPO | Admitting: Nurse Practitioner

## 2022-05-01 ENCOUNTER — Ambulatory Visit: Payer: Medicare PPO | Attending: Surgery

## 2022-05-01 VITALS — Wt 171.2 lb

## 2022-05-01 DIAGNOSIS — Z483 Aftercare following surgery for neoplasm: Secondary | ICD-10-CM | POA: Insufficient documentation

## 2022-05-01 NOTE — Patient Instructions (Signed)
PLEASE KEEP YOUR COMPRESSION GARMENT ON DURING THE DAY TO GET THE BEST SWELLING REDUCTION. HERE ARE SOME ADDITIONAL TIPS: Do not sleep in your garment. If you have pain or notice swelling in your hand or at the top of your shoulder, call your therapist. This may be a sign that you need a different garment. 3.  Take good care of your garment so it lasts longer: Follow washing instructions on your garment label or box. Wash periodically using a mild detergent in warm water.  Do not use fabric softener or bleach.  Place garment in a mesh lingerie bag and use the gentle cycle of the washing machine or hand wash. Tumble dry low or lay flat to dry. TAKE CARE OF YOUR SKIN Apply a low pH moisturizing lotion to your skin daily Avoid scratching your skin Treat skin irritations quickly  Know the 5 warning signs of infection: redness, pain, warmth to touch, fever and increased swelling.  Call your physician immediately if you notice any of these signs of a possible infection.    Cancer Rehab [phone] 

## 2022-05-01 NOTE — Therapy (Signed)
OUTPATIENT PHYSICAL THERAPY SOZO SCREENING NOTE   Patient Name: Jamie [name] MRN: [id number] DOB:1934-07-31, 88 y.o., female Today's Date: 05/01/2022  PCP: Lorrene Reid, PA-C (Inactive) REFERRING PROVIDER: Coralie Keens, MD   PT End of Session - 05/01/22 1007     Visit Number 3   # unchnaged due to screen only   PT Start Time 1000    PT Stop Time 1025    PT Time Calculation (min) 25 min    Activity Tolerance Patient tolerated treatment well    Behavior During Therapy [facility] for tasks assessed/performed             Past Medical History:  Diagnosis Date   Abnormality of gait 04/18/2017   Degenerative joint disease of knee, right 03/19/2018   Injected April 18, 2018 Approved for Durolane R knee 3.4.20. Patient given another steroid injection Jun 24, 2018.  Repeat injection September 09, 2018 repeat injection February 27, 2019 repeat 6/110/20221 bilateral injections given December 01, 2019 Right knee injected April 20, 2020 monovisc May 19, 2018 Repeat steroid injection given June 16, 2020 repeat injection August 24, 2020   Essential hypertension 08/14/2016   Generalized anxiety disorder 06/27/2017   History of dizziness 06/27/2017   History of hysterectomy for benign disease 06/27/2017   History of vitamin D deficiency 08/22/2017   Leukocytes in urine 08/14/2016   Lumbar facet arthropathy 06/27/2017   Major neurocognitive disorder due to multiple etiologies 09/29/2020   Mixed hyperlipidemia 08/22/2017   Osteoarthritis of knee 09/18/2017   Pancolitis 08/14/2016   Parkinson's disease 03/13/2017   Prediabetes 08/22/2017   Right knee pain    Spinal stenosis of lumbar region    Weakness of both hips 04/18/2017   White coat syndrome without diagnosis of hypertension 08/22/2017   Past Surgical History:  Procedure Laterality Date   HYSTERECTOMY ABDOMINAL WITH SALPINGECTOMY     MASTECTOMY W/ SENTINEL NODE BIOPSY Left 08/18/2021   Procedure: LEFT MASTECTOMY WITH TARGETED LYMPH  NODE DISSECTION;  Surgeon: Coralie Keens, MD;  Location: [place];  Service: General;  Laterality: Left;   TONSILLECTOMY     Patient Active Problem List   Diagnosis Date Noted   Gastroesophageal reflux disease without esophagitis 03/19/2022   Heartburn 03/19/2022   S/P left mastectomy 08/18/2021   Genetic testing 07/25/2021   Family history of breast cancer 07/14/2021   Malignant neoplasm of lower-inner quadrant of left breast in female, estrogen receptor positive ([place]) 07/11/2021   Mild episode of recurrent major depressive disorder ([place]) 04/06/2021   Age-related osteoporosis without current pathological fracture 04/06/2021   Greater trochanteric bursitis of right hip 01/21/2021   Major neurocognitive disorder due to multiple etiologies 09/29/2020   Right knee pain 05/07/2018   Osteoarthritis of knees, bilateral 03/19/2018   Osteoarthritis of knee 09/18/2017   Mixed hyperlipidemia 08/22/2017   Prediabetes 08/22/2017   White coat syndrome without diagnosis of hypertension 08/22/2017   History of vitamin D deficiency 08/22/2017   Lumbar facet arthropathy 06/27/2017   History of hysterectomy for benign disease 06/27/2017   Generalized anxiety disorder 06/27/2017   History of dizziness 06/27/2017   Other fatigue 06/27/2017   Overweight (BMI 25.0-29.9) 06/27/2017   Weakness of both hips 04/18/2017   Abnormality of gait 04/18/2017   Spinal stenosis of lumbar region 04/18/2017   Parkinson's disease 03/13/2017   Pancolitis 08/14/2016   Essential hypertension 08/14/2016   Leukocytes in urine 08/14/2016    REFERRING DIAG: left breast cancer at risk for lymphedema  THERAPY DIAG:  Aftercare following surgery for neoplasm  PERTINENT HISTORY: Patient was diagnosed on 06/27/2021 with left grade 2 invasive ductal carcinoma breast cancer. She underwent a left mastectomy and targeted axillary lymph node dissection (7/7 nodes positive) on 08/18/2021. It is triple positive with  a Ki67 of 40%. Patient has cognitive deficits effecting memory and was diagnosed with Parkinson's in 2020. She is triple positive with a Ki67 of 40%   PRECAUTIONS: left UE Lymphedema risk, None  SUBJECTIVE: Pt returns for her 3 month L-Dex screen.   PAIN:  Are you having pain? No  SOZO SCREENING:  Patient was assessed today using the SOZO machine to determine the lymphedema index score. This was compared to her baseline score. It was determined that she is NOT within the recommended range when compared to her baseline and so she was fitted for a compression garment while in the clinic today (size III). It is recommended she return in 1 month to be reassessed. If she continues to measure outside the recommended range, physical therapy treatment will be recommended at that time and a referral requested.   L-DEX FLOWSHEETS - 05/01/22 1000       L-DEX LYMPHEDEMA SCREENING   Measurement Type Unilateral    L-DEX MEASUREMENT EXTREMITY Upper Extremity    POSITION  Standing    DOMINANT SIDE Right    At Risk Side Left    BASELINE SCORE (UNILATERAL) 0.1    L-DEX SCORE (UNILATERAL) 7    VALUE CHANGE (UNILAT) 6.9                 Otelia Limes, PTA 05/01/2022, 10:35 AM    PLEASE KEEP YOUR COMPRESSION GARMENT ON DURING THE DAY TO GET THE BEST SWELLING REDUCTION. HERE ARE SOME ADDITIONAL TIPS: Do not sleep in your garment. If you have pain or notice swelling in your hand or at the top of your shoulder, call your therapist. This may be a sign that you need a different garment. 3.  Take good care of your garment so it lasts longer: Follow washing instructions on your garment label or box. Wash periodically using a mild detergent in warm water.  Do not use fabric softener or bleach.  Place garment in a mesh lingerie bag and use the gentle cycle of the washing machine or hand wash. Tumble dry low or lay flat to dry. TAKE CARE OF YOUR SKIN Apply a low pH moisturizing lotion to  your skin daily Avoid scratching your skin Treat skin irritations quickly  Know the 5 warning signs of infection: redness, pain, warmth to touch, fever and increased swelling.  Call your physician immediately if you notice any of these signs of a possible infection.   Cancer Rehab [phone]

## 2022-05-02 ENCOUNTER — Inpatient Hospital Stay: Payer: Medicare PPO | Attending: Hematology and Oncology

## 2022-05-02 ENCOUNTER — Other Ambulatory Visit: Payer: Self-pay

## 2022-05-02 VITALS — BP 153/66 | HR 80 | Temp 97.8°F | Resp 18 | Wt 171.5 lb

## 2022-05-02 DIAGNOSIS — C50312 Malignant neoplasm of lower-inner quadrant of left female breast: Secondary | ICD-10-CM | POA: Diagnosis not present

## 2022-05-02 DIAGNOSIS — Z5112 Encounter for antineoplastic immunotherapy: Secondary | ICD-10-CM | POA: Insufficient documentation

## 2022-05-02 DIAGNOSIS — Z170 Estrogen receptor positive status [ER+]: Secondary | ICD-10-CM

## 2022-05-02 MED ORDER — TRASTUZUMAB-HYALURONIDASE-OYSK 600-10000 MG-UNT/5ML ~~LOC~~ SOLN
600.0000 mg | Freq: Once | SUBCUTANEOUS | Status: AC
  Administered 2022-05-02: 600 mg via SUBCUTANEOUS
  Filled 2022-05-02: qty 5

## 2022-05-02 MED ORDER — DIPHENHYDRAMINE HCL 25 MG PO CAPS: 25.0000 mg | ORAL_CAPSULE | Freq: Once | ORAL | Status: DC

## 2022-05-02 MED ORDER — SODIUM CHLORIDE 0.9 % IV SOLN: Freq: Once | INTRAVENOUS | Status: DC

## 2022-05-02 NOTE — Addendum Note (Signed)
Addended by: Tora Kindred on: 05/02/2022 09:21 AM   Modules accepted: Orders

## 2022-05-02 NOTE — Patient Instructions (Signed)
[facility]  Discharge Instructions: Thank you for choosing [facility] to provide your oncology and hematology care.   If you have a lab appointment with the Cancer Center, please go directly to the Cancer Center and check in at the registration area.   Wear comfortable clothing and clothing appropriate for easy access to any Portacath or PICC line.   We strive to give you quality time with your provider. You may need to reschedule your appointment if you arrive late (15 or more minutes).  Arriving late affects you and other patients whose appointments are after yours.  Also, if you miss three or more appointments without notifying the office, you may be dismissed from the clinic at the provider's discretion.      For prescription refill requests, have your pharmacy contact our office and allow 72 hours for refills to be completed.    Today you received the following chemotherapy and/or immunotherapy agents: trastuzumab-hyaluronidase      To help prevent nausea and vomiting after your treatment, we encourage you to take your nausea medication as directed.  BELOW ARE SYMPTOMS THAT SHOULD BE REPORTED IMMEDIATELY: *FEVER GREATER THAN 100.4 F (38 C) OR HIGHER *CHILLS OR SWEATING *NAUSEA AND VOMITING THAT IS NOT CONTROLLED WITH YOUR NAUSEA MEDICATION *UNUSUAL SHORTNESS OF BREATH *UNUSUAL BRUISING OR BLEEDING *URINARY PROBLEMS (pain or burning when urinating, or frequent urination) *BOWEL PROBLEMS (unusual diarrhea, constipation, pain near the anus) TENDERNESS IN MOUTH AND THROAT WITH OR WITHOUT PRESENCE OF ULCERS (sore throat, sores in mouth, or a toothache) UNUSUAL RASH, SWELLING OR PAIN  UNUSUAL VAGINAL DISCHARGE OR ITCHING   Items with * indicate a potential emergency and should be followed up as soon as possible or go to the Emergency Department if any problems should occur.  Please show the CHEMOTHERAPY ALERT CARD or IMMUNOTHERAPY  ALERT CARD at check-in to the Emergency Department and triage nurse.  Should you have questions after your visit or need to cancel or reschedule your appointment, please contact [facility]  Dept: [phone]  and follow the prompts.  Office hours are 8:00 a.m. to 4:30 p.m. Monday - Friday. Please note that voicemails left after 4:00 p.m. may not be returned until the following business day.  We are closed weekends and major holidays. You have access to a nurse at all times for urgent questions. Please call the main number to the clinic Dept: [phone] and follow the prompts.   For any non-urgent questions, you may also contact your provider using MyChart. We now offer e-Visits for anyone 18 and older to request care online for non-urgent symptoms. For details visit [email or web address].   Also download the MyChart app! Go to the app store, search "MyChart", open the app, select [place], and log in with your MyChart username and password.  

## 2022-05-04 ENCOUNTER — Ambulatory Visit: Payer: Medicare PPO | Admitting: Nurse Practitioner

## 2022-05-04 ENCOUNTER — Encounter: Payer: Self-pay | Admitting: Nurse Practitioner

## 2022-05-04 VITALS — BP 113/70 | HR 87 | Ht 68.0 in | Wt 170.4 lb

## 2022-05-04 DIAGNOSIS — I10 Essential (primary) hypertension: Secondary | ICD-10-CM | POA: Diagnosis not present

## 2022-05-04 DIAGNOSIS — E782 Mixed hyperlipidemia: Secondary | ICD-10-CM

## 2022-05-04 DIAGNOSIS — F330 Major depressive disorder, recurrent, mild: Secondary | ICD-10-CM

## 2022-05-04 DIAGNOSIS — C50312 Malignant neoplasm of lower-inner quadrant of left female breast: Secondary | ICD-10-CM

## 2022-05-04 DIAGNOSIS — G20B1 Parkinson's disease with dyskinesia, without mention of fluctuations: Secondary | ICD-10-CM

## 2022-05-04 DIAGNOSIS — Z170 Estrogen receptor positive status [ER+]: Secondary | ICD-10-CM

## 2022-05-04 MED ORDER — MIRTAZAPINE 15 MG PO TABS: 15.0000 mg | ORAL_TABLET | Freq: Every day | ORAL | 1 refills | Status: DC

## 2022-05-04 MED ORDER — DONEPEZIL HCL 5 MG PO TABS: 5.0000 mg | ORAL_TABLET | Freq: Every day | ORAL | 1 refills | Status: DC

## 2022-05-04 MED ORDER — ROSUVASTATIN CALCIUM 5 MG PO TABS: 5.0000 mg | ORAL_TABLET | Freq: Every day | ORAL | 3 refills | Status: DC

## 2022-05-04 NOTE — Progress Notes (Signed)
Established patient visit   Patient: Jamie [name]   DOB: [birth year]   88 y.o. Female  MRN: [id number] Visit Date: 05/04/2022   Chief Complaint  Patient presents with   Medical Management of Chronic Issues   Subjective    HPI  Follow up  Htn - -generally well controlled and very good during today's visit.  HLD Mood -taking only remeron and patient is having some increased depression  -new depression score -worsening short term memory.  -oncologist recommended they talk to PCP.  -patient does see neurology for parkinson's disease  Breast cancer  -routinely sees oncology  Medications: Outpatient Medications Prior to Visit  Medication Sig   Biotin 1 MG CAPS    carbidopa-levodopa (SINEMET IR) 25-100 MG tablet Take 2 tablets by mouth 3 (three) times daily.   cholecalciferol (VITAMIN D3) 25 MCG (1000 UNIT) tablet Take 2,000 Units by mouth daily.   cyanocobalamin (VITAMIN B12) 500 MCG tablet    letrozole (FEMARA) 2.5 MG tablet Take 1 tablet (2.5 mg total) by mouth daily.   Multiple Vitamins-Minerals (PRESERVISION AREDS 2 PO) Take 1 tablet by mouth in the morning and at bedtime.    NON FORMULARY Herblax as needed   NON FORMULARY Cholesterol Reduction   omeprazole (PRILOSEC) 20 MG capsule Take 1 capsule po daily at 5:30 pm   Vitamin E 100 units TABS Take 250 mg by mouth. Once a day   [DISCONTINUED] Coenzyme Q10 (COQ-10) 100 MG CAPS Take 1 tablet by mouth daily.   [DISCONTINUED] ergocalciferol (VITAMIN D2) 1.25 MG (50000 UT) capsule    [DISCONTINUED] escitalopram (LEXAPRO) 20 MG tablet Take 20 mg by mouth daily.   [DISCONTINUED] LORazepam (ATIVAN) 0.5 MG tablet    [DISCONTINUED] mirtazapine (REMERON) 7.5 MG tablet TAKE 1 TABLET (7.5 MG TOTAL) BY MOUTH AT BEDTIME.   [DISCONTINUED] Multiple Vitamin (MULTIVITAMIN WITH MINERALS) TABS tablet Take 1 tablet by mouth daily.   [DISCONTINUED] rosuvastatin (CRESTOR) 5 MG tablet TAKE 1 TABLET BY MOUTH AT BEDTIME   [DISCONTINUED] sertraline  (ZOLOFT) 50 MG tablet    No facility-administered medications prior to visit.    Review of Systems See HPI     Last CBC Lab Results  Component Value Date   WBC 6.9 08/03/2021   HGB 13.4 08/03/2021   HCT 39.9 08/03/2021   MCV 92 08/03/2021   MCH 30.7 08/03/2021   RDW 12.7 08/03/2021   PLT 202 [id number]   Last metabolic panel Lab Results  Component Value Date   GLUCOSE 113 (H) 08/03/2021   NA 141 08/03/2021   K 4.4 08/03/2021   CL 106 08/03/2021   CO2 22 08/03/2021   BUN 22 08/03/2021   CREATININE 0.70 09/12/2021   EGFR 78 08/03/2021   CALCIUM 10.1 08/03/2021   PROT 6.6 08/03/2021   ALBUMIN 4.5 08/03/2021   LABGLOB 2.1 08/03/2021   AGRATIO 2.1 08/03/2021   BILITOT 0.5 08/03/2021   ALKPHOS 83 08/03/2021   AST 21 08/03/2021   ALT 8 08/03/2021   ANIONGAP 5 07/13/2021   Last lipids Lab Results  Component Value Date   CHOL 183 08/03/2021   HDL 68 08/03/2021   LDLCALC 91 08/03/2021   LDLDIRECT 95 10/01/2019   TRIG 139 08/03/2021   CHOLHDL 2.7 08/03/2021   Last hemoglobin A1c Lab Results  Component Value Date   HGBA1C 6.3 (H) 08/03/2021   Last thyroid functions Lab Results  Component Value Date   TSH 4.950 (H) 11/22/2021   T3TOTAL 82 11/22/2021   Last vitamin D  Lab Results  Component Value Date   VD25OH 58.0 12/09/2018       Objective     Today's Vitals   05/04/22 1346  BP: 113/70  Pulse: 87  SpO2: 97%  Weight: 170 lb 6.4 oz (77.3 kg)  Height: '5\' 8"'$  (1.727 m)   Body mass index is 25.91 kg/m.  BP Readings from Last 3 Encounters:  05/04/22 113/70  05/02/22 (Abnormal) 153/66  04/11/22 (Abnormal) 144/68    Wt Readings from Last 3 Encounters:  05/04/22 170 lb 6.4 oz (77.3 kg)  05/02/22 171 lb 8 oz (77.8 kg)  05/01/22 171 lb 4 oz (77.7 kg)    Physical Exam Vitals and nursing note reviewed.  Constitutional:      Appearance: Normal appearance. She is well-developed.  HENT:     Head: Normocephalic and atraumatic.     Nose: Nose  normal.     Mouth/Throat:     Mouth: Mucous membranes are moist.     Pharynx: Oropharynx is clear.  Eyes:     Extraocular Movements: Extraocular movements intact.     Conjunctiva/sclera: Conjunctivae normal.     Pupils: Pupils are equal, round, and reactive to light.  Neck:     Vascular: No carotid bruit.  Cardiovascular:     Rate and Rhythm: Normal rate and regular rhythm.     Pulses: Normal pulses.     Heart sounds: Normal heart sounds.  Pulmonary:     Effort: Pulmonary effort is normal.     Breath sounds: Normal breath sounds.  Abdominal:     Palpations: Abdomen is soft.  Musculoskeletal:        General: Normal range of motion.     Cervical back: Normal range of motion and neck supple.  Lymphadenopathy:     Cervical: No cervical adenopathy.  Skin:    General: Skin is warm and dry.     Capillary Refill: Capillary refill takes less than 2 seconds.  Neurological:     General: No focal deficit present.     Mental Status: She is alert and oriented to person, place, and time. Mental status is at baseline.  Psychiatric:        Attention and Perception: Attention and perception normal.        Mood and Affect: Mood is depressed.        Speech: Speech normal.        Behavior: Behavior normal. Behavior is cooperative.        Thought Content: Thought content normal.        Cognition and Memory: Cognition and memory normal.        Judgment: Judgment normal.     Assessment & Plan    1. Essential hypertension Stable. Continue bp medication as prescribed   2. Mild episode of recurrent major depressive disorder (HCC) Increase remeron to 15 mg daily. Reassess at next visit.  - mirtazapine (REMERON) 15 MG tablet; Take 1 tablet (15 mg total) by mouth at bedtime.  Dispense: 90 tablet; Refill: 1  3. Mixed hyperlipidemia Conitnue crestor as prescribed. Check fasting lipids prior to next visit and adjust medication as indicated  - rosuvastatin (CRESTOR) 5 MG tablet; Take 1 tablet (5 mg  total) by mouth at bedtime.  Dispense: 90 tablet; Refill: 3  4. Parkinson's disease with dyskinesia, unspecified whether manifestations fluctuate Add low dose aricept at 5 mg every evening. Encouraged patient and her daughter to discuss this further with neurologist at next visit in May 2024.  -  donepezil (ARICEPT) 5 MG tablet; Take 1 tablet (5 mg total) by mouth at bedtime.  Dispense: 90 tablet; Refill: 1  5. Malignant neoplasm of lower-inner quadrant of left breast in female, estrogen receptor positive ([place]) Conitnue regular visits with oncology as scheduled.    Problem List Items Addressed This Visit       Cardiovascular and Mediastinum   Essential hypertension - Primary (Chronic)   Relevant Medications   rosuvastatin (CRESTOR) 5 MG tablet     Nervous and Auditory   Parkinson's disease   Relevant Medications   donepezil (ARICEPT) 5 MG tablet     Other   Mixed hyperlipidemia   Relevant Medications   rosuvastatin (CRESTOR) 5 MG tablet   Mild episode of recurrent major depressive disorder (HCC)   Relevant Medications   mirtazapine (REMERON) 15 MG tablet   Malignant neoplasm of lower-inner quadrant of left breast in female, estrogen receptor positive ([place])     Return in about 6 months (around 11/04/2022) for medicare wellness, FBW a week prior to visit.         Ronnell Freshwater, NP  [facility] [phone] (phone) [phone] (fax)  [place]

## 2022-05-13 ENCOUNTER — Other Ambulatory Visit: Payer: Self-pay | Admitting: Nurse Practitioner

## 2022-05-13 DIAGNOSIS — R12 Heartburn: Secondary | ICD-10-CM

## 2022-05-13 DIAGNOSIS — K219 Gastro-esophageal reflux disease without esophagitis: Secondary | ICD-10-CM

## 2022-05-17 NOTE — Progress Notes (Signed)
Patient Care Team: Lorrene Reid, PA-C (Inactive) as PCP - General (Physician Assistant) [name], MD as Consulting Physician (Cardiology) Sydnee Levans, MD as Referring Physician (Dermatology) Rehabilitation, Aceitunas Tat, Eustace Quail, DO as Consulting Physician (Neurology) Mauro Kaufmann, RN as Oncology Nurse Navigator Carlynn Spry, Charlott Holler, RN as Oncology Nurse Navigator Coralie Keens, MD as Consulting Physician (General Surgery) Nicholas Lose, MD as Consulting Physician (Hematology and Oncology) Eppie Gibson, MD as Attending Physician (Radiation Oncology) Lorrene Reid, PA-C (Inactive) as Physician Assistant (Physician Assistant)  DIAGNOSIS: No diagnosis found.  SUMMARY OF ONCOLOGIC HISTORY: Oncology History  Malignant neoplasm of lower-inner quadrant of left breast in female, estrogen receptor positive ([place])  07/11/2021 Initial Diagnosis   Skin changes in the left breast, screening mammogram revealed thickening of the lower medial quadrant along with enlarged lymph node in the axilla (2 additional lymph nodes) by ultrasound breast tumor measured 3.9 cm, biopsy: Grade 2 IDC with lymphovascular invasion ER 100%, PR 100%, HER2 positive Ki-67 40%, lymph node biopsy positive   07/13/2021 Cancer Staging   Staging form: Breast, AJCC 8th Edition - Clinical: Stage IB (cT2, cN1, cM0, G2, ER+, PR+, HER2+) - Signed by Nicholas Lose, MD on 07/13/2021 Stage prefix: Initial diagnosis Histologic grading system: 3 grade system    Genetic Testing   [employer] CustomNext Panel was Negative. Report date is 07/24/2021.  The CustomNext panel offered by [employer] includes sequencing, rearrangement, and RNA analysis for the following 41 genes: APC, ATM, BAP1, BARD1, BMPR1A, BRCA1, BRCA2, BRIP1, CDH1, CDK4, CDKN2A, CHEK2, DICER1, MLH1, MSH2, MSH6, MUTYH, NBN, NF1, NTHL1, PALB2, PMS2, POT1, PTEN, RAD51C, RAD51D, RB1, RECQL, SMAD4, SMARCA4, STK11 and TP53 (sequencing and  deletion/duplication); AXIN2, EGFR, HOXB13, MITF, MSH3, POLD1 and POLE (sequencing only); EPCAM and GREM1 (deletion/duplication only).    09/14/2021 - 10/04/2021 Chemotherapy   Patient is on Treatment Plan : BREAST Trastuzumab q21d     10/25/2021 -  Chemotherapy   Patient is on Treatment Plan : BREAST MAINTENANCE Trastuzumab IV (6) or SQ (600) D1 q21d X 11 Cycles       CHIEF COMPLIANT: Follow-up herceptin    INTERVAL HISTORY: Jamie [name] is a  88 y.o. female is here because of recent diagnosis of left breast. She presents to the clinic today for a follow-up.    ALLERGIES:  is allergic to flagyl [metronidazole].  MEDICATIONS:  Current Outpatient Medications  Medication Sig Dispense Refill   Biotin 1 MG CAPS      carbidopa-levodopa (SINEMET IR) 25-100 MG tablet Take 2 tablets by mouth 3 (three) times daily. 540 tablet 2   cholecalciferol (VITAMIN D3) 25 MCG (1000 UNIT) tablet Take 2,000 Units by mouth daily.     cyanocobalamin (VITAMIN B12) 500 MCG tablet      donepezil (ARICEPT) 5 MG tablet Take 1 tablet (5 mg total) by mouth at bedtime. 90 tablet 1   letrozole (FEMARA) 2.5 MG tablet Take 1 tablet (2.5 mg total) by mouth daily. 90 tablet 3   mirtazapine (REMERON) 15 MG tablet Take 1 tablet (15 mg total) by mouth at bedtime. 90 tablet 1   Multiple Vitamins-Minerals (PRESERVISION AREDS 2 PO) Take 1 tablet by mouth in the morning and at bedtime.      NON FORMULARY Herblax as needed     NON FORMULARY Cholesterol Reduction     omeprazole (PRILOSEC) 20 MG capsule TAKE 1 CAPSULE BY MOUTH DAILY AT 5:30 PM 30 capsule 2   rosuvastatin (CRESTOR) 5 MG tablet Take 1 tablet (  5 mg total) by mouth at bedtime. 90 tablet 3   Vitamin E 100 units TABS Take 250 mg by mouth. Once a day     No current facility-administered medications for this visit.    PHYSICAL EXAMINATION: ECOG PERFORMANCE STATUS: {CHL ONC ECOG PS:[phone]}  There were no vitals filed for this visit. There were no vitals filed  for this visit.  BREAST:*** No palpable masses or nodules in either right or left breasts. No palpable axillary supraclavicular or infraclavicular adenopathy no breast tenderness or nipple discharge. (exam performed in the presence of a chaperone)  LABORATORY DATA:  I have reviewed the data as listed    Latest Ref Rng & Units 09/12/2021    2:21 PM 08/03/2021    8:55 AM 07/13/2021    8:31 AM  CMP  Glucose 70 - 99 mg/dL  113  126   BUN 8 - 27 mg/dL  22  21   Creatinine 0.44 - 1.00 mg/dL 0.70  0.75  0.86   Sodium 134 - 144 mmol/L  141  140   Potassium 3.5 - 5.2 mmol/L  4.4  4.0   Chloride 96 - 106 mmol/L  106  107   CO2 20 - 29 mmol/L  22  28   Calcium 8.7 - 10.3 mg/dL  10.1  11.0   Total Protein 6.0 - 8.5 g/dL  6.6  7.3   Total Bilirubin 0.0 - 1.2 mg/dL  0.5  0.6   Alkaline Phos 44 - 121 IU/L  83  81   AST 0 - 40 IU/L  21  18   ALT 0 - 32 IU/L  8  18     Lab Results  Component Value Date   WBC 6.9 08/03/2021   HGB 13.4 08/03/2021   HCT 39.9 08/03/2021   MCV 92 08/03/2021   PLT 202 08/03/2021   NEUTROABS 4.5 07/13/2021    ASSESSMENT & PLAN:  No problem-specific Assessment & Plan notes found for this encounter.    No orders of the defined types were placed in this encounter.  The patient has a good understanding of the overall plan. she agrees with it. she will call with any problems that may develop before the next visit here. Total time spent: 30 mins including face to face time and time spent for planning, charting and co-ordination of care   Suzzette Righter, [place] 05/17/22    I Gardiner Coins am acting as a Education administrator for [employer]  ***

## 2022-05-22 ENCOUNTER — Encounter: Payer: Self-pay | Admitting: *Deleted

## 2022-05-23 ENCOUNTER — Inpatient Hospital Stay: Payer: Medicare PPO | Attending: Hematology and Oncology

## 2022-05-23 ENCOUNTER — Other Ambulatory Visit: Payer: Self-pay

## 2022-05-23 ENCOUNTER — Inpatient Hospital Stay: Payer: Medicare PPO | Admitting: Hematology and Oncology

## 2022-05-23 VITALS — BP 142/67 | HR 66 | Resp 18

## 2022-05-23 VITALS — BP 134/58 | HR 79 | Temp 97.5°F | Resp 18 | Ht 68.0 in | Wt 174.7 lb

## 2022-05-23 DIAGNOSIS — Z79811 Long term (current) use of aromatase inhibitors: Secondary | ICD-10-CM | POA: Insufficient documentation

## 2022-05-23 DIAGNOSIS — C50312 Malignant neoplasm of lower-inner quadrant of left female breast: Secondary | ICD-10-CM | POA: Insufficient documentation

## 2022-05-23 DIAGNOSIS — Z79899 Other long term (current) drug therapy: Secondary | ICD-10-CM | POA: Diagnosis not present

## 2022-05-23 DIAGNOSIS — Z9012 Acquired absence of left breast and nipple: Secondary | ICD-10-CM | POA: Insufficient documentation

## 2022-05-23 DIAGNOSIS — Z170 Estrogen receptor positive status [ER+]: Secondary | ICD-10-CM | POA: Diagnosis not present

## 2022-05-23 DIAGNOSIS — Z5112 Encounter for antineoplastic immunotherapy: Secondary | ICD-10-CM | POA: Diagnosis not present

## 2022-05-23 MED ORDER — TRASTUZUMAB-HYALURONIDASE-OYSK 600-10000 MG-UNT/5ML ~~LOC~~ SOLN
600.0000 mg | Freq: Once | SUBCUTANEOUS | Status: AC
  Administered 2022-05-23: 600 mg via SUBCUTANEOUS
  Filled 2022-05-23: qty 5

## 2022-05-23 MED ORDER — DIPHENHYDRAMINE HCL 25 MG PO CAPS
25.0000 mg | ORAL_CAPSULE | Freq: Once | ORAL | Status: AC
  Administered 2022-05-23: 25 mg via ORAL
  Filled 2022-05-23: qty 1

## 2022-05-23 NOTE — Assessment & Plan Note (Signed)
07/09/2021:Skin changes in the left breast, screening mammogram revealed thickening of the lower medial quadrant along with enlarged lymph node in the axilla (2 additional lymph nodes) by ultrasound breast tumor measured 3.9 cm, biopsy: Grade 2 IDC with lymphovascular invasion ER 100%, PR 100%, HER2 positive Ki-67 40%, lymph node biopsy positive   08/18/21: Left Mastectomy: Grade 3 IDC with micropapillary features with DCIS 5.8 cm 7/7 LN Positives, involves dermal stroma and lymphatics with focal Extra nodal invasion   09/12/2021: Bone scan focal radiotracer uptake right lateral femoral condyle (knee x-ray recommended) 09/12/2021: CT CAP: Several small left axillary lymph nodes.  No evidence of distant metastatic disease.  2 cm right thyroid nodule ------------------------------------------------------------------------------------------------------------------------  Treatment Plan: 1. adjuvant Herceptin with aromatase inhibitors started 09/14/2021 2.  Adjuvant radiation completed 11/09/2021   Adverse effects: Denies any adverse effects to either Herceptin or letrozole. Right knee swelling: The knee was drained and she is feeling much better.  Therefore she will resume letrozole at this time.   Return to clinic every 3 weeks for Herceptin and every 6 weeks for follow-up with me. We decided to finish her Herceptin treatments as of Jul 04, 2022. 

## 2022-05-23 NOTE — Patient Instructions (Signed)
[facility]  Discharge Instructions: Thank you for choosing [facility] to provide your oncology and hematology care.   If you have a lab appointment with the Cancer Center, please go directly to the Cancer Center and check in at the registration area.   Wear comfortable clothing and clothing appropriate for easy access to any Portacath or PICC line.   We strive to give you quality time with your provider. You may need to reschedule your appointment if you arrive late (15 or more minutes).  Arriving late affects you and other patients whose appointments are after yours.  Also, if you miss three or more appointments without notifying the office, you may be dismissed from the clinic at the provider's discretion.      For prescription refill requests, have your pharmacy contact our office and allow 72 hours for refills to be completed.    Today you received the following chemotherapy and/or immunotherapy agents: Trastuzumab      To help prevent nausea and vomiting after your treatment, we encourage you to take your nausea medication as directed.  BELOW ARE SYMPTOMS THAT SHOULD BE REPORTED IMMEDIATELY: *FEVER GREATER THAN 100.4 F (38 C) OR HIGHER *CHILLS OR SWEATING *NAUSEA AND VOMITING THAT IS NOT CONTROLLED WITH YOUR NAUSEA MEDICATION *UNUSUAL SHORTNESS OF BREATH *UNUSUAL BRUISING OR BLEEDING *URINARY PROBLEMS (pain or burning when urinating, or frequent urination) *BOWEL PROBLEMS (unusual diarrhea, constipation, pain near the anus) TENDERNESS IN MOUTH AND THROAT WITH OR WITHOUT PRESENCE OF ULCERS (sore throat, sores in mouth, or a toothache) UNUSUAL RASH, SWELLING OR PAIN  UNUSUAL VAGINAL DISCHARGE OR ITCHING   Items with * indicate a potential emergency and should be followed up as soon as possible or go to the Emergency Department if any problems should occur.  Please show the CHEMOTHERAPY ALERT CARD or IMMUNOTHERAPY ALERT CARD at  check-in to the Emergency Department and triage nurse.  Should you have questions after your visit or need to cancel or reschedule your appointment, please contact [facility]  Dept: [phone]  and follow the prompts.  Office hours are 8:00 a.m. to 4:30 p.m. Monday - Friday. Please note that voicemails left after 4:00 p.m. may not be returned until the following business day.  We are closed weekends and major holidays. You have access to a nurse at all times for urgent questions. Please call the main number to the clinic Dept: [phone] and follow the prompts.   For any non-urgent questions, you may also contact your provider using MyChart. We now offer e-Visits for anyone 18 and older to request care online for non-urgent symptoms. For details visit [email or web address].   Also download the MyChart app! Go to the app store, search "MyChart", open the app, select [facility], and log in with your MyChart username and password.  

## 2022-05-24 NOTE — Progress Notes (Signed)
[name] [address] [place] [address] Phone: [phone] Subjective:   Jamie [name], am serving as a scribe for Dr. Antoine Primas.  I'm seeing this patient by the request  of:  [name], Maritza, PA-C (Inactive)  CC: Bilateral knee pain  IHK:[id number]  03/28/2022 Chronic, with worsening symptoms. Due to comorbidities including Parkinson's disease she is not a candidate for knee replacement. Bilateral injections given again today. Tolerated the procedure well, discussed icing regimen and home exercises, discussed which activities to do. Patient was with family member. Discussed that while worsening symptoms to seek medical attention. Follow-up with me again in 8 to 10 weeks   Updated 05/30/2022 Jamie [name] is a 88 y.o. female coming in with complaint of B knee pain. Patient states that her pain is the same after last visit. Injections do give her some relief.     Past Medical History:  Diagnosis Date   Abnormality of gait 04/18/2017   Degenerative joint disease of knee, right 03/19/2018   Injected April 18, 2018 Approved for Durolane R knee 3.4.20. Patient given another steroid injection Jun 24, 2018.  Repeat injection September 09, 2018 repeat injection February 27, 2019 repeat 6/110/20221 bilateral injections given December 01, 2019 Right knee injected April 20, 2020 monovisc May 19, 2018 Repeat steroid injection given June 16, 2020 repeat injection August 24, 2020   Essential hypertension 08/14/2016   Generalized anxiety disorder 06/27/2017   History of dizziness 06/27/2017   History of hysterectomy for benign disease 06/27/2017   History of vitamin D deficiency 08/22/2017   Leukocytes in urine 08/14/2016   Lumbar facet arthropathy 06/27/2017   Major neurocognitive disorder due to multiple etiologies 09/29/2020   Mixed hyperlipidemia 08/22/2017   Osteoarthritis of knee 09/18/2017   Pancolitis 08/14/2016   Parkinson's disease 03/13/2017    Prediabetes 08/22/2017   Right knee pain    Spinal stenosis of lumbar region    Weakness of both hips 04/18/2017   White coat syndrome without diagnosis of hypertension 08/22/2017   Past Surgical History:  Procedure Laterality Date   HYSTERECTOMY ABDOMINAL WITH SALPINGECTOMY     MASTECTOMY W/ SENTINEL NODE BIOPSY Left 08/18/2021   Procedure: LEFT MASTECTOMY WITH TARGETED LYMPH NODE DISSECTION;  Surgeon: Abigail Miyamoto, MD;  Location: [facility];  Service: General;  Laterality: Left;   TONSILLECTOMY     Social History   Socioeconomic History   Marital status: Widowed    Spouse name: Not on file   Number of children: 3   Years of education: 12   Highest education level: High school graduate  Occupational History   Not on file  Tobacco Use   Smoking status: Never   Smokeless tobacco: Never  Vaping Use   Vaping Use: Never used  Substance and Sexual Activity   Alcohol use: No    Alcohol/week: 0.0 standard drinks of alcohol   Drug use: No   Sexual activity: Not Currently    Birth control/protection: Surgical  Other Topics Concern   Not on file  Social History Narrative   Pt lives alone in her 1 story home- she has 3 sons, and a high school graduate. She is right handed, she drinks coffee, and one soda a week mostly water   Social Determinants of Health   Financial Resource Strain: Low Risk  (07/13/2021)   Overall Financial Resource Strain (CARDIA)    Difficulty of Paying Living Expenses: Not hard at all  Food Insecurity: No Food  Insecurity (07/13/2021)   Hunger Vital Sign    Worried About Running Out of Food in the Last Year: Never true    Ran Out of Food in the Last Year: Never true  Transportation Needs: No Transportation Needs (07/13/2021)   PRAPARE - Administrator, Civil Service (Medical): No    Lack of Transportation (Non-Medical): No  Physical Activity: Not on file  Stress: No Stress Concern Present (08/16/2021)   [employer] of  Occupational Health - Occupational Stress Questionnaire    Feeling of Stress : Not at all  Social Connections: Moderately Integrated (08/16/2021)   Social Connection and Isolation Panel [NHANES]    Frequency of Communication with Friends and Family: More than three times a week    Frequency of Social Gatherings with Friends and Family: More than three times a week    Attends Religious Services: More than 4 times per year    Active Member of [employer] or Organizations: Yes    Attends Banker Meetings: More than 4 times per year    Marital Status: Widowed   Allergies  Allergen Reactions   Flagyl [Metronidazole] Rash    Possible cause of rash    Family History  Problem Relation Age of Onset   Breast cancer Mother 107   Melanoma Father        dx. 46s   Lung cancer Father        dx. 62s   Healthy Son      Current Outpatient Medications (Cardiovascular):    rosuvastatin (CRESTOR) 5 MG tablet, Take 1 tablet (5 mg total) by mouth at bedtime.    Current Outpatient Medications (Hematological):    cyanocobalamin (VITAMIN B12) 500 MCG tablet,   Current Outpatient Medications (Other):    Biotin 1 MG CAPS,    carbidopa-levodopa (SINEMET IR) 25-100 MG tablet, Take 2 tablets by mouth 3 (three) times daily.   cholecalciferol (VITAMIN D3) 25 MCG (1000 UNIT) tablet, Take 2,000 Units by mouth daily.   donepezil (ARICEPT) 5 MG tablet, Take 1 tablet (5 mg total) by mouth at bedtime.   letrozole (FEMARA) 2.5 MG tablet, Take 1 tablet (2.5 mg total) by mouth daily.   mirtazapine (REMERON) 15 MG tablet, Take 1 tablet (15 mg total) by mouth at bedtime.   Multiple Vitamins-Minerals (PRESERVISION AREDS 2 PO), Take 1 tablet by mouth in the morning and at bedtime.    NON FORMULARY, Herblax as needed   NON FORMULARY, Cholesterol Reduction   omeprazole (PRILOSEC) 20 MG capsule, TAKE 1 CAPSULE BY MOUTH DAILY AT 5:30 PM   Vitamin E 100 units TABS, Take 250 mg by mouth. Once a day   Reviewed  prior external information including notes and imaging from  primary care provider As well as notes that were available from care everywhere and other healthcare systems.  Past medical history, social, surgical and family history all reviewed in electronic medical record.  No pertanent information unless stated regarding to the chief complaint.   Review of Systems:  No headache, visual changes, nausea, vomiting, diarrhea, constipation, dizziness, abdominal pain, skin rash, fevers, chills, night sweats, weight loss, swollen lymph nodes, body aches, joint swelling, chest pain, shortness of breath, mood changes. POSITIVE muscle aches  Objective  Blood pressure (!) 142/84, pulse 76, height 5\' 8"  (1.727 m), weight 178 lb (80.7 kg), SpO2 96 %.   General: No apparent distress alert and oriented x3 mood and affect normal, dressed appropriately.  HEENT: Pupils equal, extraocular movements intact  Respiratory: Patient's speak in full sentences and does not appear short of breath  Sharpoint gait noted, antalgic gait. Patient does have tenderness to palpation in the knees bilaterally.  Instability of the knees with crepitus with flexion and extension.  After informed written and verbal consent, patient was seated on exam table. Right knee was prepped with alcohol swab and utilizing anterolateral approach, patient's right knee space was injected with 4:1  marcaine 0.5%: Kenalog 40mg /dL. Patient tolerated the procedure well without immediate complications.  After informed written and verbal consent, patient was seated on exam table. Left knee was prepped with alcohol swab and utilizing anterolateral approach, patient's left knee space was injected with 4:1  marcaine 0.5%: Kenalog 40mg /dL. Patient tolerated the procedure well without immediate complications.    Impression and Recommendations:     The above documentation has been reviewed and is accurate and complete Judi Saa, DO

## 2022-05-28 ENCOUNTER — Encounter: Payer: Self-pay | Admitting: Neurology

## 2022-05-30 ENCOUNTER — Encounter: Payer: Self-pay | Admitting: Family Medicine

## 2022-05-30 ENCOUNTER — Ambulatory Visit: Payer: Medicare PPO | Admitting: Family Medicine

## 2022-05-30 VITALS — BP 142/84 | HR 76 | Ht 68.0 in | Wt 178.0 lb

## 2022-05-30 DIAGNOSIS — M170 Bilateral primary osteoarthritis of knee: Secondary | ICD-10-CM | POA: Diagnosis not present

## 2022-05-30 NOTE — Patient Instructions (Addendum)
Good to see you Injected both knees today See me again 10 weeks

## 2022-05-30 NOTE — Assessment & Plan Note (Signed)
Degenerative disc disease significant arthritic changes.  Patient does have the abnormality of the gait and the spinal stenosis that can cause some weakness as well.  Discussed with patient about icing regimen and home exercises.  Patient is with family member and we discussed which activities to do.  Can continue to repeat injections every 10 weeks if needed.  Secondary to patient's age likely not a surgical candidate.  Follow-up again in 10 weeks

## 2022-06-07 NOTE — Progress Notes (Signed)
Virtual Visit Via Video       Consent was obtained for video visit:  Yes.   Answered questions that patient had about telehealth interaction:  Yes.   I discussed the limitations, risks, security and privacy concerns of performing an evaluation and management service by telemedicine. I also discussed with the patient that there may be a patient responsible charge related to this service. The patient expressed understanding and agreed to proceed.  Pt location: Home Physician Location: office Name of referring provider:  No ref. provider found I connected with Jamie [name] at patients initiation/request on 06/09/2022 at 10:15 AM EDT by video enabled telemedicine application and verified that I am speaking with the correct person using two identifiers. Pt MRN:  [id number] Pt DOB:  April 25, 1934 Video Participants:  Jamie [name];  daughter   Assessment/Plan:   1.  Parkinsons Disease/SWEDD  -continue carbidopa/levodopa 25/100, 2 tablets 3 times daily.    -Add carbidopa/levodopa 50/200 CR at bedtime for nighttime tremor  2.  Insomnia  -Has received benefit with melatonin, 2.5 mg nightly and will continue that  3.  Depression  -Continue Lexapro, 20 mg daily.  She is doing well with this.    -now on mirtazapine by PA, 15 mg q hs which has helped mood and appetite  4.  Dementia, mixed type, PDD and AD  -This was identified on neurocognitive testing in August, 2022.  5.  Breast cancer  -Status post left mastectomy, on herceptin and letrozole.  Will finish herceptin 07/04/22 6.  Urinary incontinence at night  -saw Dr. Ashley Royalty one time and doing well. 7.  She has f/u in 1 month and would like to keep that  Subjective:   Jamie [name] was seen today in follow up for Parkinsons disease.  My previous records were reviewed prior to todays visit as well as outside records available to me.  Patient with daughter in law who supplements the history.  They email me to let me know patient was  having tremors when she was lying in bed to go to sleep.  Once she gets up and takes her medications in the morning, the tremors ceased.  Pt states that the upper body and face seem to jerk "all night long."  PA increased the 15 mg remeron at bed and it helped some with appetite and sleep.  Current prescribed movement disorder medications: Carbidopa/levodopa 25/100, 2 tablets 3 times per day  Melatonin, 3 mg daily Lexapro 20 mg Mirtazapine started by PA, 15 mg q hs     ALLERGIES:   Allergies  Allergen Reactions   Flagyl [Metronidazole] Rash    Possible cause of rash     CURRENT MEDICATIONS:  Outpatient Encounter Medications as of 06/09/2022  Medication Sig   Biotin 1 MG CAPS    carbidopa-levodopa (SINEMET IR) 25-100 MG tablet Take 2 tablets by mouth 3 (three) times daily.   cholecalciferol (VITAMIN D3) 25 MCG (1000 UNIT) tablet Take 2,000 Units by mouth daily.   cyanocobalamin (VITAMIN B12) 500 MCG tablet    donepezil (ARICEPT) 5 MG tablet Take 1 tablet (5 mg total) by mouth at bedtime.   letrozole (FEMARA) 2.5 MG tablet Take 1 tablet (2.5 mg total) by mouth daily.   mirtazapine (REMERON) 15 MG tablet Take 1 tablet (15 mg total) by mouth at bedtime.   Multiple Vitamins-Minerals (PRESERVISION AREDS 2 PO) Take 1 tablet by mouth in the morning and at bedtime.    NON FORMULARY Herblax as needed  NON FORMULARY Cholesterol Reduction   omeprazole (PRILOSEC) 20 MG capsule TAKE 1 CAPSULE BY MOUTH DAILY AT 5:30 PM   rosuvastatin (CRESTOR) 5 MG tablet Take 1 tablet (5 mg total) by mouth at bedtime.   Vitamin E 100 units TABS Take 250 mg by mouth. Once a day   No facility-administered encounter medications on file as of 06/09/2022.    Objective:   PHYSICAL EXAMINATION:    VITALS:   There were no vitals filed for this visit.   Wt Readings from Last 3 Encounters:  05/30/22 178 lb (80.7 kg)  05/23/22 174 lb 11.2 oz (79.2 kg)  05/04/22 170 lb 6.4 oz (77.3 kg)    GEN:  The patient  appears stated age and is in NAD. HEENT:  Normocephalic, atraumatic.    Neurological examination:  Orientation: The patient is alert and oriented x3. Cranial nerves: There is good facial symmetry with significant facial hypomimia. The speech is fluent and clear.  Hearing is intact to conversational tone. Motor: Strength is at least antigravity x4.  Movement examination: Tone: Unable Abnormal movements: None Coordination:  There is good hand opening and closing and finger taps today. Gait and Station: The patient pushes off to arise.  She ambulates slow and purposeful with decreased arm swing bilaterally  I have reviewed and interpreted the following labs independently    Chemistry      Component Value Date/Time   NA 141 08/03/2021 0855   K 4.4 08/03/2021 0855   CL 106 08/03/2021 0855   CO2 22 08/03/2021 0855   BUN 22 08/03/2021 0855   CREATININE 0.70 09/12/2021 1421   CREATININE 0.86 07/13/2021 0831   GLU 98 05/30/2017 0000      Component Value Date/Time   CALCIUM 10.1 08/03/2021 0855   ALKPHOS 83 08/03/2021 0855   AST 21 08/03/2021 0855   AST 18 07/13/2021 0831   ALT 8 08/03/2021 0855   ALT 18 07/13/2021 0831   BILITOT 0.5 08/03/2021 0855   BILITOT 0.6 07/13/2021 0831       Lab Results  Component Value Date   WBC 6.9 08/03/2021   HGB 13.4 08/03/2021   HCT 39.9 08/03/2021   MCV 92 08/03/2021   PLT 202 08/03/2021    Lab Results  Component Value Date   TSH 4.950 (H) 11/22/2021   Follow up Instructions      -I discussed the assessment and treatment plan with the patient. The patient was provided an opportunity to ask questions and all were answered. The patient agreed with the plan and demonstrated an understanding of the instructions.   The patient was advised to call back or seek an in-person evaluation if the symptoms worsen or if the condition fails to improve as anticipated.     Kerin Salen, DO   Cc:  Mayer Masker, PA-C (Inactive)

## 2022-06-09 ENCOUNTER — Telehealth: Payer: Medicare PPO | Admitting: Neurology

## 2022-06-09 DIAGNOSIS — G4700 Insomnia, unspecified: Secondary | ICD-10-CM

## 2022-06-09 DIAGNOSIS — G20A1 Parkinson's disease without dyskinesia, without mention of fluctuations: Secondary | ICD-10-CM

## 2022-06-09 DIAGNOSIS — F32A Depression, unspecified: Secondary | ICD-10-CM | POA: Diagnosis not present

## 2022-06-09 MED ORDER — CARBIDOPA-LEVODOPA ER 50-200 MG PO TBCR
1.0000 | EXTENDED_RELEASE_TABLET | Freq: Every day | ORAL | 1 refills | Status: DC
Start: 2022-06-09 — End: 2022-12-01

## 2022-06-12 ENCOUNTER — Ambulatory Visit: Payer: Medicare PPO | Attending: Surgery

## 2022-06-12 ENCOUNTER — Other Ambulatory Visit: Payer: Self-pay | Admitting: *Deleted

## 2022-06-12 VITALS — Wt 176.5 lb

## 2022-06-12 DIAGNOSIS — I70203 Unspecified atherosclerosis of native arteries of extremities, bilateral legs: Secondary | ICD-10-CM | POA: Diagnosis not present

## 2022-06-12 DIAGNOSIS — B351 Tinea unguium: Secondary | ICD-10-CM | POA: Diagnosis not present

## 2022-06-12 DIAGNOSIS — Z483 Aftercare following surgery for neoplasm: Secondary | ICD-10-CM

## 2022-06-12 DIAGNOSIS — Z170 Estrogen receptor positive status [ER+]: Secondary | ICD-10-CM

## 2022-06-12 DIAGNOSIS — L84 Corns and callosities: Secondary | ICD-10-CM | POA: Diagnosis not present

## 2022-06-12 NOTE — Therapy (Addendum)
OUTPATIENT PHYSICAL THERAPY SOZO SCREENING NOTE   Patient Name: Jamie [name] MRN: [id number] DOB:01/17/1935, 88 y.o., female Today's Date: 06/12/2022  PCP: Mayer Masker, PA-C (Inactive) REFERRING PROVIDER: Abigail Miyamoto, MD   PT End of Session - 06/12/22 1004     Visit Number 3   # unchanged due to screen only   PT Start Time 1000    PT Stop Time 1007    PT Time Calculation (min) 7 min    Activity Tolerance Patient tolerated treatment well    Behavior During Therapy [facility] for tasks assessed/performed             Past Medical History:  Diagnosis Date   Abnormality of gait 04/18/2017   Degenerative joint disease of knee, right 03/19/2018   Injected April 18, 2018 Approved for Durolane R knee 3.4.20. Patient given another steroid injection Jun 24, 2018.  Repeat injection September 09, 2018 repeat injection February 27, 2019 repeat 6/110/20221 bilateral injections given December 01, 2019 Right knee injected April 20, 2020 monovisc May 19, 2018 Repeat steroid injection given June 16, 2020 repeat injection August 24, 2020   Essential hypertension 08/14/2016   Generalized anxiety disorder 06/27/2017   History of dizziness 06/27/2017   History of hysterectomy for benign disease 06/27/2017   History of vitamin D deficiency 08/22/2017   Leukocytes in urine 08/14/2016   Lumbar facet arthropathy 06/27/2017   Major neurocognitive disorder due to multiple etiologies 09/29/2020   Mixed hyperlipidemia 08/22/2017   Osteoarthritis of knee 09/18/2017   Pancolitis 08/14/2016   Parkinson's disease 03/13/2017   Prediabetes 08/22/2017   Right knee pain    Spinal stenosis of lumbar region    Weakness of both hips 04/18/2017   White coat syndrome without diagnosis of hypertension 08/22/2017   Past Surgical History:  Procedure Laterality Date   HYSTERECTOMY ABDOMINAL WITH SALPINGECTOMY     MASTECTOMY W/ SENTINEL NODE BIOPSY Left 08/18/2021   Procedure: LEFT MASTECTOMY WITH TARGETED LYMPH  NODE DISSECTION;  Surgeon: Abigail Miyamoto, MD;  Location: [facility];  Service: General;  Laterality: Left;   TONSILLECTOMY     Patient Active Problem List   Diagnosis Date Noted   Gastroesophageal reflux disease without esophagitis 03/19/2022   Heartburn 03/19/2022   S/P left mastectomy 08/18/2021   Genetic testing 07/25/2021   Family history of breast cancer 07/14/2021   Malignant neoplasm of lower-inner quadrant of left breast in female, estrogen receptor positive 07/11/2021   Mild episode of recurrent major depressive disorder 04/06/2021   Age-related osteoporosis without current pathological fracture 04/06/2021   Greater trochanteric bursitis of right hip 01/21/2021   Major neurocognitive disorder due to multiple etiologies 09/29/2020   Right knee pain 05/07/2018   Osteoarthritis of knees, bilateral 03/19/2018   Osteoarthritis of knee 09/18/2017   Mixed hyperlipidemia 08/22/2017   Prediabetes 08/22/2017   White coat syndrome without diagnosis of hypertension 08/22/2017   History of vitamin D deficiency 08/22/2017   Lumbar facet arthropathy 06/27/2017   History of hysterectomy for benign disease 06/27/2017   Generalized anxiety disorder 06/27/2017   History of dizziness 06/27/2017   Other fatigue 06/27/2017   Overweight (BMI 25.0-29.9) 06/27/2017   Weakness of both hips 04/18/2017   Abnormality of gait 04/18/2017   Spinal stenosis of lumbar region 04/18/2017   Parkinson's disease 03/13/2017   Pancolitis 08/14/2016   Essential hypertension 08/14/2016   Leukocytes in urine 08/14/2016    REFERRING DIAG: left breast cancer at risk for lymphedema  THERAPY DIAG: Aftercare following  surgery for neoplasm  PERTINENT HISTORY: Patient was diagnosed on 06/27/2021 with left grade 2 invasive ductal carcinoma breast cancer. She underwent a left mastectomy and targeted axillary lymph node dissection (7/7 nodes positive) on 08/18/2021. It is triple positive with a Ki67 of  40%. Patient has cognitive deficits effecting memory and was diagnosed with Parkinson's in 2020.   PRECAUTIONS: left UE Lymphedema risk, None  SUBJECTIVE: Pt returns for her 1 month L-Dex screen.   PAIN:  Are you having pain? No  SOZO SCREENING:  Patient was assessed today using the SOZO machine to determine the lymphedema index score. This was compared to her baseline score. It was determined that she is NOT within the recommended range when compared to her baseline. It is recommended that she begin physical therapy treatment at this time to learn MLD due to her change from baseline not reducing in 1 month of wearing her compression sleeve. Patient reported a change in status to PTA which initiated the PTA consulting with a PT. PT determined it would be appropriate to initiate therapy at this time. PT requested a referral from patient's provider.    L-DEX FLOWSHEETS - 06/12/22 1000       L-DEX LYMPHEDEMA SCREENING   Measurement Type Unilateral    L-DEX MEASUREMENT EXTREMITY Upper Extremity    POSITION  Standing    DOMINANT SIDE Right    At Risk Side Left    BASELINE SCORE (UNILATERAL) 0.1    L-DEX SCORE (UNILATERAL) 12    VALUE CHANGE (UNILAT) 11.9             P: Eval for Lt UE lymphedema and instruct family member in MLD for same.     Hermenia Bers, PTA 06/12/2022, 10:08 AM     Cancer Rehab [phone]

## 2022-06-13 ENCOUNTER — Other Ambulatory Visit: Payer: Self-pay

## 2022-06-13 ENCOUNTER — Ambulatory Visit: Payer: Medicare PPO

## 2022-06-13 ENCOUNTER — Inpatient Hospital Stay: Payer: Medicare PPO

## 2022-06-13 VITALS — BP 132/61 | HR 73 | Temp 97.6°F | Resp 16 | Wt 177.0 lb

## 2022-06-13 DIAGNOSIS — Z5112 Encounter for antineoplastic immunotherapy: Secondary | ICD-10-CM | POA: Diagnosis not present

## 2022-06-13 DIAGNOSIS — Z170 Estrogen receptor positive status [ER+]: Secondary | ICD-10-CM | POA: Diagnosis not present

## 2022-06-13 DIAGNOSIS — Z79899 Other long term (current) drug therapy: Secondary | ICD-10-CM | POA: Diagnosis not present

## 2022-06-13 DIAGNOSIS — C50312 Malignant neoplasm of lower-inner quadrant of left female breast: Secondary | ICD-10-CM | POA: Diagnosis not present

## 2022-06-13 DIAGNOSIS — Z79811 Long term (current) use of aromatase inhibitors: Secondary | ICD-10-CM | POA: Diagnosis not present

## 2022-06-13 DIAGNOSIS — Z9012 Acquired absence of left breast and nipple: Secondary | ICD-10-CM | POA: Diagnosis not present

## 2022-06-13 MED ORDER — TRASTUZUMAB-HYALURONIDASE-OYSK 600-10000 MG-UNT/5ML ~~LOC~~ SOLN
600.0000 mg | Freq: Once | SUBCUTANEOUS | Status: AC
  Administered 2022-06-13: 600 mg via SUBCUTANEOUS
  Filled 2022-06-13: qty 5

## 2022-06-13 NOTE — Progress Notes (Signed)
Patient took Benadryl prior to appointment time

## 2022-06-13 NOTE — Patient Instructions (Signed)
[facility]  Discharge Instructions: Thank you for choosing [facility] to provide your oncology and hematology care.   If you have a lab appointment with the Cancer Center, please go directly to the Cancer Center and check in at the registration area.   Wear comfortable clothing and clothing appropriate for easy access to any Portacath or PICC line.   We strive to give you quality time with your provider. You may need to reschedule your appointment if you arrive late (15 or more minutes).  Arriving late affects you and other patients whose appointments are after yours.  Also, if you miss three or more appointments without notifying the office, you may be dismissed from the clinic at the provider's discretion.      For prescription refill requests, have your pharmacy contact our office and allow 72 hours for refills to be completed.    Today you received the following chemotherapy and/or immunotherapy agents: trastuzumab-hyaluronidase      To help prevent nausea and vomiting after your treatment, we encourage you to take your nausea medication as directed.  BELOW ARE SYMPTOMS THAT SHOULD BE REPORTED IMMEDIATELY: *FEVER GREATER THAN 100.4 F (38 C) OR HIGHER *CHILLS OR SWEATING *NAUSEA AND VOMITING THAT IS NOT CONTROLLED WITH YOUR NAUSEA MEDICATION *UNUSUAL SHORTNESS OF BREATH *UNUSUAL BRUISING OR BLEEDING *URINARY PROBLEMS (pain or burning when urinating, or frequent urination) *BOWEL PROBLEMS (unusual diarrhea, constipation, pain near the anus) TENDERNESS IN MOUTH AND THROAT WITH OR WITHOUT PRESENCE OF ULCERS (sore throat, sores in mouth, or a toothache) UNUSUAL RASH, SWELLING OR PAIN  UNUSUAL VAGINAL DISCHARGE OR ITCHING   Items with * indicate a potential emergency and should be followed up as soon as possible or go to the Emergency Department if any problems should occur.  Please show the CHEMOTHERAPY ALERT CARD or IMMUNOTHERAPY  ALERT CARD at check-in to the Emergency Department and triage nurse.  Should you have questions after your visit or need to cancel or reschedule your appointment, please contact [facility]  Dept: [phone]  and follow the prompts.  Office hours are 8:00 a.m. to 4:30 p.m. Monday - Friday. Please note that voicemails left after 4:00 p.m. may not be returned until the following business day.  We are closed weekends and major holidays. You have access to a nurse at all times for urgent questions. Please call the main number to the clinic Dept: [phone] and follow the prompts.   For any non-urgent questions, you may also contact your provider using MyChart. We now offer e-Visits for anyone 18 and older to request care online for non-urgent symptoms. For details visit [email or web address].   Also download the MyChart app! Go to the app store, search "MyChart", open the app, select , and log in with your MyChart username and password.  

## 2022-06-22 NOTE — Therapy (Signed)
OUTPATIENT PHYSICAL THERAPY  UPPER EXTREMITY ONCOLOGY EVALUATION  Patient Name: Jamie [name] MRN: [id number] DOB:1934/03/11, 88 y.o., female Today's Date: 06/23/2022  END OF SESSION:  PT End of Session - 06/23/22 1012     Visit Number 4    Number of Visits 12    Date for PT Re-Evaluation 07/21/22    PT Start Time 1013    PT Stop Time 1055    PT Time Calculation (min) 42 min    Activity Tolerance Patient tolerated treatment well    Behavior During Therapy [facility] for tasks assessed/performed             Past Medical History:  Diagnosis Date   Abnormality of gait 04/18/2017   Degenerative joint disease of knee, right 03/19/2018   Injected April 18, 2018 Approved for Durolane R knee 3.4.20. Patient given another steroid injection Jun 24, 2018.  Repeat injection September 09, 2018 repeat injection February 27, 2019 repeat 6/110/20221 bilateral injections given December 01, 2019 Right knee injected April 20, 2020 monovisc May 19, 2018 Repeat steroid injection given June 16, 2020 repeat injection August 24, 2020   Essential hypertension 08/14/2016   Generalized anxiety disorder 06/27/2017   History of dizziness 06/27/2017   History of hysterectomy for benign disease 06/27/2017   History of vitamin D deficiency 08/22/2017   Leukocytes in urine 08/14/2016   Lumbar facet arthropathy 06/27/2017   Major neurocognitive disorder due to multiple etiologies 09/29/2020   Mixed hyperlipidemia 08/22/2017   Osteoarthritis of knee 09/18/2017   Pancolitis 08/14/2016   Parkinson's disease 03/13/2017   Prediabetes 08/22/2017   Right knee pain    Spinal stenosis of lumbar region    Weakness of both hips 04/18/2017   White coat syndrome without diagnosis of hypertension 08/22/2017   Past Surgical History:  Procedure Laterality Date   HYSTERECTOMY ABDOMINAL WITH SALPINGECTOMY     MASTECTOMY W/ SENTINEL NODE BIOPSY Left 08/18/2021   Procedure: LEFT MASTECTOMY WITH TARGETED LYMPH NODE DISSECTION;   Surgeon: Abigail Miyamoto, MD;  Location: [facility];  Service: General;  Laterality: Left;   TONSILLECTOMY     Patient Active Problem List   Diagnosis Date Noted   Gastroesophageal reflux disease without esophagitis 03/19/2022   Heartburn 03/19/2022   S/P left mastectomy 08/18/2021   Genetic testing 07/25/2021   Family history of breast cancer 07/14/2021   Malignant neoplasm of lower-inner quadrant of left breast in female, estrogen receptor positive (HCC) 07/11/2021   Mild episode of recurrent major depressive disorder (HCC) 04/06/2021   Age-related osteoporosis without current pathological fracture 04/06/2021   Greater trochanteric bursitis of right hip 01/21/2021   Major neurocognitive disorder due to multiple etiologies 09/29/2020   Right knee pain 05/07/2018   Osteoarthritis of knees, bilateral 03/19/2018   Osteoarthritis of knee 09/18/2017   Mixed hyperlipidemia 08/22/2017   Prediabetes 08/22/2017   White coat syndrome without diagnosis of hypertension 08/22/2017   History of vitamin D deficiency 08/22/2017   Lumbar facet arthropathy 06/27/2017   History of hysterectomy for benign disease 06/27/2017   Generalized anxiety disorder 06/27/2017   History of dizziness 06/27/2017   Other fatigue 06/27/2017   Overweight (BMI 25.0-29.9) 06/27/2017   Weakness of both hips 04/18/2017   Abnormality of gait 04/18/2017   Spinal stenosis of lumbar region 04/18/2017   Parkinson's disease 03/13/2017   Pancolitis 08/14/2016   Essential hypertension 08/14/2016   Leukocytes in urine 08/14/2016       REFERRING PROVIDER: Serena Croissant MD  REFERRING DIAG:  Left UE lymphedema  THERAPY DIAG:  Malignant neoplasm of lower-inner quadrant of left breast in female, estrogen receptor positive (HCC)  Lymphedema, not elsewhere classified  ONSET DATE: 06/12/2022  Rationale for Evaluation and Treatment: Rehabilitation  SUBJECTIVE:                                                                                                                                                                                            SUBJECTIVE STATEMENT:  Pt has been compliant with wearing her compression sleeve every day. Swelling was identified during [facility] screen on 06/12/2022 and she noticed mild swelling in the left arm a few weeks ago with her shirt sleeves feeling a little tight. Pt is very tender at the left chest and under her arm. She was attended by her grand daughter [name]. She will complete her Herceptin treatments on May 14.  PERTINENT HISTORY:  Patient was diagnosed on 06/27/2021 with left grade 2 invasive ductal carcinoma breast cancer. She underwent a left mastectomy and targeted axillary lymph node dissection (7/7 nodes positive) on 08/18/2021. It is triple positive with a Ki67 of 40%. She had radiation. Patient has cognitive deficits effecting memory and was diagnosed with Parkinson's in 2020   PAIN:    Are you having pain? No chest is just tender, noticed a week ago  PRECAUTIONS: Left UE lymphedema, Parkinsons, neuro cognitive disorder, OA , spinal stenosis, GAD  WEIGHT BEARING RESTRICTIONS: No  FALLS:  Has patient fallen in last 6 months? No  LIVING ENVIRONMENT: Lives with: lives alone, but has a family member with her every night, and they cook dinner for her Lives in: House/apartment Stairs: Yes; External: 4 steps; can reach both Has following equipment at home: Single point cane, Walker - 2 wheeled, and Grab bars  OCCUPATION: NA  LEISURE: read,  HAND DOMINANCE: right   PRIOR LEVEL OF FUNCTION: Independent with basic ADLs  PATIENT GOALS: Learn how to manage swelling   OBJECTIVE:  COGNITION: Overall cognitive status: Impaired   PALPATION: Tender left chest and left lateral trunk  OBSERVATIONS / OTHER ASSESSMENTS: mild redness left prox arm, swelling visible and palpable at left lateral trunk  SENSATION: Light touch: Appears intact    POSTURE:  forward head, rounded shoulders  UPPER EXTREMITY AROM/PROM:  A/PROM RIGHT   eval   Shoulder extension   Shoulder flexion 127  Shoulder abduction 124  Shoulder internal rotation   Shoulder external rotation     (Blank rows = not tested)  A/PROM LEFT   eval  Shoulder extension   Shoulder flexion 126 pulls at ribs  Shoulder abduction 115  Shoulder internal rotation  Shoulder external rotation     (Blank rows = not tested)  CERVICAL AROM: All within functional limits:     UPPER EXTREMITY STRENGTH:   LYMPHEDEMA ASSESSMENTS:   SURGERY TYPE/DATE: 08/18/21 Left Mastectomy with  NUMBER OF LYMPH NODES REMOVED: 7/7  CHEMOTHERAPY: NO  RADIATION:Yes, completed 11/09/2021  HORMONE TREATMENT: Letrozole  INFECTIONS: NO  Herceptin infusions will end May 14  LYMPHEDEMA ASSESSMENTS:   LANDMARK RIGHT  eval  At axilla  34.5  15 cm proximal to olecranon process 33.8  10 cm proximal to olecranon process 31.5  Olecranon process 26.5  15 cm proximal to ulnar styloid process 25.4  10 cm proximal to ulnar styloid process 22.8  Just proximal to ulnar styloid process 16  Across hand at thumb web space 18.1  At base of 2nd digit 6.0  (Blank rows = not tested)  LANDMARK LEFT  eval  At axilla  33.8  15 cm proximal to olecranon process 32.4  10 cm proximal to olecranon process 31.8  Olecranon process 25.8  15 cm proximal to ulnar styloid process 25  10 cm proximal to ulnar styloid process 22.4  Just proximal to ulnar styloid process 16.1  Across hand at thumb web space 18.1  At base of 2nd digit 6.0  (Blank rows = not tested)   L-DEX LYMPHEDEMA SCREENING: The patient was assessed using the L-Dex machine today to produce a lymphedema index baseline score. The patient will be reassessed on a regular basis (typically every 3 months) to obtain new L-Dex scores. If the score is > 6.5 points away from his/her baseline score indicating onset of subclinical lymphedema, it will be  recommended to wear a compression garment for 4 weeks, 12 hours per day and then be reassessed. If the score continues to be > 6.5 points from baseline at reassessment, we will initiate lymphedema treatment. Assessing in this manner has a 95% rate of preventing clinically significant lymphedema.   LLIS: 6  TODAY'S TREATMENT:                                                                                                                                          DATE:     06/23/2022 PATIENT EDUCATION:  Education details: POC, try compression bra for trunk edema, wash sleeve every 2 days at a minimum Person educated: Patient and granddaughter [name] Education method: Explanation Education comprehension: verbalized understanding  HOME EXERCISE PROGRAM: NA  ASSESSMENT:  CLINICAL IMPRESSION: Patient is an 88 y.o. female who was seen today for physical therapy evaluation and treatment for Left UE lymphedema. She had a screen placing her 6.9 points above baseline and was placed in a compression sleeve. .Her second screen on 06/12/2023 was 11.9 points above baseline which placed her in the lymphedema zone and it was determined she should be evaluated and a POC established. There is minimal difference in the arms presently with  right dominant UE measuring larger in all but one place.  There is also noted edema at the left lateral trunk region..AROM of the left shoulder is mildly limited with pain and tightness in the left chest and lateral trunk area. She will benefit from skilled PT to address deficits including shoulder AAROM exs, instruction to pts grand daughter in MLD to the left UE/trunk, and manual techniques as needed to address muscular pain. She and her grand daughter were advised to try her compression bra for lateral trunk swelling, and reminded to wash the compression sleeve at a minimum every 2 days to keep it snug.   OBJECTIVE IMPAIRMENTS: decreased activity tolerance, decreased cognition,  decreased ROM, increased edema, impaired UE functional use, postural dysfunction, and pain.   ACTIVITY LIMITATIONS: carrying, lifting, bathing, and reach over head  PARTICIPATION LIMITATIONS: meal prep, medication management, personal finances, driving, and community activity  PERSONAL FACTORS:Left Breast Cancer s/p radiation, Parkinsons, neuro cognitive disorder, OA , spinal stenosis, GAD are also affecting patient's functional outcome.   REHAB POTENTIAL: Good  CLINICAL DECISION MAKING: Stable/uncomplicated  EVALUATION COMPLEXITY: Low  GOALS: Goals reviewed with patient? Yes  SHORT TERM GOALS: Target date: 07/21/2022  Pt/family member will be educated in MLD for left UE lymphedema Baseline: Goal status: INITIAL  2.  Pt will have left shoulder ROM equal to left for flexion and abduction Baseline:  Goal status: INITIAL  3.  Pt will have decreased tenderness left chest and trunk area Baseline:  Goal status: INITIAL  4.  Pt will get benefit from compression bra for lateral trunk swelling Baseline:  Goal status: INITIAL     PLAN:  PT FREQUENCY: 2x/week  PT DURATION: 4 weeks  PLANNED INTERVENTIONS: Therapeutic exercises, Patient/Family education, Self Care, Orthotic/Fit training, Manual lymph drainage, Compression bandaging, scar mobilization, Manual therapy, and Re-evaluation  PLAN FOR NEXT SESSION: Did she try compression bra? AAROM supine, instruct family member in MLD to left UE and left lateral trunk, STM to left pectorals/UT prn  Waynette Buttery, PT 06/23/2022, 12:38 PM

## 2022-06-23 ENCOUNTER — Ambulatory Visit: Payer: Medicare PPO | Attending: Hematology and Oncology

## 2022-06-23 ENCOUNTER — Other Ambulatory Visit: Payer: Self-pay

## 2022-06-23 DIAGNOSIS — M25612 Stiffness of left shoulder, not elsewhere classified: Secondary | ICD-10-CM | POA: Diagnosis not present

## 2022-06-23 DIAGNOSIS — R293 Abnormal posture: Secondary | ICD-10-CM | POA: Diagnosis not present

## 2022-06-23 DIAGNOSIS — Z483 Aftercare following surgery for neoplasm: Secondary | ICD-10-CM | POA: Diagnosis not present

## 2022-06-23 DIAGNOSIS — C50312 Malignant neoplasm of lower-inner quadrant of left female breast: Secondary | ICD-10-CM | POA: Insufficient documentation

## 2022-06-23 DIAGNOSIS — I890 Lymphedema, not elsewhere classified: Secondary | ICD-10-CM | POA: Insufficient documentation

## 2022-06-23 DIAGNOSIS — Z170 Estrogen receptor positive status [ER+]: Secondary | ICD-10-CM | POA: Insufficient documentation

## 2022-06-26 ENCOUNTER — Ambulatory Visit: Payer: Medicare PPO | Admitting: Physical Therapy

## 2022-06-26 ENCOUNTER — Encounter: Payer: Self-pay | Admitting: Physical Therapy

## 2022-06-26 DIAGNOSIS — Z483 Aftercare following surgery for neoplasm: Secondary | ICD-10-CM

## 2022-06-26 DIAGNOSIS — R293 Abnormal posture: Secondary | ICD-10-CM | POA: Diagnosis not present

## 2022-06-26 DIAGNOSIS — I890 Lymphedema, not elsewhere classified: Secondary | ICD-10-CM | POA: Diagnosis not present

## 2022-06-26 DIAGNOSIS — Z170 Estrogen receptor positive status [ER+]: Secondary | ICD-10-CM | POA: Diagnosis not present

## 2022-06-26 DIAGNOSIS — C50312 Malignant neoplasm of lower-inner quadrant of left female breast: Secondary | ICD-10-CM

## 2022-06-26 DIAGNOSIS — M25612 Stiffness of left shoulder, not elsewhere classified: Secondary | ICD-10-CM

## 2022-06-26 NOTE — Patient Instructions (Signed)

## 2022-06-26 NOTE — Therapy (Signed)
OUTPATIENT PHYSICAL THERAPY  UPPER EXTREMITY ONCOLOGY TREATMENT  Patient Name: Jamie [name] MRN: [id number] DOB:06-21-1934, 88 y.o., female Today's Date: 06/26/2022  END OF SESSION:  PT End of Session - 06/26/22 1222     Visit Number 5    Number of Visits 12    Date for PT Re-Evaluation 07/21/22    PT Start Time 1105    PT Stop Time 1205    PT Time Calculation (min) 60 min    Activity Tolerance Patient tolerated treatment well    Behavior During Therapy [facility] for tasks assessed/performed              Past Medical History:  Diagnosis Date   Abnormality of gait 04/18/2017   Degenerative joint disease of knee, right 03/19/2018   Injected April 18, 2018 Approved for Durolane R knee 3.4.20. Patient given another steroid injection Jun 24, 2018.  Repeat injection September 09, 2018 repeat injection February 27, 2019 repeat 6/110/20221 bilateral injections given December 01, 2019 Right knee injected April 20, 2020 monovisc May 19, 2018 Repeat steroid injection given June 16, 2020 repeat injection August 24, 2020   Essential hypertension 08/14/2016   Generalized anxiety disorder 06/27/2017   History of dizziness 06/27/2017   History of hysterectomy for benign disease 06/27/2017   History of vitamin D deficiency 08/22/2017   Leukocytes in urine 08/14/2016   Lumbar facet arthropathy 06/27/2017   Major neurocognitive disorder due to multiple etiologies 09/29/2020   Mixed hyperlipidemia 08/22/2017   Osteoarthritis of knee 09/18/2017   Pancolitis 08/14/2016   Parkinson's disease 03/13/2017   Prediabetes 08/22/2017   Right knee pain    Spinal stenosis of lumbar region    Weakness of both hips 04/18/2017   White coat syndrome without diagnosis of hypertension 08/22/2017   Past Surgical History:  Procedure Laterality Date   HYSTERECTOMY ABDOMINAL WITH SALPINGECTOMY     MASTECTOMY W/ SENTINEL NODE BIOPSY Left 08/18/2021   Procedure: LEFT MASTECTOMY WITH TARGETED LYMPH NODE DISSECTION;   Surgeon: Abigail Miyamoto, MD;  Location: [facility];  Service: General;  Laterality: Left;   TONSILLECTOMY     Patient Active Problem List   Diagnosis Date Noted   Gastroesophageal reflux disease without esophagitis 03/19/2022   Heartburn 03/19/2022   S/P left mastectomy 08/18/2021   Genetic testing 07/25/2021   Family history of breast cancer 07/14/2021   Malignant neoplasm of lower-inner quadrant of left breast in female, estrogen receptor positive (HCC) 07/11/2021   Mild episode of recurrent major depressive disorder (HCC) 04/06/2021   Age-related osteoporosis without current pathological fracture 04/06/2021   Greater trochanteric bursitis of right hip 01/21/2021   Major neurocognitive disorder due to multiple etiologies 09/29/2020   Right knee pain 05/07/2018   Osteoarthritis of knees, bilateral 03/19/2018   Osteoarthritis of knee 09/18/2017   Mixed hyperlipidemia 08/22/2017   Prediabetes 08/22/2017   White coat syndrome without diagnosis of hypertension 08/22/2017   History of vitamin D deficiency 08/22/2017   Lumbar facet arthropathy 06/27/2017   History of hysterectomy for benign disease 06/27/2017   Generalized anxiety disorder 06/27/2017   History of dizziness 06/27/2017   Other fatigue 06/27/2017   Overweight (BMI 25.0-29.9) 06/27/2017   Weakness of both hips 04/18/2017   Abnormality of gait 04/18/2017   Spinal stenosis of lumbar region 04/18/2017   Parkinson's disease 03/13/2017   Pancolitis 08/14/2016   Essential hypertension 08/14/2016   Leukocytes in urine 08/14/2016       REFERRING PROVIDER: Serena Croissant MD  REFERRING  DIAG: Left UE lymphedema  THERAPY DIAG:  Lymphedema, not elsewhere classified  Aftercare following surgery for neoplasm  Stiffness of left shoulder, not elsewhere classified  Abnormal posture  Malignant neoplasm of lower-inner quadrant of left breast in female, estrogen receptor positive (HCC)  ONSET DATE:  06/12/2022  Rationale for Evaluation and Treatment: Rehabilitation  SUBJECTIVE:                                                                                                                                                                                           SUBJECTIVE STATEMENT:  My kids put my sleeve on for me. It feels ok.   PERTINENT HISTORY:  Patient was diagnosed on 06/27/2021 with left grade 2 invasive ductal carcinoma breast cancer. She underwent a left mastectomy and targeted axillary lymph node dissection (7/7 nodes positive) on 08/18/2021. It is triple positive with a Ki67 of 40%. She had radiation. Patient has cognitive deficits effecting memory and was diagnosed with Parkinson's in 2020   PAIN:    Are you having pain? No chest is just tender, noticed a week ago  PRECAUTIONS: Left UE lymphedema, Parkinsons, neuro cognitive disorder, OA , spinal stenosis, GAD  WEIGHT BEARING RESTRICTIONS: No  FALLS:  Has patient fallen in last 6 months? No  LIVING ENVIRONMENT: Lives with: lives alone, but has a family member with her every night, and they cook dinner for her Lives in: House/apartment Stairs: Yes; External: 4 steps; can reach both Has following equipment at home: Single point cane, Walker - 2 wheeled, and Grab bars  OCCUPATION: NA  LEISURE: read,  HAND DOMINANCE: right   PRIOR LEVEL OF FUNCTION: Independent with basic ADLs  PATIENT GOALS: Learn how to manage swelling   OBJECTIVE:  COGNITION: Overall cognitive status: Impaired   PALPATION: Tender left chest and left lateral trunk  OBSERVATIONS / OTHER ASSESSMENTS: mild redness left prox arm, swelling visible and palpable at left lateral trunk  SENSATION: Light touch: Appears intact    POSTURE: forward head, rounded shoulders  UPPER EXTREMITY AROM/PROM:  A/PROM RIGHT   eval   Shoulder extension   Shoulder flexion 127  Shoulder abduction 124  Shoulder internal rotation   Shoulder external  rotation     (Blank rows = not tested)  A/PROM LEFT   eval  Shoulder extension   Shoulder flexion 126 pulls at ribs  Shoulder abduction 115  Shoulder internal rotation   Shoulder external rotation     (Blank rows = not tested)  CERVICAL AROM: All within functional limits:     UPPER EXTREMITY STRENGTH:   LYMPHEDEMA ASSESSMENTS:   SURGERY TYPE/DATE: 08/18/21 Left Mastectomy  with  NUMBER OF LYMPH NODES REMOVED: 7/7  CHEMOTHERAPY: NO  RADIATION:Yes, completed 11/09/2021  HORMONE TREATMENT: Letrozole  INFECTIONS: NO  Herceptin infusions will end May 14  LYMPHEDEMA ASSESSMENTS:   LANDMARK RIGHT  eval  At axilla  34.5  15 cm proximal to olecranon process 33.8  10 cm proximal to olecranon process 31.5  Olecranon process 26.5  15 cm proximal to ulnar styloid process 25.4  10 cm proximal to ulnar styloid process 22.8  Just proximal to ulnar styloid process 16  Across hand at thumb web space 18.1  At base of 2nd digit 6.0  (Blank rows = not tested)  LANDMARK LEFT  eval  At axilla  33.8  15 cm proximal to olecranon process 32.4  10 cm proximal to olecranon process 31.8  Olecranon process 25.8  15 cm proximal to ulnar styloid process 25  10 cm proximal to ulnar styloid process 22.4  Just proximal to ulnar styloid process 16.1  Across hand at thumb web space 18.1  At base of 2nd digit 6.0  (Blank rows = not tested)   L-DEX LYMPHEDEMA SCREENING: The patient was assessed using the L-Dex machine today to produce a lymphedema index baseline score. The patient will be reassessed on a regular basis (typically every 3 months) to obtain new L-Dex scores. If the score is > 6.5 points away from his/her baseline score indicating onset of subclinical lymphedema, it will be recommended to wear a compression garment for 4 weeks, 12 hours per day and then be reassessed. If the score continues to be > 6.5 points from baseline at reassessment, we will initiate lymphedema treatment.  Assessing in this manner has a 95% rate of preventing clinically significant lymphedema.   LLIS: 6  TODAY'S TREATMENT:                                                                                                                                          DATE:  06/26/22: Manual lymph drainage in supine as follows: short neck, right axillary nodes, left inguinal nodes, superficial and deep abdominals; anterior inter-axillary anastamoses, left axillo-inguinal anastamoses. Left upper extremity from fingers and dorsal hand to lateral shoulder redirecting along pathways. Educated pt and granddaughter throughout and had granddaughter return demonstrate each step while provinding v/c and t/c for sequence, skin stretch and speed.     06/23/2022 PATIENT EDUCATION:  Education details: POC, try compression bra for trunk edema, wash sleeve every 2 days at a minimum Person educated: Patient and granddaughter [name] Education method: Explanation Education comprehension: verbalized understanding  HOME EXERCISE PROGRAM: NA  ASSESSMENT:  CLINICAL IMPRESSION: Pt returns to PT with her granddaughter. Spent session educating pt and granddaughter in correct MLD technique for LUE. Also educated them in proper donning technique for her sleeve. Pt's granddaughter demonstrated excellent skin stretch and speed by end of session. Educated her to do MLD daily for  15-20 min total.   OBJECTIVE IMPAIRMENTS: decreased activity tolerance, decreased cognition, decreased ROM, increased edema, impaired UE functional use, postural dysfunction, and pain.   ACTIVITY LIMITATIONS: carrying, lifting, bathing, and reach over head  PARTICIPATION LIMITATIONS: meal prep, medication management, personal finances, driving, and community activity  PERSONAL FACTORS:Left Breast Cancer s/p radiation, Parkinsons, neuro cognitive disorder, OA , spinal stenosis, GAD are also affecting patient's functional outcome.   REHAB POTENTIAL:  Good  CLINICAL DECISION MAKING: Stable/uncomplicated  EVALUATION COMPLEXITY: Low  GOALS: Goals reviewed with patient? Yes  SHORT TERM GOALS: Target date: 07/21/2022  Pt/family member will be educated in MLD for left UE lymphedema Baseline: Goal status: INITIAL  2.  Pt will have left shoulder ROM equal to left for flexion and abduction Baseline:  Goal status: INITIAL  3.  Pt will have decreased tenderness left chest and trunk area Baseline:  Goal status: INITIAL  4.  Pt will get benefit from compression bra for lateral trunk swelling Baseline:  Goal status: INITIAL     PLAN:  PT FREQUENCY: 2x/week  PT DURATION: 4 weeks  PLANNED INTERVENTIONS: Therapeutic exercises, Patient/Family education, Self Care, Orthotic/Fit training, Manual lymph drainage, Compression bandaging, scar mobilization, Manual therapy, and Re-evaluation  PLAN FOR NEXT SESSION: Did she try compression bra? AAROM supine, instruct family member in MLD to left UE and left lateral trunk, STM to left pectorals/UT prn  Leonette Most, PT 06/26/2022, 12:33 PM

## 2022-06-27 ENCOUNTER — Ambulatory Visit: Payer: Medicare PPO

## 2022-06-27 DIAGNOSIS — I890 Lymphedema, not elsewhere classified: Secondary | ICD-10-CM | POA: Diagnosis not present

## 2022-06-27 DIAGNOSIS — C50312 Malignant neoplasm of lower-inner quadrant of left female breast: Secondary | ICD-10-CM | POA: Diagnosis not present

## 2022-06-27 DIAGNOSIS — Z170 Estrogen receptor positive status [ER+]: Secondary | ICD-10-CM | POA: Diagnosis not present

## 2022-06-27 DIAGNOSIS — Z483 Aftercare following surgery for neoplasm: Secondary | ICD-10-CM | POA: Diagnosis not present

## 2022-06-27 DIAGNOSIS — R293 Abnormal posture: Secondary | ICD-10-CM | POA: Diagnosis not present

## 2022-06-27 DIAGNOSIS — M25612 Stiffness of left shoulder, not elsewhere classified: Secondary | ICD-10-CM

## 2022-06-27 NOTE — Therapy (Signed)
OUTPATIENT PHYSICAL THERAPY  UPPER EXTREMITY ONCOLOGY TREATMENT  Patient Name: Jamie [name] MRN: [id number] DOB:[birth year], 88 y.o., female Today's Date: 06/27/2022  END OF SESSION:  PT End of Session - 06/27/22 1207     Visit Number 6    Number of Visits 12    Date for PT Re-Evaluation 07/21/22    PT Start Time 1208    PT Stop Time 1255    PT Time Calculation (min) 47 min    Activity Tolerance Patient tolerated treatment well    Behavior During Therapy [facility] for tasks assessed/performed              Past Medical History:  Diagnosis Date   Abnormality of gait 04/18/2017   Degenerative joint disease of knee, right 03/19/2018   Injected April 18, 2018 Approved for Durolane R knee 3.4.20. Patient given another steroid injection Jun 24, 2018.  Repeat injection September 09, 2018 repeat injection February 27, 2019 repeat 6/110/20221 bilateral injections given December 01, 2019 Right knee injected April 20, 2020 monovisc May 19, 2018 Repeat steroid injection given June 16, 2020 repeat injection August 24, 2020   Essential hypertension 08/14/2016   Generalized anxiety disorder 06/27/2017   History of dizziness 06/27/2017   History of hysterectomy for benign disease 06/27/2017   History of vitamin D deficiency 08/22/2017   Leukocytes in urine 08/14/2016   Lumbar facet arthropathy 06/27/2017   Major neurocognitive disorder due to multiple etiologies 09/29/2020   Mixed hyperlipidemia 08/22/2017   Osteoarthritis of knee 09/18/2017   Pancolitis 08/14/2016   Parkinson's disease 03/13/2017   Prediabetes 08/22/2017   Right knee pain    Spinal stenosis of lumbar region    Weakness of both hips 04/18/2017   White coat syndrome without diagnosis of hypertension 08/22/2017   Past Surgical History:  Procedure Laterality Date   HYSTERECTOMY ABDOMINAL WITH SALPINGECTOMY     MASTECTOMY W/ SENTINEL NODE BIOPSY Left 08/18/2021   Procedure: LEFT MASTECTOMY WITH TARGETED LYMPH NODE DISSECTION;   Surgeon: Abigail Miyamoto, MD;  Location: [facility];  Service: General;  Laterality: Left;   TONSILLECTOMY     Patient Active Problem List   Diagnosis Date Noted   Gastroesophageal reflux disease without esophagitis 03/19/2022   Heartburn 03/19/2022   S/P left mastectomy 08/18/2021   Genetic testing 07/25/2021   Family history of breast cancer 07/14/2021   Malignant neoplasm of lower-inner quadrant of left breast in female, estrogen receptor positive (HCC) 07/11/2021   Mild episode of recurrent major depressive disorder (HCC) 04/06/2021   Age-related osteoporosis without current pathological fracture 04/06/2021   Greater trochanteric bursitis of right hip 01/21/2021   Major neurocognitive disorder due to multiple etiologies 09/29/2020   Right knee pain 05/07/2018   Osteoarthritis of knees, bilateral 03/19/2018   Osteoarthritis of knee 09/18/2017   Mixed hyperlipidemia 08/22/2017   Prediabetes 08/22/2017   White coat syndrome without diagnosis of hypertension 08/22/2017   History of vitamin D deficiency 08/22/2017   Lumbar facet arthropathy 06/27/2017   History of hysterectomy for benign disease 06/27/2017   Generalized anxiety disorder 06/27/2017   History of dizziness 06/27/2017   Other fatigue 06/27/2017   Overweight (BMI 25.0-29.9) 06/27/2017   Weakness of both hips 04/18/2017   Abnormality of gait 04/18/2017   Spinal stenosis of lumbar region 04/18/2017   Parkinson's disease 03/13/2017   Pancolitis 08/14/2016   Essential hypertension 08/14/2016   Leukocytes in urine 08/14/2016       REFERRING PROVIDER: Serena Croissant MD  REFERRING  DIAG: Left UE lymphedema  THERAPY DIAG:  Lymphedema, not elsewhere classified  Aftercare following surgery for neoplasm  Stiffness of left shoulder, not elsewhere classified  Malignant neoplasm of lower-inner quadrant of left breast in female, estrogen receptor positive (HCC)  ONSET DATE: 06/12/2022  Rationale for  Evaluation and Treatment: Rehabilitation  SUBJECTIVE:                                                                                                                                                                                           SUBJECTIVE STATEMENT:  No complaints today. Pts grandaughter notes she felt she did well with MLD last visit  PERTINENT HISTORY:  Patient was diagnosed on 06/27/2021 with left grade 2 invasive ductal carcinoma breast cancer. She underwent a left mastectomy and targeted axillary lymph node dissection (7/7 nodes positive) on 08/18/2021. It is triple positive with a Ki67 of 40%. She had radiation. Patient has cognitive deficits effecting memory and was diagnosed with Parkinson's in 2020   PAIN:    Are you having pain? No chest is just tender, noticed a week ago  PRECAUTIONS: Left UE lymphedema, Parkinsons, neuro cognitive disorder, OA , spinal stenosis, GAD  WEIGHT BEARING RESTRICTIONS: No  FALLS:  Has patient fallen in last 6 months? No  LIVING ENVIRONMENT: Lives with: lives alone, but has a family member with her every night, and they cook dinner for her Lives in: House/apartment Stairs: Yes; External: 4 steps; can reach both Has following equipment at home: Single point cane, Walker - 2 wheeled, and Grab bars  OCCUPATION: NA  LEISURE: read,  HAND DOMINANCE: right   PRIOR LEVEL OF FUNCTION: Independent with basic ADLs  PATIENT GOALS: Learn how to manage swelling   OBJECTIVE:  COGNITION: Overall cognitive status: Impaired   PALPATION: Tender left chest and left lateral trunk  OBSERVATIONS / OTHER ASSESSMENTS: mild redness left prox arm, swelling visible and palpable at left lateral trunk  SENSATION: Light touch: Appears intact    POSTURE: forward head, rounded shoulders  UPPER EXTREMITY AROM/PROM:  A/PROM RIGHT   eval   Shoulder extension   Shoulder flexion 127  Shoulder abduction 124  Shoulder internal rotation   Shoulder  external rotation     (Blank rows = not tested)  A/PROM LEFT   eval  Shoulder extension   Shoulder flexion 126 pulls at ribs  Shoulder abduction 115  Shoulder internal rotation   Shoulder external rotation     (Blank rows = not tested)  CERVICAL AROM: All within functional limits:     UPPER EXTREMITY STRENGTH:   LYMPHEDEMA ASSESSMENTS:   SURGERY TYPE/DATE: 08/18/21 Left Mastectomy  with  NUMBER OF LYMPH NODES REMOVED: 7/7  CHEMOTHERAPY: NO  RADIATION:Yes, completed 11/09/2021  HORMONE TREATMENT: Letrozole  INFECTIONS: NO  Herceptin infusions will end May 14  LYMPHEDEMA ASSESSMENTS:   LANDMARK RIGHT  eval  At axilla  34.5  15 cm proximal to olecranon process 33.8  10 cm proximal to olecranon process 31.5  Olecranon process 26.5  15 cm proximal to ulnar styloid process 25.4  10 cm proximal to ulnar styloid process 22.8  Just proximal to ulnar styloid process 16  Across hand at thumb web space 18.1  At base of 2nd digit 6.0  (Blank rows = not tested)  LANDMARK LEFT  eval  At axilla  33.8  15 cm proximal to olecranon process 32.4  10 cm proximal to olecranon process 31.8  Olecranon process 25.8  15 cm proximal to ulnar styloid process 25  10 cm proximal to ulnar styloid process 22.4  Just proximal to ulnar styloid process 16.1  Across hand at thumb web space 18.1  At base of 2nd digit 6.0  (Blank rows = not tested)   L-DEX LYMPHEDEMA SCREENING: The patient was assessed using the L-Dex machine today to produce a lymphedema index baseline score. The patient will be reassessed on a regular basis (typically every 3 months) to obtain new L-Dex scores. If the score is > 6.5 points away from his/her baseline score indicating onset of subclinical lymphedema, it will be recommended to wear a compression garment for 4 weeks, 12 hours per day and then be reassessed. If the score continues to be > 6.5 points from baseline at reassessment, we will initiate lymphedema  treatment. Assessing in this manner has a 95% rate of preventing clinically significant lymphedema.   LLIS: 6  TODAY'S TREATMENT:      06/27/2022 Pts grand daughter [name] performed all steps of left UE MLD Manual lymph drainage in supine as follows: short neck, right axillary nodes, left inguinal nodes, 5 Breaths by pt; anterior inter-axillary anastamoses, left axillo-inguinal anastamoses. Left upper extremitystarting proximally at outer upper arm, inner to outer the outer arm again,retracing pathways, then forearm, hand and fingers retracing all pathways and ending with LN's from   Gave written handout                                                                                                                                     DATE:  06/26/22: Manual lymph drainage in supine as follows: short neck, right axillary nodes, left inguinal nodes, superficial and deep abdominals; anterior inter-axillary anastamoses, left axillo-inguinal anastamoses. Left upper extremity from fingers and dorsal hand to lateral shoulder redirecting along pathways. Educated pt and granddaughter throughout and had granddaughter return demonstrate each step while provinding v/c and t/c for sequence, skin stretch and speed.     06/23/2022 PATIENT EDUCATION:  Education details: POC, try compression bra for trunk edema, wash sleeve every 2 days at a minimum  Person educated: Patient and granddaughter [name] Education method: Explanation Education comprehension: verbalized understanding  HOME EXERCISE PROGRAM: NA  ASSESSMENT:  CLINICAL IMPRESSION: Pts grand daughter did an excellent job performing MLD on Pt. She used good pressure and stretch, but required occasional VC's for hand positioning. Pt was mildly tender at left chest region and will benefit from gentle AAROM and possible STM. Pt had on a compression vest but it was way to big to provide compression to the lateral trunk. OBJECTIVE IMPAIRMENTS: decreased activity  tolerance, decreased cognition, decreased ROM, increased edema, impaired UE functional use, postural dysfunction, and pain.   ACTIVITY LIMITATIONS: carrying, lifting, bathing, and reach over head  PARTICIPATION LIMITATIONS: meal prep, medication management, personal finances, driving, and community activity  PERSONAL FACTORS:Left Breast Cancer s/p radiation, Parkinsons, neuro cognitive disorder, OA , spinal stenosis, GAD are also affecting patient's functional outcome.   REHAB POTENTIAL: Good  CLINICAL DECISION MAKING: Stable/uncomplicated  EVALUATION COMPLEXITY: Low  GOALS: Goals reviewed with patient? Yes  SHORT TERM GOALS: Target date: 07/21/2022  Pt/family member will be educated in MLD for left UE lymphedema Baseline: Goal status: INITIAL  2.  Pt will have left shoulder ROM equal to left for flexion and abduction Baseline:  Goal status: INITIAL  3.  Pt will have decreased tenderness left chest and trunk area Baseline:  Goal status: INITIAL  4.  Pt will get benefit from compression bra for lateral trunk swelling Baseline:  Goal status: INITIAL     PLAN:  PT FREQUENCY: 2x/week  PT DURATION: 4 weeks  PLANNED INTERVENTIONS: Therapeutic exercises, Patient/Family education, Self Care, Orthotic/Fit training, Manual lymph drainage, Compression bandaging, scar mobilization, Manual therapy, and Re-evaluation  PLAN FOR NEXT SESSION: Did she try compression bra? AAROM supine, instruct family member in MLD to left UE and left lateral trunk, STM to left pectorals/UT prn  Waynette Buttery, PT 06/27/2022, 1:10 PM

## 2022-07-03 ENCOUNTER — Encounter: Payer: Self-pay | Admitting: Rehabilitation

## 2022-07-03 ENCOUNTER — Ambulatory Visit: Payer: Medicare PPO | Admitting: Rehabilitation

## 2022-07-03 DIAGNOSIS — R293 Abnormal posture: Secondary | ICD-10-CM | POA: Diagnosis not present

## 2022-07-03 DIAGNOSIS — M25612 Stiffness of left shoulder, not elsewhere classified: Secondary | ICD-10-CM | POA: Diagnosis not present

## 2022-07-03 DIAGNOSIS — I890 Lymphedema, not elsewhere classified: Secondary | ICD-10-CM | POA: Diagnosis not present

## 2022-07-03 DIAGNOSIS — Z483 Aftercare following surgery for neoplasm: Secondary | ICD-10-CM | POA: Diagnosis not present

## 2022-07-03 DIAGNOSIS — C50312 Malignant neoplasm of lower-inner quadrant of left female breast: Secondary | ICD-10-CM | POA: Diagnosis not present

## 2022-07-03 DIAGNOSIS — Z170 Estrogen receptor positive status [ER+]: Secondary | ICD-10-CM

## 2022-07-03 NOTE — Therapy (Signed)
OUTPATIENT PHYSICAL THERAPY  UPPER EXTREMITY ONCOLOGY TREATMENT  Patient Name: Jamie [name] MRN: [id number] DOB:January 17, 1935, 88 y.o., female Today's Date: 07/04/2022  END OF SESSION:  PT End of Session - 07/03/22 0948     Visit Number 7    Number of Visits 12    Date for PT Re-Evaluation 07/21/22    PT Start Time 0909    PT Stop Time 0955    PT Time Calculation (min) 46 min    Activity Tolerance Patient tolerated treatment well    Behavior During Therapy [facility] for tasks assessed/performed               Past Medical History:  Diagnosis Date   Abnormality of gait 04/18/2017   Degenerative joint disease of knee, right 03/19/2018   Injected April 18, 2018 Approved for Durolane R knee 3.4.20. Patient given another steroid injection Jun 24, 2018.  Repeat injection September 09, 2018 repeat injection February 27, 2019 repeat 6/110/20221 bilateral injections given December 01, 2019 Right knee injected April 20, 2020 monovisc May 19, 2018 Repeat steroid injection given June 16, 2020 repeat injection August 24, 2020   Essential hypertension 08/14/2016   Generalized anxiety disorder 06/27/2017   History of dizziness 06/27/2017   History of hysterectomy for benign disease 06/27/2017   History of vitamin D deficiency 08/22/2017   Leukocytes in urine 08/14/2016   Lumbar facet arthropathy 06/27/2017   Major neurocognitive disorder due to multiple etiologies 09/29/2020   Mixed hyperlipidemia 08/22/2017   Osteoarthritis of knee 09/18/2017   Pancolitis 08/14/2016   Parkinson's disease 03/13/2017   Prediabetes 08/22/2017   Right knee pain    Spinal stenosis of lumbar region    Weakness of both hips 04/18/2017   White coat syndrome without diagnosis of hypertension 08/22/2017   Past Surgical History:  Procedure Laterality Date   HYSTERECTOMY ABDOMINAL WITH SALPINGECTOMY     MASTECTOMY W/ SENTINEL NODE BIOPSY Left 08/18/2021   Procedure: LEFT MASTECTOMY WITH TARGETED LYMPH NODE DISSECTION;   Surgeon: Jamie Miyamoto, MD;  Location: [facility];  Service: General;  Laterality: Left;   TONSILLECTOMY     Patient Active Problem List   Diagnosis Date Noted   Gastroesophageal reflux disease without esophagitis 03/19/2022   Heartburn 03/19/2022   S/P left mastectomy 08/18/2021   Genetic testing 07/25/2021   Family history of breast cancer 07/14/2021   Malignant neoplasm of lower-inner quadrant of left breast in female, estrogen receptor positive (HCC) 07/11/2021   Mild episode of recurrent major depressive disorder (HCC) 04/06/2021   Age-related osteoporosis without current pathological fracture 04/06/2021   Greater trochanteric bursitis of right hip 01/21/2021   Major neurocognitive disorder due to multiple etiologies 09/29/2020   Right knee pain 05/07/2018   Osteoarthritis of knees, bilateral 03/19/2018   Osteoarthritis of knee 09/18/2017   Mixed hyperlipidemia 08/22/2017   Prediabetes 08/22/2017   White coat syndrome without diagnosis of hypertension 08/22/2017   History of vitamin D deficiency 08/22/2017   Lumbar facet arthropathy 06/27/2017   History of hysterectomy for benign disease 06/27/2017   Generalized anxiety disorder 06/27/2017   History of dizziness 06/27/2017   Other fatigue 06/27/2017   Overweight (BMI 25.0-29.9) 06/27/2017   Weakness of both hips 04/18/2017   Abnormality of gait 04/18/2017   Spinal stenosis of lumbar region 04/18/2017   Parkinson's disease 03/13/2017   Pancolitis 08/14/2016   Essential hypertension 08/14/2016   Leukocytes in urine 08/14/2016       REFERRING PROVIDER: Serena Croissant MD  REFERRING DIAG: Left UE lymphedema  THERAPY DIAG:  Lymphedema, not elsewhere classified  Aftercare following surgery for neoplasm  Stiffness of left shoulder, not elsewhere classified  Malignant neoplasm of lower-inner quadrant of left breast in female, estrogen receptor positive (HCC)  Abnormal posture  ONSET DATE:  06/12/2022  Rationale for Evaluation and Treatment: Rehabilitation  SUBJECTIVE:                                                                                                                                                                                           SUBJECTIVE STATEMENT:  She is doing fine at the arm but the trunk is still tender. Jamie [name] denies any questions on MLD at this point.   PERTINENT HISTORY:  Patient was diagnosed on 06/27/2021 with left grade 2 invasive ductal carcinoma breast cancer. She underwent a left mastectomy and targeted axillary lymph node dissection (7/7 nodes positive) on 08/18/2021. It is triple positive with a Ki67 of 40%. She had radiation. Patient has cognitive deficits effecting memory and was diagnosed with Parkinson's in 2020   PAIN:  Are you having pain? No chest is just tender, noticed a week ago  PRECAUTIONS: Left UE lymphedema, Parkinsons, neuro cognitive disorder, OA , spinal stenosis, GAD  WEIGHT BEARING RESTRICTIONS: No  FALLS:  Has patient fallen in last 6 months? No  LIVING ENVIRONMENT: Lives with: lives alone, but has a family member with her every night, and they cook dinner for her Lives in: House/apartment Stairs: Yes; External: 4 steps; can reach both Has following equipment at home: Single point cane, Walker - 2 wheeled, and Grab bars  OCCUPATION: NA  LEISURE: read,  HAND DOMINANCE: right   PRIOR LEVEL OF FUNCTION: Independent with basic ADLs  PATIENT GOALS: Learn how to manage swelling   OBJECTIVE:  COGNITION: Overall cognitive status: Impaired   PALPATION: Tender left chest and left lateral trunk  OBSERVATIONS / OTHER ASSESSMENTS: mild redness left prox arm, swelling visible and palpable at left lateral trunk  SENSATION: Light touch: Appears intact    POSTURE: forward head, rounded shoulders  UPPER EXTREMITY AROM/PROM:  A/PROM RIGHT   eval   Shoulder extension   Shoulder flexion 127  Shoulder abduction  124  Shoulder internal rotation   Shoulder external rotation     (Blank rows = not tested)  A/PROM LEFT   eval  Shoulder extension   Shoulder flexion 126 pulls at ribs  Shoulder abduction 115  Shoulder internal rotation   Shoulder external rotation     (Blank rows = not tested)  CERVICAL AROM: All within functional limits:  UPPER EXTREMITY STRENGTH:   LYMPHEDEMA ASSESSMENTS:  SURGERY TYPE/DATE: 08/18/21 Left Mastectomy with NUMBER OF LYMPH NODES REMOVED: 7/7 CHEMOTHERAPY: NO RADIATION:Yes, completed 11/09/2021 HORMONE TREATMENT: Letrozole INFECTIONS: NO Herceptin infusions will end May 14  LYMPHEDEMA ASSESSMENTS:   LANDMARK RIGHT  eval  At axilla  34.5  15 cm proximal to olecranon process 33.8  10 cm proximal to olecranon process 31.5  Olecranon process 26.5  15 cm proximal to ulnar styloid process 25.4  10 cm proximal to ulnar styloid process 22.8  Just proximal to ulnar styloid process 16  Across hand at thumb web space 18.1  At base of 2nd digit 6.0  (Blank rows = not tested)  LANDMARK LEFT  eval  At axilla  33.8  15 cm proximal to olecranon process 32.4  10 cm proximal to olecranon process 31.8  Olecranon process 25.8  15 cm proximal to ulnar styloid process 25  10 cm proximal to ulnar styloid process 22.4  Just proximal to ulnar styloid process 16.1  Across hand at thumb web space 18.1  At base of 2nd digit 6.0  (Blank rows = not tested)   L-DEX LYMPHEDEMA SCREENING: The patient was assessed using the L-Dex machine today to produce a lymphedema index baseline score. The patient will be reassessed on a regular basis (typically every 3 months) to obtain new L-Dex scores. If the score is > 6.5 points away from his/her baseline score indicating onset of subclinical lymphedema, it will be recommended to wear a compression garment for 4 weeks, 12 hours per day and then be reassessed. If the score continues to be > 6.5 points from baseline at reassessment, we will  initiate lymphedema treatment. Assessing in this manner has a 95% rate of preventing clinically significant lymphedema.  LLIS: 6  TODAY'S TREATMENT:     07/03/22 No questions regarding MLD by Jamie [name]  Supine PROM into flexion, abduction, ER to tolerance - pt reporting no pain STM pectoralis and lateral trunk gentle pressure Reviewed post-op stretches and gave handout with perofrmance of each x 3 Gave new bra prescription  06/27/2022 Pts grand daughter Jamie [name] performed all steps of left UE MLD Manual lymph drainage in supine as follows: short neck, right axillary nodes, left inguinal nodes, 5 Breaths by pt; anterior inter-axillary anastamoses, left axillo-inguinal anastamoses. Left upper extremitystarting proximally at outer upper arm, inner to outer the outer arm again,retracing pathways, then forearm, hand and fingers retracing all pathways and ending with LN's from   Gave written handout                                                                                                                                     DATE:  06/26/22: Manual lymph drainage in supine as follows: short neck, right axillary nodes, left inguinal nodes, superficial and deep abdominals; anterior inter-axillary anastamoses, left axillo-inguinal anastamoses. Left upper extremity from fingers and dorsal hand to lateral shoulder redirecting along pathways. Educated pt and granddaughter throughout and  had granddaughter return demonstrate each step while provinding v/c and t/c for sequence, skin stretch and speed.     06/23/2022 PATIENT EDUCATION:  Education details: POC, try compression bra for trunk edema, wash sleeve every 2 days at a minimum Person educated: Patient and granddaughter Jamie [name] Education method: Explanation Education comprehension: verbalized understanding  HOME EXERCISE PROGRAM: Self MLD, post op exercises,   ASSESSMENT:  CLINICAL IMPRESSION: Pts grand daughter is doing an excellent job performing MLD on Pt.  Focused more on STM/PROM today per pt request and pt has +1 ttp lateral trunk but overall reports that her arm does not bother her.  She tolerated ROM well and Jamie [name] said she would feel comfortable doing PROM at home as she had done this before for her mom.   OBJECTIVE IMPAIRMENTS: decreased activity tolerance, decreased cognition, decreased ROM, increased edema, impaired UE functional use, postural dysfunction, and pain.   ACTIVITY LIMITATIONS: carrying, lifting, bathing, and reach over head  PARTICIPATION LIMITATIONS: meal prep, medication management, personal finances, driving, and community activity  PERSONAL FACTORS:Left Breast Cancer s/p radiation, Parkinsons, neuro cognitive disorder, OA , spinal stenosis, GAD are also affecting patient's functional outcome.   REHAB POTENTIAL: Good  CLINICAL DECISION MAKING: Stable/uncomplicated  EVALUATION COMPLEXITY: Low  GOALS: Goals reviewed with patient? Yes  SHORT TERM GOALS: Target date: 07/21/2022  Pt/family member will be educated in MLD for left UE lymphedema Baseline: Goal status: MET  2.  Pt will have left shoulder ROM equal to left for flexion and abduction Baseline:  Goal status: INITIAL  3.  Pt will have decreased tenderness left chest and trunk area Baseline:  Goal status: INITIAL  4.  Pt will get benefit from compression bra for lateral trunk swelling Baseline:  Goal status: INITIAL     PLAN:  PT FREQUENCY: 2x/week  PT DURATION: 4 weeks  PLANNED INTERVENTIONS: Therapeutic exercises, Patient/Family education, Self Care, Orthotic/Fit training, Manual lymph drainage, Compression bandaging, scar mobilization, Manual therapy, and Re-evaluation  PLAN FOR NEXT SESSION: Did she try compression bra? (Doesn't have one yet- tried to find it) AAROM supine, instruct family member in MLD to left UE and left lateral trunk, STM to left pectorals/UT prn  Idamae Lusher, PT 07/04/2022, 1:00 PM

## 2022-07-03 NOTE — Progress Notes (Signed)
Patient Care Team: Mayer Masker, PA-C (Inactive) as PCP - General (Physician Assistant) Othella Boyer, MD as Consulting Physician (Cardiology) Bufford Buttner, MD as Referring Physician (Dermatology) Rehabilitation, [place], Octaviano Batty, DO as Consulting Physician (Neurology) Pershing Proud, RN as Oncology Nurse Navigator Rogelia Boga, Eileen Stanford, RN as Oncology Nurse Navigator Abigail Miyamoto, MD as Consulting Physician (General Surgery) Serena Croissant, MD as Consulting Physician (Hematology and Oncology) Lonie Peak, MD as Attending Physician (Radiation Oncology) Mayer Masker, PA-C (Inactive) as Physician Assistant (Physician Assistant)  DIAGNOSIS: No diagnosis found.  SUMMARY OF ONCOLOGIC HISTORY: Oncology History  Malignant neoplasm of lower-inner quadrant of left breast in female, estrogen receptor positive (HCC)  07/11/2021 Initial Diagnosis   Skin changes in the left breast, screening mammogram revealed thickening of the lower medial quadrant along with enlarged lymph node in the axilla (2 additional lymph nodes) by ultrasound breast tumor measured 3.9 cm, biopsy: Grade 2 IDC with lymphovascular invasion ER 100%, PR 100%, HER2 positive Ki-67 40%, lymph node biopsy positive   07/13/2021 Cancer Staging   Staging form: Breast, AJCC 8th Edition - Clinical: Stage IB (cT2, cN1, cM0, G2, ER+, PR+, HER2+) - Signed by Serena Croissant, MD on 07/13/2021 Stage prefix: Initial diagnosis Histologic grading system: 3 grade system    Genetic Testing   [employer] CustomNext Panel was Negative. Report date is 07/24/2021.  The CustomNext panel offered by [employer] includes sequencing, rearrangement, and RNA analysis for the following 41 genes: APC, ATM, BAP1, BARD1, BMPR1A, BRCA1, BRCA2, BRIP1, CDH1, CDK4, CDKN2A, CHEK2, DICER1, MLH1, MSH2, MSH6, MUTYH, NBN, NF1, NTHL1, PALB2, PMS2, POT1, PTEN, RAD51C, RAD51D, RB1, RECQL, SMAD4, SMARCA4, STK11 and TP53 (sequencing and  deletion/duplication); AXIN2, EGFR, HOXB13, MITF, MSH3, POLD1 and POLE (sequencing only); EPCAM and GREM1 (deletion/duplication only).    09/14/2021 - 10/04/2021 Chemotherapy   Patient is on Treatment Plan : BREAST Trastuzumab q21d     10/25/2021 -  Chemotherapy   Patient is on Treatment Plan : BREAST MAINTENANCE Trastuzumab IV (6) or SQ (600) D1 q21d X 11 Cycles       CHIEF COMPLIANT: Follow-up herceptin    INTERVAL HISTORY: Jamie [name] is a 88 y.o. female is here because of  of left breast cancer. Currently on herceptin and letrozole. She presents to the clinic today for a follow-up.    ALLERGIES:  is allergic to flagyl [metronidazole].  MEDICATIONS:  Current Outpatient Medications  Medication Sig Dispense Refill   Biotin 1 MG CAPS      carbidopa-levodopa (SINEMET CR) 50-200 MG tablet Take 1 tablet by mouth at bedtime. 90 tablet 1   carbidopa-levodopa (SINEMET IR) 25-100 MG tablet Take 2 tablets by mouth 3 (three) times daily. (Patient taking differently: Take 2 tablets by mouth in the morning, at noon, in the evening, and at bedtime. 4 times / day) 540 tablet 2   cholecalciferol (VITAMIN D3) 25 MCG (1000 UNIT) tablet Take 2,000 Units by mouth daily.     cyanocobalamin (VITAMIN B12) 500 MCG tablet      donepezil (ARICEPT) 5 MG tablet Take 1 tablet (5 mg total) by mouth at bedtime. 90 tablet 1   letrozole (FEMARA) 2.5 MG tablet Take 1 tablet (2.5 mg total) by mouth daily. 90 tablet 3   mirtazapine (REMERON) 15 MG tablet Take 1 tablet (15 mg total) by mouth at bedtime. 90 tablet 1   Multiple Vitamins-Minerals (PRESERVISION AREDS 2 PO) Take 1 tablet by mouth in the morning and at bedtime.  NON FORMULARY Herblax as needed     NON FORMULARY Cholesterol Reduction     omeprazole (PRILOSEC) 20 MG capsule TAKE 1 CAPSULE BY MOUTH DAILY AT 5:30 PM 30 capsule 2   rosuvastatin (CRESTOR) 5 MG tablet Take 1 tablet (5 mg total) by mouth at bedtime. 90 tablet 3   Vitamin E 100 units TABS Take  250 mg by mouth. Once a day     No current facility-administered medications for this visit.    PHYSICAL EXAMINATION: ECOG PERFORMANCE STATUS: {CHL ONC ECOG PS:[id number]}  There were no vitals filed for this visit. There were no vitals filed for this visit.  BREAST:*** No palpable masses or nodules in either right or left breasts. No palpable axillary supraclavicular or infraclavicular adenopathy no breast tenderness or nipple discharge. (exam performed in the presence of a chaperone)  LABORATORY DATA:  I have reviewed the data as listed    Latest Ref Rng & Units 09/12/2021    2:21 PM 08/03/2021    8:55 AM 07/13/2021    8:31 AM  CMP  Glucose 70 - 99 mg/dL  191  478   BUN 8 - 27 mg/dL  22  21   Creatinine 2.95 - 1.00 mg/dL 6.21  3.08  6.57   Sodium 134 - 144 mmol/L  141  140   Potassium 3.5 - 5.2 mmol/L  4.4  4.0   Chloride 96 - 106 mmol/L  106  107   CO2 20 - 29 mmol/L  22  28   Calcium 8.7 - 10.3 mg/dL  84.6  96.2   Total Protein 6.0 - 8.5 g/dL  6.6  7.3   Total Bilirubin 0.0 - 1.2 mg/dL  0.5  0.6   Alkaline Phos 44 - 121 IU/L  83  81   AST 0 - 40 IU/L  21  18   ALT 0 - 32 IU/L  8  18     Lab Results  Component Value Date   WBC 6.9 08/03/2021   HGB 13.4 08/03/2021   HCT 39.9 08/03/2021   MCV 92 08/03/2021   PLT 202 08/03/2021   NEUTROABS 4.5 07/13/2021    ASSESSMENT & PLAN:  No problem-specific Assessment & Plan notes found for this encounter.    No orders of the defined types were placed in this encounter.  The patient has a good understanding of the overall plan. she agrees with it. she will call with any problems that may develop before the next visit here. Total time spent: 30 mins including face to face time and time spent for planning, charting and co-ordination of care   Sherlyn Lick, CMA 07/03/22    I Janan Ridge am acting as a Neurosurgeon for [employer]  ***

## 2022-07-04 ENCOUNTER — Other Ambulatory Visit: Payer: Self-pay

## 2022-07-04 ENCOUNTER — Inpatient Hospital Stay: Payer: Medicare PPO | Admitting: Hematology and Oncology

## 2022-07-04 ENCOUNTER — Encounter: Payer: Self-pay | Admitting: *Deleted

## 2022-07-04 ENCOUNTER — Inpatient Hospital Stay: Payer: Medicare PPO | Attending: Hematology and Oncology

## 2022-07-04 VITALS — BP 155/77 | HR 80 | Temp 97.6°F | Resp 18 | Ht 68.0 in | Wt 178.3 lb

## 2022-07-04 DIAGNOSIS — Z923 Personal history of irradiation: Secondary | ICD-10-CM | POA: Diagnosis not present

## 2022-07-04 DIAGNOSIS — Z170 Estrogen receptor positive status [ER+]: Secondary | ICD-10-CM

## 2022-07-04 DIAGNOSIS — Z79899 Other long term (current) drug therapy: Secondary | ICD-10-CM | POA: Insufficient documentation

## 2022-07-04 DIAGNOSIS — Z5112 Encounter for antineoplastic immunotherapy: Secondary | ICD-10-CM | POA: Diagnosis not present

## 2022-07-04 DIAGNOSIS — Z79811 Long term (current) use of aromatase inhibitors: Secondary | ICD-10-CM | POA: Diagnosis not present

## 2022-07-04 DIAGNOSIS — C50312 Malignant neoplasm of lower-inner quadrant of left female breast: Secondary | ICD-10-CM | POA: Diagnosis not present

## 2022-07-04 MED ORDER — TRASTUZUMAB-HYALURONIDASE-OYSK 600-10000 MG-UNT/5ML ~~LOC~~ SOLN
600.0000 mg | Freq: Once | SUBCUTANEOUS | Status: AC
  Administered 2022-07-04: 600 mg via SUBCUTANEOUS
  Filled 2022-07-04: qty 5

## 2022-07-04 MED ORDER — DIPHENHYDRAMINE HCL 25 MG PO CAPS: 25.0000 mg | ORAL_CAPSULE | Freq: Once | ORAL | Status: DC

## 2022-07-04 NOTE — Patient Instructions (Signed)
[facility]  Discharge Instructions: Thank you for choosing [facility] to provide your oncology and hematology care.   If you have a lab appointment with the Cancer Center, please go directly to the Cancer Center and check in at the registration area.   Wear comfortable clothing and clothing appropriate for easy access to any Portacath or PICC line.   We strive to give you quality time with your provider. You may need to reschedule your appointment if you arrive late (15 or more minutes).  Arriving late affects you and other patients whose appointments are after yours.  Also, if you miss three or more appointments without notifying the office, you may be dismissed from the clinic at the provider's discretion.      For prescription refill requests, have your pharmacy contact our office and allow 72 hours for refills to be completed.    Today you received the following chemotherapy and/or immunotherapy agent: Trastuzumab (Herceptin Hylecta)Trastuzumab; Hyaluronidase Injection What is this medication? TRASTUZUMAB; HYALURONIDASE (tras TOO zoo mab; hye al ur ON i dase) treats breast cancer. Trastuzumab works by blocking a protein that causes cancer cells to grow and multiply. This helps to slow or stop the spread of cancer cells. Hyaluronidase works by increasing the absorption of other medications in the body to help them work better. It is a combination medication that contains a monoclonal antibody. This medicine may be used for other purposes; ask your health care provider or pharmacist if you have questions. COMMON BRAND NAME(S): HERCEPTIN HYLECTA What should I tell my care team before I take this medication? They need to know if you have any of these conditions: Heart failure Lung disease An unusual or allergic reaction to trastuzumab, or other medications, foods, dyes, or preservatives Pregnant or trying to get pregnant Breast-feeding How  should I use this medication? This medication is injected under the skin. It is given by your care team in a hospital or clinic setting. Talk to your care team about the use of this medication in children. It is not approved for use in children. Overdosage: If you think you have taken too much of this medicine contact a poison control center or emergency room at once. NOTE: This medicine is only for you. Do not share this medicine with others. What if I miss a dose? Keep appointments for follow-up doses. It is important not to miss your dose. Call your care team if you are unable to keep an appointment. What may interact with this medication? Certain types of chemotherapy, such as daunorubicin, doxorubicin, epirubicin, idarubicin This list may not describe all possible interactions. Give your health care provider a list of all the medicines, herbs, non-prescription drugs, or dietary supplements you use. Also tell them if you smoke, drink alcohol, or use illegal drugs. Some items may interact with your medicine. What should I watch for while using this medication? Your condition will be monitored carefully while you are receiving this medication. This medication may make you feel generally unwell. This is not uncommon as chemotherapy can affect healthy cells as well as cancer cells. Report any side effects. Continue your course of treatment even though you feel ill unless your care team tells you to stop. This medication may increase your risk of getting an infection. Call your care team for advice if you get a fever, chills, sore throat, or other symptoms of a cold or flu. Do not treat yourself. Try to avoid being  around people who are sick. Avoid taking medications that contain aspirin, acetaminophen, ibuprofen, naproxen, or ketoprofen unless instructed by your care team. These medications may hide a fever. Talk to your care team if you may be pregnant. Serious birth defects can occur if you take  this medication during pregnancy and for 7 months after the last dose. You will need a negative pregnancy test before starting this medication. Contraception is recommended while taking this medication and for 7 months after the last dose. Your care team can help you find the option that works for you. Do not breastfeed while taking this medication or for 7 months after the last dose. What side effects may I notice from receiving this medication? Side effects that you should report to your care team as soon as possible: Allergic reactions or angioedema--skin rash, itching or hives, swelling of the face, eyes, lips, tongue, arms, or legs, trouble swallowing or breathing Dry cough, shortness of breath or trouble breathing Heart failure--shortness of breath, swelling of the ankles, feet, or hands, sudden weight gain, unusual weakness or fatigue Heart rhythm changes--fast or irregular heartbeat, dizziness, feeling faint or lightheaded, chest pain, trouble breathing Increase in blood pressure Infection--fever, chills, cough, or sore throat Side effects that usually do not require medical attention (report these to your care team if they continue or are bothersome): Diarrhea Hair loss Headache Muscle pain Nausea Unusual weakness or fatigue This list may not describe all possible side effects. Call your doctor for medical advice about side effects. You may report side effects to FDA at 1-800-FDA-1088. Where should I keep my medication? This medication is given in a hospital or clinic. It will not be stored at home. NOTE: This sheet is a summary. It may not cover all possible information. If you have questions about this medicine, talk to your doctor, pharmacist, or health care provider.  2023 Elsevier/Gold Standard (2021-06-21 00:00:00)    To help prevent nausea and vomiting after your treatment, we encourage you to take your nausea medication as directed.  BELOW ARE SYMPTOMS THAT SHOULD BE  REPORTED IMMEDIATELY: *FEVER GREATER THAN 100.4 F (38 C) OR HIGHER *CHILLS OR SWEATING *NAUSEA AND VOMITING THAT IS NOT CONTROLLED WITH YOUR NAUSEA MEDICATION *UNUSUAL SHORTNESS OF BREATH *UNUSUAL BRUISING OR BLEEDING *URINARY PROBLEMS (pain or burning when urinating, or frequent urination) *BOWEL PROBLEMS (unusual diarrhea, constipation, pain near the anus) TENDERNESS IN MOUTH AND THROAT WITH OR WITHOUT PRESENCE OF ULCERS (sore throat, sores in mouth, or a toothache) UNUSUAL RASH, SWELLING OR PAIN  UNUSUAL VAGINAL DISCHARGE OR ITCHING   Items with * indicate a potential emergency and should be followed up as soon as possible or go to the Emergency Department if any problems should occur.  Please show the CHEMOTHERAPY ALERT CARD or IMMUNOTHERAPY ALERT CARD at check-in to the Emergency Department and triage nurse.  Should you have questions after your visit or need to cancel or reschedule your appointment, please contact [facility]  Dept: [phone]  and follow the prompts.  Office hours are 8:00 a.m. to 4:30 p.m. Monday - Friday. Please note that voicemails left after 4:00 p.m. may not be returned until the following business day.  We are closed weekends and major holidays. You have access to a nurse at all times for urgent questions. Please call the main number to the clinic Dept: [phone] and follow the prompts.   For any non-urgent questions, you may also contact your provider using MyChart. We now  offer e-Visits for anyone 74 and older to request care online for non-urgent symptoms. For details visit [email or web address].   Also download the MyChart app! Go to the app store, search "MyChart", open the app, select [facility], and log in with your MyChart username and password.

## 2022-07-04 NOTE — Assessment & Plan Note (Signed)
07/09/2021:Skin changes in the left breast, screening mammogram revealed thickening of the lower medial quadrant along with enlarged lymph node in the axilla (2 additional lymph nodes) by ultrasound breast tumor measured 3.9 cm, biopsy: Grade 2 IDC with lymphovascular invasion ER 100%, PR 100%, HER2 positive Ki-67 40%, lymph node biopsy positive   08/18/21: Left Mastectomy: Grade 3 IDC with micropapillary features with DCIS 5.8 cm 7/7 LN Positives, involves dermal stroma and lymphatics with focal Extra nodal invasion   09/12/2021: Bone scan focal radiotracer uptake right lateral femoral condyle (knee x-ray recommended) 09/12/2021: CT CAP: Several small left axillary lymph nodes.  No evidence of distant metastatic disease.  2 cm right thyroid nodule ------------------------------------------------------------------------------------------------------------------------  Treatment Plan: 1. adjuvant Herceptin with aromatase inhibitors started 09/14/2021-07/04/2022 2.  Adjuvant radiation completed 11/09/2021   Adverse effects: Denies any adverse effects to either Herceptin or letrozole. Right knee swelling: The knee was drained and she is feeling much better.     Return to clinic in 1 year for follow-up

## 2022-07-05 ENCOUNTER — Ambulatory Visit: Payer: Medicare PPO

## 2022-07-05 ENCOUNTER — Telehealth: Payer: Self-pay | Admitting: Hematology and Oncology

## 2022-07-05 DIAGNOSIS — R293 Abnormal posture: Secondary | ICD-10-CM | POA: Diagnosis not present

## 2022-07-05 DIAGNOSIS — Z170 Estrogen receptor positive status [ER+]: Secondary | ICD-10-CM

## 2022-07-05 DIAGNOSIS — M25612 Stiffness of left shoulder, not elsewhere classified: Secondary | ICD-10-CM

## 2022-07-05 DIAGNOSIS — I890 Lymphedema, not elsewhere classified: Secondary | ICD-10-CM

## 2022-07-05 DIAGNOSIS — Z483 Aftercare following surgery for neoplasm: Secondary | ICD-10-CM

## 2022-07-05 DIAGNOSIS — C50312 Malignant neoplasm of lower-inner quadrant of left female breast: Secondary | ICD-10-CM | POA: Diagnosis not present

## 2022-07-05 NOTE — Telephone Encounter (Signed)
Scheduled appointment per 5/14 los. Left voicemail.

## 2022-07-05 NOTE — Therapy (Signed)
OUTPATIENT PHYSICAL THERAPY  UPPER EXTREMITY ONCOLOGY TREATMENT  Patient Name: Jamie [name] MRN: [id number] DOB:Feb 08, 1935, 88 y.o., female Today's Date: 07/05/2022  END OF SESSION:  PT End of Session - 07/05/22 0910     Visit Number 8    Number of Visits 12    Date for PT Re-Evaluation 07/21/22    PT Start Time 0908    PT Stop Time 1002    PT Time Calculation (min) 54 min    Activity Tolerance Patient tolerated treatment well    Behavior During Therapy [facility] for tasks assessed/performed               Past Medical History:  Diagnosis Date   Abnormality of gait 04/18/2017   Degenerative joint disease of knee, right 03/19/2018   Injected April 18, 2018 Approved for Durolane R knee 3.4.20. Patient given another steroid injection Jun 24, 2018.  Repeat injection September 09, 2018 repeat injection February 27, 2019 repeat 6/110/20221 bilateral injections given December 01, 2019 Right knee injected April 20, 2020 monovisc May 19, 2018 Repeat steroid injection given June 16, 2020 repeat injection August 24, 2020   Essential hypertension 08/14/2016   Generalized anxiety disorder 06/27/2017   History of dizziness 06/27/2017   History of hysterectomy for benign disease 06/27/2017   History of vitamin D deficiency 08/22/2017   Leukocytes in urine 08/14/2016   Lumbar facet arthropathy 06/27/2017   Major neurocognitive disorder due to multiple etiologies 09/29/2020   Mixed hyperlipidemia 08/22/2017   Osteoarthritis of knee 09/18/2017   Pancolitis 08/14/2016   Parkinson's disease 03/13/2017   Prediabetes 08/22/2017   Right knee pain    Spinal stenosis of lumbar region    Weakness of both hips 04/18/2017   White coat syndrome without diagnosis of hypertension 08/22/2017   Past Surgical History:  Procedure Laterality Date   HYSTERECTOMY ABDOMINAL WITH SALPINGECTOMY     MASTECTOMY W/ SENTINEL NODE BIOPSY Left 08/18/2021   Procedure: LEFT MASTECTOMY WITH TARGETED LYMPH NODE DISSECTION;   Surgeon: Abigail Miyamoto, MD;  Location: [facility];  Service: General;  Laterality: Left;   TONSILLECTOMY     Patient Active Problem List   Diagnosis Date Noted   Gastroesophageal reflux disease without esophagitis 03/19/2022   Heartburn 03/19/2022   S/P left mastectomy 08/18/2021   Genetic testing 07/25/2021   Family history of breast cancer 07/14/2021   Malignant neoplasm of lower-inner quadrant of left breast in female, estrogen receptor positive (HCC) 07/11/2021   Mild episode of recurrent major depressive disorder (HCC) 04/06/2021   Age-related osteoporosis without current pathological fracture 04/06/2021   Greater trochanteric bursitis of right hip 01/21/2021   Major neurocognitive disorder due to multiple etiologies 09/29/2020   Right knee pain 05/07/2018   Osteoarthritis of knees, bilateral 03/19/2018   Osteoarthritis of knee 09/18/2017   Mixed hyperlipidemia 08/22/2017   Prediabetes 08/22/2017   White coat syndrome without diagnosis of hypertension 08/22/2017   History of vitamin D deficiency 08/22/2017   Lumbar facet arthropathy 06/27/2017   History of hysterectomy for benign disease 06/27/2017   Generalized anxiety disorder 06/27/2017   History of dizziness 06/27/2017   Other fatigue 06/27/2017   Overweight (BMI 25.0-29.9) 06/27/2017   Weakness of both hips 04/18/2017   Abnormality of gait 04/18/2017   Spinal stenosis of lumbar region 04/18/2017   Parkinson's disease 03/13/2017   Pancolitis 08/14/2016   Essential hypertension 08/14/2016   Leukocytes in urine 08/14/2016       REFERRING PROVIDER: Serena Croissant MD  REFERRING DIAG: Left UE lymphedema  THERAPY DIAG:  Lymphedema, not elsewhere classified  Aftercare following surgery for neoplasm  Stiffness of left shoulder, not elsewhere classified  Malignant neoplasm of lower-inner quadrant of left breast in female, estrogen receptor positive (HCC)  Abnormal posture  ONSET DATE:  06/12/2022  Rationale for Evaluation and Treatment: Rehabilitation  SUBJECTIVE:                                                                                                                                                                                           SUBJECTIVE STATEMENT:  The tenderness is still there but it is improving. The new exercises I got lasT time are going well.   PERTINENT HISTORY:  Patient was diagnosed on 06/27/2021 with left grade 2 invasive ductal carcinoma breast cancer. She underwent a left mastectomy and targeted axillary lymph node dissection (7/7 nodes positive) on 08/18/2021. It is triple positive with a Ki67 of 40%. She had radiation. Patient has cognitive deficits effecting memory and was diagnosed with Parkinson's in 2020   PAIN:  Are you having pain? No chest is just tender to touch  PRECAUTIONS: Left UE lymphedema, Parkinsons, neuro cognitive disorder, OA , spinal stenosis, GAD  WEIGHT BEARING RESTRICTIONS: No  FALLS:  Has patient fallen in last 6 months? No  LIVING ENVIRONMENT: Lives with: lives alone, but has a family member with her every night, and they cook dinner for her Lives in: House/apartment Stairs: Yes; External: 4 steps; can reach both Has following equipment at home: Single point cane, Walker - 2 wheeled, and Grab bars  OCCUPATION: NA  LEISURE: read,  HAND DOMINANCE: right   PRIOR LEVEL OF FUNCTION: Independent with basic ADLs  PATIENT GOALS: Learn how to manage swelling   OBJECTIVE:  COGNITION: Overall cognitive status: Impaired   PALPATION: Tender left chest and left lateral trunk  OBSERVATIONS / OTHER ASSESSMENTS: mild redness left prox arm, swelling visible and palpable at left lateral trunk  SENSATION: Light touch: Appears intact    POSTURE: forward head, rounded shoulders  UPPER EXTREMITY AROM/PROM:  A/PROM RIGHT   eval   Shoulder extension   Shoulder flexion 127  Shoulder abduction 124  Shoulder  internal rotation   Shoulder external rotation     (Blank rows = not tested)  A/PROM LEFT   eval Left 07/05/22  Shoulder extension    Shoulder flexion 126 pulls at ribs 130 pulls at ribs, 138 after P/ROM  Shoulder abduction 115 118, 125 after P/ROM  Shoulder internal rotation    Shoulder external rotation      (Blank rows = not tested)  CERVICAL AROM: All  within functional limits:  UPPER EXTREMITY STRENGTH:   LYMPHEDEMA ASSESSMENTS:  SURGERY TYPE/DATE: 08/18/21 Left Mastectomy with NUMBER OF LYMPH NODES REMOVED: 7/7 CHEMOTHERAPY: NO RADIATION:Yes, completed 11/09/2021 HORMONE TREATMENT: Letrozole INFECTIONS: NO Herceptin infusions will end May 14  LYMPHEDEMA ASSESSMENTS:   LANDMARK RIGHT  eval  At axilla  34.5  15 cm proximal to olecranon process 33.8  10 cm proximal to olecranon process 31.5  Olecranon process 26.5  15 cm proximal to ulnar styloid process 25.4  10 cm proximal to ulnar styloid process 22.8  Just proximal to ulnar styloid process 16  Across hand at thumb web space 18.1  At base of 2nd digit 6.0  (Blank rows = not tested)  LANDMARK LEFT  eval  At axilla  33.8  15 cm proximal to olecranon process 32.4  10 cm proximal to olecranon process 31.8  Olecranon process 25.8  15 cm proximal to ulnar styloid process 25  10 cm proximal to ulnar styloid process 22.4  Just proximal to ulnar styloid process 16.1  Across hand at thumb web space 18.1  At base of 2nd digit 6.0  (Blank rows = not tested)   L-DEX LYMPHEDEMA SCREENING: The patient was assessed using the L-Dex machine today to produce a lymphedema index baseline score. The patient will be reassessed on a regular basis (typically every 3 months) to obtain new L-Dex scores. If the score is > 6.5 points away from his/her baseline score indicating onset of subclinical lymphedema, it will be recommended to wear a compression garment for 4 weeks, 12 hours per day and then be reassessed. If the score continues  to be > 6.5 points from baseline at reassessment, we will initiate lymphedema treatment. Assessing in this manner has a 95% rate of preventing clinically significant lymphedema.  LLIS: 6  TODAY'S TREATMENT:     07/05/22: Manual Therapy MLD to Lt UE in supine as follows: Short neck, 5 diaphragmatic breaths, Lt inguinal and Rt axillary nodes, anterior intact upper quadrant sequence, Lt axillo-inguinal and anterior inter-axillary anastomosis, then focused on Lt UE working from proximal to distal then retracing all steps with granddaughter watching for review. Supine PROM into flexion, abduction, and D2 to tolerance - pt reporting no pain and with scapular depression throughout by therapist STM pectoralis and lateral trunk gentle pressure; also to medial scapular border briefly when in Rt S/L Scap Mobs: Rt S/L briefly for Lt scapular mobs into protraction and retraction   07/03/22 No questions regarding MLD by [name]  Supine PROM into flexion, abduction, ER to tolerance - pt reporting no pain STM pectoralis and lateral trunk gentle pressure Reviewed post-op stretches and gave handout with perofrmance of each x 3 Gave new bra prescription  06/27/2022 Pts grand daughter [name] performed all steps of left UE MLD Manual lymph drainage in supine as follows: short neck, right axillary nodes, left inguinal nodes, 5 Breaths by pt; anterior inter-axillary anastamoses, left axillo-inguinal anastamoses. Left upper extremitystarting proximally at outer upper arm, inner to outer the outer arm again,retracing pathways, then forearm, hand and fingers retracing all pathways and ending with LN's from   [facility] written handout  PATIENT EDUCATION:  Education details: POC, try compression bra for trunk edema, wash sleeve every 2 days at a minimum Person educated: Patient and granddaughter  [name] Education method: Explanation Education comprehension: verbalized understanding  HOME EXERCISE PROGRAM: Self MLD, post op exercises,   ASSESSMENT:  CLINICAL IMPRESSION: Continued with manual therapy today working to decrease her Lt upper quadrant tightness. Her A/ROM of her Lt shoulder improved from beginning to end of session. They haven't called [facility] yet but plan to.   OBJECTIVE IMPAIRMENTS: decreased activity tolerance, decreased cognition, decreased ROM, increased edema, impaired UE functional use, postural dysfunction, and pain.   ACTIVITY LIMITATIONS: carrying, lifting, bathing, and reach over head  PARTICIPATION LIMITATIONS: meal prep, medication management, personal finances, driving, and community activity  PERSONAL FACTORS:Left Breast Cancer s/p radiation, Parkinsons, neuro cognitive disorder, OA , spinal stenosis, GAD are also affecting patient's functional outcome.   REHAB POTENTIAL: Good  CLINICAL DECISION MAKING: Stable/uncomplicated  EVALUATION COMPLEXITY: Low  GOALS: Goals reviewed with patient? Yes  SHORT TERM GOALS: Target date: 07/21/2022  Pt/family member will be educated in MLD for left UE lymphedema Baseline: Goal status: MET  2.  Pt will have left shoulder ROM equal to left for flexion and abduction Baseline:  Goal status: INITIAL  3.  Pt will have decreased tenderness left chest and trunk area Baseline:  Goal status: INITIAL  4.  Pt will get benefit from compression bra for lateral trunk swelling Baseline:  Goal status: INITIAL     PLAN:  PT FREQUENCY: 2x/week  PT DURATION: 4 weeks  PLANNED INTERVENTIONS: Therapeutic exercises, Patient/Family education, Self Care, Orthotic/Fit training, Manual lymph drainage, Compression bandaging, scar mobilization, Manual therapy, and Re-evaluation  PLAN FOR NEXT SESSION: Did she get  compression bra? (Doesn't have one yet- tried to find it) AAROM supine, instruct family member in MLD  to left UE and left lateral trunk, STM to left pectorals/UT prn  Hermenia Bers, PTA 07/05/2022, 10:36 AM

## 2022-07-06 ENCOUNTER — Other Ambulatory Visit: Payer: Self-pay

## 2022-07-11 ENCOUNTER — Ambulatory Visit: Payer: Medicare PPO

## 2022-07-11 NOTE — Progress Notes (Unsigned)
Assessment/Plan:   1.  Parkinsons Disease/SWEDD  -continue carbidopa/levodopa 25/100, 2 tablets 3 times daily.    -Continue carbidopa/levodopa 50/200 CR at bedtime  2.  Insomnia  -Has received benefit with melatonin, 2.5 mg nightly and will continue that  3.  Depression  -Continue Lexapro, 20 mg daily.  She is doing well with this.    -On mirtazapine, 15 mg at bedtime by primary care for mood and appetite  4.  Dementia, mixed type, PDD and AD  -This was identified on neurocognitive testing in August, 2022.  5.  Breast cancer  -Status post left mastectomy, currently undergoing chemotherapy.  Has completed Herceptin 6.  Urinary incontinence at night  -saw Dr. Ashley Royalty one time and doing well.  Subjective:   Jamie [name] was seen today in follow up for Parkinsons disease.  My previous records were reviewed prior to todays visit as well as outside records available to me.  Patient with daughter in law who supplements the history.  Patient was seen about a month ago on video, at which point we added bedtime CR levodopa for nighttime tremor.  She reports that ***.  Current prescribed movement disorder medications: Carbidopa/levodopa 25/100, 2 tablets 3 times per day  Carbidopa/levodopa 50/200 CR at bedtime (added last visit) Melatonin, 3 mg daily Lexapro 20 mg Mirtazapine, 15 mg nightly     ALLERGIES:   Allergies  Allergen Reactions   Flagyl [Metronidazole] Rash    Possible cause of rash     CURRENT MEDICATIONS:  Outpatient Encounter Medications as of 07/13/2022  Medication Sig   Biotin 1 MG CAPS    carbidopa-levodopa (SINEMET CR) 50-200 MG tablet Take 1 tablet by mouth at bedtime.   carbidopa-levodopa (SINEMET IR) 25-100 MG tablet Take 2 tablets by mouth 3 (three) times daily. (Patient taking differently: Take 2 tablets by mouth in the morning, at noon, in the evening, and at bedtime. 4 times / day)   cholecalciferol (VITAMIN D3) 25 MCG (1000 UNIT) tablet Take 2,000  Units by mouth daily.   cyanocobalamin (VITAMIN B12) 500 MCG tablet    donepezil (ARICEPT) 5 MG tablet Take 1 tablet (5 mg total) by mouth at bedtime.   letrozole (FEMARA) 2.5 MG tablet Take 1 tablet (2.5 mg total) by mouth daily.   mirtazapine (REMERON) 15 MG tablet Take 1 tablet (15 mg total) by mouth at bedtime.   Multiple Vitamins-Minerals (PRESERVISION AREDS 2 PO) Take 1 tablet by mouth in the morning and at bedtime.    NON FORMULARY Herblax as needed   NON FORMULARY Cholesterol Reduction   omeprazole (PRILOSEC) 20 MG capsule TAKE 1 CAPSULE BY MOUTH DAILY AT 5:30 PM   rosuvastatin (CRESTOR) 5 MG tablet Take 1 tablet (5 mg total) by mouth at bedtime.   Vitamin E 100 units TABS Take 250 mg by mouth. Once a day   No facility-administered encounter medications on file as of 07/13/2022.    Objective:   PHYSICAL EXAMINATION:    VITALS:   There were no vitals filed for this visit.   Wt Readings from Last 3 Encounters:  07/04/22 178 lb 4.8 oz (80.9 kg)  06/13/22 177 lb (80.3 kg)  06/12/22 176 lb 8 oz (80.1 kg)    GEN:  The patient appears stated age and is in NAD. HEENT:  Normocephalic, atraumatic.  The mucous membranes are moist.   Neurological examination:  Orientation: The patient is alert and oriented x3. Cranial nerves: There is good facial symmetry with significant facial  hypomimia. The speech is fluent and clear. Soft palate rises symmetrically and there is no tongue deviation. Hearing is intact to conversational tone. Sensation: Sensation is intact to light touch throughout Motor: Strength is at least antigravity x4.  Movement examination: Tone: There is paratonia bilaterally in the UE Abnormal movements: None Coordination:  There is decremation on the L Gait and Station: The patient pushes off to arise.  She ambulates well in the hall today with decreased arm swing today bilaterally  I have reviewed and interpreted the following labs independently    Chemistry       Component Value Date/Time   NA 141 08/03/2021 0855   K 4.4 08/03/2021 0855   CL 106 08/03/2021 0855   CO2 22 08/03/2021 0855   BUN 22 08/03/2021 0855   CREATININE 0.70 09/12/2021 1421   CREATININE 0.86 07/13/2021 0831   GLU 98 05/30/2017 0000      Component Value Date/Time   CALCIUM 10.1 08/03/2021 0855   ALKPHOS 83 08/03/2021 0855   AST 21 08/03/2021 0855   AST 18 07/13/2021 0831   ALT 8 08/03/2021 0855   ALT 18 07/13/2021 0831   BILITOT 0.5 08/03/2021 0855   BILITOT 0.6 07/13/2021 0831       Lab Results  Component Value Date   WBC 6.9 08/03/2021   HGB 13.4 08/03/2021   HCT 39.9 08/03/2021   MCV 92 08/03/2021   PLT 202 08/03/2021    Lab Results  Component Value Date   TSH 4.950 (H) 11/22/2021   Total time spent on today's visit was *** minutes, including both face-to-face time and nonface-to-face time.  Time included that spent on review of records (prior notes available to me/labs/imaging if pertinent), discussing treatment and goals, answering patient's questions and coordinating care.   Cc:  Mayer Masker, PA-C (Inactive)

## 2022-07-13 ENCOUNTER — Encounter: Payer: Self-pay | Admitting: Neurology

## 2022-07-13 ENCOUNTER — Ambulatory Visit: Payer: Medicare PPO | Admitting: Neurology

## 2022-07-13 ENCOUNTER — Other Ambulatory Visit: Payer: Self-pay

## 2022-07-13 VITALS — BP 132/70 | HR 71 | Ht 68.0 in | Wt 179.6 lb

## 2022-07-13 DIAGNOSIS — G20A1 Parkinson's disease without dyskinesia, without mention of fluctuations: Secondary | ICD-10-CM

## 2022-07-13 DIAGNOSIS — F02A0 Dementia in other diseases classified elsewhere, mild, without behavioral disturbance, psychotic disturbance, mood disturbance, and anxiety: Secondary | ICD-10-CM

## 2022-07-13 MED ORDER — DONEPEZIL HCL 10 MG PO TABS: 10.0000 mg | ORAL_TABLET | Freq: Every day | ORAL | 3 refills | Status: DC

## 2022-07-14 ENCOUNTER — Ambulatory Visit: Payer: Medicare PPO

## 2022-07-14 DIAGNOSIS — I890 Lymphedema, not elsewhere classified: Secondary | ICD-10-CM

## 2022-07-14 DIAGNOSIS — R293 Abnormal posture: Secondary | ICD-10-CM

## 2022-07-14 DIAGNOSIS — M25612 Stiffness of left shoulder, not elsewhere classified: Secondary | ICD-10-CM | POA: Diagnosis not present

## 2022-07-14 DIAGNOSIS — Z483 Aftercare following surgery for neoplasm: Secondary | ICD-10-CM | POA: Diagnosis not present

## 2022-07-14 DIAGNOSIS — Z170 Estrogen receptor positive status [ER+]: Secondary | ICD-10-CM | POA: Diagnosis not present

## 2022-07-14 DIAGNOSIS — C50312 Malignant neoplasm of lower-inner quadrant of left female breast: Secondary | ICD-10-CM | POA: Diagnosis not present

## 2022-07-14 NOTE — Therapy (Signed)
OUTPATIENT PHYSICAL THERAPY  UPPER EXTREMITY ONCOLOGY TREATMENT  Patient Name: Jamie [name] MRN: [id number] DOB:September 20, 1934, 88 y.o., female Today's Date: 07/14/2022  END OF SESSION:  PT End of Session - 07/14/22 1005     Visit Number 9    Number of Visits 12    Date for PT Re-Evaluation 07/21/22    PT Start Time 1005    PT Stop Time 1056    PT Time Calculation (min) 51 min    Activity Tolerance Patient tolerated treatment well    Behavior During Therapy [facility] for tasks assessed/performed               Past Medical History:  Diagnosis Date   Abnormality of gait 04/18/2017   Degenerative joint disease of knee, right 03/19/2018   Injected April 18, 2018 Approved for Durolane R knee 3.4.20. Patient given another steroid injection Jun 24, 2018.  Repeat injection September 09, 2018 repeat injection February 27, 2019 repeat 6/110/20221 bilateral injections given December 01, 2019 Right knee injected April 20, 2020 monovisc May 19, 2018 Repeat steroid injection given June 16, 2020 repeat injection August 24, 2020   Essential hypertension 08/14/2016   Generalized anxiety disorder 06/27/2017   History of dizziness 06/27/2017   History of hysterectomy for benign disease 06/27/2017   History of vitamin D deficiency 08/22/2017   Leukocytes in urine 08/14/2016   Lumbar facet arthropathy 06/27/2017   Major neurocognitive disorder due to multiple etiologies 09/29/2020   Mixed hyperlipidemia 08/22/2017   Osteoarthritis of knee 09/18/2017   Pancolitis 08/14/2016   Parkinson's disease 03/13/2017   Prediabetes 08/22/2017   Right knee pain    Spinal stenosis of lumbar region    Weakness of both hips 04/18/2017   White coat syndrome without diagnosis of hypertension 08/22/2017   Past Surgical History:  Procedure Laterality Date   HYSTERECTOMY ABDOMINAL WITH SALPINGECTOMY     MASTECTOMY W/ SENTINEL NODE BIOPSY Left 08/18/2021   Procedure: LEFT MASTECTOMY WITH TARGETED LYMPH NODE DISSECTION;   Surgeon: Abigail Miyamoto, MD;  Location: [facility];  Service: General;  Laterality: Left;   TONSILLECTOMY     Patient Active Problem List   Diagnosis Date Noted   Gastroesophageal reflux disease without esophagitis 03/19/2022   Heartburn 03/19/2022   S/P left mastectomy 08/18/2021   Genetic testing 07/25/2021   Family history of breast cancer 07/14/2021   Malignant neoplasm of lower-inner quadrant of left breast in female, estrogen receptor positive (HCC) 07/11/2021   Mild episode of recurrent major depressive disorder (HCC) 04/06/2021   Age-related osteoporosis without current pathological fracture 04/06/2021   Greater trochanteric bursitis of right hip 01/21/2021   Major neurocognitive disorder due to multiple etiologies 09/29/2020   Right knee pain 05/07/2018   Osteoarthritis of knees, bilateral 03/19/2018   Osteoarthritis of knee 09/18/2017   Mixed hyperlipidemia 08/22/2017   Prediabetes 08/22/2017   White coat syndrome without diagnosis of hypertension 08/22/2017   History of vitamin D deficiency 08/22/2017   Lumbar facet arthropathy 06/27/2017   History of hysterectomy for benign disease 06/27/2017   Generalized anxiety disorder 06/27/2017   History of dizziness 06/27/2017   Other fatigue 06/27/2017   Overweight (BMI 25.0-29.9) 06/27/2017   Weakness of both hips 04/18/2017   Abnormality of gait 04/18/2017   Spinal stenosis of lumbar region 04/18/2017   Parkinson's disease 03/13/2017   Pancolitis 08/14/2016   Essential hypertension 08/14/2016   Leukocytes in urine 08/14/2016       REFERRING PROVIDER: Serena Croissant MD  REFERRING DIAG: Left UE lymphedema  THERAPY DIAG:  Lymphedema, not elsewhere classified  Aftercare following surgery for neoplasm  Stiffness of left shoulder, not elsewhere classified  Malignant neoplasm of lower-inner quadrant of left breast in female, estrogen receptor positive (HCC)  Abnormal posture  ONSET DATE:  06/12/2022  Rationale for Evaluation and Treatment: Rehabilitation  SUBJECTIVE:                                                                                                                                                                                           SUBJECTIVE STATEMENT:  The tenderness is still there but it is improving. The new exercises are good. We stretch it out and massage it. Bra fitting is next Friday.  PERTINENT HISTORY:  Patient was diagnosed on 06/27/2021 with left grade 2 invasive ductal carcinoma breast cancer. She underwent a left mastectomy and targeted axillary lymph node dissection (7/7 nodes positive) on 08/18/2021. It is triple positive with a Ki67 of 40%. She had radiation. Patient has cognitive deficits effecting memory and was diagnosed with Parkinson's in 2020   PAIN:  Are you having pain? No chest is just tender to touch  PRECAUTIONS: Left UE lymphedema, Parkinsons, neuro cognitive disorder, OA , spinal stenosis, GAD  WEIGHT BEARING RESTRICTIONS: No  FALLS:  Has patient fallen in last 6 months? No  LIVING ENVIRONMENT: Lives with: lives alone, but has a family member with her every night, and they cook dinner for her Lives in: House/apartment Stairs: Yes; External: 4 steps; can reach both Has following equipment at home: Single point cane, Walker - 2 wheeled, and Grab bars  OCCUPATION: NA  LEISURE: read,  HAND DOMINANCE: right   PRIOR LEVEL OF FUNCTION: Independent with basic ADLs  PATIENT GOALS: Learn how to manage swelling   OBJECTIVE:  COGNITION: Overall cognitive status: Impaired   PALPATION: Tender left chest and left lateral trunk  OBSERVATIONS / OTHER ASSESSMENTS: mild redness left prox arm, swelling visible and palpable at left lateral trunk  SENSATION: Light touch: Appears intact    POSTURE: forward head, rounded shoulders  UPPER EXTREMITY AROM/PROM:  A/PROM RIGHT   eval   Shoulder extension   Shoulder flexion  127  Shoulder abduction 124  Shoulder internal rotation   Shoulder external rotation     (Blank rows = not tested)  A/PROM LEFT   eval Left 07/05/22  Shoulder extension    Shoulder flexion 126 pulls at ribs 130 pulls at ribs, 138 after P/ROM  Shoulder abduction 115 118, 125 after P/ROM  Shoulder internal rotation    Shoulder external rotation      (Blank rows =  not tested)  CERVICAL AROM: All within functional limits:  UPPER EXTREMITY STRENGTH:   LYMPHEDEMA ASSESSMENTS:  SURGERY TYPE/DATE: 08/18/21 Left Mastectomy with NUMBER OF LYMPH NODES REMOVED: 7/7 CHEMOTHERAPY: NO RADIATION:Yes, completed 11/09/2021 HORMONE TREATMENT: Letrozole INFECTIONS: NO Herceptin infusions will end May 14  LYMPHEDEMA ASSESSMENTS:   LANDMARK RIGHT  eval  At axilla  34.5  15 cm proximal to olecranon process 33.8  10 cm proximal to olecranon process 31.5  Olecranon process 26.5  15 cm proximal to ulnar styloid process 25.4  10 cm proximal to ulnar styloid process 22.8  Just proximal to ulnar styloid process 16  Across hand at thumb web space 18.1  At base of 2nd digit 6.0  (Blank rows = not tested)  LANDMARK LEFT  eval  At axilla  33.8  15 cm proximal to olecranon process 32.4  10 cm proximal to olecranon process 31.8  Olecranon process 25.8  15 cm proximal to ulnar styloid process 25  10 cm proximal to ulnar styloid process 22.4  Just proximal to ulnar styloid process 16.1  Across hand at thumb web space 18.1  At base of 2nd digit 6.0  (Blank rows = not tested)   L-DEX LYMPHEDEMA SCREENING: The patient was assessed using the L-Dex machine today to produce a lymphedema index baseline score. The patient will be reassessed on a regular basis (typically every 3 months) to obtain new L-Dex scores. If the score is > 6.5 points away from his/her baseline score indicating onset of subclinical lymphedema, it will be recommended to wear a compression garment for 4 weeks, 12 hours per day and  then be reassessed. If the score continues to be > 6.5 points from baseline at reassessment, we will initiate lymphedema treatment. Assessing in this manner has a 95% rate of preventing clinically significant lymphedema.  LLIS: 6  TODAY'S TREATMENT:   07/14/2022 Pts son Brett Canales and Granddaughter [name] in attendance  STM to left UT, pectorals and lateral trunk with cocoa butter  PROM left shoulder flexion, scaption, abd, ER MFR left chest region MLD to Lt UE in supine as follows: Short neck, 5 diaphragmatic breaths, Lt inguinal and Rt axillary nodes,  Lt axillo-inguinal and anterior inter-axillary anastomosis, then focused on Lt UE working from proximal to distal then retracing all steps with granddaughter watching for review. Son educated about lymphatics, gentleness/pathways etc. He is going to talk with his mom to see if she would be comfortable with him helping her. They are also looking for a caregiving company for several hours a day. 07/05/22: Manual Therapy MLD to Lt UE in supine as follows: Short neck, 5 diaphragmatic breaths, Lt inguinal and Rt axillary nodes, anterior intact upper quadrant sequence, Lt axillo-inguinal and anterior inter-axillary anastomosis, then focused on Lt UE working from proximal to distal then retracing all steps with granddaughter watching for review. Supine PROM into flexion, abduction, and D2 to tolerance - pt reporting no pain and with scapular depression throughout by therapist STM pectoralis and lateral trunk gentle pressure; also to medial scapular border briefly when in Rt S/L Scap Mobs: Rt S/L briefly for Lt scapular mobs into protraction and retraction   07/03/22 No questions regarding MLD by [name]  Supine PROM into flexion, abduction, ER to tolerance - pt reporting no pain STM pectoralis and lateral trunk gentle pressure Reviewed post-op stretches and gave handout with perofrmance of each x 3 Gave new bra prescription  06/27/2022 Pts grand daughter [name]  performed all steps of left UE MLD Manual  lymph drainage in supine as follows: short neck, right axillary nodes, left inguinal nodes, 5 Breaths by pt; anterior inter-axillary anastamoses, left axillo-inguinal anastamoses. Left upper extremitystarting proximally at outer upper arm, inner to outer the outer arm again,retracing pathways, then forearm, hand and fingers retracing all pathways and ending with LN's from   Gave written handout                                                                                                                                         PATIENT EDUCATION:  Education details: POC, try compression bra for trunk edema, wash sleeve every 2 days at a minimum Person educated: Patient and granddaughter [name] Education method: Explanation Education comprehension: verbalized understanding  HOME EXERCISE PROGRAM: Self MLD, post op exercises,   ASSESSMENT:  CLINICAL IMPRESSION: Continued with manual therapy today working to decrease her Lt upper quadrant tightness and MLD.  Tenderness still present, but improved. Bra appt next Friday   OBJECTIVE IMPAIRMENTS: decreased activity tolerance, decreased cognition, decreased ROM, increased edema, impaired UE functional use, postural dysfunction, and pain.   ACTIVITY LIMITATIONS: carrying, lifting, bathing, and reach over head  PARTICIPATION LIMITATIONS: meal prep, medication management, personal finances, driving, and community activity  PERSONAL FACTORS:Left Breast Cancer s/p radiation, Parkinsons, neuro cognitive disorder, OA , spinal stenosis, GAD are also affecting patient's functional outcome.   REHAB POTENTIAL: Good  CLINICAL DECISION MAKING: Stable/uncomplicated  EVALUATION COMPLEXITY: Low  GOALS: Goals reviewed with patient? Yes  SHORT TERM GOALS: Target date: 07/21/2022  Pt/family member will be educated in MLD for left UE lymphedema Baseline: Goal status: MET  2.  Pt will have left shoulder ROM equal  to right for flexion and abduction Baseline:  Goal status: INITIAL  3.  Pt will have decreased tenderness left chest and trunk area Baseline:  Goal status: INITIAL  4.  Pt will get benefit from compression bra for lateral trunk swelling Baseline:  Goal status: INITIAL     PLAN:  PT FREQUENCY: 2x/week  PT DURATION: 4 weeks  PLANNED INTERVENTIONS: Therapeutic exercises, Patient/Family education, Self Care, Orthotic/Fit training, Manual lymph drainage, Compression bandaging, scar mobilization, Manual therapy, and Re-evaluation  PLAN FOR NEXT SESSION: appt for compression bra May 31  AAROM supine, instruct family member in MLD to left UE and left lateral trunk, STM to left pectorals/UT prn, PROM  Waynette Buttery, PT 07/14/2022, 12:13 PM

## 2022-07-18 ENCOUNTER — Ambulatory Visit: Payer: Medicare PPO

## 2022-07-18 ENCOUNTER — Telehealth: Payer: Self-pay

## 2022-07-18 VITALS — Wt 177.4 lb

## 2022-07-18 DIAGNOSIS — Z170 Estrogen receptor positive status [ER+]: Secondary | ICD-10-CM

## 2022-07-18 DIAGNOSIS — M25612 Stiffness of left shoulder, not elsewhere classified: Secondary | ICD-10-CM

## 2022-07-18 DIAGNOSIS — R293 Abnormal posture: Secondary | ICD-10-CM

## 2022-07-18 DIAGNOSIS — Z483 Aftercare following surgery for neoplasm: Secondary | ICD-10-CM

## 2022-07-18 DIAGNOSIS — C50312 Malignant neoplasm of lower-inner quadrant of left female breast: Secondary | ICD-10-CM | POA: Diagnosis not present

## 2022-07-18 DIAGNOSIS — I890 Lymphedema, not elsewhere classified: Secondary | ICD-10-CM | POA: Diagnosis not present

## 2022-07-18 NOTE — Telephone Encounter (Signed)
Called patients daughter in law Alona Bene and let her know what [facility] had to say and that I have a call into the office to see if I can help the patient get at home health

## 2022-07-18 NOTE — Therapy (Signed)
OUTPATIENT PHYSICAL THERAPY  UPPER EXTREMITY ONCOLOGY TREATMENT  Patient Name: Jamie [name] MRN: [id number] DOB:[birth year], 88 y.o., female Today's Date: 07/18/2022  END OF SESSION:  PT End of Session - 07/18/22 0907     Visit Number 10    Number of Visits 12    Date for PT Re-Evaluation 07/21/22    PT Start Time 0905    PT Stop Time 1004    PT Time Calculation (min) 59 min    Activity Tolerance Patient tolerated treatment well    Behavior During Therapy [facility] for tasks assessed/performed               Past Medical History:  Diagnosis Date   Abnormality of gait 04/18/2017   Degenerative joint disease of knee, right 03/19/2018   Injected April 18, 2018 Approved for Durolane R knee 3.4.20. Patient given another steroid injection Jun 24, 2018.  Repeat injection September 09, 2018 repeat injection February 27, 2019 repeat 6/110/20221 bilateral injections given December 01, 2019 Right knee injected April 20, 2020 monovisc May 19, 2018 Repeat steroid injection given June 16, 2020 repeat injection August 24, 2020   Essential hypertension 08/14/2016   Generalized anxiety disorder 06/27/2017   History of dizziness 06/27/2017   History of hysterectomy for benign disease 06/27/2017   History of vitamin D deficiency 08/22/2017   Leukocytes in urine 08/14/2016   Lumbar facet arthropathy 06/27/2017   Major neurocognitive disorder due to multiple etiologies 09/29/2020   Mixed hyperlipidemia 08/22/2017   Osteoarthritis of knee 09/18/2017   Pancolitis 08/14/2016   Parkinson's disease 03/13/2017   Prediabetes 08/22/2017   Right knee pain    Spinal stenosis of lumbar region    Weakness of both hips 04/18/2017   White coat syndrome without diagnosis of hypertension 08/22/2017   Past Surgical History:  Procedure Laterality Date   HYSTERECTOMY ABDOMINAL WITH SALPINGECTOMY     MASTECTOMY W/ SENTINEL NODE BIOPSY Left 08/18/2021   Procedure: LEFT MASTECTOMY WITH TARGETED LYMPH NODE DISSECTION;   Surgeon: Abigail Miyamoto, MD;  Location: [facility];  Service: General;  Laterality: Left;   TONSILLECTOMY     Patient Active Problem List   Diagnosis Date Noted   Gastroesophageal reflux disease without esophagitis 03/19/2022   Heartburn 03/19/2022   S/P left mastectomy 08/18/2021   Genetic testing 07/25/2021   Family history of breast cancer 07/14/2021   Malignant neoplasm of lower-inner quadrant of left breast in female, estrogen receptor positive (HCC) 07/11/2021   Mild episode of recurrent major depressive disorder (HCC) 04/06/2021   Age-related osteoporosis without current pathological fracture 04/06/2021   Greater trochanteric bursitis of right hip 01/21/2021   Major neurocognitive disorder due to multiple etiologies 09/29/2020   Right knee pain 05/07/2018   Osteoarthritis of knees, bilateral 03/19/2018   Osteoarthritis of knee 09/18/2017   Mixed hyperlipidemia 08/22/2017   Prediabetes 08/22/2017   White coat syndrome without diagnosis of hypertension 08/22/2017   History of vitamin D deficiency 08/22/2017   Lumbar facet arthropathy 06/27/2017   History of hysterectomy for benign disease 06/27/2017   Generalized anxiety disorder 06/27/2017   History of dizziness 06/27/2017   Other fatigue 06/27/2017   Overweight (BMI 25.0-29.9) 06/27/2017   Weakness of both hips 04/18/2017   Abnormality of gait 04/18/2017   Spinal stenosis of lumbar region 04/18/2017   Parkinson's disease 03/13/2017   Pancolitis 08/14/2016   Essential hypertension 08/14/2016   Leukocytes in urine 08/14/2016       REFERRING PROVIDER: Serena Croissant MD  REFERRING DIAG: Left UE lymphedema  THERAPY DIAG:  Lymphedema, not elsewhere classified  Aftercare following surgery for neoplasm  Stiffness of left shoulder, not elsewhere classified  Malignant neoplasm of lower-inner quadrant of left breast in female, estrogen receptor positive (HCC)  Abnormal posture  ONSET DATE:  06/12/2022  Rationale for Evaluation and Treatment: Rehabilitation  SUBJECTIVE:                                                                                                                                                                                           SUBJECTIVE STATEMENT:  I've bene wearing my sleeve and my granddaughter has been doing my self MLD.   PERTINENT HISTORY:  Patient was diagnosed on 06/27/2021 with left grade 2 invasive ductal carcinoma breast cancer. She underwent a left mastectomy and targeted axillary lymph node dissection (7/7 nodes positive) on 08/18/2021. It is triple positive with a Ki67 of 40%. She had radiation. Patient has cognitive deficits effecting memory and was diagnosed with Parkinson's in 2020   PAIN:  Are you having pain? No chest is just tender to touch  PRECAUTIONS: Left UE lymphedema, Parkinsons, neuro cognitive disorder, OA , spinal stenosis, GAD  WEIGHT BEARING RESTRICTIONS: No  FALLS:  Has patient fallen in last 6 months? No  LIVING ENVIRONMENT: Lives with: lives alone, but has a family member with her every night, and they cook dinner for her Lives in: House/apartment Stairs: Yes; External: 4 steps; can reach both Has following equipment at home: Single point cane, Walker - 2 wheeled, and Grab bars  OCCUPATION: NA  LEISURE: read,  HAND DOMINANCE: right   PRIOR LEVEL OF FUNCTION: Independent with basic ADLs  PATIENT GOALS: Learn how to manage swelling   OBJECTIVE:  COGNITION: Overall cognitive status: Impaired   PALPATION: Tender left chest and left lateral trunk  OBSERVATIONS / OTHER ASSESSMENTS: mild redness left prox arm, swelling visible and palpable at left lateral trunk  SENSATION: Light touch: Appears intact    POSTURE: forward head, rounded shoulders  UPPER EXTREMITY AROM/PROM:  A/PROM RIGHT   eval   Shoulder extension   Shoulder flexion 127  Shoulder abduction 124  Shoulder internal rotation    Shoulder external rotation     (Blank rows = not tested)  A/PROM LEFT   eval Left 07/05/22  Shoulder extension    Shoulder flexion 126 pulls at ribs 130 pulls at ribs, 138 after P/ROM  Shoulder abduction 115 118, 125 after P/ROM  Shoulder internal rotation    Shoulder external rotation      (Blank rows = not tested)  CERVICAL AROM: All within functional limits:  UPPER  EXTREMITY STRENGTH:   LYMPHEDEMA ASSESSMENTS:  SURGERY TYPE/DATE: 08/18/21 Left Mastectomy with NUMBER OF LYMPH NODES REMOVED: 7/7 CHEMOTHERAPY: NO RADIATION:Yes, completed 11/09/2021 HORMONE TREATMENT: Letrozole INFECTIONS: NO Herceptin infusions will end May 14  LYMPHEDEMA ASSESSMENTS:   LANDMARK RIGHT  eval  At axilla  34.5  15 cm proximal to olecranon process 33.8  10 cm proximal to olecranon process 31.5  Olecranon process 26.5  15 cm proximal to ulnar styloid process 25.4  10 cm proximal to ulnar styloid process 22.8  Just proximal to ulnar styloid process 16  Across hand at thumb web space 18.1  At base of 2nd digit 6.0  (Blank rows = not tested)  LANDMARK LEFT  eval  At axilla  33.8  15 cm proximal to olecranon process 32.4  10 cm proximal to olecranon process 31.8  Olecranon process 25.8  15 cm proximal to ulnar styloid process 25  10 cm proximal to ulnar styloid process 22.4  Just proximal to ulnar styloid process 16.1  Across hand at thumb web space 18.1  At base of 2nd digit 6.0  (Blank rows = not tested)   L-DEX LYMPHEDEMA SCREENING: The patient was assessed using the L-Dex machine today to produce a lymphedema index baseline score. The patient will be reassessed on a regular basis (typically every 3 months) to obtain new L-Dex scores. If the score is > 6.5 points away from his/her baseline score indicating onset of subclinical lymphedema, it will be recommended to wear a compression garment for 4 weeks, 12 hours per day and then be reassessed. If the score continues to be > 6.5 points  from baseline at reassessment, we will initiate lymphedema treatment. Assessing in this manner has a 95% rate of preventing clinically significant lymphedema.  LLIS: 6  TODAY'S TREATMENT:   07/18/22: Pts son Brett Canales and Granddaughter [name] in attendance Redid SOZO and pt is still in the red and elevated from last SOZO done 1 month ago so will incorporate compression bandaging today. Self Care Spent time explaining benefits of compression bandaging to pt, pts son and granddaughter and answered their questions about this. Manual Therapy Compression Bandaging to Lt UE as follows: Cocoa butter, TG soft, Elastomull to fingers 1-4, Artiflex x1 from wrist to axilla, 1-6 cm to hand, 1-8 cm from wrist and "X" at elbow, then 1-10 cm short stretch bandages from wrist to axilla beginning to instruct pts granddaughter in this. Issued handout and she recorded, with pts permission, therapist performing this with her phone for further reference at home.   07/14/2022 Pts son Brett Canales and Granddaughter [name] in attendance  STM to left UT, pectorals and lateral trunk with cocoa butter  PROM left shoulder flexion, scaption, abd, ER MFR left chest region MLD to Lt UE in supine as follows: Short neck, 5 diaphragmatic breaths, Lt inguinal and Rt axillary nodes,  Lt axillo-inguinal and anterior inter-axillary anastomosis, then focused on Lt UE working from proximal to distal then retracing all steps with granddaughter watching for review. Son educated about lymphatics, gentleness/pathways etc. He is going to talk with his mom to see if she would be comfortable with him helping her. They are also looking for a caregiving company for several hours a day.  07/05/22: Manual Therapy MLD to Lt UE in supine as follows: Short neck, 5 diaphragmatic breaths, Lt inguinal and Rt axillary nodes, anterior intact upper quadrant sequence, Lt axillo-inguinal and anterior inter-axillary anastomosis, then focused on Lt UE working from proximal to  distal then retracing all  steps with granddaughter watching for review. Supine PROM into flexion, abduction, and D2 to tolerance - pt reporting no pain and with scapular depression throughout by therapist STM pectoralis and lateral trunk gentle pressure; also to medial scapular border briefly when in Rt S/L Scap Mobs: Rt S/L briefly for Lt scapular mobs into protraction and retraction                                                                                                                                        PATIENT EDUCATION:  Education details: POC, try compression bra for trunk edema, wash sleeve every 2 days at a minimum Person educated: Patient and granddaughter [name] Education method: Explanation Education comprehension: verbalized understanding  HOME EXERCISE PROGRAM: Self MLD, post op exercises,   ASSESSMENT:  CLINICAL IMPRESSION: Pts SOZO was still in the red today so added compression bandaging today per POC. Instructed granddaughter in how to do this at home. She is to practice this and we will reassess this when they return Friday.   OBJECTIVE IMPAIRMENTS: decreased activity tolerance, decreased cognition, decreased ROM, increased edema, impaired UE functional use, postural dysfunction, and pain.   ACTIVITY LIMITATIONS: carrying, lifting, bathing, and reach over head  PARTICIPATION LIMITATIONS: meal prep, medication management, personal finances, driving, and community activity  PERSONAL FACTORS:Left Breast Cancer s/p radiation, Parkinsons, neuro cognitive disorder, OA , spinal stenosis, GAD are also affecting patient's functional outcome.   REHAB POTENTIAL: Good  CLINICAL DECISION MAKING: Stable/uncomplicated  EVALUATION COMPLEXITY: Low  GOALS: Goals reviewed with patient? Yes  SHORT TERM GOALS: Target date: 07/21/2022  Pt/family member will be educated in MLD for left UE lymphedema Baseline: Goal status: MET  2.  Pt will have left shoulder ROM equal to  right for flexion and abduction Baseline:  Goal status: INITIAL  3.  Pt will have decreased tenderness left chest and trunk area Baseline:  Goal status: INITIAL  4.  Pt will get benefit from compression bra for lateral trunk swelling Baseline:  Goal status: INITIAL     PLAN:  PT FREQUENCY: 2x/week  PT DURATION: 4 weeks  PLANNED INTERVENTIONS: Therapeutic exercises, Patient/Family education, Self Care, Orthotic/Fit training, Manual lymph drainage, Compression bandaging, scar mobilization, Manual therapy, and Re-evaluation  PLAN FOR NEXT SESSION: Probable renewal at next appt to help work towards independence with compression bandaging and consider flat knit? appt for compression bra May 31; AAROM supine, instruct family member in MLD to left UE and left lateral trunk, STM to left pectorals/UT prn, PROM  Hermenia Bers, PTA 07/18/2022, 1:33 PM

## 2022-07-19 ENCOUNTER — Telehealth: Payer: Self-pay | Admitting: Neurology

## 2022-07-19 NOTE — Telephone Encounter (Signed)
Jamie [name] called in stating she spoke with [facility] and they stated they will pay for someone to come sit with the patient to watch her. They are just not sure how to go about getting that?

## 2022-07-19 NOTE — Telephone Encounter (Signed)
Called daughter in law back and she called [employer] and they said if they could send someone to sit with patient for up to 36 hours. I have reached out to Monia Pouch to see if they would be able to help the patient with this

## 2022-07-20 ENCOUNTER — Encounter: Payer: Self-pay | Admitting: Neurology

## 2022-07-21 ENCOUNTER — Ambulatory Visit: Payer: Medicare PPO

## 2022-07-21 DIAGNOSIS — Z170 Estrogen receptor positive status [ER+]: Secondary | ICD-10-CM | POA: Diagnosis not present

## 2022-07-21 DIAGNOSIS — C50312 Malignant neoplasm of lower-inner quadrant of left female breast: Secondary | ICD-10-CM | POA: Diagnosis not present

## 2022-07-21 DIAGNOSIS — Z483 Aftercare following surgery for neoplasm: Secondary | ICD-10-CM

## 2022-07-21 DIAGNOSIS — R293 Abnormal posture: Secondary | ICD-10-CM

## 2022-07-21 DIAGNOSIS — I890 Lymphedema, not elsewhere classified: Secondary | ICD-10-CM

## 2022-07-21 DIAGNOSIS — M25612 Stiffness of left shoulder, not elsewhere classified: Secondary | ICD-10-CM | POA: Diagnosis not present

## 2022-07-21 NOTE — Therapy (Signed)
OUTPATIENT PHYSICAL THERAPY  UPPER EXTREMITY ONCOLOGY TREATMENT  Patient Name: Jamie [name] MRN: [id number] DOB:10/25/1934, 88 y.o., female Today's Date: 07/21/2022  END OF SESSION:  PT End of Session - 07/21/22 0901     Visit Number 11    Number of Visits 19    Date for PT Re-Evaluation 08/18/22    PT Start Time 0903    PT Stop Time 1000    PT Time Calculation (min) 57 min    Activity Tolerance Patient tolerated treatment well    Behavior During Therapy [facility] for tasks assessed/performed               Past Medical History:  Diagnosis Date   Abnormality of gait 04/18/2017   Degenerative joint disease of knee, right 03/19/2018   Injected April 18, 2018 Approved for Durolane R knee 3.4.20. Patient given another steroid injection Jun 24, 2018.  Repeat injection September 09, 2018 repeat injection February 27, 2019 repeat 6/110/20221 bilateral injections given December 01, 2019 Right knee injected April 20, 2020 monovisc May 19, 2018 Repeat steroid injection given June 16, 2020 repeat injection August 24, 2020   Essential hypertension 08/14/2016   Generalized anxiety disorder 06/27/2017   History of dizziness 06/27/2017   History of hysterectomy for benign disease 06/27/2017   History of vitamin D deficiency 08/22/2017   Leukocytes in urine 08/14/2016   Lumbar facet arthropathy 06/27/2017   Major neurocognitive disorder due to multiple etiologies 09/29/2020   Mixed hyperlipidemia 08/22/2017   Osteoarthritis of knee 09/18/2017   Pancolitis 08/14/2016   Parkinson's disease 03/13/2017   Prediabetes 08/22/2017   Right knee pain    Spinal stenosis of lumbar region    Weakness of both hips 04/18/2017   White coat syndrome without diagnosis of hypertension 08/22/2017   Past Surgical History:  Procedure Laterality Date   HYSTERECTOMY ABDOMINAL WITH SALPINGECTOMY     MASTECTOMY W/ SENTINEL NODE BIOPSY Left 08/18/2021   Procedure: LEFT MASTECTOMY WITH TARGETED LYMPH NODE DISSECTION;   Surgeon: Abigail Miyamoto, MD;  Location: [facility];  Service: General;  Laterality: Left;   TONSILLECTOMY     Patient Active Problem List   Diagnosis Date Noted   Gastroesophageal reflux disease without esophagitis 03/19/2022   Heartburn 03/19/2022   S/P left mastectomy 08/18/2021   Genetic testing 07/25/2021   Family history of breast cancer 07/14/2021   Malignant neoplasm of lower-inner quadrant of left breast in female, estrogen receptor positive (HCC) 07/11/2021   Mild episode of recurrent major depressive disorder (HCC) 04/06/2021   Age-related osteoporosis without current pathological fracture 04/06/2021   Greater trochanteric bursitis of right hip 01/21/2021   Major neurocognitive disorder due to multiple etiologies 09/29/2020   Right knee pain 05/07/2018   Osteoarthritis of knees, bilateral 03/19/2018   Osteoarthritis of knee 09/18/2017   Mixed hyperlipidemia 08/22/2017   Prediabetes 08/22/2017   White coat syndrome without diagnosis of hypertension 08/22/2017   History of vitamin D deficiency 08/22/2017   Lumbar facet arthropathy 06/27/2017   History of hysterectomy for benign disease 06/27/2017   Generalized anxiety disorder 06/27/2017   History of dizziness 06/27/2017   Other fatigue 06/27/2017   Overweight (BMI 25.0-29.9) 06/27/2017   Weakness of both hips 04/18/2017   Abnormality of gait 04/18/2017   Spinal stenosis of lumbar region 04/18/2017   Parkinson's disease 03/13/2017   Pancolitis 08/14/2016   Essential hypertension 08/14/2016   Leukocytes in urine 08/14/2016       REFERRING PROVIDER: Serena Croissant MD  REFERRING DIAG: Left UE lymphedema  THERAPY DIAG:  Lymphedema, not elsewhere classified  Aftercare following surgery for neoplasm  Stiffness of left shoulder, not elsewhere classified  Malignant neoplasm of lower-inner quadrant of left breast in female, estrogen receptor positive (HCC)  Abnormal posture  ONSET DATE:  06/12/2022  Rationale for Evaluation and Treatment: Rehabilitation  SUBJECTIVE:                                                                                                                                                                                           SUBJECTIVE STATEMENT:  The wrap feels good and [name] did a good job wrapping.  [name](grand daughter) notes: I had to do her hand several times last night because I got it too tight and it took awhile because I had to watch the video to be sure I was doing it correctly. Pts insurance will not cover compression bra and it would be $160.00 so at therapist suggestion will try and find a sports bra with front zip that comes high enough on the sides.  PERTINENT HISTORY:  Patient was diagnosed on 06/27/2021 with left grade 2 invasive ductal carcinoma breast cancer. She underwent a left mastectomy and targeted axillary lymph node dissection (7/7 nodes positive) on 08/18/2021. It is triple positive with a Ki67 of 40%. She had radiation. Patient has cognitive deficits effecting memory and was diagnosed with Parkinson's in 2020   PAIN:  Are you having pain? No chest is just tender to touch  PRECAUTIONS: Left UE lymphedema, Parkinsons, neuro cognitive disorder, OA , spinal stenosis, GAD  WEIGHT BEARING RESTRICTIONS: No  FALLS:  Has patient fallen in last 6 months? No  LIVING ENVIRONMENT: Lives with: lives alone, but has a family member with her every night, and they cook dinner for her Lives in: House/apartment Stairs: Yes; External: 4 steps; can reach both Has following equipment at home: Single point cane, Walker - 2 wheeled, and Grab bars  OCCUPATION: NA  LEISURE: read,  HAND DOMINANCE: right   PRIOR LEVEL OF FUNCTION: Independent with basic ADLs  PATIENT GOALS: Learn how to manage swelling   OBJECTIVE:  COGNITION: Overall cognitive status: Impaired   PALPATION: Tender left chest and left lateral trunk  OBSERVATIONS / OTHER  ASSESSMENTS: mild redness left prox arm, swelling visible and palpable at left lateral trunk  SENSATION: Light touch: Appears intact    POSTURE: forward head, rounded shoulders  UPPER EXTREMITY AROM/PROM:  A/PROM RIGHT   eval   Shoulder extension   Shoulder flexion 127  Shoulder abduction 124  Shoulder internal rotation   Shoulder external rotation     (Blank  rows = not tested)  A/PROM LEFT   eval Left 07/05/22  Shoulder extension    Shoulder flexion 126 pulls at ribs 130 pulls at ribs, 138 after P/ROM  Shoulder abduction 115 118, 125 after P/ROM  Shoulder internal rotation    Shoulder external rotation      (Blank rows = not tested)  CERVICAL AROM: All within functional limits:  UPPER EXTREMITY STRENGTH:   LYMPHEDEMA ASSESSMENTS:  SURGERY TYPE/DATE: 08/18/21 Left Mastectomy with NUMBER OF LYMPH NODES REMOVED: 7/7 CHEMOTHERAPY: NO RADIATION:Yes, completed 11/09/2021 HORMONE TREATMENT: Letrozole INFECTIONS: NO Herceptin infusions will end May 14  LYMPHEDEMA ASSESSMENTS:   LANDMARK RIGHT  eval RIGHT 07/21/2022  At axilla  34.5 33.4  15 cm proximal to olecranon process 33.8 32.9  10 cm proximal to olecranon process 31.5 31.5  Olecranon process 26.5 26.0  15 cm proximal to ulnar styloid process 25.4 25.4  10 cm proximal to ulnar styloid process 22.8 23.3  Just proximal to ulnar styloid process 16 16.05  Across hand at thumb web space 18.1 18.1  At base of 2nd digit 6.0 6.0  (Blank rows = not tested)  LANDMARK LEFT  eval LEFT  At axilla  33.8 32.8  15 cm proximal to olecranon process 32.4 31.8  10 cm proximal to olecranon process 31.8 31.1  Olecranon process 25.8 24.6  15 cm proximal to ulnar styloid process 25 24.8  10 cm proximal to ulnar styloid process 22.4 22.7  Just proximal to ulnar styloid process 16.1 16.1  Across hand at thumb web space 18.1 17.9  At base of 2nd digit 6.0 6.0  (Blank rows = not tested)   L-DEX LYMPHEDEMA SCREENING: The  patient was assessed using the L-Dex machine today to produce a lymphedema index baseline score. The patient will be reassessed on a regular basis (typically every 3 months) to obtain new L-Dex scores. If the score is > 6.5 points away from his/her baseline score indicating onset of subclinical lymphedema, it will be recommended to wear a compression garment for 4 weeks, 12 hours per day and then be reassessed. If the score continues to be > 6.5 points from baseline at reassessment, we will initiate lymphedema treatment. Assessing in this manner has a 95% rate of preventing clinically significant lymphedema.  LLIS: 6  TODAY'S TREATMENT:   07/21/2022 Pts son and grand daughter in attendance Wraps removed and Pts bilateral arm circumference measured Pts. Grand daughter performed Compression Bandaging to Lt UE as follows: Cocoa butter (applied by PT), TG soft, Elastomull to fingers 1-4 under TG soft, Artiflex x1 from wrist to axilla, 1-6 cm to hand, 1-8 cm from wrist and "X" at elbow, then 1-10 cm short stretch bandages from wrist to axilla. Pts grandaughter did all wrapping with only occasional VC's and demonstration byt therapist. Pts grandaughter showed pt how to exercise to loosen her wraps. Reminded all that wraps need to be removed if she is having numbness, pain or any discomfort.  07/18/22: Pts son Brett Canales and Granddaughter [name] in attendance Redid SOZO and pt is still in the red and elevated from last SOZO done 1 month ago so will incorporate compression bandaging today. Self Care Spent time explaining benefits of compression bandaging to pt, pts son and granddaughter and answered their questions about this. Manual Therapy Compression Bandaging to Lt UE as follows: Cocoa butter, TG soft, Elastomull to fingers 1-4, Artiflex x1 from wrist to axilla, 1-6 cm to hand, 1-8 cm from wrist and "X" at elbow, then 1-10  cm short stretch bandages from wrist to axilla beginning to instruct pts granddaughter in  this. Issued handout and she recorded, with pts permission, therapist performing this with her phone for further reference at home.   07/14/2022 Pts son Brett Canales and Granddaughter [name] in attendance  STM to left UT, pectorals and lateral trunk with cocoa butter  PROM left shoulder flexion, scaption, abd, ER MFR left chest region MLD to Lt UE in supine as follows: Short neck, 5 diaphragmatic breaths, Lt inguinal and Rt axillary nodes,  Lt axillo-inguinal and anterior inter-axillary anastomosis, then focused on Lt UE working from proximal to distal then retracing all steps with granddaughter watching for review. Son educated about lymphatics, gentleness/pathways etc. He is going to talk with his mom to see if she would be comfortable with him helping her. They are also looking for a caregiving company for several hours a day.  07/05/22: Manual Therapy MLD to Lt UE in supine as follows: Short neck, 5 diaphragmatic breaths, Lt inguinal and Rt axillary nodes, anterior intact upper quadrant sequence, Lt axillo-inguinal and anterior inter-axillary anastomosis, then focused on Lt UE working from proximal to distal then retracing all steps with granddaughter watching for review. Supine PROM into flexion, abduction, and D2 to tolerance - pt reporting no pain and with scapular depression throughout by therapist STM pectoralis and lateral trunk gentle pressure; also to medial scapular border briefly when in Rt S/L Scap Mobs: Rt S/L briefly for Lt scapular mobs into protraction and retraction                                                                                                                                        PATIENT EDUCATION:  Education details: POC, try compression bra for trunk edema, wash sleeve every 2 days at a minimum Person educated: Patient and granddaughter [name] Education method: Explanation Education comprehension: verbalized understanding  HOME EXERCISE PROGRAM: Self MLD, post op  exercises,   ASSESSMENT:  CLINICAL IMPRESSION:  Pt tolerated her wrap without any discomfort of complaints. Her grand daughter did an outstanding job wrapping her today with very few VC's required. Her wrap was well done, had good pressure and was comfortable for the pt. Pt continues to have limitations in ROM and tightness throughout her chest. Treatment plan was amended to include compression bandaging since her SOZO screen increased into the red zone. She requires continued skilled PT to address deficits and for continued family instruction for independence at home. Pt will also benefit from a flat knit sleeve at completion of bandaging that will have to be ordered   OBJECTIVE IMPAIRMENTS: decreased activity tolerance, decreased cognition, decreased ROM, increased edema, impaired UE functional use, postural dysfunction, and pain.   ACTIVITY LIMITATIONS: carrying, lifting, bathing, and reach over head  PARTICIPATION LIMITATIONS: meal prep, medication management, personal finances, driving, and community activity  PERSONAL FACTORS:Left Breast Cancer s/p radiation, Parkinsons, neuro cognitive  disorder, OA , spinal stenosis, GAD are also affecting patient's functional outcome.   REHAB POTENTIAL: Good  CLINICAL DECISION MAKING: Stable/uncomplicated  EVALUATION COMPLEXITY: Low  GOALS: Goals reviewed with patient? Yes  SHORT TERM GOALS: Target date: 07/21/2022  Pt/family member will be educated in MLD for left UE lymphedema Baseline: Goal status: MET  2.  Pt will have left shoulder ROM equal to right for flexion and abduction Baseline:  Goal status: Ongoing  3.  Pt will have decreased tenderness left chest and trunk area Baseline:  Goal status: Ongoing 4.  Pt will get benefit from compression bra for lateral trunk swelling Baseline:  Goal status: Ongoing 5. Pt will be tolerant of compression bandaging  Gaol Status: NEW  6. Pt will be fit for appropriate Compression garment for  Left UE lymphedema.  GOAL STATUS :NEW    PLAN:  PT FREQUENCY: 2x/week  PT DURATION: 4 weeks  PLANNED INTERVENTIONS: Therapeutic exercises, Patient/Family education, Self Care, Orthotic/Fit training, Manual lymph drainage, Compression bandaging, scar mobilization, Manual therapy, and Re-evaluation  PLAN FOR NEXT SESSION:  work towards independence with compression bandaging and consider flat knit? ; AAROM supine, instruct family member in MLD to left UE and left lateral trunk, STM to left pectorals/UT prn, PROM. Pts granddaughter to try and find sports bra instead of compression bra that would cost $160.00  Waynette Buttery, PT 07/21/2022, 12:03 PM

## 2022-07-24 ENCOUNTER — Ambulatory Visit: Payer: Medicare PPO | Attending: Hematology and Oncology

## 2022-07-24 DIAGNOSIS — Z170 Estrogen receptor positive status [ER+]: Secondary | ICD-10-CM | POA: Diagnosis not present

## 2022-07-24 DIAGNOSIS — M25612 Stiffness of left shoulder, not elsewhere classified: Secondary | ICD-10-CM | POA: Diagnosis not present

## 2022-07-24 DIAGNOSIS — R293 Abnormal posture: Secondary | ICD-10-CM | POA: Diagnosis not present

## 2022-07-24 DIAGNOSIS — Z483 Aftercare following surgery for neoplasm: Secondary | ICD-10-CM | POA: Diagnosis not present

## 2022-07-24 DIAGNOSIS — I890 Lymphedema, not elsewhere classified: Secondary | ICD-10-CM | POA: Insufficient documentation

## 2022-07-24 DIAGNOSIS — C50312 Malignant neoplasm of lower-inner quadrant of left female breast: Secondary | ICD-10-CM | POA: Diagnosis not present

## 2022-07-24 NOTE — Therapy (Signed)
OUTPATIENT PHYSICAL THERAPY  UPPER EXTREMITY ONCOLOGY TREATMENT  Patient Name: JERICAH STEINHILBER MRN: [id number] DOB:1934/08/07, 88 y.o., female Today's Date: 07/24/2022  END OF SESSION:  PT End of Session - 07/24/22 1620     Visit Number 12    Number of Visits 19    Date for PT Re-Evaluation 08/18/22    PT Start Time 1615    PT Stop Time 1709    PT Time Calculation (min) 54 min    Activity Tolerance Patient tolerated treatment well    Behavior During Therapy [facility] for tasks assessed/performed               Past Medical History:  Diagnosis Date   Abnormality of gait 04/18/2017   Degenerative joint disease of knee, right 03/19/2018   Injected April 18, 2018 Approved for Durolane R knee 3.4.20. Patient given another steroid injection Jun 24, 2018.  Repeat injection September 09, 2018 repeat injection February 27, 2019 repeat 6/110/20221 bilateral injections given December 01, 2019 Right knee injected April 20, 2020 monovisc May 19, 2018 Repeat steroid injection given June 16, 2020 repeat injection August 24, 2020   Essential hypertension 08/14/2016   Generalized anxiety disorder 06/27/2017   History of dizziness 06/27/2017   History of hysterectomy for benign disease 06/27/2017   History of vitamin D deficiency 08/22/2017   Leukocytes in urine 08/14/2016   Lumbar facet arthropathy 06/27/2017   Major neurocognitive disorder due to multiple etiologies 09/29/2020   Mixed hyperlipidemia 08/22/2017   Osteoarthritis of knee 09/18/2017   Pancolitis 08/14/2016   Parkinson's disease 03/13/2017   Prediabetes 08/22/2017   Right knee pain    Spinal stenosis of lumbar region    Weakness of both hips 04/18/2017   White coat syndrome without diagnosis of hypertension 08/22/2017   Past Surgical History:  Procedure Laterality Date   HYSTERECTOMY ABDOMINAL WITH SALPINGECTOMY     MASTECTOMY W/ SENTINEL NODE BIOPSY Left 08/18/2021   Procedure: LEFT MASTECTOMY WITH TARGETED LYMPH NODE DISSECTION;   Surgeon: Abigail Miyamoto, MD;  Location: [facility];  Service: General;  Laterality: Left;   TONSILLECTOMY     Patient Active Problem List   Diagnosis Date Noted   Gastroesophageal reflux disease without esophagitis 03/19/2022   Heartburn 03/19/2022   S/P left mastectomy 08/18/2021   Genetic testing 07/25/2021   Family history of breast cancer 07/14/2021   Malignant neoplasm of lower-inner quadrant of left breast in female, estrogen receptor positive (HCC) 07/11/2021   Mild episode of recurrent major depressive disorder (HCC) 04/06/2021   Age-related osteoporosis without current pathological fracture 04/06/2021   Greater trochanteric bursitis of right hip 01/21/2021   Major neurocognitive disorder due to multiple etiologies 09/29/2020   Right knee pain 05/07/2018   Osteoarthritis of knees, bilateral 03/19/2018   Osteoarthritis of knee 09/18/2017   Mixed hyperlipidemia 08/22/2017   Prediabetes 08/22/2017   White coat syndrome without diagnosis of hypertension 08/22/2017   History of vitamin D deficiency 08/22/2017   Lumbar facet arthropathy 06/27/2017   History of hysterectomy for benign disease 06/27/2017   Generalized anxiety disorder 06/27/2017   History of dizziness 06/27/2017   Other fatigue 06/27/2017   Overweight (BMI 25.0-29.9) 06/27/2017   Weakness of both hips 04/18/2017   Abnormality of gait 04/18/2017   Spinal stenosis of lumbar region 04/18/2017   Parkinson's disease 03/13/2017   Pancolitis 08/14/2016   Essential hypertension 08/14/2016   Leukocytes in urine 08/14/2016       REFERRING PROVIDER: Serena Croissant MD  REFERRING DIAG: Left UE lymphedema  THERAPY DIAG:  Lymphedema, not elsewhere classified  Aftercare following surgery for neoplasm  Stiffness of left shoulder, not elsewhere classified  Malignant neoplasm of lower-inner quadrant of left breast in female, estrogen receptor positive (HCC)  Abnormal posture  ONSET DATE:  06/12/2022  Rationale for Evaluation and Treatment: Rehabilitation  SUBJECTIVE:                                                                                                                                                                                           SUBJECTIVE STATEMENT:  The bandaging is going fine. I'm tolerating it well. My granddaughter [name] is doing a good job wrapping it for me.   PERTINENT HISTORY:  Patient was diagnosed on 06/27/2021 with left grade 2 invasive ductal carcinoma breast cancer. She underwent a left mastectomy and targeted axillary lymph node dissection (7/7 nodes positive) on 08/18/2021. It is triple positive with a Ki67 of 40%. She had radiation. Patient has cognitive deficits effecting memory and was diagnosed with Parkinson's in 2020   PAIN:  Are you having pain? No chest is just tender to touch  PRECAUTIONS: Left UE lymphedema, Parkinsons, neuro cognitive disorder, OA , spinal stenosis, GAD  WEIGHT BEARING RESTRICTIONS: No  FALLS:  Has patient fallen in last 6 months? No  LIVING ENVIRONMENT: Lives with: lives alone, but has a family member with her every night, and they cook dinner for her Lives in: House/apartment Stairs: Yes; External: 4 steps; can reach both Has following equipment at home: Single point cane, Walker - 2 wheeled, and Grab bars  OCCUPATION: NA  LEISURE: read,  HAND DOMINANCE: right   PRIOR LEVEL OF FUNCTION: Independent with basic ADLs  PATIENT GOALS: Learn how to manage swelling   OBJECTIVE:  COGNITION: Overall cognitive status: Impaired   PALPATION: Tender left chest and left lateral trunk  OBSERVATIONS / OTHER ASSESSMENTS: mild redness left prox arm, swelling visible and palpable at left lateral trunk  SENSATION: Light touch: Appears intact    POSTURE: forward head, rounded shoulders  UPPER EXTREMITY AROM/PROM:  A/PROM RIGHT   eval   Shoulder extension   Shoulder flexion 127  Shoulder abduction 124   Shoulder internal rotation   Shoulder external rotation     (Blank rows = not tested)  A/PROM LEFT   eval Left 07/05/22  Shoulder extension    Shoulder flexion 126 pulls at ribs 130 pulls at ribs, 138 after P/ROM  Shoulder abduction 115 118, 125 after P/ROM  Shoulder internal rotation    Shoulder external rotation      (Blank rows = not tested)  CERVICAL  AROM: All within functional limits:  UPPER EXTREMITY STRENGTH:   LYMPHEDEMA ASSESSMENTS:  SURGERY TYPE/DATE: 08/18/21 Left Mastectomy with NUMBER OF LYMPH NODES REMOVED: 7/7 CHEMOTHERAPY: NO RADIATION:Yes, completed 11/09/2021 HORMONE TREATMENT: Letrozole INFECTIONS: NO Herceptin infusions will end May 14  LYMPHEDEMA ASSESSMENTS:   LANDMARK RIGHT  eval RIGHT 07/21/2022  At axilla  34.5 33.4  15 cm proximal to olecranon process 33.8 32.9  10 cm proximal to olecranon process 31.5 31.5  Olecranon process 26.5 26.0  15 cm proximal to ulnar styloid process 25.4 25.4  10 cm proximal to ulnar styloid process 22.8 23.3  Just proximal to ulnar styloid process 16 16.05  Across hand at thumb web space 18.1 18.1  At base of 2nd digit 6.0 6.0  (Blank rows = not tested)  LANDMARK LEFT  eval LEFT 07/21/22 LEFT 07/24/22  At axilla  33.8 32.8 33  15 cm proximal to olecranon process 32.4 31.8 30.3  10 cm proximal to olecranon process 31.8 31.1 30.4  Olecranon process 25.8 24.6 25  15  cm proximal to ulnar styloid process 25 24.8 25.3  10 cm proximal to ulnar styloid process 22.4 22.7 23  Just proximal to ulnar styloid process 16.1 16.1 15.6  Across hand at thumb web space 18.1 17.9 17.4  At base of 2nd digit 6.0 6.0 5.9  (Blank rows = not tested)   L-DEX LYMPHEDEMA SCREENING: The patient was assessed using the L-Dex machine today to produce a lymphedema index baseline score. The patient will be reassessed on a regular basis (typically every 3 months) to obtain new L-Dex scores. If the score is > 6.5 points away from his/her  baseline score indicating onset of subclinical lymphedema, it will be recommended to wear a compression garment for 4 weeks, 12 hours per day and then be reassessed. If the score continues to be > 6.5 points from baseline at reassessment, we will initiate lymphedema treatment. Assessing in this manner has a 95% rate of preventing clinically significant lymphedema.  LLIS: 6  TODAY'S TREATMENT:  07/24/22: Manual Therapy Removed bandages that [name] had donned for pt yesterday assessing technique while removing. She did very well with tension of bandages and spacing. She just required cuing on correct pattern for hand bandage but overall was a well constructed and safe bandage.  Circumference Measurements retaken, good reductions noted throughout except for forearm and elbow slight increase.  MLD to Lt UE in supine as follows: Short neck, 5 diaphragmatic breaths, Lt inguinal and Rt axillary nodes,  Lt axillo-inguinal and anterior inter-axillary anastomosis, then focused on Lt UE working from proximal to distal then retracing all steps Compression Bandaging to Lt UE as follows: Cocoa butter, TG soft, Elastomull to fingers 1-4, Artiflex x1 from wrist to axilla with adding 1/4" gray compression foam to forearm, 1-6 cm to hand with adding 1/2" gray compression foam to dorsal hand/wrist and instructing [name] while performing and reviewing correct technique, 1-8 cm from wrist and "X" at elbow, then 1-10 cm short stretch bandages from wrist to axilla.   07/21/2022 Pts son and grand daughter in attendance Wraps removed and Pts bilateral arm circumference measured Pts. Grand daughter performed Compression Bandaging to Lt UE as follows: Cocoa butter (applied by PT), TG soft, Elastomull to fingers 1-4 under TG soft, Artiflex x1 from wrist to axilla, 1-6 cm to hand, 1-8 cm from wrist and "X" at elbow, then 1-10 cm short stretch bandages from wrist to axilla. Pts grandaughter did all wrapping with only occasional VC's and  demonstration byt therapist. Pts grandaughter showed pt how to exercise to loosen her wraps. Reminded all that wraps need to be removed if she is having numbness, pain or any discomfort.  07/18/22: Pts son Brett Canales and Granddaughter [name] in attendance Redid SOZO and pt is still in the red and elevated from last SOZO done 1 month ago so will incorporate compression bandaging today. Self Care Spent time explaining benefits of compression bandaging to pt, pts son and granddaughter and answered their questions about this. Manual Therapy Compression Bandaging to Lt UE as follows: Cocoa butter, TG soft, Elastomull to fingers 1-4, Artiflex x1 from wrist to axilla, 1-6 cm to hand, 1-8 cm from wrist and "X" at elbow, then 1-10 cm short stretch bandages from wrist to axilla beginning to instruct pts granddaughter in this. Issued handout and she recorded, with pts permission, therapist performing this with her phone for further reference at home.   07/14/2022 Pts son Brett Canales and Granddaughter [name] in attendance  STM to left UT, pectorals and lateral trunk with cocoa butter  PROM left shoulder flexion, scaption, abd, ER MFR left chest region MLD to Lt UE in supine as follows: Short neck, 5 diaphragmatic breaths, Lt inguinal and Rt axillary nodes,  Lt axillo-inguinal and anterior inter-axillary anastomosis, then focused on Lt UE working from proximal to distal then retracing all steps with granddaughter watching for review. Son educated about lymphatics, gentleness/pathways etc. He is going to talk with his mom to see if she would be comfortable with him helping her. They are also looking for a caregiving company for several hours a day.                                                                                                                                        PATIENT EDUCATION:  Education details: POC, try compression bra for trunk edema, wash sleeve every 2 days at a minimum Person educated: Patient  and granddaughter [name] Education method: Explanation Education comprehension: verbalized understanding  HOME EXERCISE PROGRAM: Self MLD, post op exercises,   ASSESSMENT:  CLINICAL IMPRESSION: Pt is tolerating her bandages very well and reports her granddaughter [name] is doing a good job of this for her at home. See above for bandaging instruction offered today. Added compression foam at dorsum of hand and forearm where pt was slightly elevated, though this is probable due to decrease at hand and wrist. [name] was instructed how to incorporate foam into bandages. She was able to verbalize good understanding.   OBJECTIVE IMPAIRMENTS: decreased activity tolerance, decreased cognition, decreased ROM, increased edema, impaired UE functional use, postural dysfunction, and pain.   ACTIVITY LIMITATIONS: carrying, lifting, bathing, and reach over head  PARTICIPATION LIMITATIONS: meal prep, medication management, personal finances, driving, and community activity  PERSONAL FACTORS:Left Breast Cancer s/p radiation, Parkinsons, neuro cognitive disorder, OA , spinal stenosis, GAD are also affecting patient's functional outcome.   REHAB  POTENTIAL: Good  CLINICAL DECISION MAKING: Stable/uncomplicated  EVALUATION COMPLEXITY: Low  GOALS: Goals reviewed with patient? Yes  SHORT TERM GOALS: Target date: 07/21/2022  Pt/family member will be educated in MLD for left UE lymphedema Baseline: Goal status: MET  2.  Pt will have left shoulder ROM equal to right for flexion and abduction Baseline:  Goal status: Ongoing  3.  Pt will have decreased tenderness left chest and trunk area Baseline:  Goal status: Ongoing 4.  Pt will get benefit from compression bra for lateral trunk swelling Baseline:  Goal status: Ongoing 5. Pt will be tolerant of compression bandaging  Gaol Status: NEW  6. Pt will be fit for appropriate Compression garment for Left UE lymphedema.  GOAL STATUS :NEW    PLAN:  PT  FREQUENCY: 2x/week  PT DURATION: 4 weeks  PLANNED INTERVENTIONS: Therapeutic exercises, Patient/Family education, Self Care, Orthotic/Fit training, Manual lymph drainage, Compression bandaging, scar mobilization, Manual therapy, and Re-evaluation  PLAN FOR NEXT SESSION:  work towards independence with compression bandaging and consider flat knit? ; AAROM supine, instruct family member in MLD to left UE and left lateral trunk, STM to left pectorals/UT prn, PROM. Pts granddaughter to try and find sports bra instead of compression bra that would cost $160.00  07/24/22: Demographics sent to [facility] for compression sleeve insurance verification  Hermenia Bers, PTA 07/24/2022, 5:11 PM

## 2022-07-26 ENCOUNTER — Ambulatory Visit: Payer: Medicare PPO

## 2022-07-26 DIAGNOSIS — Z483 Aftercare following surgery for neoplasm: Secondary | ICD-10-CM

## 2022-07-26 DIAGNOSIS — C50312 Malignant neoplasm of lower-inner quadrant of left female breast: Secondary | ICD-10-CM | POA: Diagnosis not present

## 2022-07-26 DIAGNOSIS — Z170 Estrogen receptor positive status [ER+]: Secondary | ICD-10-CM | POA: Diagnosis not present

## 2022-07-26 DIAGNOSIS — R293 Abnormal posture: Secondary | ICD-10-CM

## 2022-07-26 DIAGNOSIS — I890 Lymphedema, not elsewhere classified: Secondary | ICD-10-CM

## 2022-07-26 DIAGNOSIS — M25612 Stiffness of left shoulder, not elsewhere classified: Secondary | ICD-10-CM

## 2022-07-26 NOTE — Therapy (Signed)
OUTPATIENT PHYSICAL THERAPY  UPPER EXTREMITY ONCOLOGY TREATMENT  Patient Name: Jamie [name] MRN: [id number] DOB:December 28, 1934, 88 y.o., female Today's Date: 07/26/2022  END OF SESSION:  PT End of Session - 07/26/22 1102     Visit Number 13    Number of Visits 19    Date for PT Re-Evaluation 08/18/22    PT Start Time 1006    PT Stop Time 1059    PT Time Calculation (min) 53 min    Activity Tolerance Patient tolerated treatment well    Behavior During Therapy [facility] for tasks assessed/performed               Past Medical History:  Diagnosis Date   Abnormality of gait 04/18/2017   Degenerative joint disease of knee, right 03/19/2018   Injected April 18, 2018 Approved for Durolane R knee 3.4.20. Patient given another steroid injection Jun 24, 2018.  Repeat injection September 09, 2018 repeat injection February 27, 2019 repeat 6/110/20221 bilateral injections given December 01, 2019 Right knee injected April 20, 2020 monovisc May 19, 2018 Repeat steroid injection given June 16, 2020 repeat injection August 24, 2020   Essential hypertension 08/14/2016   Generalized anxiety disorder 06/27/2017   History of dizziness 06/27/2017   History of hysterectomy for benign disease 06/27/2017   History of vitamin D deficiency 08/22/2017   Leukocytes in urine 08/14/2016   Lumbar facet arthropathy 06/27/2017   Major neurocognitive disorder due to multiple etiologies 09/29/2020   Mixed hyperlipidemia 08/22/2017   Osteoarthritis of knee 09/18/2017   Pancolitis 08/14/2016   Parkinson's disease 03/13/2017   Prediabetes 08/22/2017   Right knee pain    Spinal stenosis of lumbar region    Weakness of both hips 04/18/2017   White coat syndrome without diagnosis of hypertension 08/22/2017   Past Surgical History:  Procedure Laterality Date   HYSTERECTOMY ABDOMINAL WITH SALPINGECTOMY     MASTECTOMY W/ SENTINEL NODE BIOPSY Left 08/18/2021   Procedure: LEFT MASTECTOMY WITH TARGETED LYMPH NODE DISSECTION;   Surgeon: Abigail Miyamoto, MD;  Location: [facility];  Service: General;  Laterality: Left;   TONSILLECTOMY     Patient Active Problem List   Diagnosis Date Noted   Gastroesophageal reflux disease without esophagitis 03/19/2022   Heartburn 03/19/2022   S/P left mastectomy 08/18/2021   Genetic testing 07/25/2021   Family history of breast cancer 07/14/2021   Malignant neoplasm of lower-inner quadrant of left breast in female, estrogen receptor positive (HCC) 07/11/2021   Mild episode of recurrent major depressive disorder (HCC) 04/06/2021   Age-related osteoporosis without current pathological fracture 04/06/2021   Greater trochanteric bursitis of right hip 01/21/2021   Major neurocognitive disorder due to multiple etiologies 09/29/2020   Right knee pain 05/07/2018   Osteoarthritis of knees, bilateral 03/19/2018   Osteoarthritis of knee 09/18/2017   Mixed hyperlipidemia 08/22/2017   Prediabetes 08/22/2017   White coat syndrome without diagnosis of hypertension 08/22/2017   History of vitamin D deficiency 08/22/2017   Lumbar facet arthropathy 06/27/2017   History of hysterectomy for benign disease 06/27/2017   Generalized anxiety disorder 06/27/2017   History of dizziness 06/27/2017   Other fatigue 06/27/2017   Overweight (BMI 25.0-29.9) 06/27/2017   Weakness of both hips 04/18/2017   Abnormality of gait 04/18/2017   Spinal stenosis of lumbar region 04/18/2017   Parkinson's disease 03/13/2017   Pancolitis 08/14/2016   Essential hypertension 08/14/2016   Leukocytes in urine 08/14/2016       REFERRING PROVIDER: Serena Croissant MD  REFERRING DIAG: Left UE lymphedema  THERAPY DIAG:  Lymphedema, not elsewhere classified  Aftercare following surgery for neoplasm  Stiffness of left shoulder, not elsewhere classified  Malignant neoplasm of lower-inner quadrant of left breast in female, estrogen receptor positive (HCC)  Abnormal posture  ONSET DATE:  06/12/2022  Rationale for Evaluation and Treatment: Rehabilitation  SUBJECTIVE:                                                                                                                                                                                           SUBJECTIVE STATEMENT:  The foam was fine.   PERTINENT HISTORY:  Patient was diagnosed on 06/27/2021 with left grade 2 invasive ductal carcinoma breast cancer. She underwent a left mastectomy and targeted axillary lymph node dissection (7/7 nodes positive) on 08/18/2021. It is triple positive with a Ki67 of 40%. She had radiation. Patient has cognitive deficits effecting memory and was diagnosed with Parkinson's in 2020   PAIN:  Are you having pain? No chest is just tender to touch  PRECAUTIONS: Left UE lymphedema, Parkinsons, neuro cognitive disorder, OA , spinal stenosis, GAD  WEIGHT BEARING RESTRICTIONS: No  FALLS:  Has patient fallen in last 6 months? No  LIVING ENVIRONMENT: Lives with: lives alone, but has a family member with her every night, and they cook dinner for her Lives in: House/apartment Stairs: Yes; External: 4 steps; can reach both Has following equipment at home: Single point cane, Walker - 2 wheeled, and Grab bars  OCCUPATION: NA  LEISURE: read,  HAND DOMINANCE: right   PRIOR LEVEL OF FUNCTION: Independent with basic ADLs  PATIENT GOALS: Learn how to manage swelling   OBJECTIVE:  COGNITION: Overall cognitive status: Impaired   PALPATION: Tender left chest and left lateral trunk  OBSERVATIONS / OTHER ASSESSMENTS: mild redness left prox arm, swelling visible and palpable at left lateral trunk  SENSATION: Light touch: Appears intact    POSTURE: forward head, rounded shoulders  UPPER EXTREMITY AROM/PROM:  A/PROM RIGHT   eval   Shoulder extension   Shoulder flexion 127  Shoulder abduction 124  Shoulder internal rotation   Shoulder external rotation     (Blank rows = not  tested)  A/PROM LEFT   eval Left 07/05/22  Shoulder extension    Shoulder flexion 126 pulls at ribs 130 pulls at ribs, 138 after P/ROM  Shoulder abduction 115 118, 125 after P/ROM  Shoulder internal rotation    Shoulder external rotation      (Blank rows = not tested)  CERVICAL AROM: All within functional limits:  UPPER EXTREMITY STRENGTH:   LYMPHEDEMA ASSESSMENTS:  SURGERY TYPE/DATE: 08/18/21  Left Mastectomy with NUMBER OF LYMPH NODES REMOVED: 7/7 CHEMOTHERAPY: NO RADIATION:Yes, completed 11/09/2021 HORMONE TREATMENT: Letrozole INFECTIONS: NO Herceptin infusions will end May 14  LYMPHEDEMA ASSESSMENTS:   LANDMARK RIGHT  eval RIGHT 07/21/2022  At axilla  34.5 33.4  15 cm proximal to olecranon process 33.8 32.9  10 cm proximal to olecranon process 31.5 31.5  Olecranon process 26.5 26.0  15 cm proximal to ulnar styloid process 25.4 25.4  10 cm proximal to ulnar styloid process 22.8 23.3  Just proximal to ulnar styloid process 16 16.05  Across hand at thumb web space 18.1 18.1  At base of 2nd digit 6.0 6.0  (Blank rows = not tested)  LANDMARK LEFT  eval LEFT 07/21/22 LEFT 07/24/22  At axilla  33.8 32.8 33  15 cm proximal to olecranon process 32.4 31.8 30.3  10 cm proximal to olecranon process 31.8 31.1 30.4  Olecranon process 25.8 24.6 25  15  cm proximal to ulnar styloid process 25 24.8 25.3  10 cm proximal to ulnar styloid process 22.4 22.7 23  Just proximal to ulnar styloid process 16.1 16.1 15.6  Across hand at thumb web space 18.1 17.9 17.4  At base of 2nd digit 6.0 6.0 5.9  (Blank rows = not tested)   L-DEX LYMPHEDEMA SCREENING: The patient was assessed using the L-Dex machine today to produce a lymphedema index baseline score. The patient will be reassessed on a regular basis (typically every 3 months) to obtain new L-Dex scores. If the score is > 6.5 points away from his/her baseline score indicating onset of subclinical lymphedema, it will be recommended to  wear a compression garment for 4 weeks, 12 hours per day and then be reassessed. If the score continues to be > 6.5 points from baseline at reassessment, we will initiate lymphedema treatment. Assessing in this manner has a 95% rate of preventing clinically significant lymphedema.  LLIS: 6  TODAY'S TREATMENT:  07/26/22: Removed bandages that [name] had donned for pt yesterday assessing technique while removing. Minor correction for hand bandage to have more of pattern on hand instead of wrist.  MLD to Lt UE in supine as follows: Short neck, 5 diaphragmatic breaths, Lt inguinal and Rt axillary nodes,  Lt axillo-inguinal and anterior inter-axillary anastomosis, then focused on Lt UE working from proximal to distal then retracing all steps Compression Bandaging to Lt UE as follows: Cocoa butter, TG soft, Elastomull to fingers 1-4, Artiflex x1 from wrist to axilla with adding 1/4" gray compression foam to forearm, 1-6 cm to hand with adding 1/2" gray compression foam to dorsal hand/wrist and instructing [name] while performing and reviewing correct technique, 1-8 cm from wrist and "X" at elbow, then 1-10 cm short stretch bandages from wrist to axilla.   07/24/22: Manual Therapy Removed bandages that [name] had donned for pt yesterday assessing technique while removing. She did very well with tension of bandages and spacing. She just required cuing on correct pattern for hand bandage but overall was a well constructed and safe bandage.  Circumference Measurements retaken, good reductions noted throughout except for forearm and elbow slight increase.  MLD to Lt UE in supine as follows: Short neck, 5 diaphragmatic breaths, Lt inguinal and Rt axillary nodes,  Lt axillo-inguinal and anterior inter-axillary anastomosis, then focused on Lt UE working from proximal to distal then retracing all steps Compression Bandaging to Lt UE as follows: Cocoa butter, TG soft, Elastomull to fingers 1-4, Artiflex x1 from wrist to axilla  with adding 1/4" gray compression  foam to forearm, 1-6 cm to hand with adding 1/2" gray compression foam to dorsal hand/wrist and instructing [name] while performing and reviewing correct technique, 1-8 cm from wrist and "X" at elbow, then 1-10 cm short stretch bandages from wrist to axilla.   07/21/2022 Pts son and grand daughter in attendance Wraps removed and Pts bilateral arm circumference measured Pts. Grand daughter performed Compression Bandaging to Lt UE as follows: Cocoa butter (applied by PT), TG soft, Elastomull to fingers 1-4 under TG soft, Artiflex x1 from wrist to axilla, 1-6 cm to hand, 1-8 cm from wrist and "X" at elbow, then 1-10 cm short stretch bandages from wrist to axilla. Pts grandaughter did all wrapping with only occasional VC's and demonstration byt therapist. Pts grandaughter showed pt how to exercise to loosen her wraps. Reminded all that wraps need to be removed if she is having numbness, pain or any discomfort.                                                                                                                                         PATIENT EDUCATION:  Education details: POC, try compression bra for trunk edema, wash sleeve every 2 days at a minimum Person educated: Patient and granddaughter [name] Education method: Explanation Education comprehension: verbalized understanding  HOME EXERCISE PROGRAM: Self MLD, post op exercises,   ASSESSMENT:  CLINICAL IMPRESSION: Continued with CDT of Lt UE. Explained to pt and granddaughter that with knowing pt had targeted lymph node dissection and radiation to nodes she may have stage 1 lymphedema now. We will redo SOZO next week to assess. But if that is the case, we caught it very early and this will allow for easier management of symptoms.    OBJECTIVE IMPAIRMENTS: decreased activity tolerance, decreased cognition, decreased ROM, increased edema, impaired UE functional use, postural dysfunction, and pain.    ACTIVITY LIMITATIONS: carrying, lifting, bathing, and reach over head  PARTICIPATION LIMITATIONS: meal prep, medication management, personal finances, driving, and community activity  PERSONAL FACTORS:Left Breast Cancer s/p radiation, Parkinsons, neuro cognitive disorder, OA , spinal stenosis, GAD are also affecting patient's functional outcome.   REHAB POTENTIAL: Good  CLINICAL DECISION MAKING: Stable/uncomplicated  EVALUATION COMPLEXITY: Low  GOALS: Goals reviewed with patient? Yes  SHORT TERM GOALS: Target date: 07/21/2022  Pt/family member will be educated in MLD for left UE lymphedema Baseline: Goal status: MET  2.  Pt will have left shoulder ROM equal to right for flexion and abduction Baseline:  Goal status: Ongoing  3.  Pt will have decreased tenderness left chest and trunk area Baseline:  Goal status: Ongoing 4.  Pt will get benefit from compression bra for lateral trunk swelling Baseline:  Goal status: Ongoing 5. Pt will be tolerant of compression bandaging  Gaol Status: NEW  6. Pt will be fit for appropriate Compression garment for Left UE lymphedema.  GOAL STATUS :NEW  PLAN:  PT FREQUENCY: 2x/week  PT DURATION: 4 weeks  PLANNED INTERVENTIONS: Therapeutic exercises, Patient/Family education, Self Care, Orthotic/Fit training, Manual lymph drainage, Compression bandaging, scar mobilization, Manual therapy, and Re-evaluation  PLAN FOR NEXT SESSION:  Hear back from [facility] yet? work towards independence with compression bandaging and measure for flat knit next session? ; AAROM supine, instruct family member in MLD to left UE and left lateral trunk, STM to left pectorals/UT prn, PROM. Pts granddaughter to try and find sports bra instead of compression bra that would cost $160.00  07/24/22: Demographics sent to [facility] for compression sleeve insurance verification  Hermenia Bers, PTA 07/26/2022, 11:09 AM

## 2022-07-27 ENCOUNTER — Encounter: Payer: Self-pay | Admitting: Neurology

## 2022-07-27 DIAGNOSIS — G20A1 Parkinson's disease without dyskinesia, without mention of fluctuations: Secondary | ICD-10-CM

## 2022-07-27 DIAGNOSIS — F0280 Dementia in other diseases classified elsewhere without behavioral disturbance: Secondary | ICD-10-CM

## 2022-08-01 ENCOUNTER — Ambulatory Visit: Payer: Medicare PPO

## 2022-08-01 DIAGNOSIS — R293 Abnormal posture: Secondary | ICD-10-CM

## 2022-08-01 DIAGNOSIS — Z170 Estrogen receptor positive status [ER+]: Secondary | ICD-10-CM

## 2022-08-01 DIAGNOSIS — I890 Lymphedema, not elsewhere classified: Secondary | ICD-10-CM

## 2022-08-01 DIAGNOSIS — C50312 Malignant neoplasm of lower-inner quadrant of left female breast: Secondary | ICD-10-CM | POA: Diagnosis not present

## 2022-08-01 DIAGNOSIS — M25612 Stiffness of left shoulder, not elsewhere classified: Secondary | ICD-10-CM | POA: Diagnosis not present

## 2022-08-01 DIAGNOSIS — Z483 Aftercare following surgery for neoplasm: Secondary | ICD-10-CM

## 2022-08-01 NOTE — Therapy (Signed)
OUTPATIENT PHYSICAL THERAPY  UPPER EXTREMITY ONCOLOGY TREATMENT  Patient Name: Jamie [name] MRN: [id number] DOB:1934-11-30, 88 y.o., female Today's Date: 08/01/2022  END OF SESSION:  PT End of Session - 08/01/22 0901     Visit Number 14    Number of Visits 19    Date for PT Re-Evaluation 08/18/22    PT Start Time 0902    PT Stop Time 1008    PT Time Calculation (min) 66 min    Activity Tolerance Patient tolerated treatment well    Behavior During Therapy [facility] for tasks assessed/performed               Past Medical History:  Diagnosis Date   Abnormality of gait 04/18/2017   Degenerative joint disease of knee, right 03/19/2018   Injected April 18, 2018 Approved for Durolane R knee 3.4.20. Patient given another steroid injection Jun 24, 2018.  Repeat injection September 09, 2018 repeat injection February 27, 2019 repeat 6/110/20221 bilateral injections given December 01, 2019 Right knee injected April 20, 2020 monovisc May 19, 2018 Repeat steroid injection given June 16, 2020 repeat injection August 24, 2020   Essential hypertension 08/14/2016   Generalized anxiety disorder 06/27/2017   History of dizziness 06/27/2017   History of hysterectomy for benign disease 06/27/2017   History of vitamin D deficiency 08/22/2017   Leukocytes in urine 08/14/2016   Lumbar facet arthropathy 06/27/2017   Major neurocognitive disorder due to multiple etiologies 09/29/2020   Mixed hyperlipidemia 08/22/2017   Osteoarthritis of knee 09/18/2017   Pancolitis 08/14/2016   Parkinson's disease 03/13/2017   Prediabetes 08/22/2017   Right knee pain    Spinal stenosis of lumbar region    Weakness of both hips 04/18/2017   White coat syndrome without diagnosis of hypertension 08/22/2017   Past Surgical History:  Procedure Laterality Date   HYSTERECTOMY ABDOMINAL WITH SALPINGECTOMY     MASTECTOMY W/ SENTINEL NODE BIOPSY Left 08/18/2021   Procedure: LEFT MASTECTOMY WITH TARGETED LYMPH NODE DISSECTION;   Surgeon: Abigail Miyamoto, MD;  Location: [facility];  Service: General;  Laterality: Left;   TONSILLECTOMY     Patient Active Problem List   Diagnosis Date Noted   Gastroesophageal reflux disease without esophagitis 03/19/2022   Heartburn 03/19/2022   S/P left mastectomy 08/18/2021   Genetic testing 07/25/2021   Family history of breast cancer 07/14/2021   Malignant neoplasm of lower-inner quadrant of left breast in female, estrogen receptor positive (HCC) 07/11/2021   Mild episode of recurrent major depressive disorder (HCC) 04/06/2021   Age-related osteoporosis without current pathological fracture 04/06/2021   Greater trochanteric bursitis of right hip 01/21/2021   Major neurocognitive disorder due to multiple etiologies 09/29/2020   Right knee pain 05/07/2018   Osteoarthritis of knees, bilateral 03/19/2018   Osteoarthritis of knee 09/18/2017   Mixed hyperlipidemia 08/22/2017   Prediabetes 08/22/2017   White coat syndrome without diagnosis of hypertension 08/22/2017   History of vitamin D deficiency 08/22/2017   Lumbar facet arthropathy 06/27/2017   History of hysterectomy for benign disease 06/27/2017   Generalized anxiety disorder 06/27/2017   History of dizziness 06/27/2017   Other fatigue 06/27/2017   Overweight (BMI 25.0-29.9) 06/27/2017   Weakness of both hips 04/18/2017   Abnormality of gait 04/18/2017   Spinal stenosis of lumbar region 04/18/2017   Parkinson's disease 03/13/2017   Pancolitis 08/14/2016   Essential hypertension 08/14/2016   Leukocytes in urine 08/14/2016       REFERRING PROVIDER: Serena Croissant MD  REFERRING DIAG: Left UE lymphedema  THERAPY DIAG:  Lymphedema, not elsewhere classified  Aftercare following surgery for neoplasm  Stiffness of left shoulder, not elsewhere classified  Malignant neoplasm of lower-inner quadrant of left breast in female, estrogen receptor positive (HCC)  Abnormal posture  ONSET DATE:  06/12/2022  Rationale for Evaluation and Treatment: Rehabilitation  SUBJECTIVE:                                                                                                                                                                                           SUBJECTIVE STATEMENT:  The wrap was comfortable. I had a bad night last night and couldn't sleep.Still sore at the chest with tenderness when touched, but otherwise not noticing pain. Got sports bra last week and it covers under the arm well.  PERTINENT HISTORY:  Patient was diagnosed on 06/27/2021 with left grade 2 invasive ductal carcinoma breast cancer. She underwent a left mastectomy and targeted axillary lymph node dissection (7/7 nodes positive) on 08/18/2021. It is triple positive with a Ki67 of 40%. She had radiation. Patient has cognitive deficits effecting memory and was diagnosed with Parkinson's in 2020   PAIN:  Are you having pain? No chest is just tender to touch  PRECAUTIONS: Left UE lymphedema, Parkinsons, neuro cognitive disorder, OA , spinal stenosis, GAD  WEIGHT BEARING RESTRICTIONS: No  FALLS:  Has patient fallen in last 6 months? No  LIVING ENVIRONMENT: Lives with: lives alone, but has a family member with her every night, and they cook dinner for her Lives in: House/apartment Stairs: Yes; External: 4 steps; can reach both Has following equipment at home: Single point cane, Walker - 2 wheeled, and Grab bars  OCCUPATION: NA  LEISURE: read,  HAND DOMINANCE: right   PRIOR LEVEL OF FUNCTION: Independent with basic ADLs  PATIENT GOALS: Learn how to manage swelling   OBJECTIVE:  COGNITION: Overall cognitive status: Impaired   PALPATION: Tender left chest and left lateral trunk  OBSERVATIONS / OTHER ASSESSMENTS: mild redness left prox arm, swelling visible and palpable at left lateral trunk  SENSATION: Light touch: Appears intact    POSTURE: forward head, rounded shoulders  UPPER EXTREMITY  AROM/PROM:  A/PROM RIGHT   eval   Shoulder extension   Shoulder flexion 127  Shoulder abduction 124  Shoulder internal rotation   Shoulder external rotation     (Blank rows = not tested)  A/PROM LEFT   eval Left 07/05/22  Shoulder extension    Shoulder flexion 126 pulls at ribs 130 pulls at ribs, 138 after P/ROM  Shoulder abduction 115 118, 125 after P/ROM  Shoulder internal rotation  Shoulder external rotation      (Blank rows = not tested)  CERVICAL AROM: All within functional limits:  UPPER EXTREMITY STRENGTH:   LYMPHEDEMA ASSESSMENTS:  SURGERY TYPE/DATE: 08/18/21 Left Mastectomy with NUMBER OF LYMPH NODES REMOVED: 7/7 CHEMOTHERAPY: NO RADIATION:Yes, completed 11/09/2021 HORMONE TREATMENT: Letrozole INFECTIONS: NO Herceptin infusions will end May 14  LYMPHEDEMA ASSESSMENTS:   LANDMARK RIGHT  eval RIGHT 07/21/2022 Right 08/01/22  At axilla  34.5 33.4 34.1  15 cm proximal to olecranon process 33.8 32.9   10 cm proximal to olecranon process 31.5 31.5 31.3  Olecranon process 26.5 26.0 26.2  15 cm proximal to ulnar styloid process 25.4 25.4   10 cm proximal to ulnar styloid process 22.8 23.3 22.7  Just proximal to ulnar styloid process 16 16.05 16.1  Across hand at thumb web space 18.1 18.1 17.4  At base of 2nd digit 6.0 6.0 6.0  (Blank rows = not tested)  LANDMARK LEFT  eval LEFT 07/21/22 LEFT 07/24/22 LEFT 08/01/2022  At axilla  33.8 32.8 33 32  15 cm proximal to olecranon process 32.4 31.8 30.3 31.0  10 cm proximal to olecranon process 31.8 31.1 30.4 31.8  Olecranon process 25.8 24.6 25 25.1  15 cm proximal to ulnar styloid process 25 24.8 25.3 25  10  cm proximal to ulnar styloid process 22.4 22.7 23 22.7  Just proximal to ulnar styloid process 16.1 16.1 15.6 15.9  Across hand at thumb web space 18.1 17.9 17.4 17.7  At base of 2nd digit 6.0 6.0 5.9 5.8  (Blank rows = not tested)   L-DEX LYMPHEDEMA SCREENING: The patient was assessed using the L-Dex  machine today to produce a lymphedema index baseline score. The patient will be reassessed on a regular basis (typically every 3 months) to obtain new L-Dex scores. If the score is > 6.5 points away from his/her baseline score indicating onset of subclinical lymphedema, it will be recommended to wear a compression garment for 4 weeks, 12 hours per day and then be reassessed. If the score continues to be > 6.5 points from baseline at reassessment, we will initiate lymphedema treatment. Assessing in this manner has a 95% rate of preventing clinically significant lymphedema.  LLIS: 6  TODAY'S TREATMENT:   08/01/2022 Removed bandages and pts grand daughter helped her wash her arm. Checked sports bra and it does cover the lateral trunk area of swelling. Foam pad made for lower border of bra to prevent sliding Pt measured for chart notes and garments and spoke with caregivers about ordering on line to get her into garments sooner MLD to Lt UE in supine as follows: Short neck, 5 diaphragmatic breaths, Lt inguinal and Rt axillary nodes,  Lt axillo-inguinal and anterior inter-axillary anastomosis, then focused on Lt UE working from proximal to distal then retracing all steps Compression Bandaging to Lt UE as follows: Cocoa butter, TG soft, Elastomull to fingers 1-4, Artiflex x1 from wrist to axilla with adding 1/4" gray compression foam to forearm, 1-6 cm to hand with adding 1/2" gray compression foam to dorsal hand/wrist , 1-8 cm from wrist and  "X" at elbow, then 1-10 cm short stretch bandages from wrist to axilla.  Pt fits into Sigvaris SECURE sleeve size M1 short, and size Small gauntlet; texted her grand daughter to have her order.   07/26/22: Removed bandages that [name] had donned for pt yesterday assessing technique while removing. Minor correction for hand bandage to have more of pattern on hand instead of wrist.  MLD  to Lt UE in supine as follows: Short neck, 5 diaphragmatic breaths, Lt inguinal and Rt  axillary nodes,  Lt axillo-inguinal and anterior inter-axillary anastomosis, then focused on Lt UE working from proximal to distal then retracing all steps Compression Bandaging to Lt UE as follows: Cocoa butter, TG soft, Elastomull to fingers 1-4, Artiflex x1 from wrist to axilla with adding 1/4" gray compression foam to forearm, 1-6 cm to hand with adding 1/2" gray compression foam to dorsal hand/wrist and instructing [name] while performing and reviewing correct technique, 1-8 cm from wrist and "X" at elbow, then 1-10 cm short stretch bandages from wrist to axilla.   07/24/22: Manual Therapy Removed bandages that [name] had donned for pt yesterday assessing technique while removing. She did very well with tension of bandages and spacing. She just required cuing on correct pattern for hand bandage but overall was a well constructed and safe bandage.  Circumference Measurements retaken, good reductions noted throughout except for forearm and elbow slight increase.  MLD to Lt UE in supine as follows: Short neck, 5 diaphragmatic breaths, Lt inguinal and Rt axillary nodes,  Lt axillo-inguinal and anterior inter-axillary anastomosis, then focused on Lt UE working from proximal to distal then retracing all steps Compression Bandaging to Lt UE as follows: Cocoa butter, TG soft, Elastomull to fingers 1-4, Artiflex x1 from wrist to axilla with adding 1/4" gray compression foam to forearm, 1-6 cm to hand with adding 1/2" gray compression foam to dorsal hand/wrist and instructing [name] while performing and reviewing correct technique, 1-8 cm from wrist and "X" at elbow, then 1-10 cm short stretch bandages from wrist to axilla.   07/21/2022 Pts son and grand daughter in attendance Wraps removed and Pts bilateral arm circumference measured Pts. Grand daughter performed Compression Bandaging to Lt UE as follows: Cocoa butter (applied by PT), TG soft, Elastomull to fingers 1-4 under TG soft, Artiflex x1 from wrist to axilla,  1-6 cm to hand, 1-8 cm from wrist and "X" at elbow, then 1-10 cm short stretch bandages from wrist to axilla. Pts grandaughter did all wrapping with only occasional VC's and demonstration byt therapist. Pts grandaughter showed pt how to exercise to loosen her wraps. Reminded all that wraps need to be removed if she is having numbness, pain or any discomfort.                                                                                                                                         PATIENT EDUCATION:  Education details: POC, try compression bra for trunk edema, wash sleeve every 2 days at a minimum Person educated: Patient and granddaughter [name] Education method: Explanation Education comprehension: verbalized understanding  HOME EXERCISE PROGRAM: Self MLD, post op exercises,   ASSESSMENT:  CLINICAL IMPRESSION: Pt was measured for garments today and her granddaughter was texted with the information for ordering Sigvaris Secure sleeve, M1 and Gauntlet  size small CCL 20-30. Pts measurements up in some areas and down in others. Pts family found a sports bra that fastens in the front to put compression on lateral trunk, and it is comfortable for pt. Pt to decrease to 1x /week. SOZO next OBJECTIVE IMPAIRMENTS: decreased activity tolerance, decreased cognition, decreased ROM, increased edema, impaired UE functional use, postural dysfunction, and pain.   ACTIVITY LIMITATIONS: carrying, lifting, bathing, and reach over head  PARTICIPATION LIMITATIONS: meal prep, medication management, personal finances, driving, and community activity  PERSONAL FACTORS:Left Breast Cancer s/p radiation, Parkinsons, neuro cognitive disorder, OA , spinal stenosis, GAD are also affecting patient's functional outcome.   REHAB POTENTIAL: Good  CLINICAL DECISION MAKING: Stable/uncomplicated  EVALUATION COMPLEXITY: Low  GOALS: Goals reviewed with patient? Yes  SHORT TERM GOALS: Target date:  07/21/2022  Pt/family member will be educated in MLD for left UE lymphedema Baseline: Goal status: MET  2.  Pt will have left shoulder ROM equal to right for flexion and abduction Baseline:  Goal status: Ongoing  3.  Pt will have decreased tenderness left chest and trunk area Baseline:  Goal status: Ongoing 4.  Pt will get benefit from compression bra for lateral trunk swelling Baseline:  Goal status:MET;purchased sports bra  5. Pt will be tolerant of compression bandaging  Gaol Status: MET 6/10  6. Pt will be fit for appropriate Compression garment for Left UE lymphedema.  GOAL STATUS :NEW    PLAN:  PT FREQUENCY: 2x/week  PT DURATION: 4 weeks  PLANNED INTERVENTIONS: Therapeutic exercises, Patient/Family education, Self Care, Orthotic/Fit training, Manual lymph drainage, Compression bandaging, scar mobilization, Manual therapy, and Re-evaluation  PLAN FOR NEXT SESSION:  SOZO screen, Hear back from [facility] yet? work towards independence with compression bandaging and measure for flat knit next session? ; AAROM supine, instruct family member in MLD to left UE and left lateral trunk, STM to left pectorals/UT prn, PROM. Pts granddaughter to try and find sports bra instead of compression bra that would cost $160.00  07/24/22: Demographics sent to [facility] for compression sleeve insurance verification  Waynette Buttery, PT 08/01/2022, 11:19 AM

## 2022-08-02 ENCOUNTER — Ambulatory Visit: Payer: Medicare PPO | Admitting: Family Medicine

## 2022-08-02 ENCOUNTER — Encounter: Payer: Self-pay | Admitting: Family Medicine

## 2022-08-02 VITALS — BP 127/77 | HR 77 | Resp 18 | Ht 68.0 in | Wt 179.0 lb

## 2022-08-02 DIAGNOSIS — R140 Abdominal distension (gaseous): Secondary | ICD-10-CM | POA: Diagnosis not present

## 2022-08-02 NOTE — Progress Notes (Signed)
   Acute Office Visit  Subjective:     Patient ID: Jamie [name], female    DOB: 09-Aug-1934, 88 y.o.   MRN: [id number]  Chief Complaint  Patient presents with   Bloated         HPI Patient is in today for "bloated".  Within the past few months, she has been experiencing an increase in abdominal size resulting in needing to go up and shirt and pant sizes.  Denies any changes in bowel habits, appetite.  She is not in any pain.  She does endorse a decreased appetite, but denies feeling full sooner than in the past.  Denies fever, chills, weight loss, melena/hematochezia.  Denies any relation between her symptoms and specific foods or eating in general.  ROS Negative unless otherwise noted in HPI    Objective:    BP 127/77 (BP Location: Right Arm, Patient Position: Sitting, Cuff Size: Normal)   Pulse 77   Resp 18   Ht 5\' 8"  (1.727 m)   Wt 179 lb (81.2 kg)   SpO2 97%   BMI 27.22 kg/m   Physical Exam Constitutional:      General: She is not in acute distress.    Appearance: Normal appearance.  HENT:     Head: Normocephalic and atraumatic.  Cardiovascular:     Rate and Rhythm: Normal rate and regular rhythm.     Pulses: Normal pulses.     Heart sounds: No murmur heard.    No friction rub. No gallop.  Pulmonary:     Effort: Pulmonary effort is normal. No respiratory distress.     Breath sounds: No wheezing, rhonchi or rales.  Abdominal:     General: Bowel sounds are normal.     Palpations: Abdomen is soft. There is no mass.     Tenderness: There is no abdominal tenderness. There is no guarding or rebound.     Comments: No tenderness to light or deep palpation, no palpable masses.  No evident fluid shift on exam today.  Skin:    General: Skin is warm and dry.  Neurological:     Mental Status: She is alert and oriented to person, place, and time.      Assessment & Plan:  Bloating -     CA 125 -     CBC with Differential/Platelet; Future -     Comprehensive metabolic  panel; Future -     US PELVIC COMPLETE WITH TRANSVAGINAL; Future  Starting with lab work today, we also discussed ultrasound to rule out ovarian masses.  If initial workup is unremarkable, we will likely refer to gastroenterology.  Patient and daughter are agreeable to this plan.  Return as indicated by lab and imaging result's.  [name], PA

## 2022-08-03 ENCOUNTER — Encounter: Payer: Self-pay | Admitting: Hematology and Oncology

## 2022-08-03 LAB — CBC WITH DIFFERENTIAL/PLATELET
Basophils Absolute: 0.1 10*3/uL (ref 0.0–0.2)
Basos: 1 %
EOS (ABSOLUTE): 0.1 10*3/uL (ref 0.0–0.4)
Eos: 2 %
Hematocrit: 37.7 % (ref 34.0–46.6)
Hemoglobin: 12.5 g/dL (ref 11.1–15.9)
Immature Grans (Abs): 0 10*3/uL (ref 0.0–0.1)
Immature Granulocytes: 0 %
Lymphocytes Absolute: 1 10*3/uL (ref 0.7–3.1)
Lymphs: 16 %
MCH: 30.6 pg (ref 26.6–33.0)
MCHC: 33.2 g/dL (ref 31.5–35.7)
MCV: 92 fL (ref 79–97)
Monocytes Absolute: 0.8 10*3/uL (ref 0.1–0.9)
Monocytes: 12 %
Neutrophils Absolute: 4.5 10*3/uL (ref 1.4–7.0)
Neutrophils: 69 %
Platelets: 195 10*3/uL (ref 150–450)
RBC: 4.08 x10E6/uL (ref 3.77–5.28)
RDW: 12.8 % (ref 11.7–15.4)
WBC: 6.5 10*3/uL (ref 3.4–10.8)

## 2022-08-03 LAB — COMPREHENSIVE METABOLIC PANEL
ALT: 10 IU/L (ref 0–32)
AST: 24 IU/L (ref 0–40)
Albumin/Globulin Ratio: 2
Albumin: 4.4 g/dL (ref 3.7–4.7)
Alkaline Phosphatase: 108 IU/L (ref 44–121)
BUN/Creatinine Ratio: 27 (ref 12–28)
BUN: 23 mg/dL (ref 8–27)
Bilirubin Total: 0.4 mg/dL (ref 0.0–1.2)
CO2: 23 mmol/L (ref 20–29)
Calcium: 10.7 mg/dL — ABNORMAL HIGH (ref 8.7–10.3)
Chloride: 106 mmol/L (ref 96–106)
Creatinine, Ser: 0.84 mg/dL (ref 0.57–1.00)
Globulin, Total: 2.2 g/dL (ref 1.5–4.5)
Glucose: 135 mg/dL — ABNORMAL HIGH (ref 70–99)
Potassium: 4.4 mmol/L (ref 3.5–5.2)
Sodium: 142 mmol/L (ref 134–144)
Total Protein: 6.6 g/dL (ref 6.0–8.5)
eGFR: 68 mL/min/{1.73_m2} (ref >59)

## 2022-08-03 LAB — CA 125: Cancer Antigen (CA) 125: 7 U/mL (ref 0.0–38.1)

## 2022-08-04 ENCOUNTER — Encounter: Payer: Medicare PPO | Admitting: Physical Therapy

## 2022-08-07 ENCOUNTER — Other Ambulatory Visit: Payer: Self-pay | Admitting: Nurse Practitioner

## 2022-08-07 ENCOUNTER — Ambulatory Visit: Payer: Medicare PPO

## 2022-08-07 DIAGNOSIS — I890 Lymphedema, not elsewhere classified: Secondary | ICD-10-CM

## 2022-08-07 DIAGNOSIS — Z483 Aftercare following surgery for neoplasm: Secondary | ICD-10-CM

## 2022-08-07 DIAGNOSIS — R293 Abnormal posture: Secondary | ICD-10-CM

## 2022-08-07 DIAGNOSIS — Z170 Estrogen receptor positive status [ER+]: Secondary | ICD-10-CM | POA: Diagnosis not present

## 2022-08-07 DIAGNOSIS — M25612 Stiffness of left shoulder, not elsewhere classified: Secondary | ICD-10-CM

## 2022-08-07 DIAGNOSIS — K219 Gastro-esophageal reflux disease without esophagitis: Secondary | ICD-10-CM

## 2022-08-07 DIAGNOSIS — R12 Heartburn: Secondary | ICD-10-CM

## 2022-08-07 DIAGNOSIS — C50312 Malignant neoplasm of lower-inner quadrant of left female breast: Secondary | ICD-10-CM | POA: Diagnosis not present

## 2022-08-07 NOTE — Therapy (Signed)
OUTPATIENT PHYSICAL THERAPY  UPPER EXTREMITY ONCOLOGY TREATMENT  Patient Name: Jamie [name] MRN: [id number] DOB:31-Dec-1934, 88 y.o., female Today's Date: 08/07/2022  END OF SESSION:  PT End of Session - 08/07/22 0958     Visit Number 15    Number of Visits 19    Date for PT Re-Evaluation 08/18/22    PT Start Time 1000    PT Stop Time 1100    PT Time Calculation (min) 60 min    Activity Tolerance Patient tolerated treatment well    Behavior During Therapy [facility] for tasks assessed/performed               Past Medical History:  Diagnosis Date   Abnormality of gait 04/18/2017   Degenerative joint disease of knee, right 03/19/2018   Injected April 18, 2018 Approved for Durolane R knee 3.4.20. Patient given another steroid injection Jun 24, 2018.  Repeat injection September 09, 2018 repeat injection February 27, 2019 repeat 6/110/20221 bilateral injections given December 01, 2019 Right knee injected April 20, 2020 monovisc May 19, 2018 Repeat steroid injection given June 16, 2020 repeat injection August 24, 2020   Essential hypertension 08/14/2016   Generalized anxiety disorder 06/27/2017   History of dizziness 06/27/2017   History of hysterectomy for benign disease 06/27/2017   History of vitamin D deficiency 08/22/2017   Leukocytes in urine 08/14/2016   Lumbar facet arthropathy 06/27/2017   Major neurocognitive disorder due to multiple etiologies 09/29/2020   Mixed hyperlipidemia 08/22/2017   Osteoarthritis of knee 09/18/2017   Pancolitis 08/14/2016   Parkinson's disease 03/13/2017   Prediabetes 08/22/2017   Right knee pain    Spinal stenosis of lumbar region    Weakness of both hips 04/18/2017   White coat syndrome without diagnosis of hypertension 08/22/2017   Past Surgical History:  Procedure Laterality Date   HYSTERECTOMY ABDOMINAL WITH SALPINGECTOMY     MASTECTOMY W/ SENTINEL NODE BIOPSY Left 08/18/2021   Procedure: LEFT MASTECTOMY WITH TARGETED LYMPH NODE DISSECTION;   Surgeon: Abigail Miyamoto, MD;  Location: [facility];  Service: General;  Laterality: Left;   TONSILLECTOMY     Patient Active Problem List   Diagnosis Date Noted   Gastroesophageal reflux disease without esophagitis 03/19/2022   Heartburn 03/19/2022   S/P left mastectomy 08/18/2021   Genetic testing 07/25/2021   Family history of breast cancer 07/14/2021   Malignant neoplasm of lower-inner quadrant of left breast in female, estrogen receptor positive (HCC) 07/11/2021   Mild episode of recurrent major depressive disorder (HCC) 04/06/2021   Age-related osteoporosis without current pathological fracture 04/06/2021   Greater trochanteric bursitis of right hip 01/21/2021   Major neurocognitive disorder due to multiple etiologies 09/29/2020   Right knee pain 05/07/2018   Osteoarthritis of knees, bilateral 03/19/2018   Osteoarthritis of knee 09/18/2017   Mixed hyperlipidemia 08/22/2017   Prediabetes 08/22/2017   White coat syndrome without diagnosis of hypertension 08/22/2017   History of vitamin D deficiency 08/22/2017   Lumbar facet arthropathy 06/27/2017   History of hysterectomy for benign disease 06/27/2017   Generalized anxiety disorder 06/27/2017   History of dizziness 06/27/2017   Other fatigue 06/27/2017   Overweight (BMI 25.0-29.9) 06/27/2017   Weakness of both hips 04/18/2017   Abnormality of gait 04/18/2017   Spinal stenosis of lumbar region 04/18/2017   Parkinson's disease 03/13/2017   Pancolitis 08/14/2016   Essential hypertension 08/14/2016   Leukocytes in urine 08/14/2016       REFERRING PROVIDER: Serena Croissant MD  REFERRING DIAG: Left UE lymphedema  THERAPY DIAG:  Lymphedema, not elsewhere classified  Aftercare following surgery for neoplasm  Stiffness of left shoulder, not elsewhere classified  Malignant neoplasm of lower-inner quadrant of left breast in female, estrogen receptor positive (HCC)  Abnormal posture  ONSET DATE:  06/12/2022  Rationale for Evaluation and Treatment: Rehabilitation  SUBJECTIVE:                                                                                                                                                                                           SUBJECTIVE STATEMENT:  I slept better this weekend. The chest is tender with the massage, but is not painful at other times. Grand daughter reports they left foam off her arm because she is just too hot with the sports bra and foam.  PERTINENT HISTORY:  Patient was diagnosed on 06/27/2021 with left grade 2 invasive ductal carcinoma breast cancer. She underwent a left mastectomy and targeted axillary lymph node dissection (7/7 nodes positive) on 08/18/2021. It is triple positive with a Ki67 of 40%. She had radiation. Patient has cognitive deficits effecting memory and was diagnosed with Parkinson's in 2020   PAIN:  Are you having pain? No chest is just tender to touch  PRECAUTIONS: Left UE lymphedema, Parkinsons, neuro cognitive disorder, OA , spinal stenosis, GAD  WEIGHT BEARING RESTRICTIONS: No  FALLS:  Has patient fallen in last 6 months? No  LIVING ENVIRONMENT: Lives with: lives alone, but has a family member with her every night, and they cook dinner for her Lives in: House/apartment Stairs: Yes; External: 4 steps; can reach both Has following equipment at home: Single point cane, Walker - 2 wheeled, and Grab bars  OCCUPATION: NA  LEISURE: read,  HAND DOMINANCE: right   PRIOR LEVEL OF FUNCTION: Independent with basic ADLs  PATIENT GOALS: Learn how to manage swelling   OBJECTIVE:  COGNITION: Overall cognitive status: Impaired   PALPATION: Tender left chest and left lateral trunk  OBSERVATIONS / OTHER ASSESSMENTS: mild redness left prox arm, swelling visible and palpable at left lateral trunk  SENSATION: Light touch: Appears intact    POSTURE: forward head, rounded shoulders  UPPER EXTREMITY  AROM/PROM:  A/PROM RIGHT   eval   Shoulder extension   Shoulder flexion 127  Shoulder abduction 124  Shoulder internal rotation   Shoulder external rotation     (Blank rows = not tested)  A/PROM LEFT   eval Left 07/05/22  Shoulder extension    Shoulder flexion 126 pulls at ribs 130 pulls at ribs, 138 after P/ROM  Shoulder abduction 115 118, 125 after P/ROM  Shoulder internal rotation  Shoulder external rotation      (Blank rows = not tested)  CERVICAL AROM: All within functional limits:  UPPER EXTREMITY STRENGTH:   LYMPHEDEMA ASSESSMENTS:  SURGERY TYPE/DATE: 08/18/21 Left Mastectomy with NUMBER OF LYMPH NODES REMOVED: 7/7 CHEMOTHERAPY: NO RADIATION:Yes, completed 11/09/2021 HORMONE TREATMENT: Letrozole INFECTIONS: NO Herceptin infusions will end May 14  LYMPHEDEMA ASSESSMENTS:   LANDMARK RIGHT  eval RIGHT 07/21/2022 Right 08/01/22  At axilla  34.5 33.4 34.1  15 cm proximal to olecranon process 33.8 32.9   10 cm proximal to olecranon process 31.5 31.5 31.3  Olecranon process 26.5 26.0 26.2  15 cm proximal to ulnar styloid process 25.4 25.4   10 cm proximal to ulnar styloid process 22.8 23.3 22.7  Just proximal to ulnar styloid process 16 16.05 16.1  Across hand at thumb web space 18.1 18.1 17.4  At base of 2nd digit 6.0 6.0 6.0  (Blank rows = not tested)  LANDMARK LEFT  eval LEFT 07/21/22 LEFT 07/24/22 LEFT 08/01/2022  At axilla  33.8 32.8 33 32  15 cm proximal to olecranon process 32.4 31.8 30.3 31.0  10 cm proximal to olecranon process 31.8 31.1 30.4 31.8  Olecranon process 25.8 24.6 25 25.1  15 cm proximal to ulnar styloid process 25 24.8 25.3 25  10  cm proximal to ulnar styloid process 22.4 22.7 23 22.7  Just proximal to ulnar styloid process 16.1 16.1 15.6 15.9  Across hand at thumb web space 18.1 17.9 17.4 17.7  At base of 2nd digit 6.0 6.0 5.9 5.8  (Blank rows = not tested)   L-DEX LYMPHEDEMA SCREENING: The patient was assessed using the L-Dex  machine today to produce a lymphedema index baseline score. The patient will be reassessed on a regular basis (typically every 3 months) to obtain new L-Dex scores. If the score is > 6.5 points away from his/her baseline score indicating onset of subclinical lymphedema, it will be recommended to wear a compression garment for 4 weeks, 12 hours per day and then be reassessed. If the score continues to be > 6.5 points from baseline at reassessment, we will initiate lymphedema treatment. Assessing in this manner has a 95% rate of preventing clinically significant lymphedema.  LLIS: 6  TODAY'S TREATMENT:   08/07/2022 Removed bandages and pts grand daughter helped her wash her arm. PROM left shoulder to restore mobility MLD to Lt UE in supine as follows: Short neck, 5 diaphragmatic breaths, Lt inguinal and Rt axillary nodes,  Lt axillo-inguinal and anterior inter-axillary anastomosis, then focused on Lt UE working from proximal to distal then retracing all steps Compression Bandaging to Lt UE as follows: Cocoa butter, TG soft, Elastomull to fingers 1-4, Artiflex x1 from wrist to axilla  left out foam at pt request, 1 6 cm to hand, 1-8 cm from wrist and  "X" at elbow, then 1-10 cm short stretch bandages from wrist to axilla   08/01/2022 Removed bandages and pts grand daughter helped her wash her arm. Checked sports bra and it does cover the lateral trunk area of swelling. Foam pad made for lower border of bra to prevent sliding Pt measured for chart notes and garments and spoke with caregivers about ordering on line to get her into garments sooner MLD to Lt UE in supine as follows: Short neck, 5 diaphragmatic breaths, Lt inguinal and Rt axillary nodes,  Lt axillo-inguinal and anterior inter-axillary anastomosis, then focused on Lt UE working from proximal to distal then retracing all steps Compression Bandaging to Lt UE  as follows: Cocoa butter, TG soft, Elastomull to fingers 1-4, Artiflex x1 from wrist to  axilla with adding 1/4" gray compression foam to forearm, 1-6 cm to hand with adding 1/2" gray compression foam to dorsal hand/wrist , 1-8 cm from wrist and  "X" at elbow, then 1-10 cm short stretch bandages from wrist to axilla.  Pt fits into [employer] SECURE sleeve size M1 short, and size Small gauntlet; texted her grand daughter to have her order.   07/26/22: Removed bandages that [name] had donned for pt yesterday assessing technique while removing. Minor correction for hand bandage to have more of pattern on hand instead of wrist.  MLD to Lt UE in supine as follows: Short neck, 5 diaphragmatic breaths, Lt inguinal and Rt axillary nodes,  Lt axillo-inguinal and anterior inter-axillary anastomosis, then focused on Lt UE working from proximal to distal then retracing all steps Compression Bandaging to Lt UE as follows: Cocoa butter, TG soft, Elastomull to fingers 1-4, Artiflex x1 from wrist to axilla with adding 1/4" gray compression foam to forearm, 1-6 cm to hand with adding 1/2" gray compression foam to dorsal hand/wrist and instructing [name] while performing and reviewing correct technique, 1-8 cm from wrist and "X" at elbow, then 1-10 cm short stretch bandages from wrist to axilla.   07/24/22: Manual Therapy Removed bandages that [name] had donned for pt yesterday assessing technique while removing. She did very well with tension of bandages and spacing. She just required cuing on correct pattern for hand bandage but overall was a well constructed and safe bandage.  Circumference Measurements retaken, good reductions noted throughout except for forearm and elbow slight increase.  MLD to Lt UE in supine as follows: Short neck, 5 diaphragmatic breaths, Lt inguinal and Rt axillary nodes,  Lt axillo-inguinal and anterior inter-axillary anastomosis, then focused on Lt UE working from proximal to distal then retracing all steps Compression Bandaging to Lt UE as follows: Cocoa butter, TG soft, Elastomull to fingers  1-4, Artiflex x1 from wrist to axilla with adding 1/4" gray compression foam to forearm, 1-6 cm to hand with adding 1/2" gray compression foam to dorsal hand/wrist and instructing [name] while performing and reviewing correct technique, 1-8 cm from wrist and "X" at elbow, then 1-10 cm short stretch bandages from wrist to axilla.   07/21/2022 Pts son and grand daughter in attendance Wraps removed and Pts bilateral arm circumference measured Pts. Grand daughter performed Compression Bandaging to Lt UE as follows: Cocoa butter (applied by PT), TG soft, Elastomull to fingers 1-4 under TG soft, Artiflex x1 from wrist to axilla, 1-6 cm to hand, 1-8 cm from wrist and "X" at elbow, then 1-10 cm short stretch bandages from wrist to axilla. Pts grandaughter did all wrapping with only occasional VC's and demonstration byt therapist. Pts grandaughter showed pt how to exercise to loosen her wraps. Reminded all that wraps need to be removed if she is having numbness, pain or any discomfort.  PATIENT EDUCATION:  Education details: POC, try compression bra for trunk edema, wash sleeve every 2 days at a minimum Person educated: Patient and granddaughter [name] Education method: Explanation Education comprehension: verbalized understanding  HOME EXERCISE PROGRAM: Self MLD, post op exercises,   ASSESSMENT:  CLINICAL IMPRESSION: Pts garments not yet received. Continued MLD and bandaging but left out foam at [facility] daughter/pts request. Will continue CDT with family doing most until garments arrive. When have garments will show how to don with slip eze.  OBJECTIVE IMPAIRMENTS: decreased activity tolerance, decreased cognition, decreased ROM, increased edema, impaired UE functional use, postural dysfunction, and pain.   ACTIVITY LIMITATIONS: carrying, lifting, bathing, and reach over  head  PARTICIPATION LIMITATIONS: meal prep, medication management, personal finances, driving, and community activity  PERSONAL FACTORS:Left Breast Cancer s/p radiation, Parkinsons, neuro cognitive disorder, OA , spinal stenosis, GAD are also affecting patient's functional outcome.   REHAB POTENTIAL: Good  CLINICAL DECISION MAKING: Stable/uncomplicated  EVALUATION COMPLEXITY: Low  GOALS: Goals reviewed with patient? Yes  SHORT TERM GOALS: Target date: 07/21/2022  Pt/family member will be educated in MLD for left UE lymphedema Baseline: Goal status: MET  2.  Pt will have left shoulder ROM equal to right for flexion and abduction Baseline:  Goal status: Ongoing  3.  Pt will have decreased tenderness left chest and trunk area Baseline:  Goal status: Ongoing 4.  Pt will get benefit from compression bra for lateral trunk swelling Baseline:  Goal status:MET;purchased sports bra  5. Pt will be tolerant of compression bandaging  Gaol Status: MET 6/10  6. Pt will be fit for appropriate Compression garment for Left UE lymphedema.  GOAL STATUS :NEW    PLAN:  PT FREQUENCY: 2x/week  PT DURATION: 4 weeks  PLANNED INTERVENTIONS: Therapeutic exercises, Patient/Family education, Self Care, Orthotic/Fit training, Manual lymph drainage, Compression bandaging, scar mobilization, Manual therapy, and Re-evaluation  PLAN FOR NEXT SESSION:  SOZO screen, Hear back from [facility] yet? work towards independence with compression bandaging  ; AAROM supine, instruct family member in MLD to left UE and left lateral trunk, STM to left pectorals/UT prn, PROM. Awaiting compression sleeve/gauntlet 07/24/22: Demographics sent to [facility] for compression sleeve insurance verification  Waynette Buttery, PT 08/07/2022, 1:08 PM

## 2022-08-07 NOTE — Progress Notes (Unsigned)
[name] [address] [place] [address] Phone: [phone] Subjective:   [name], am serving as a scribe for Dr. Antoine Primas.  I'm seeing this patient by the request  of:  [name], PA  CC: Knee pain follow-up  VOZ:[id number]  05/30/2022 Degenerative disc disease significant arthritic changes. Patient does have the abnormality of the gait and the spinal stenosis that can cause some weakness as well. Discussed with patient about icing regimen and home exercises. Patient is with family member and we discussed which activities to do. Can continue to repeat injections every 10 weeks if needed. Secondary to patient's age likely not a surgical candidate. Follow-up again in 10 weeks   Updated 08/08/2022 SHONIQUE DISIMONE is a 88 y.o. female coming in with complaint of B knee pain. Pain seems to have become worse since last visit.   Patient c/o pain in L hip over piriformis. Pain with walking.   Reviewing patient's notes has been going to formal physical therapy for quite some time for the Parkinson's disease and balance and coordination.     Past Medical History:  Diagnosis Date   Abnormality of gait 04/18/2017   Degenerative joint disease of knee, right 03/19/2018   Injected April 18, 2018 Approved for Durolane R knee 3.4.20. Patient given another steroid injection Jun 24, 2018.  Repeat injection September 09, 2018 repeat injection February 27, 2019 repeat 6/110/20221 bilateral injections given December 01, 2019 Right knee injected April 20, 2020 monovisc May 19, 2018 Repeat steroid injection given June 16, 2020 repeat injection August 24, 2020   Essential hypertension 08/14/2016   Generalized anxiety disorder 06/27/2017   History of dizziness 06/27/2017   History of hysterectomy for benign disease 06/27/2017   History of vitamin D deficiency 08/22/2017   Leukocytes in urine 08/14/2016   Lumbar facet arthropathy 06/27/2017   Major  neurocognitive disorder due to multiple etiologies 09/29/2020   Mixed hyperlipidemia 08/22/2017   Osteoarthritis of knee 09/18/2017   Pancolitis 08/14/2016   Parkinson's disease 03/13/2017   Prediabetes 08/22/2017   Right knee pain    Spinal stenosis of lumbar region    Weakness of both hips 04/18/2017   White coat syndrome without diagnosis of hypertension 08/22/2017   Past Surgical History:  Procedure Laterality Date   HYSTERECTOMY ABDOMINAL WITH SALPINGECTOMY     MASTECTOMY W/ SENTINEL NODE BIOPSY Left 08/18/2021   Procedure: LEFT MASTECTOMY WITH TARGETED LYMPH NODE DISSECTION;  Surgeon: Abigail Miyamoto, MD;  Location: [facility];  Service: General;  Laterality: Left;   TONSILLECTOMY     Social History   Socioeconomic History   Marital status: Widowed    Spouse name: Not on file   Number of children: 3   Years of education: 12   Highest education level: High school graduate  Occupational History   Not on file  Tobacco Use   Smoking status: Never    Passive exposure: Never   Smokeless tobacco: Never  Vaping Use   Vaping Use: Never used  Substance and Sexual Activity   Alcohol use: No    Alcohol/week: 0.0 standard drinks of alcohol   Drug use: No   Sexual activity: Not Currently    Birth control/protection: Surgical  Other Topics Concern   Not on file  Social History Narrative   Pt lives alone in her 1 story home- she has 3 sons, and a high school graduate. She is right handed, she drinks coffee, and one  soda a week mostly water   Social Determinants of Health   Financial Resource Strain: Low Risk  (07/13/2021)   Overall Financial Resource Strain (CARDIA)    Difficulty of Paying Living Expenses: Not hard at all  Food Insecurity: No Food Insecurity (07/13/2021)   Hunger Vital Sign    Worried About Running Out of Food in the Last Year: Never true    Ran Out of Food in the Last Year: Never true  Transportation Needs: No Transportation Needs  (07/13/2021)   PRAPARE - Administrator, Civil Service (Medical): No    Lack of Transportation (Non-Medical): No  Physical Activity: Not on file  Stress: No Stress Concern Present (08/16/2021)   [employer] of Occupational Health - Occupational Stress Questionnaire    Feeling of Stress : Not at all  Social Connections: Moderately Integrated (08/16/2021)   Social Connection and Isolation Panel [NHANES]    Frequency of Communication with Friends and Family: More than three times a week    Frequency of Social Gatherings with Friends and Family: More than three times a week    Attends Religious Services: More than 4 times per year    Active Member of [employer] or Organizations: Yes    Attends Banker Meetings: More than 4 times per year    Marital Status: Widowed   Allergies  Allergen Reactions   Flagyl [Metronidazole] Rash    Possible cause of rash    Family History  Problem Relation Age of Onset   Breast cancer Mother 70   Melanoma Father        dx. 40s   Lung cancer Father        dx. 44s   Healthy Son      Current Outpatient Medications (Cardiovascular):    rosuvastatin (CRESTOR) 5 MG tablet, Take 1 tablet (5 mg total) by mouth at bedtime.    Current Outpatient Medications (Hematological):    cyanocobalamin (VITAMIN B12) 500 MCG tablet,   Current Outpatient Medications (Other):    Biotin 1 MG CAPS,    carbidopa-levodopa (SINEMET CR) 50-200 MG tablet, Take 1 tablet by mouth at bedtime.   carbidopa-levodopa (SINEMET IR) 25-100 MG tablet, Take 2 tablets by mouth 3 (three) times daily. (Patient taking differently: Take 2 tablets by mouth 3 (three) times daily. Take 2/ 2/ 1)   cholecalciferol (VITAMIN D3) 25 MCG (1000 UNIT) tablet, Take 2,000 Units by mouth daily.   donepezil (ARICEPT) 10 MG tablet, Take 1 tablet (10 mg total) by mouth at bedtime.   letrozole (FEMARA) 2.5 MG tablet, Take 1 tablet (2.5 mg total) by mouth daily.   mirtazapine (REMERON)  15 MG tablet, Take 1 tablet (15 mg total) by mouth at bedtime.   Multiple Vitamins-Minerals (PRESERVISION AREDS 2 PO), Take 1 tablet by mouth in the morning and at bedtime.    NON FORMULARY, Herblax as needed   NON FORMULARY, Cholesterol Reduction   omeprazole (PRILOSEC) 20 MG capsule, TAKE 1 CAPSULE BY MOUTH DAILY AT 5:30 PM   Vitamin E 100 units TABS, Take 250 mg by mouth. Once a day   Reviewed prior external information including notes and imaging from  primary care provider As well as notes that were available from care everywhere and other healthcare systems.  Past medical history, social, surgical and family history all reviewed in electronic medical record.  No pertanent information unless stated regarding to the chief complaint.   Review of Systems:  No headache, visual changes, nausea, vomiting,  diarrhea, constipation, dizziness, abdominal pain, skin rash, fevers, chills, night sweats, weight loss, swollen lymph nodes, body aches, joint swelling, chest pain, shortness of breath, mood changes. POSITIVE muscle aches  Objective  Blood pressure 118/82, pulse 80, height 5\' 8"  (1.727 m), weight 178 lb (80.7 kg), SpO2 97 %.   General: No apparent distress alert and oriented x3 mood and affect normal, dressed appropriately.  Masked facies HEENT: Pupils equal, extraocular movements intact  Respiratory: Patient's speak in full sentences and does not appear short of breath  Cardiovascular: No lower extremity edema, non tender, no erythema  Knee exam shows crepitus noted.  No instability bilaterally.  The patient does have a shuffling gait noted. Severely tender to palpation over the left greater trochanteric area.  After informed written and verbal consent, patient was seated on exam table. Right knee was prepped with alcohol swab and utilizing anterolateral approach, patient's right knee space was injected with 4:1  marcaine 0.5%: Kenalog 40mg /dL. Patient tolerated the procedure well  without immediate complications.  After informed written and verbal consent, patient was seated on exam table. Left knee was prepped with alcohol swab and utilizing anterolateral approach, patient's left knee space was injected with 4:1  marcaine 0.5%: Kenalog 40mg /dL. Patient tolerated the procedure well without immediate complications.   After verbal consent patient was prepped with alcohol swab and with a 21-gauge 2 inch needle injected into the left greater trochanteric area with 2 cc of 0.5% Marcaine and 1 cc of Kenalog 40 mg/mL.  No blood loss.  Band-Aid placed.  Postinjection instructions given    Impression and Recommendations:    The above documentation has been reviewed and is accurate and complete Judi Saa, DO

## 2022-08-08 ENCOUNTER — Ambulatory Visit: Payer: Medicare PPO | Admitting: Family Medicine

## 2022-08-08 VITALS — BP 118/82 | HR 80 | Ht 68.0 in | Wt 178.0 lb

## 2022-08-08 DIAGNOSIS — M7062 Trochanteric bursitis, left hip: Secondary | ICD-10-CM

## 2022-08-08 DIAGNOSIS — M170 Bilateral primary osteoarthritis of knee: Secondary | ICD-10-CM

## 2022-08-08 DIAGNOSIS — G20B1 Parkinson's disease with dyskinesia, without mention of fluctuations: Secondary | ICD-10-CM | POA: Diagnosis not present

## 2022-08-08 NOTE — Patient Instructions (Signed)
See me again in 3 months.

## 2022-08-08 NOTE — Assessment & Plan Note (Signed)
Worsening symptoms on the left side.  Has had this in the past.  Daily activities.  Discussed which activities to do and which ones to avoid.  Increase activity slowly.  Follow-up again in 6-8

## 2022-08-08 NOTE — Assessment & Plan Note (Signed)
Chronic problem with worsening symptoms.  Patient is having more difficulty and is going to be going to formal physical therapy which I think will be beneficial for the balance and coordination.  Discussed which activities to do and which ones to avoid.  Discussed with patient about icing regimen and home exercises.  Discussed continuing to stay active which will be beneficial.  Can do viscosupplementation if needed.  Follow-up with me again in 3 months otherwise.

## 2022-08-08 NOTE — Assessment & Plan Note (Signed)
Patient does have more rigidity noted.  More shuffling gait.  More masked facies.  Does seem to be progressing somewhat more than last time I saw patient.

## 2022-08-09 ENCOUNTER — Ambulatory Visit: Payer: Medicare PPO

## 2022-08-14 ENCOUNTER — Ambulatory Visit: Payer: Medicare PPO

## 2022-08-14 DIAGNOSIS — R293 Abnormal posture: Secondary | ICD-10-CM

## 2022-08-14 DIAGNOSIS — Z483 Aftercare following surgery for neoplasm: Secondary | ICD-10-CM

## 2022-08-14 DIAGNOSIS — Z170 Estrogen receptor positive status [ER+]: Secondary | ICD-10-CM

## 2022-08-14 DIAGNOSIS — I890 Lymphedema, not elsewhere classified: Secondary | ICD-10-CM

## 2022-08-14 DIAGNOSIS — M25612 Stiffness of left shoulder, not elsewhere classified: Secondary | ICD-10-CM | POA: Diagnosis not present

## 2022-08-14 DIAGNOSIS — C50312 Malignant neoplasm of lower-inner quadrant of left female breast: Secondary | ICD-10-CM | POA: Diagnosis not present

## 2022-08-14 NOTE — Therapy (Signed)
OUTPATIENT PHYSICAL THERAPY  UPPER EXTREMITY ONCOLOGY TREATMENT  Patient Name: Jamie [name] MRN: [id number] DOB:10-Dec-1934, 88 y.o., female Today's Date: 08/14/2022  END OF SESSION:  PT End of Session - 08/14/22 1503     Visit Number 16    Number of Visits 19    Date for PT Re-Evaluation 10/09/22    PT Start Time 1505    PT Stop Time 1542    PT Time Calculation (min) 37 min    Activity Tolerance Patient tolerated treatment well    Behavior During Therapy Jamie [name] for tasks assessed/performed               Past Medical History:  Diagnosis Date   Abnormality of gait 04/18/2017   Degenerative joint disease of knee, right 03/19/2018   Injected April 18, 2018 Approved for Durolane R knee 3.4.20. Patient given another steroid injection Jun 24, 2018.  Repeat injection September 09, 2018 repeat injection February 27, 2019 repeat 6/110/20221 bilateral injections given December 01, 2019 Right knee injected April 20, 2020 monovisc May 19, 2018 Repeat steroid injection given June 16, 2020 repeat injection August 24, 2020   Essential hypertension 08/14/2016   Generalized anxiety disorder 06/27/2017   History of dizziness 06/27/2017   History of hysterectomy for benign disease 06/27/2017   History of vitamin D deficiency 08/22/2017   Leukocytes in urine 08/14/2016   Lumbar facet arthropathy 06/27/2017   Major neurocognitive disorder due to multiple etiologies 09/29/2020   Mixed hyperlipidemia 08/22/2017   Osteoarthritis of knee 09/18/2017   Pancolitis 08/14/2016   Parkinson's disease 03/13/2017   Prediabetes 08/22/2017   Right knee pain    Spinal stenosis of lumbar region    Weakness of both hips 04/18/2017   White coat syndrome without diagnosis of hypertension 08/22/2017   Past Surgical History:  Procedure Laterality Date   HYSTERECTOMY ABDOMINAL WITH SALPINGECTOMY     MASTECTOMY W/ SENTINEL NODE BIOPSY Left 08/18/2021   Procedure: LEFT MASTECTOMY WITH TARGETED LYMPH NODE DISSECTION;   Surgeon: Jamie Miyamoto, MD;  Location: [facility];  Service: General;  Laterality: Left;   TONSILLECTOMY     Patient Active Problem List   Diagnosis Date Noted   Gastroesophageal reflux disease without esophagitis 03/19/2022   Heartburn 03/19/2022   S/P left mastectomy 08/18/2021   Genetic testing 07/25/2021   Family history of breast cancer 07/14/2021   Malignant neoplasm of lower-inner quadrant of left breast in female, estrogen receptor positive (HCC) 07/11/2021   Mild episode of recurrent major depressive disorder (HCC) 04/06/2021   Age-related osteoporosis without current pathological fracture 04/06/2021   Greater trochanteric bursitis, left 01/21/2021   Major neurocognitive disorder due to multiple etiologies 09/29/2020   Right knee pain 05/07/2018   Osteoarthritis of knees, bilateral 03/19/2018   Osteoarthritis of knee 09/18/2017   Mixed hyperlipidemia 08/22/2017   Prediabetes 08/22/2017   White coat syndrome without diagnosis of hypertension 08/22/2017   History of vitamin D deficiency 08/22/2017   Lumbar facet arthropathy 06/27/2017   History of hysterectomy for benign disease 06/27/2017   Generalized anxiety disorder 06/27/2017   History of dizziness 06/27/2017   Other fatigue 06/27/2017   Overweight (BMI 25.0-29.9) 06/27/2017   Weakness of both hips 04/18/2017   Abnormality of gait 04/18/2017   Spinal stenosis of lumbar region 04/18/2017   Parkinson's disease 03/13/2017   Pancolitis 08/14/2016   Essential hypertension 08/14/2016   Leukocytes in urine 08/14/2016       REFERRING PROVIDER: Serena Croissant MD  REFERRING DIAG:  Left UE lymphedema  THERAPY DIAG:  Lymphedema, not elsewhere classified  Aftercare following surgery for neoplasm  Stiffness of left shoulder, not elsewhere classified  Malignant neoplasm of lower-inner quadrant of left breast in female, estrogen receptor positive (HCC)  Abnormal posture  ONSET DATE:  06/12/2022  Rationale for Evaluation and Treatment: Rehabilitation  SUBJECTIVE:                                                                                                                                                                                           SUBJECTIVE STATEMENT:  Pts granddaughter Jamie [name] reports sleeve came in last Thursday, but they have not tried it yet. She has been wrapped daily, but she had a shower with the caregiver earlier, today and they gave her a reprieve since she was coming for therapy.  PERTINENT HISTORY:  Patient was diagnosed on 06/27/2021 with left grade 2 invasive ductal carcinoma breast cancer. She underwent a left mastectomy and targeted axillary lymph node dissection (7/7 nodes positive) on 08/18/2021. It is triple positive with a Ki67 of 40%. She had radiation. Patient has cognitive deficits effecting memory and was diagnosed with Parkinson's in 2020   PAIN:  Are you having pain? No chest is just tender to touch  PRECAUTIONS: Left UE lymphedema, Parkinsons, neuro cognitive disorder, OA , spinal stenosis, GAD  WEIGHT BEARING RESTRICTIONS: No  FALLS:  Has patient fallen in last 6 months? No  LIVING ENVIRONMENT: Lives with: lives alone, but has a family member with her every night, and they cook dinner for her Lives in: House/apartment Stairs: Yes; External: 4 steps; can reach both Has following equipment at home: Single point cane, Walker - 2 wheeled, and Grab bars  OCCUPATION: NA  LEISURE: read,  HAND DOMINANCE: right   PRIOR LEVEL OF FUNCTION: Independent with basic ADLs  PATIENT GOALS: Learn how to manage swelling   OBJECTIVE:  COGNITION: Overall cognitive status: Impaired   PALPATION: Tender left chest and left lateral trunk  OBSERVATIONS / OTHER ASSESSMENTS: mild redness left prox arm, swelling visible and palpable at left lateral trunk  SENSATION: Light touch: Appears intact    POSTURE: forward head, rounded  shoulders  UPPER EXTREMITY AROM/PROM:  A/PROM RIGHT   eval   Shoulder extension   Shoulder flexion 127  Shoulder abduction 124  Shoulder internal rotation   Shoulder external rotation     (Blank rows = not tested)  A/PROM LEFT   eval Left 07/05/22  Shoulder extension    Shoulder flexion 126 pulls at ribs 130 pulls at ribs, 138 after P/ROM  Shoulder abduction 115 118, 125 after P/ROM  Shoulder internal  rotation    Shoulder external rotation      (Blank rows = not tested)  CERVICAL AROM: All within functional limits:  UPPER EXTREMITY STRENGTH:   LYMPHEDEMA ASSESSMENTS:  SURGERY TYPE/DATE: 08/18/21 Left Mastectomy with NUMBER OF LYMPH NODES REMOVED: 7/7 CHEMOTHERAPY: NO RADIATION:Yes, completed 11/09/2021 HORMONE TREATMENT: Letrozole INFECTIONS: NO Herceptin infusions will end May 14  LYMPHEDEMA ASSESSMENTS:   LANDMARK RIGHT  eval RIGHT 07/21/2022 Right 08/01/22  At axilla  34.5 33.4 34.1  15 cm proximal to olecranon process 33.8 32.9   10 cm proximal to olecranon process 31.5 31.5 31.3  Olecranon process 26.5 26.0 26.2  15 cm proximal to ulnar styloid process 25.4 25.4   10 cm proximal to ulnar styloid process 22.8 23.3 22.7  Just proximal to ulnar styloid process 16 16.05 16.1  Across hand at thumb web space 18.1 18.1 17.4  At base of 2nd digit 6.0 6.0 6.0  (Blank rows = not tested)  LANDMARK LEFT  eval LEFT 07/21/22 LEFT 07/24/22 LEFT 08/01/2022  At axilla  33.8 32.8 33 32  15 cm proximal to olecranon process 32.4 31.8 30.3 31.0  10 cm proximal to olecranon process 31.8 31.1 30.4 31.8  Olecranon process 25.8 24.6 25 25.1  15 cm proximal to ulnar styloid process 25 24.8 25.3 25  10  cm proximal to ulnar styloid process 22.4 22.7 23 22.7  Just proximal to ulnar styloid process 16.1 16.1 15.6 15.9  Across hand at thumb web space 18.1 17.9 17.4 17.7  At base of 2nd digit 6.0 6.0 5.9 5.8  (Blank rows = not tested)   L-DEX LYMPHEDEMA SCREENING: The patient was  assessed using the L-Dex machine today to produce a lymphedema index baseline score. The patient will be reassessed on a regular basis (typically every 3 months) to obtain new L-Dex scores. If the score is > 6.5 points away from his/her baseline score indicating onset of subclinical lymphedema, it will be recommended to wear a compression garment for 4 weeks, 12 hours per day and then be reassessed. If the score continues to be > 6.5 points from baseline at reassessment, we will initiate lymphedema treatment. Assessing in this manner has a 95% rate of preventing clinically significant lymphedema.  LLIS: 6  TODAY'S TREATMENT:   08/14/22 SOZO screen performed ; 16.4 above, but down slightly from previous screen Showed grand daughter and son how to use Arion EZ slide leg to don UE sleeve.  She did very well getting sleeve on well and able to pull slide out properly without sleeve falling down. Grand daughter then donned gauntlet. Sleeve and gauntlet seem to fit very well. Advised them to have pt wear several hours and to check fingers to be sure she is not swelling. Sleeve and gauntlet seem to fit well and are comfortable for pt. Discussed measuring pt 1x per week to track swelling using a spot in the forearm and a spot in the upper arm, but being consistent where it is measured.reminded them that sleeve and glove only meant to be worn during the day time, never at night.  If arm does swell she may need to be wrapped for a day or 2 before going back to the sleeve.Discussed laundering every few days to keep sleeve snug. Showed family members Juzo foot slipee and Arion leg Slip-EZ on internet Jamie [name] is considering showing her grand mothers caregiver how to perform MLD to free her up some. Will follow up with pt 1x in a month to reassess  08/07/2022 Removed bandages and pts grand daughter helped her wash her arm. PROM left shoulder to restore mobility MLD to Lt UE in supine as follows: Short neck, 5  diaphragmatic breaths, Lt inguinal and Rt axillary nodes,  Lt axillo-inguinal and anterior inter-axillary anastomosis, then focused on Lt UE working from proximal to distal then retracing all steps Compression Bandaging to Lt UE as follows: Cocoa butter, TG soft, Elastomull to fingers 1-4, Artiflex x1 from wrist to axilla  left out foam at pt request, 1 6 cm to hand, 1-8 cm from wrist and  "X" at elbow, then 1-10 cm short stretch bandages from wrist to axilla   08/01/2022 Removed bandages and pts grand daughter helped her wash her arm. Checked sports bra and it does cover the lateral trunk area of swelling. Foam pad made for lower border of bra to prevent sliding Pt measured for chart notes and garments and spoke with caregivers about ordering on line to get her into garments sooner MLD to Lt UE in supine as follows: Short neck, 5 diaphragmatic breaths, Lt inguinal and Rt axillary nodes,  Lt axillo-inguinal and anterior inter-axillary anastomosis, then focused on Lt UE working from proximal to distal then retracing all steps Compression Bandaging to Lt UE as follows: Cocoa butter, TG soft, Elastomull to fingers 1-4, Artiflex x1 from wrist to axilla with adding 1/4" gray compression foam to forearm, 1-6 cm to hand with adding 1/2" gray compression foam to dorsal hand/wrist , 1-8 cm from wrist and  "X" at elbow, then 1-10 cm short stretch bandages from wrist to axilla.  Pt fits into [employer] SECURE sleeve size M1 short, and size Small gauntlet; texted her grand daughter to have her order.   07/26/22: Removed bandages that Jamie [name] had donned for pt yesterday assessing technique while removing. Minor correction for hand bandage to have more of pattern on hand instead of wrist.  MLD to Lt UE in supine as follows: Short neck, 5 diaphragmatic breaths, Lt inguinal and Rt axillary nodes,  Lt axillo-inguinal and anterior inter-axillary anastomosis, then focused on Lt UE working from proximal to distal then retracing  all steps Compression Bandaging to Lt UE as follows: Cocoa butter, TG soft, Elastomull to fingers 1-4, Artiflex x1 from wrist to axilla with adding 1/4" gray compression foam to forearm, 1-6 cm to hand with adding 1/2" gray compression foam to dorsal hand/wrist and instructing Jamie [name] while performing and reviewing correct technique, 1-8 cm from wrist and "X" at elbow, then 1-10 cm short stretch bandages from wrist to axilla.   07/24/22: Manual Therapy Removed bandages that Jamie [name] had donned for pt yesterday assessing technique while removing. She did very well with tension of bandages and spacing. She just required cuing on correct pattern for hand bandage but overall was a well constructed and safe bandage.  Circumference Measurements retaken, good reductions noted throughout except for forearm and elbow slight increase.  MLD to Lt UE in supine as follows: Short neck, 5 diaphragmatic breaths, Lt inguinal and Rt axillary nodes,  Lt axillo-inguinal and anterior inter-axillary anastomosis, then focused on Lt UE working from proximal to distal then retracing all steps Compression Bandaging to Lt UE as follows: Cocoa butter, TG soft, Elastomull to fingers 1-4, Artiflex x1 from wrist to axilla with adding 1/4" gray compression foam to forearm, 1-6 cm to hand with adding 1/2" gray compression foam to dorsal hand/wrist and instructing Jamie [name] while performing and reviewing correct technique, 1-8 cm from wrist and "X" at elbow, then 1-10  cm short stretch bandages from wrist to axilla.   07/21/2022 Pts son and grand daughter in attendance Wraps removed and Pts bilateral arm circumference measured Pts. Grand daughter performed Compression Bandaging to Lt UE as follows: Cocoa butter (applied by PT), TG soft, Elastomull to fingers 1-4 under TG soft, Artiflex x1 from wrist to axilla, 1-6 cm to hand, 1-8 cm from wrist and "X" at elbow, then 1-10 cm short stretch bandages from wrist to axilla. Pts grandaughter did all wrapping  with only occasional VC's and demonstration byt therapist. Pts grandaughter showed pt how to exercise to loosen her wraps. Reminded all that wraps need to be removed if she is having numbness, pain or any discomfort.                                                                                                                                         PATIENT EDUCATION:  Education details: POC, try compression bra for trunk edema, wash sleeve every 2 days at a minimum Person educated: Patient and granddaughter Jamie [name] Education method: Explanation Education comprehension: verbalized understanding  HOME EXERCISE PROGRAM: Self MLD, post op exercises,   ASSESSMENT:  CLINICAL IMPRESSION:  Pts SOZO screen improved slightly from last visit. Garments have arrived and her granddaughter was able to don independently with LE slip eze. Gauntlet was able to be donned without difficulty. Sleeve seems to fit  well and they will monitor her fingers to be sure they do not swell. If they do, she will require a glove, but we are hopeful the gauntlet will do the trick. They were instructed in care of garments and they will monitor the swelling at home. They will call right away if they sense any problems. She will require a follow up visit or 2 over the next 8 weeks. If doing well next visit will likely be ready for DC.  OBJECTIVE IMPAIRMENTS: decreased activity tolerance, decreased cognition, decreased ROM, increased edema, impaired UE functional use, postural dysfunction, and pain.   ACTIVITY LIMITATIONS: carrying, lifting, bathing, and reach over head  PARTICIPATION LIMITATIONS: meal prep, medication management, personal finances, driving, and community activity  PERSONAL FACTORS:Left Breast Cancer s/p radiation, Parkinsons, neuro cognitive disorder, OA , spinal stenosis, GAD are also affecting patient's functional outcome.   REHAB POTENTIAL: Good  CLINICAL DECISION MAKING:  Stable/uncomplicated  EVALUATION COMPLEXITY: Low  GOALS: Goals reviewed with patient? Yes  SHORT TERM GOALS: Target date: 07/21/2022  Pt/family member will be educated in MLD for left UE lymphedema Baseline: Goal status: MET  2.  Pt will have left shoulder ROM equal to right for flexion and abduction Baseline:  Goal status: Ongoing  3.  Pt will have decreased tenderness left chest and trunk area Baseline:  Goal status: MET 4.  Pt will get benefit from compression bra for lateral trunk swelling Baseline:  Goal status:MET;purchased sports bra  5. Pt will be tolerant of  compression bandaging  Gaol Status: MET 07/31/22  6. Pt will be fit for appropriate Compression garment for Left UE lymphedema.  GOAL STATUS : Achieved 08/14/22  7. Pts. Caregivers will be able to manage pts lymphedema with MLD, compression garments and bandaging prn to prevent it from worsening.   GOAL: New PLAN:  PT FREQUENCY: 2-3 visits  PT DURATION: 8 weeks prn to assess management.  PLANNED INTERVENTIONS: Therapeutic exercises, Patient/Family education, Self Care, Orthotic/Fit training, Manual lymph drainage, Compression bandaging, scar mobilization, Manual therapy, and Re-evaluation  PLAN FOR NEXT SESSION:  SOZO screen? Reassess goals, Measure arm, discuss how things are going; MLD, have to bandage at all?, sleeve. May be ready for DC next visit or 2 07/24/22: Demographics sent to [facility] for compression sleeve insurance verification  Waynette Buttery, PT 08/14/2022, 4:11 PM

## 2022-08-21 DIAGNOSIS — L84 Corns and callosities: Secondary | ICD-10-CM | POA: Diagnosis not present

## 2022-08-21 DIAGNOSIS — B351 Tinea unguium: Secondary | ICD-10-CM | POA: Diagnosis not present

## 2022-08-21 DIAGNOSIS — I70203 Unspecified atherosclerosis of native arteries of extremities, bilateral legs: Secondary | ICD-10-CM | POA: Diagnosis not present

## 2022-08-22 ENCOUNTER — Ambulatory Visit
Admission: RE | Admit: 2022-08-22 | Discharge: 2022-08-22 | Disposition: A | Discharge status: Home / Self Care | Payer: Medicare PPO | Source: Ambulatory Visit | Attending: Family Medicine | Admitting: Family Medicine

## 2022-08-22 DIAGNOSIS — R140 Abdominal distension (gaseous): Secondary | ICD-10-CM

## 2022-08-22 DIAGNOSIS — Z90710 Acquired absence of both cervix and uterus: Secondary | ICD-10-CM | POA: Diagnosis not present

## 2022-08-27 ENCOUNTER — Encounter: Payer: Self-pay | Admitting: Neurology

## 2022-08-27 ENCOUNTER — Encounter: Payer: Self-pay | Admitting: Family Medicine

## 2022-08-27 DIAGNOSIS — G20A1 Parkinson's disease without dyskinesia, without mention of fluctuations: Secondary | ICD-10-CM

## 2022-08-27 DIAGNOSIS — I890 Lymphedema, not elsewhere classified: Secondary | ICD-10-CM

## 2022-08-27 DIAGNOSIS — Z7409 Other reduced mobility: Secondary | ICD-10-CM

## 2022-08-27 DIAGNOSIS — R29898 Other symptoms and signs involving the musculoskeletal system: Secondary | ICD-10-CM

## 2022-08-28 ENCOUNTER — Encounter: Payer: Self-pay | Admitting: Family Medicine

## 2022-08-28 ENCOUNTER — Other Ambulatory Visit: Payer: Self-pay | Admitting: Family Medicine

## 2022-08-28 ENCOUNTER — Other Ambulatory Visit: Payer: Self-pay

## 2022-08-28 DIAGNOSIS — G20A1 Parkinson's disease without dyskinesia, without mention of fluctuations: Secondary | ICD-10-CM

## 2022-08-28 DIAGNOSIS — F0280 Dementia in other diseases classified elsewhere without behavioral disturbance: Secondary | ICD-10-CM

## 2022-08-28 DIAGNOSIS — R140 Abdominal distension (gaseous): Secondary | ICD-10-CM

## 2022-08-29 ENCOUNTER — Encounter: Payer: Medicare PPO | Admitting: Family Medicine

## 2022-08-29 NOTE — Addendum Note (Signed)
Addended by: Saralyn Pilar on: 08/29/2022 11:31 AM   Modules accepted: Orders

## 2022-08-30 DIAGNOSIS — C50919 Malignant neoplasm of unspecified site of unspecified female breast: Secondary | ICD-10-CM | POA: Diagnosis not present

## 2022-08-30 DIAGNOSIS — F32A Depression, unspecified: Secondary | ICD-10-CM | POA: Diagnosis not present

## 2022-08-30 DIAGNOSIS — M810 Age-related osteoporosis without current pathological fracture: Secondary | ICD-10-CM | POA: Diagnosis not present

## 2022-08-30 DIAGNOSIS — G309 Alzheimer's disease, unspecified: Secondary | ICD-10-CM | POA: Diagnosis not present

## 2022-08-30 DIAGNOSIS — M170 Bilateral primary osteoarthritis of knee: Secondary | ICD-10-CM | POA: Diagnosis not present

## 2022-08-30 DIAGNOSIS — F02818 Dementia in other diseases classified elsewhere, unspecified severity, with other behavioral disturbance: Secondary | ICD-10-CM | POA: Diagnosis not present

## 2022-08-30 DIAGNOSIS — F0283 Dementia in other diseases classified elsewhere, unspecified severity, with mood disturbance: Secondary | ICD-10-CM | POA: Diagnosis not present

## 2022-08-30 DIAGNOSIS — G4700 Insomnia, unspecified: Secondary | ICD-10-CM | POA: Diagnosis not present

## 2022-08-30 DIAGNOSIS — G20A1 Parkinson's disease without dyskinesia, without mention of fluctuations: Secondary | ICD-10-CM | POA: Diagnosis not present

## 2022-09-04 DIAGNOSIS — G4700 Insomnia, unspecified: Secondary | ICD-10-CM | POA: Diagnosis not present

## 2022-09-04 DIAGNOSIS — F02818 Dementia in other diseases classified elsewhere, unspecified severity, with other behavioral disturbance: Secondary | ICD-10-CM | POA: Diagnosis not present

## 2022-09-04 DIAGNOSIS — F32A Depression, unspecified: Secondary | ICD-10-CM | POA: Diagnosis not present

## 2022-09-04 DIAGNOSIS — M810 Age-related osteoporosis without current pathological fracture: Secondary | ICD-10-CM | POA: Diagnosis not present

## 2022-09-04 DIAGNOSIS — M170 Bilateral primary osteoarthritis of knee: Secondary | ICD-10-CM | POA: Diagnosis not present

## 2022-09-04 DIAGNOSIS — G309 Alzheimer's disease, unspecified: Secondary | ICD-10-CM | POA: Diagnosis not present

## 2022-09-04 DIAGNOSIS — C50919 Malignant neoplasm of unspecified site of unspecified female breast: Secondary | ICD-10-CM | POA: Diagnosis not present

## 2022-09-04 DIAGNOSIS — F0283 Dementia in other diseases classified elsewhere, unspecified severity, with mood disturbance: Secondary | ICD-10-CM | POA: Diagnosis not present

## 2022-09-04 DIAGNOSIS — G20A1 Parkinson's disease without dyskinesia, without mention of fluctuations: Secondary | ICD-10-CM | POA: Diagnosis not present

## 2022-09-05 ENCOUNTER — Encounter: Payer: Self-pay | Admitting: Family Medicine

## 2022-09-06 ENCOUNTER — Encounter: Payer: Self-pay | Admitting: Neurology

## 2022-09-06 ENCOUNTER — Other Ambulatory Visit: Payer: Self-pay

## 2022-09-06 DIAGNOSIS — C50919 Malignant neoplasm of unspecified site of unspecified female breast: Secondary | ICD-10-CM | POA: Diagnosis not present

## 2022-09-06 DIAGNOSIS — F0283 Dementia in other diseases classified elsewhere, unspecified severity, with mood disturbance: Secondary | ICD-10-CM | POA: Diagnosis not present

## 2022-09-06 DIAGNOSIS — F32A Depression, unspecified: Secondary | ICD-10-CM | POA: Diagnosis not present

## 2022-09-06 DIAGNOSIS — G4700 Insomnia, unspecified: Secondary | ICD-10-CM | POA: Diagnosis not present

## 2022-09-06 DIAGNOSIS — G309 Alzheimer's disease, unspecified: Secondary | ICD-10-CM | POA: Diagnosis not present

## 2022-09-06 DIAGNOSIS — G20A1 Parkinson's disease without dyskinesia, without mention of fluctuations: Secondary | ICD-10-CM | POA: Diagnosis not present

## 2022-09-06 DIAGNOSIS — M170 Bilateral primary osteoarthritis of knee: Secondary | ICD-10-CM | POA: Diagnosis not present

## 2022-09-06 DIAGNOSIS — F02818 Dementia in other diseases classified elsewhere, unspecified severity, with other behavioral disturbance: Secondary | ICD-10-CM | POA: Diagnosis not present

## 2022-09-06 DIAGNOSIS — M810 Age-related osteoporosis without current pathological fracture: Secondary | ICD-10-CM | POA: Diagnosis not present

## 2022-09-06 MED ORDER — DONEPEZIL HCL 10 MG PO TABS: 10.0000 mg | ORAL_TABLET | Freq: Every morning | ORAL | Status: DC

## 2022-09-06 NOTE — Telephone Encounter (Signed)
Called patients daughter in law and going to  switch donzepil to AM from PM and wait a week if no improvement she is going to call [facility] to make sure nothing else is going on. She completley understood and will let us know

## 2022-09-07 ENCOUNTER — Telehealth (INDEPENDENT_AMBULATORY_CARE_PROVIDER_SITE_OTHER): Payer: Medicare PPO | Admitting: Family Medicine

## 2022-09-07 ENCOUNTER — Encounter: Payer: Self-pay | Admitting: Family Medicine

## 2022-09-07 ENCOUNTER — Ambulatory Visit (INDEPENDENT_AMBULATORY_CARE_PROVIDER_SITE_OTHER): Payer: Medicare PPO

## 2022-09-07 VITALS — Ht 68.0 in | Wt 178.0 lb

## 2022-09-07 DIAGNOSIS — Z0000 Encounter for general adult medical examination without abnormal findings: Secondary | ICD-10-CM | POA: Diagnosis not present

## 2022-09-07 DIAGNOSIS — G20B1 Parkinson's disease with dyskinesia, without mention of fluctuations: Secondary | ICD-10-CM | POA: Diagnosis not present

## 2022-09-07 DIAGNOSIS — M160 Bilateral primary osteoarthritis of hip: Secondary | ICD-10-CM

## 2022-09-07 DIAGNOSIS — I890 Lymphedema, not elsewhere classified: Secondary | ICD-10-CM

## 2022-09-07 DIAGNOSIS — R29898 Other symptoms and signs involving the musculoskeletal system: Secondary | ICD-10-CM | POA: Diagnosis not present

## 2022-09-07 NOTE — Progress Notes (Signed)
Subjective:   Jamie [name] is a 88 y.o. female who presents for Medicare Annual (Subsequent) preventive examination.  Visit Complete: Virtual  I connected with  Jamie [name] on 09/07/22 by a audio enabled telemedicine application and verified that I am speaking with the correct person using two identifiers.  Patient Location: Home  Provider Location: Home Office  I discussed the limitations of evaluation and management by telemedicine. The patient expressed understanding and agreed to proceed.  Patient Medicare AWV questionnaire was completed by the patient on ; I have confirmed that all information answered by patient is correct and no changes since this date.  Review of Systems     Cardiac Risk Factors include: advanced age (>55men, >73 women);hypertension     Objective:    Today's Vitals   09/07/22 0836 09/07/22 0837  Weight: 178 lb (80.7 kg)   Height: 5\' 8"  (1.727 m)   PainSc:  0-No pain   Body mass index is 27.06 kg/m.     09/07/2022    8:48 AM 07/13/2022    9:10 AM 07/04/2022    9:41 AM 06/23/2022   10:18 AM 05/23/2022    9:15 AM 04/11/2022   10:12 AM 02/28/2022   10:30 AM  Advanced Directives  Does Patient Have a Medical Advance Directive? Yes Yes Yes Yes Yes Yes Yes  Type of Estate agent of [place];Living will Living will Healthcare Power of [place];Living will Healthcare Power of [place];Living will Healthcare Power of [place];Living will Healthcare Power of [place];Living will Healthcare Power of [place];Living will  Does patient want to make changes to medical advance directive?   No - Patient declined No - Patient declined No - Patient declined No - Patient declined No - Patient declined  Copy of Healthcare Power of Attorney in Chart? No - copy requested  No - copy requested  No - copy requested No - copy requested     Current Medications (verified) Outpatient Encounter Medications as of 09/07/2022  Medication Sig   Biotin 1 MG  CAPS    carbidopa-levodopa (SINEMET CR) 50-200 MG tablet Take 1 tablet by mouth at bedtime.   carbidopa-levodopa (SINEMET IR) 25-100 MG tablet Take 2 tablets by mouth 3 (three) times daily. (Patient taking differently: Take 2 tablets by mouth 3 (three) times daily. Take 2/ 2/ 1)   cholecalciferol (VITAMIN D3) 25 MCG (1000 UNIT) tablet Take 2,000 Units by mouth daily.   cyanocobalamin (VITAMIN B12) 500 MCG tablet    donepezil (ARICEPT) 10 MG tablet Take 1 tablet (10 mg total) by mouth in the morning.   letrozole (FEMARA) 2.5 MG tablet Take 1 tablet (2.5 mg total) by mouth daily.   mirtazapine (REMERON) 15 MG tablet Take 1 tablet (15 mg total) by mouth at bedtime.   Multiple Vitamins-Minerals (PRESERVISION AREDS 2 PO) Take 1 tablet by mouth in the morning and at bedtime.    NON FORMULARY Herblax as needed   NON FORMULARY Cholesterol Reduction   omeprazole (PRILOSEC) 20 MG capsule TAKE 1 CAPSULE BY MOUTH DAILY AT 5:30 PM   rosuvastatin (CRESTOR) 5 MG tablet Take 1 tablet (5 mg total) by mouth at bedtime.   Vitamin E 100 units TABS Take 250 mg by mouth. Once a day   [DISCONTINUED] donepezil (ARICEPT) 5 MG tablet Take 1 tablet (5 mg total) by mouth at bedtime.   No facility-administered encounter medications on file as of 09/07/2022.    Allergies (verified) Flagyl [metronidazole]   History: Past Medical History:  Diagnosis Date   Abnormality of gait 04/18/2017   Degenerative joint disease of knee, right 03/19/2018   Injected April 18, 2018 Approved for Durolane R knee 3.4.20. Patient given another steroid injection Jun 24, 2018.  Repeat injection September 09, 2018 repeat injection February 27, 2019 repeat 6/110/20221 bilateral injections given December 01, 2019 Right knee injected April 20, 2020 monovisc May 19, 2018 Repeat steroid injection given June 16, 2020 repeat injection August 24, 2020   Essential hypertension 08/14/2016   Generalized anxiety disorder 06/27/2017   History of dizziness  06/27/2017   History of hysterectomy for benign disease 06/27/2017   History of vitamin D deficiency 08/22/2017   Leukocytes in urine 08/14/2016   Lumbar facet arthropathy 06/27/2017   Major neurocognitive disorder due to multiple etiologies 09/29/2020   Mixed hyperlipidemia 08/22/2017   Osteoarthritis of knee 09/18/2017   Pancolitis 08/14/2016   Parkinson's disease 03/13/2017   Prediabetes 08/22/2017   Right knee pain    Spinal stenosis of lumbar region    Weakness of both hips 04/18/2017   White coat syndrome without diagnosis of hypertension 08/22/2017   Past Surgical History:  Procedure Laterality Date   HYSTERECTOMY ABDOMINAL WITH SALPINGECTOMY     MASTECTOMY W/ SENTINEL NODE BIOPSY Left 08/18/2021   Procedure: LEFT MASTECTOMY WITH TARGETED LYMPH NODE DISSECTION;  Surgeon: Abigail Miyamoto, MD;  Location: [facility];  Service: General;  Laterality: Left;   TONSILLECTOMY     TOTAL ABDOMINAL HYSTERECTOMY W/ BILATERAL SALPINGOOPHORECTOMY     Family History  Problem Relation Age of Onset   Breast cancer Mother 65   Melanoma Father        dx. 2s   Lung cancer Father        dx. 37s   Healthy Son    Social History   Socioeconomic History   Marital status: Widowed    Spouse name: Not on file   Number of children: 3   Years of education: 12   Highest education level: High school graduate  Occupational History   Not on file  Tobacco Use   Smoking status: Never    Passive exposure: Never   Smokeless tobacco: Never  Vaping Use   Vaping status: Never Used  Substance and Sexual Activity   Alcohol use: No    Alcohol/week: 0.0 standard drinks of alcohol   Drug use: No   Sexual activity: Not Currently    Birth control/protection: Surgical  Other Topics Concern   Not on file  Social History Narrative   Pt lives alone in her 1 story home- she has 3 sons, and a high school graduate. She is right handed, she drinks coffee, and one soda a week mostly water    Social Determinants of Health   Financial Resource Strain: Low Risk  (09/07/2022)   Overall Financial Resource Strain (CARDIA)    Difficulty of Paying Living Expenses: Not hard at all  Food Insecurity: No Food Insecurity (09/07/2022)   Hunger Vital Sign    Worried About Running Out of Food in the Last Year: Never true    Ran Out of Food in the Last Year: Never true  Transportation Needs: No Transportation Needs (09/07/2022)   PRAPARE - Administrator, Civil Service (Medical): No    Lack of Transportation (Non-Medical): No  Physical Activity: Insufficiently Active (09/07/2022)   Exercise Vital Sign    Days of Exercise per Week: 2 days    Minutes of Exercise per Session: 30 min  Stress: No Stress Concern Present (09/07/2022)   [employer] of Occupational Health - Occupational Stress Questionnaire    Feeling of Stress : Not at all  Social Connections: Moderately Integrated (09/07/2022)   Social Connection and Isolation Panel [NHANES]    Frequency of Communication with Friends and Family: More than three times a week    Frequency of Social Gatherings with Friends and Family: More than three times a week    Attends Religious Services: More than 4 times per year    Active Member of [employer] or Organizations: Yes    Attends Banker Meetings: More than 4 times per year    Marital Status: Widowed    Tobacco Counseling Counseling given: Not Answered   Clinical Intake:  Pre-visit preparation completed: No  Pain : No/denies pain Pain Score: 0-No pain     BMI - recorded: 27.06 Nutritional Status: BMI 25 -29 Overweight Nutritional Risks: None Diabetes: No  How often do you need to have someone help you when you read instructions, pamphlets, or other written materials from your doctor or pharmacy?: 3 - Sometimes (Family assist)  Interpreter Needed?: No  Information entered by :: Theresa Mulligan LPN   Activities of Daily Living    09/07/2022    8:44 AM  02/22/2022    3:08 PM  In your present state of health, do you have any difficulty performing the following activities:  Hearing? 0 0  Vision? 0 0  Difficulty concentrating or making decisions? 0 1  Walking or climbing stairs? 0 0  Dressing or bathing? 1 0  Comment Aide and family assist   Doing errands, shopping? 1 1  Comment Aide and family Advice worker and eating ? Y   Comment Aide and family assist   Using the Toilet? N   In the past six months, have you accidently leaked urine? Y   Comment Wears pull ups. Followed by PCP   Do you have problems with loss of bowel control? N   Managing your Medications? Y   Comment Aide and family assist   Managing your Finances? Y   Comment Aide and family assist   Housekeeping or managing your Housekeeping? Y   Comment Aide and family assist     Patient Care Team: [name], PA as PCP - General (Family Medicine) Othella Boyer, MD as Consulting Physician (Cardiology) Bufford Buttner, MD as Referring Physician (Dermatology) Rehabilitation, [place], Octaviano Batty, DO as Consulting Physician (Neurology) Pershing Proud, RN as Oncology Nurse Navigator Donnelly Angelica, RN as Oncology Nurse Navigator Abigail Miyamoto, MD as Consulting Physician (General Surgery) Serena Croissant, MD as Consulting Physician (Hematology and Oncology) Lonie Peak, MD as Attending Physician (Radiation Oncology)  Indicate any recent Medical Services you may have received from other than [facility] providers in the past year (date may be approximate).     Assessment:   This is a routine wellness examination for [place].  Hearing/Vision screen Hearing Screening - Comments:: Denies hearing difficulties   Vision Screening - Comments:: Wears rx glasses - up to date with routine [facility] exams with  [facility]  Dietary issues and exercise activities discussed:     Goals Addressed               This Visit's Progress     Get stronger  and stronger (pt-stated)         Depression Screen    09/07/2022    8:43 AM 08/02/2022  9:08 AM 05/04/2022    2:23 PM 02/22/2022    3:08 PM 11/22/2021   11:17 AM 08/16/2021    2:28 PM 06/07/2021    4:16 PM  PHQ 2/9 Scores  PHQ - 2 Score 0 0 4 0 0 0 0  PHQ- 9 Score 0 6 10 2 1 2  0    Fall Risk    09/07/2022    8:47 AM 07/13/2022    9:04 AM 01/10/2022   10:47 AM 08/16/2021    2:28 PM 07/05/2021   10:39 AM  Fall Risk   Falls in the past year? 0 0 0 0 0  Number falls in past yr: 0 0 0 0 0  Injury with Fall? 0 0 0 0 0  Risk for fall due to : No Fall Risks   No Fall Risks   Follow up Falls prevention discussed Falls evaluation completed  Falls evaluation completed     MEDICARE RISK AT HOME:  Medicare Risk at Home - 09/07/22 0854     Any stairs in or around the home? Yes    If so, are there any without handrails? No    Home free of loose throw rugs in walkways, pet beds, electrical cords, etc? Yes    Adequate lighting in your home to reduce risk of falls? Yes    Life alert? Yes    Use of a cane, walker or w/c? No    Grab bars in the bathroom? Yes    Shower chair or bench in shower? Yes    Elevated toilet seat or a handicapped toilet? No             TIMED UP AND GO:  Was the test performed?  No    Cognitive Function:    08/28/2018    1:45 PM  MMSE - Mini Mental State Exam  Orientation to time 4  Orientation to Place 5  Registration 3  Attention/ Calculation 5  Recall 2  Language- name 2 objects 2  Language- repeat 1  Language- follow 3 step command 3  Language- read & follow direction 1  Write a sentence 1  Copy design 1  Total score 28        09/07/2022    8:48 AM 08/16/2021    2:30 PM 07/27/2020   11:20 AM 08/28/2018    1:44 PM  6CIT Screen  What Year? 4 points 4 points 4 points 0 points  What month? 3 points 3 points 0 points 0 points  What time? 0 points 0 points 0 points 0 points  Count back from 20 0 points 0 points 0 points 2 points  Months in  reverse 4 points 0 points 4 points 0 points  Repeat phrase 6 points 4 points 8 points 6 points  Total Score 17 points 11 points 16 points 8 points    Immunizations Immunization History  Administered Date(s) Administered   Fluad Quad(high Dose 65+) 12/15/2019   Influenza, High Dose Seasonal PF 12/08/2016, 12/21/2017, 11/29/2018   Influenza-Unspecified 12/21/2017, 11/12/2020   Moderna SARS-COV2 Booster Vaccination 01/16/2020   PFIZER(Purple Top)SARS-COV-2 Vaccination 03/24/2019, 04/14/2019   Pneumococcal Conjugate-13 07/09/2013   Pneumococcal Polysaccharide-23 09/13/2001, 04/11/2006   Tdap 07/09/2013   Zoster Recombinant(Shingrix) 07/27/2020    TDAP status: Up to date  Flu Vaccine status: Up to date  Pneumococcal vaccine status: Up to date  Covid-19 vaccine status: Completed vaccines  Qualifies for Shingles Vaccine? Yes   Zostavax completed Yes   Shingrix Completed?: Yes  Screening Tests Health Maintenance  Topic Date Due   Zoster Vaccines- Shingrix (2 of 2) 09/21/2020   COVID-19 Vaccine (3 - Pfizer risk series) 09/23/2022 (Originally 02/13/2020)   INFLUENZA VACCINE  09/21/2022   DTaP/Tdap/Td (2 - Td or Tdap) 07/10/2023   Medicare Annual Wellness (AWV)  09/07/2023   Pneumonia Vaccine 56+ Years old  Completed   DEXA SCAN  Completed   HPV VACCINES  Aged Out    Health Maintenance  Health Maintenance Due  Topic Date Due   Zoster Vaccines- Shingrix (2 of 2) 09/21/2020    Colorectal cancer screening: No longer required.   Mammogram status: No longer required due to Age.  Bone Density status: Completed 02/10/21. Results reflect: Bone density results: OSTEOPOROSIS. Repeat every   years.  Lung Cancer Screening: (Low Dose CT Chest recommended if Age 71-80 years, 20 pack-year currently smoking OR have quit w/in 15years.) does not qualify.     Additional Screening:  Hepatitis C Screening: does not qualify; Completed    Vision Screening: Recommended annual  ophthalmology exams for early detection of glaucoma and other disorders of the eye. Is the patient up to date with their annual eye exam?  Yes  Who is the provider or what is the name of the office in which the patient attends annual eye exams? [facility] If pt is not established with a provider, would they like to be referred to a provider to establish care? No .   Dental Screening: Recommended annual dental exams for proper oral hygiene   Community Resource Referral / Chronic Care Management:  CRR required this visit?  No   CCM required this visit?  No     Plan:     I have personally reviewed and noted the following in the patient's chart:   Medical and social history Use of alcohol, tobacco or illicit drugs  Current medications and supplements including opioid prescriptions. Patient is not currently taking opioid prescriptions. Functional ability and status Nutritional status Physical activity Advanced directives List of other physicians Hospitalizations, surgeries, and ER visits in previous 12 months Vitals Screenings to include cognitive, depression, and falls Referrals and appointments  In addition, I have reviewed and discussed with patient certain preventive protocols, quality metrics, and best practice recommendations. A written personalized care plan for preventive services as well as general preventive health recommendations were provided to patient.     Tillie Rung, LPN   1/61/0960   After Visit Summary: (Mail) Due to this being a telephonic visit, the after visit summary with patients personalized plan was offered to patient via mail   Nurse Notes: None

## 2022-09-07 NOTE — Assessment & Plan Note (Signed)
Bilateral hip osteoarthritis produces significant pain that makes finding comfortable position in a flat bed difficult.  A hospital bed would allow her to adjust herself more easily to find comfortable positions and reduce her pain.

## 2022-09-07 NOTE — Patient Instructions (Addendum)
Jamie [name] , Thank you for taking time to come for your Medicare Wellness Visit. I appreciate your ongoing commitment to your health goals. Please review the following plan we discussed and let me know if I can assist you in the future.   These are the goals we discussed:  Goals       Get stronger and stronger (pt-stated)        This is a list of the screening recommended for you and due dates:  Health Maintenance  Topic Date Due   Zoster (Shingles) Vaccine (2 of 2) 09/21/2020   COVID-19 Vaccine (3 - Pfizer risk series) 09/23/2022*   Flu Shot  09/21/2022   DTaP/Tdap/Td vaccine (2 - Td or Tdap) 07/10/2023   Medicare Annual Wellness Visit  09/07/2023   Pneumonia Vaccine  Completed   DEXA scan (bone density measurement)  Completed   HPV Vaccine  Aged Out  *Topic was postponed. The date shown is not the original due date.    Advanced directives: Please bring a copy of your health care power of attorney and living will to the office to be added to your chart at your convenience.   Conditions/risks identified: None  Next appointment: Follow up in one year for your annual wellness visit     Preventive Care 65 Years and Older, Female Preventive care refers to lifestyle choices and visits with your health care provider that can promote health and wellness. What does preventive care include? A yearly physical exam. This is also called an annual well check. Dental exams once or twice a year. Routine eye exams. Ask your health care provider how often you should have your eyes checked. Personal lifestyle choices, including: Daily care of your teeth and gums. Regular physical activity. Eating a healthy diet. Avoiding tobacco and drug use. Limiting alcohol use. Practicing safe sex. Taking low-dose aspirin every day. Taking vitamin and mineral supplements as recommended by your health care provider. What happens during an annual well check? The services and screenings done by your  health care provider during your annual well check will depend on your age, overall health, lifestyle risk factors, and family history of disease. Counseling  Your health care provider may ask you questions about your: Alcohol use. Tobacco use. Drug use. Emotional well-being. Home and relationship well-being. Sexual activity. Eating habits. History of falls. Memory and ability to understand (cognition). Work and work Astronomer. Reproductive health. Screening  You may have the following tests or measurements: Height, weight, and BMI. Blood pressure. Lipid and cholesterol levels. These may be checked every 5 years, or more frequently if you are over 65 years old. Skin check. Lung cancer screening. You may have this screening every year starting at age 60 if you have a 30-pack-year history of smoking and currently smoke or have quit within the past 15 years. Fecal occult blood test (FOBT) of the stool. You may have this test every year starting at age 10. Flexible sigmoidoscopy or colonoscopy. You may have a sigmoidoscopy every 5 years or a colonoscopy every 10 years starting at age 59. Hepatitis C blood test. Hepatitis B blood test. Sexually transmitted disease (STD) testing. Diabetes screening. This is done by checking your blood sugar (glucose) after you have not eaten for a while (fasting). You may have this done every 1-3 years. Bone density scan. This is done to screen for osteoporosis. You may have this done starting at age 30. Mammogram. This may be done every 1-2 years. Talk to your health  care provider about how often you should have regular mammograms. Talk with your health care provider about your test results, treatment options, and if necessary, the need for more tests. Vaccines  Your health care provider may recommend certain vaccines, such as: Influenza vaccine. This is recommended every year. Tetanus, diphtheria, and acellular pertussis (Tdap, Td) vaccine. You may  need a Td booster every 10 years. Zoster vaccine. You may need this after age 80. Pneumococcal 13-valent conjugate (PCV13) vaccine. One dose is recommended after age 27. Pneumococcal polysaccharide (PPSV23) vaccine. One dose is recommended after age 73. Talk to your health care provider about which screenings and vaccines you need and how often you need them. This information is not intended to replace advice given to you by your health care provider. Make sure you discuss any questions you have with your health care provider. Document Released: 03/05/2015 Document Revised: 10/27/2015 Document Reviewed: 12/08/2014 Elsevier Interactive Patient Education  2017 [employer].  Fall Prevention in the Home Falls can cause injuries. They can happen to people of all ages. There are many things you can do to make your home safe and to help prevent falls. What can I do on the outside of my home? Regularly fix the edges of walkways and driveways and fix any cracks. Remove anything that might make you trip as you walk through a door, such as a raised step or threshold. Trim any bushes or trees on the path to your home. Use bright outdoor lighting. Clear any walking paths of anything that might make someone trip, such as rocks or tools. Regularly check to see if handrails are loose or broken. Make sure that both sides of any steps have handrails. Any raised decks and porches should have guardrails on the edges. Have any leaves, snow, or ice cleared regularly. Use sand or salt on walking paths during winter. Clean up any spills in your garage right away. This includes oil or grease spills. What can I do in the bathroom? Use night lights. Install grab bars by the toilet and in the tub and shower. Do not use towel bars as grab bars. Use non-skid mats or decals in the tub or shower. If you need to sit down in the shower, use a plastic, non-slip stool. Keep the floor dry. Clean up any water that spills on  the floor as soon as it happens. Remove soap buildup in the tub or shower regularly. Attach bath mats securely with double-sided non-slip rug tape. Do not have throw rugs and other things on the floor that can make you trip. What can I do in the bedroom? Use night lights. Make sure that you have a light by your bed that is easy to reach. Do not use any sheets or blankets that are too big for your bed. They should not hang down onto the floor. Have a firm chair that has side arms. You can use this for support while you get dressed. Do not have throw rugs and other things on the floor that can make you trip. What can I do in the kitchen? Clean up any spills right away. Avoid walking on wet floors. Keep items that you use a lot in easy-to-reach places. If you need to reach something above you, use a strong step stool that has a grab bar. Keep electrical cords out of the way. Do not use floor polish or wax that makes floors slippery. If you must use wax, use non-skid floor wax. Do not have throw  rugs and other things on the floor that can make you trip. What can I do with my stairs? Do not leave any items on the stairs. Make sure that there are handrails on both sides of the stairs and use them. Fix handrails that are broken or loose. Make sure that handrails are as long as the stairways. Check any carpeting to make sure that it is firmly attached to the stairs. Fix any carpet that is loose or worn. Avoid having throw rugs at the top or bottom of the stairs. If you do have throw rugs, attach them to the floor with carpet tape. Make sure that you have a light switch at the top of the stairs and the bottom of the stairs. If you do not have them, ask someone to add them for you. What else can I do to help prevent falls? Wear shoes that: Do not have high heels. Have rubber bottoms. Are comfortable and fit you well. Are closed at the toe. Do not wear sandals. If you use a stepladder: Make sure  that it is fully opened. Do not climb a closed stepladder. Make sure that both sides of the stepladder are locked into place. Ask someone to hold it for you, if possible. Clearly mark and make sure that you can see: Any grab bars or handrails. First and last steps. Where the edge of each step is. Use tools that help you move around (mobility aids) if they are needed. These include: Canes. Walkers. Scooters. Crutches. Turn on the lights when you go into a dark area. Replace any light bulbs as soon as they burn out. Set up your furniture so you have a clear path. Avoid moving your furniture around. If any of your floors are uneven, fix them. If there are any pets around you, be aware of where they are. Review your medicines with your doctor. Some medicines can make you feel dizzy. This can increase your chance of falling. Ask your doctor what other things that you can do to help prevent falls. This information is not intended to replace advice given to you by your health care provider. Make sure you discuss any questions you have with your health care provider. Document Released: 12/03/2008 Document Revised: 07/15/2015 Document Reviewed: 03/13/2014 Elsevier Interactive Patient Education  2017 [employer].

## 2022-09-07 NOTE — Progress Notes (Signed)
Virtual Visit via Video Note  I connected with Jamie [name] on 09/07/22 at  3:50 PM EDT by a video enabled telemedicine application and verified that I am speaking with the correct person using two identifiers.  Patient Location: Home Provider Location: Office/Clinic  I discussed the limitations, risks, security, and privacy concerns of performing an evaluation and management service by video and the availability of in person appointments. I also discussed with the patient that there may be a patient responsible charge related to this service. The patient expressed understanding and agreed to proceed.    Subjective   Patient ID: Jamie [name], female    DOB: 10/27/1934  Age: 88 y.o. MRN: [id number]  No chief complaint on file.   HPI Jamie [name] is a 88 y.o. female presenting today for follow up of need for hospital bed.  At prior appointment, patient and her daughter both mentioned that a hospital bed would be helpful, so appointment today was scheduled to discuss this needs more in-depth.  She has a history of arthritis of her hips which makes it difficult to find a comfortable position on a flat bed.  She also has Parkinson's disease with tremors that significantly affect movement in and out of bed.  She has lymphedema in her left arm due to breast cancer which reduces her strength to be able to push herself into and out of bed.     ROS Negative unless otherwise noted in HPI  Outpatient Medications Prior to Visit  Medication Sig   Biotin 1 MG CAPS    carbidopa-levodopa (SINEMET CR) 50-200 MG tablet Take 1 tablet by mouth at bedtime.   carbidopa-levodopa (SINEMET IR) 25-100 MG tablet Take 2 tablets by mouth 3 (three) times daily. (Patient taking differently: Take 2 tablets by mouth 3 (three) times daily. Take 2/ 2/ 1)   cholecalciferol (VITAMIN D3) 25 MCG (1000 UNIT) tablet Take 2,000 Units by mouth daily.   cyanocobalamin (VITAMIN B12) 500 MCG tablet    donepezil (ARICEPT)  10 MG tablet Take 1 tablet (10 mg total) by mouth in the morning.   letrozole (FEMARA) 2.5 MG tablet Take 1 tablet (2.5 mg total) by mouth daily.   mirtazapine (REMERON) 15 MG tablet Take 1 tablet (15 mg total) by mouth at bedtime.   Multiple Vitamins-Minerals (PRESERVISION AREDS 2 PO) Take 1 tablet by mouth in the morning and at bedtime.    NON FORMULARY Herblax as needed   NON FORMULARY Cholesterol Reduction   omeprazole (PRILOSEC) 20 MG capsule TAKE 1 CAPSULE BY MOUTH DAILY AT 5:30 PM   rosuvastatin (CRESTOR) 5 MG tablet Take 1 tablet (5 mg total) by mouth at bedtime.   Vitamin E 100 units TABS Take 250 mg by mouth. Once a day   No facility-administered medications prior to visit.     Objective:     Physical Exam General: Speaking clearly in complete sentences without any shortness of breath.  Alert and oriented x3.  Normal judgment. No apparent acute distress.   Assessment & Plan:  Primary osteoarthritis of both hips Assessment & Plan: Bilateral hip osteoarthritis produces significant pain that makes finding comfortable position in a flat bed difficult.  A hospital bed would allow her to adjust herself more easily to find comfortable positions and reduce her pain.  Orders: -     For home use only DME Hospital bed  Parkinson's disease with dyskinesia, unspecified whether manifestations fluctuate Assessment & Plan: Patient has tremors with Parkinson's disease  which inhibits movement in and out of bed.  It makes it difficult for her to safely readjust in bed as well.  A hospital bed would allow her to reposition herself while in bed to alleviate pain as well as better position herself when transferring in and out of bed to prevent falling due to tremors/decreased strength.  Orders: -     For home use only DME Hospital bed  Lymphedema of left arm Assessment & Plan: Developed after diagnosis of estrogen receptor positive breast cancer.  She has lost strength in her left arm because  of this, making it difficult to maneuver in a normal bed, get up, or get into bed.  She would benefit from the semi-electric hospital bed to assist in better positioning herself to reduce pain and safely transfer into and out of the bed.  Orders: -     For home use only DME Hospital bed  Weakness of both hips -     For home use only DME Hospital bed    Return as scheduled on 11/09/2022.   I discussed the assessment and treatment plan with the patient. The patient was provided an opportunity to ask questions, and all were answered. The patient agreed with the plan and demonstrated an understanding of the instructions.   The patient was advised to call back or seek an in-person evaluation if the symptoms worsen or if the condition fails to improve as anticipated.  The above assessment and management plan was discussed with the patient. The patient verbalized understanding of and has agreed to the management plan.   Jamie Quitter, PA

## 2022-09-07 NOTE — Assessment & Plan Note (Signed)
Developed after diagnosis of estrogen receptor positive breast cancer.  She has lost strength in her left arm because of this, making it difficult to maneuver in a normal bed, get up, or get into bed.  She would benefit from the semi-electric hospital bed to assist in better positioning herself to reduce pain and safely transfer into and out of the bed.

## 2022-09-07 NOTE — Assessment & Plan Note (Signed)
Patient has tremors with Parkinson's disease which inhibits movement in and out of bed.  It makes it difficult for her to safely readjust in bed as well.  A hospital bed would allow her to reposition herself while in bed to alleviate pain as well as better position herself when transferring in and out of bed to prevent falling due to tremors/decreased strength.

## 2022-09-11 ENCOUNTER — Other Ambulatory Visit: Payer: Self-pay

## 2022-09-11 DIAGNOSIS — M2042 Other hammer toe(s) (acquired), left foot: Secondary | ICD-10-CM | POA: Diagnosis not present

## 2022-09-11 DIAGNOSIS — B351 Tinea unguium: Secondary | ICD-10-CM | POA: Diagnosis not present

## 2022-09-11 DIAGNOSIS — I70203 Unspecified atherosclerosis of native arteries of extremities, bilateral legs: Secondary | ICD-10-CM | POA: Diagnosis not present

## 2022-09-11 DIAGNOSIS — L84 Corns and callosities: Secondary | ICD-10-CM | POA: Diagnosis not present

## 2022-09-12 DIAGNOSIS — G20A1 Parkinson's disease without dyskinesia, without mention of fluctuations: Secondary | ICD-10-CM | POA: Diagnosis not present

## 2022-09-13 ENCOUNTER — Ambulatory Visit: Payer: Medicare PPO | Attending: Hematology and Oncology

## 2022-09-13 DIAGNOSIS — Z170 Estrogen receptor positive status [ER+]: Secondary | ICD-10-CM | POA: Diagnosis not present

## 2022-09-13 DIAGNOSIS — R293 Abnormal posture: Secondary | ICD-10-CM | POA: Diagnosis not present

## 2022-09-13 DIAGNOSIS — C50312 Malignant neoplasm of lower-inner quadrant of left female breast: Secondary | ICD-10-CM | POA: Insufficient documentation

## 2022-09-13 DIAGNOSIS — Z483 Aftercare following surgery for neoplasm: Secondary | ICD-10-CM | POA: Insufficient documentation

## 2022-09-13 DIAGNOSIS — I890 Lymphedema, not elsewhere classified: Secondary | ICD-10-CM | POA: Insufficient documentation

## 2022-09-13 DIAGNOSIS — M25612 Stiffness of left shoulder, not elsewhere classified: Secondary | ICD-10-CM | POA: Diagnosis not present

## 2022-09-13 NOTE — Therapy (Signed)
OUTPATIENT PHYSICAL THERAPY  UPPER EXTREMITY ONCOLOGY TREATMENT  Patient Name: Jamie [name] MRN: [id number] DOB:1934-04-07, 88 y.o., female Today's Date: 09/13/2022  END OF SESSION:  PT End of Session - 09/13/22 1405     Visit Number 17    Number of Visits 19    Date for PT Re-Evaluation 10/09/22    PT Start Time 1406    PT Stop Time 1455    PT Time Calculation (min) 49 min    Activity Tolerance Patient tolerated treatment well    Behavior During Therapy [facility] for tasks assessed/performed               Past Medical History:  Diagnosis Date   Abnormality of gait 04/18/2017   Degenerative joint disease of knee, right 03/19/2018   Injected April 18, 2018 Approved for Durolane R knee 3.4.20. Patient given another steroid injection Jun 24, 2018.  Repeat injection September 09, 2018 repeat injection February 27, 2019 repeat 6/110/20221 bilateral injections given December 01, 2019 Right knee injected April 20, 2020 monovisc May 19, 2018 Repeat steroid injection given June 16, 2020 repeat injection August 24, 2020   Essential hypertension 08/14/2016   Generalized anxiety disorder 06/27/2017   History of dizziness 06/27/2017   History of hysterectomy for benign disease 06/27/2017   History of vitamin D deficiency 08/22/2017   Leukocytes in urine 08/14/2016   Lumbar facet arthropathy 06/27/2017   Major neurocognitive disorder due to multiple etiologies 09/29/2020   Mixed hyperlipidemia 08/22/2017   Osteoarthritis of knee 09/18/2017   Pancolitis 08/14/2016   Parkinson's disease 03/13/2017   Prediabetes 08/22/2017   Right knee pain    Spinal stenosis of lumbar region    Weakness of both hips 04/18/2017   White coat syndrome without diagnosis of hypertension 08/22/2017   Past Surgical History:  Procedure Laterality Date   HYSTERECTOMY ABDOMINAL WITH SALPINGECTOMY     MASTECTOMY W/ SENTINEL NODE BIOPSY Left 08/18/2021   Procedure: LEFT MASTECTOMY WITH TARGETED LYMPH NODE DISSECTION;   Surgeon: Abigail Miyamoto, MD;  Location: [facility];  Service: General;  Laterality: Left;   TONSILLECTOMY     TOTAL ABDOMINAL HYSTERECTOMY W/ BILATERAL SALPINGOOPHORECTOMY     Patient Active Problem List   Diagnosis Date Noted   Osteoarthritis, hip, bilateral 09/07/2022   Lymphedema of left arm 09/07/2022   Gastroesophageal reflux disease without esophagitis 03/19/2022   Heartburn 03/19/2022   S/P left mastectomy 08/18/2021   Genetic testing 07/25/2021   Family history of breast cancer 07/14/2021   Malignant neoplasm of lower-inner quadrant of left breast in female, estrogen receptor positive (HCC) 07/11/2021   Mild episode of recurrent major depressive disorder (HCC) 04/06/2021   Age-related osteoporosis without current pathological fracture 04/06/2021   Greater trochanteric bursitis, left 01/21/2021   Major neurocognitive disorder due to multiple etiologies 09/29/2020   Right knee pain 05/07/2018   Osteoarthritis of knees, bilateral 03/19/2018   Osteoarthritis of knee 09/18/2017   Mixed hyperlipidemia 08/22/2017   Prediabetes 08/22/2017   White coat syndrome without diagnosis of hypertension 08/22/2017   History of vitamin D deficiency 08/22/2017   Lumbar facet arthropathy 06/27/2017   History of hysterectomy for benign disease 06/27/2017   Generalized anxiety disorder 06/27/2017   History of dizziness 06/27/2017   Other fatigue 06/27/2017   Overweight (BMI 25.0-29.9) 06/27/2017   Weakness of both hips 04/18/2017   Abnormality of gait 04/18/2017   Spinal stenosis of lumbar region 04/18/2017   Parkinson's disease 03/13/2017   Pancolitis 08/14/2016  Essential hypertension 08/14/2016   Leukocytes in urine 08/14/2016       REFERRING PROVIDER: Serena Croissant MD  REFERRING DIAG: Left UE lymphedema  THERAPY DIAG:  Lymphedema, not elsewhere classified  Aftercare following surgery for neoplasm  Stiffness of left shoulder, not elsewhere  classified  Malignant neoplasm of lower-inner quadrant of left breast in female, estrogen receptor positive (HCC)  Abnormal posture  ONSET DATE: 06/12/2022  Rationale for Evaluation and Treatment: Rehabilitation  SUBJECTIVE:                                                                                                                                                                                           SUBJECTIVE STATEMENT:  Different people have put the sleeve on for her without any problem.Every once in a while I get some chest pain..   I had some home health for a few visits.   PERTINENT HISTORY:  Patient was diagnosed on 06/27/2021 with left grade 2 invasive ductal carcinoma breast cancer. She underwent a left mastectomy and targeted axillary lymph node dissection (7/7 nodes positive) on 08/18/2021. It is triple positive with a Ki67 of 40%. She had radiation. Patient has cognitive deficits effecting memory and was diagnosed with Parkinson's in 2020   PAIN:  Are you having pain? No chest is just tender to touch  PRECAUTIONS: Left UE lymphedema, Parkinsons, neuro cognitive disorder, OA , spinal stenosis, GAD  WEIGHT BEARING RESTRICTIONS: No  FALLS:  Has patient fallen in last 6 months? No  LIVING ENVIRONMENT: Lives with: lives alone, but has a family member with her every night, and they cook dinner for her Lives in: House/apartment Stairs: Yes; External: 4 steps; can reach both Has following equipment at home: Single point cane, Walker - 2 wheeled, and Grab bars  OCCUPATION: NA  LEISURE: read,  HAND DOMINANCE: right   PRIOR LEVEL OF FUNCTION: Independent with basic ADLs  PATIENT GOALS: Learn how to manage swelling   OBJECTIVE:  COGNITION: Overall cognitive status: Impaired   PALPATION: Tender left chest and left lateral trunk  OBSERVATIONS / OTHER ASSESSMENTS: mild redness left prox arm, swelling visible and palpable at left lateral trunk  SENSATION: Light  touch: Appears intact    POSTURE: forward head, rounded shoulders  UPPER EXTREMITY AROM/PROM:  A/PROM RIGHT   eval   Shoulder extension   Shoulder flexion 127  Shoulder abduction 124  Shoulder internal rotation   Shoulder external rotation     (Blank rows = not tested)  A/PROM LEFT   eval Left 07/05/22 LEFT 09/13/2022  Shoulder extension     Shoulder flexion 126 pulls at ribs 130 pulls  at ribs, 138 after P/ROM 125  Shoulder abduction 115 118, 125 after P/ROM 125  Shoulder internal rotation     Shoulder external rotation       (Blank rows = not tested)  CERVICAL AROM: All within functional limits:  UPPER EXTREMITY STRENGTH:   LYMPHEDEMA ASSESSMENTS:  SURGERY TYPE/DATE: 08/18/21 Left Mastectomy with NUMBER OF LYMPH NODES REMOVED: 7/7 CHEMOTHERAPY: NO RADIATION:Yes, completed 11/09/2021 HORMONE TREATMENT: Letrozole INFECTIONS: NO Herceptin infusions will end May 14  LYMPHEDEMA ASSESSMENTS:   LANDMARK RIGHT  eval RIGHT 07/21/2022 Right 08/01/22  At axilla  34.5 33.4 34.1  15 cm proximal to olecranon process 33.8 32.9   10 cm proximal to olecranon process 31.5 31.5 31.3  Olecranon process 26.5 26.0 26.2  15 cm proximal to ulnar styloid process 25.4 25.4   10 cm proximal to ulnar styloid process 22.8 23.3 22.7  Just proximal to ulnar styloid process 16 16.05 16.1  Across hand at thumb web space 18.1 18.1 17.4  At base of 2nd digit 6.0 6.0 6.0  (Blank rows = not tested)  LANDMARK LEFT  eval LEFT 07/21/22 LEFT 07/24/22 LEFT 08/01/2022 LEFT 09/13/2022  At axilla  33.8 32.8 33 32 33  15 cm proximal to olecranon process 32.4 31.8 30.3 31.0 30.3  10 cm proximal to olecranon process 31.8 31.1 30.4 31.8 30.8  Olecranon process 25.8 24.6 25 25.1 24.9  15 cm proximal to ulnar styloid process 25 24.8 25.3 25 24.6  10 cm proximal to ulnar styloid process 22.4 22.7 23 22.7 22.3  Just proximal to ulnar styloid process 16.1 16.1 15.6 15.9 15.7  Across hand at thumb web space  18.1 17.9 17.4 17.7 17.3  At base of 2nd digit 6.0 6.0 5.9 5.8 5.9  (Blank rows = not tested)   L-DEX LYMPHEDEMA SCREENING: The patient was assessed using the L-Dex machine today to produce a lymphedema index baseline score. The patient will be reassessed on a regular basis (typically every 3 months) to obtain new L-Dex scores. If the score is > 6.5 points away from his/her baseline score indicating onset of subclinical lymphedema, it will be recommended to wear a compression garment for 4 weeks, 12 hours per day and then be reassessed. If the score continues to be > 6.5 points from baseline at reassessment, we will initiate lymphedema treatment. Assessing in this manner has a 95% rate of preventing clinically significant lymphedema.  LLIS: 6  TODAY'S TREATMENT:   09/13/2022 Removed pt garments and visually assessed and measured pts arm.  Measurements improved from previous visits. Performed SOZO;first attempt did not read Right foot, so performed again and pt is now in the green. Discussed decreasing sleeve wear due to improvement placing pt in the green, but after looking at trends decided it best to remain in the sleeve as pt says she can wear it comfortably without any concerns and they have gotten in a routine with it.. Measured AROM left shoulder. Pt now has minimal pain, but continues with decreased left shoulder ROM. Reminded pt about her supine flexion AROM exercises. Pts. Son donned compression sleeve and gauntlet  08/14/22 SOZO screen performed ; 16.4 above, but down slightly from previous screen Showed grand daughter and son how to use Arion EZ slide leg to don UE sleeve.  She did very well getting sleeve on well and able to pull slide out properly without sleeve falling down. Grand daughter then donned gauntlet. Sleeve and gauntlet seem to fit very well. Advised them to have pt  wear several hours and to check fingers to be sure she is not swelling. Sleeve and gauntlet seem to fit well  and are comfortable for pt. Discussed measuring pt 1x per week to track swelling using a spot in the forearm and a spot in the upper arm, but being consistent where it is measured.reminded them that sleeve and glove only meant to be worn during the day time, never at night.  If arm does swell she may need to be wrapped for a day or 2 before going back to the sleeve.Discussed laundering every few days to keep sleeve snug. Showed family members Juzo foot slipee and Arion leg Slip-EZ on internet [name] is considering showing her grand mothers caregiver how to perform MLD to free her up some. Will follow up with pt 1x in a month to reassess     08/07/2022 Removed bandages and pts grand daughter helped her wash her arm. PROM left shoulder to restore mobility MLD to Lt UE in supine as follows: Short neck, 5 diaphragmatic breaths, Lt inguinal and Rt axillary nodes,  Lt axillo-inguinal and anterior inter-axillary anastomosis, then focused on Lt UE working from proximal to distal then retracing all steps Compression Bandaging to Lt UE as follows: Cocoa butter, TG soft, Elastomull to fingers 1-4, Artiflex x1 from wrist to axilla  left out foam at pt request, 1 6 cm to hand, 1-8 cm from wrist and  "X" at elbow, then 1-10 cm short stretch bandages from wrist to axilla   08/01/2022 Removed bandages and pts grand daughter helped her wash her arm. Checked sports bra and it does cover the lateral trunk area of swelling. Foam pad made for lower border of bra to prevent sliding Pt measured for chart notes and garments and spoke with caregivers about ordering on line to get her into garments sooner MLD to Lt UE in supine as follows: Short neck, 5 diaphragmatic breaths, Lt inguinal and Rt axillary nodes,  Lt axillo-inguinal and anterior inter-axillary anastomosis, then focused on Lt UE working from proximal to distal then retracing all steps Compression Bandaging to Lt UE as follows: Cocoa butter, TG soft, Elastomull to  fingers 1-4, Artiflex x1 from wrist to axilla with adding 1/4" gray compression foam to forearm, 1-6 cm to hand with adding 1/2" gray compression foam to dorsal hand/wrist , 1-8 cm from wrist and  "X" at elbow, then 1-10 cm short stretch bandages from wrist to axilla.  Pt fits into [employer] SECURE sleeve size M1 short, and size Small gauntlet; texted her grand daughter to have her order.   07/26/22: Removed bandages that [name] had donned for pt yesterday assessing technique while removing. Minor correction for hand bandage to have more of pattern on hand instead of wrist.  MLD to Lt UE in supine as follows: Short neck, 5 diaphragmatic breaths, Lt inguinal and Rt axillary nodes,  Lt axillo-inguinal and anterior inter-axillary anastomosis, then focused on Lt UE working from proximal to distal then retracing all steps Compression Bandaging to Lt UE as follows: Cocoa butter, TG soft, Elastomull to fingers 1-4, Artiflex x1 from wrist to axilla with adding 1/4" gray compression foam to forearm, 1-6 cm to hand with adding 1/2" gray compression foam to dorsal hand/wrist and instructing [name] while performing and reviewing correct technique, 1-8 cm from wrist and "X" at elbow, then 1-10 cm short stretch bandages from wrist to axilla.  PATIENT EDUCATION:  Education details: POC, try compression bra for trunk edema, wash sleeve every 2 days at a minimum Person educated: Patient and granddaughter [name] Education method: Explanation Education comprehension: verbalized understanding  HOME EXERCISE PROGRAM: Self MLD, post op exercises,   ASSESSMENT:  CLINICAL IMPRESSION: Pts measurements and arm are looking good. Decided to repeat SOZO and pt has improved tremendously and is now showing in the green. She is  in the routine of wearing the sleeve daily, however, they have not really been  doing MLD consistently. Patient is comfortable in the sleeve, and since she is doing well  it was decided that she remain in the sleeve and not take the chance that swelling might increase if she removed sleeve. She will follow up 1 more time before 8/19 and will be discharged if still doing well.  OBJECTIVE IMPAIRMENTS: decreased activity tolerance, decreased cognition, decreased ROM, increased edema, impaired UE functional use, postural dysfunction, and pain.   ACTIVITY LIMITATIONS: carrying, lifting, bathing, and reach over head  PARTICIPATION LIMITATIONS: meal prep, medication management, personal finances, driving, and community activity  PERSONAL FACTORS:Left Breast Cancer s/p radiation, Parkinsons, neuro cognitive disorder, OA , spinal stenosis, GAD are also affecting patient's functional outcome.   REHAB POTENTIAL: Good  CLINICAL DECISION MAKING: Stable/uncomplicated  EVALUATION COMPLEXITY: Low  GOALS: Goals reviewed with patient? Yes  SHORT TERM GOALS: Target date: 07/21/2022  Pt/family member will be educated in MLD for left UE lymphedema Baseline: Goal status: MET  2.  Pt will have left shoulder ROM equal to right for flexion and abduction Baseline:  Goal status: Ongoing  3.  Pt will have decreased tenderness left chest and trunk area Baseline:  Goal status: MET 4.  Pt will get benefit from compression bra for lateral trunk swelling Baseline:  Goal status:MET;purchased sports bra  5. Pt will be tolerant of compression bandaging  Gaol Status: MET 07/31/22  6. Pt will be fit for appropriate Compression garment for Left UE lymphedema.  GOAL STATUS : Achieved 08/14/22  7. Pts. Caregivers will be able to manage pts lymphedema with MLD, compression garments and bandaging prn to prevent it from worsening.   GOAL: New PLAN:  PT FREQUENCY: 2-3 visits  PT DURATION: 8 weeks prn to assess management.  PLANNED INTERVENTIONS: Therapeutic exercises, Patient/Family education,  Self Care, Orthotic/Fit training, Manual lymph drainage, Compression bandaging, scar mobilization, Manual therapy, and Re-evaluation  PLAN FOR NEXT SESSION:  repeat SOZO screen? Reassess goals, Measure arm, discuss how things are going; MLD, have to bandage at all?, sleeve. should be ready for DC next visit  07/24/22: Demographics sent to [facility] for compression sleeve insurance verification  Kathryne Hitch Jenner, PT 09/13/2022, 3:02 PM

## 2022-09-14 DIAGNOSIS — F02818 Dementia in other diseases classified elsewhere, unspecified severity, with other behavioral disturbance: Secondary | ICD-10-CM | POA: Diagnosis not present

## 2022-09-14 DIAGNOSIS — G20A1 Parkinson's disease without dyskinesia, without mention of fluctuations: Secondary | ICD-10-CM | POA: Diagnosis not present

## 2022-09-14 DIAGNOSIS — F32A Depression, unspecified: Secondary | ICD-10-CM | POA: Diagnosis not present

## 2022-09-14 DIAGNOSIS — M170 Bilateral primary osteoarthritis of knee: Secondary | ICD-10-CM | POA: Diagnosis not present

## 2022-09-14 DIAGNOSIS — M810 Age-related osteoporosis without current pathological fracture: Secondary | ICD-10-CM | POA: Diagnosis not present

## 2022-09-14 DIAGNOSIS — C50919 Malignant neoplasm of unspecified site of unspecified female breast: Secondary | ICD-10-CM | POA: Diagnosis not present

## 2022-09-14 DIAGNOSIS — F0283 Dementia in other diseases classified elsewhere, unspecified severity, with mood disturbance: Secondary | ICD-10-CM | POA: Diagnosis not present

## 2022-09-14 DIAGNOSIS — G309 Alzheimer's disease, unspecified: Secondary | ICD-10-CM | POA: Diagnosis not present

## 2022-09-14 DIAGNOSIS — G4700 Insomnia, unspecified: Secondary | ICD-10-CM | POA: Diagnosis not present

## 2022-09-15 IMAGING — DX DG LUMBAR SPINE 2-3V
3 series · 3 of 3 positions shown · non-contrast
Comparison: 09/09/2018

CLINICAL DATA: Low back pain

EXAM:
LUMBAR SPINE - 2-3 VIEW

[l-spine ap]
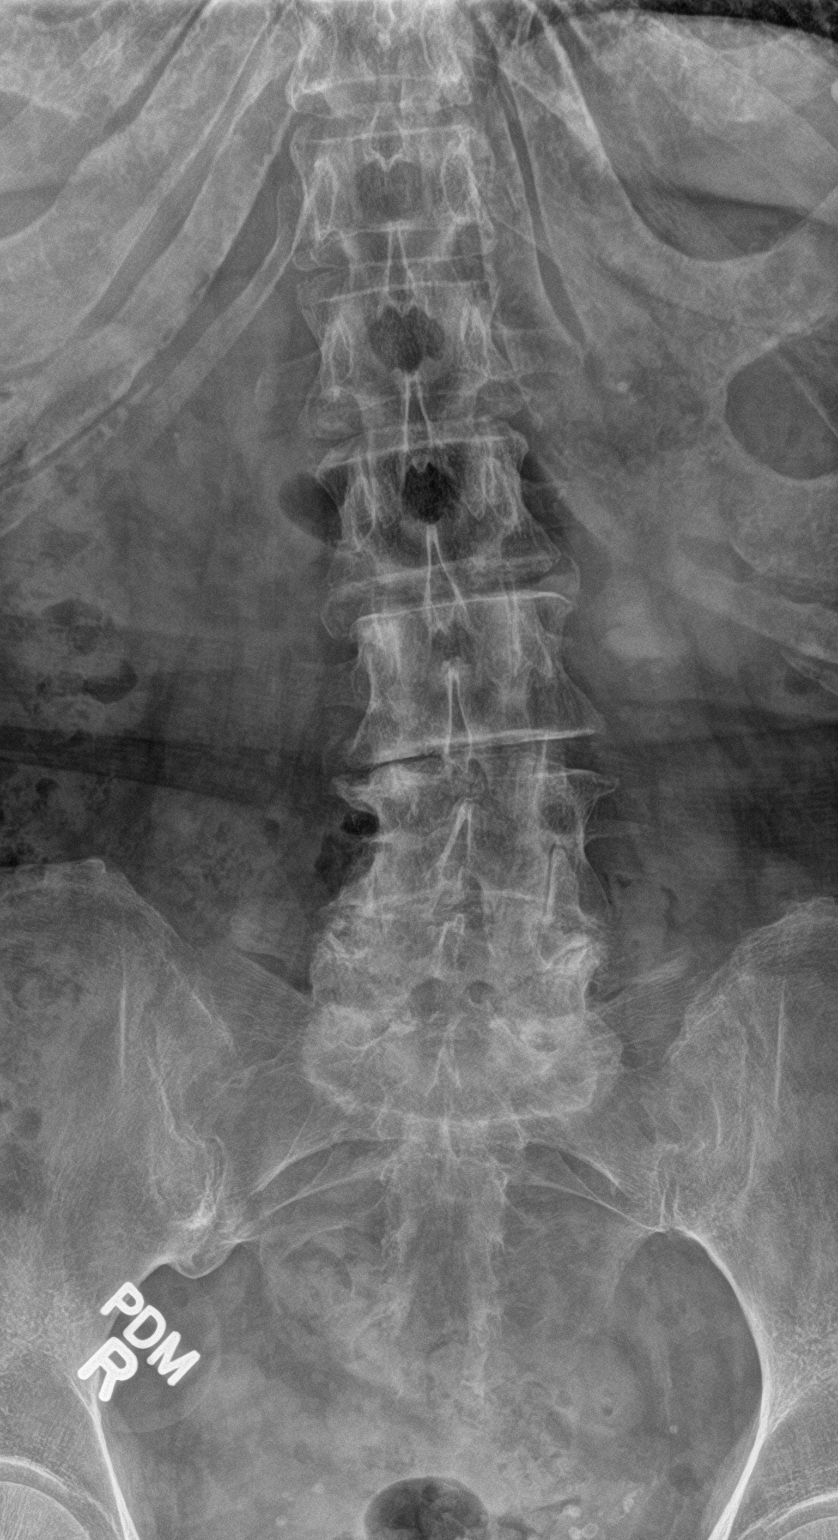

[l-spine lat]
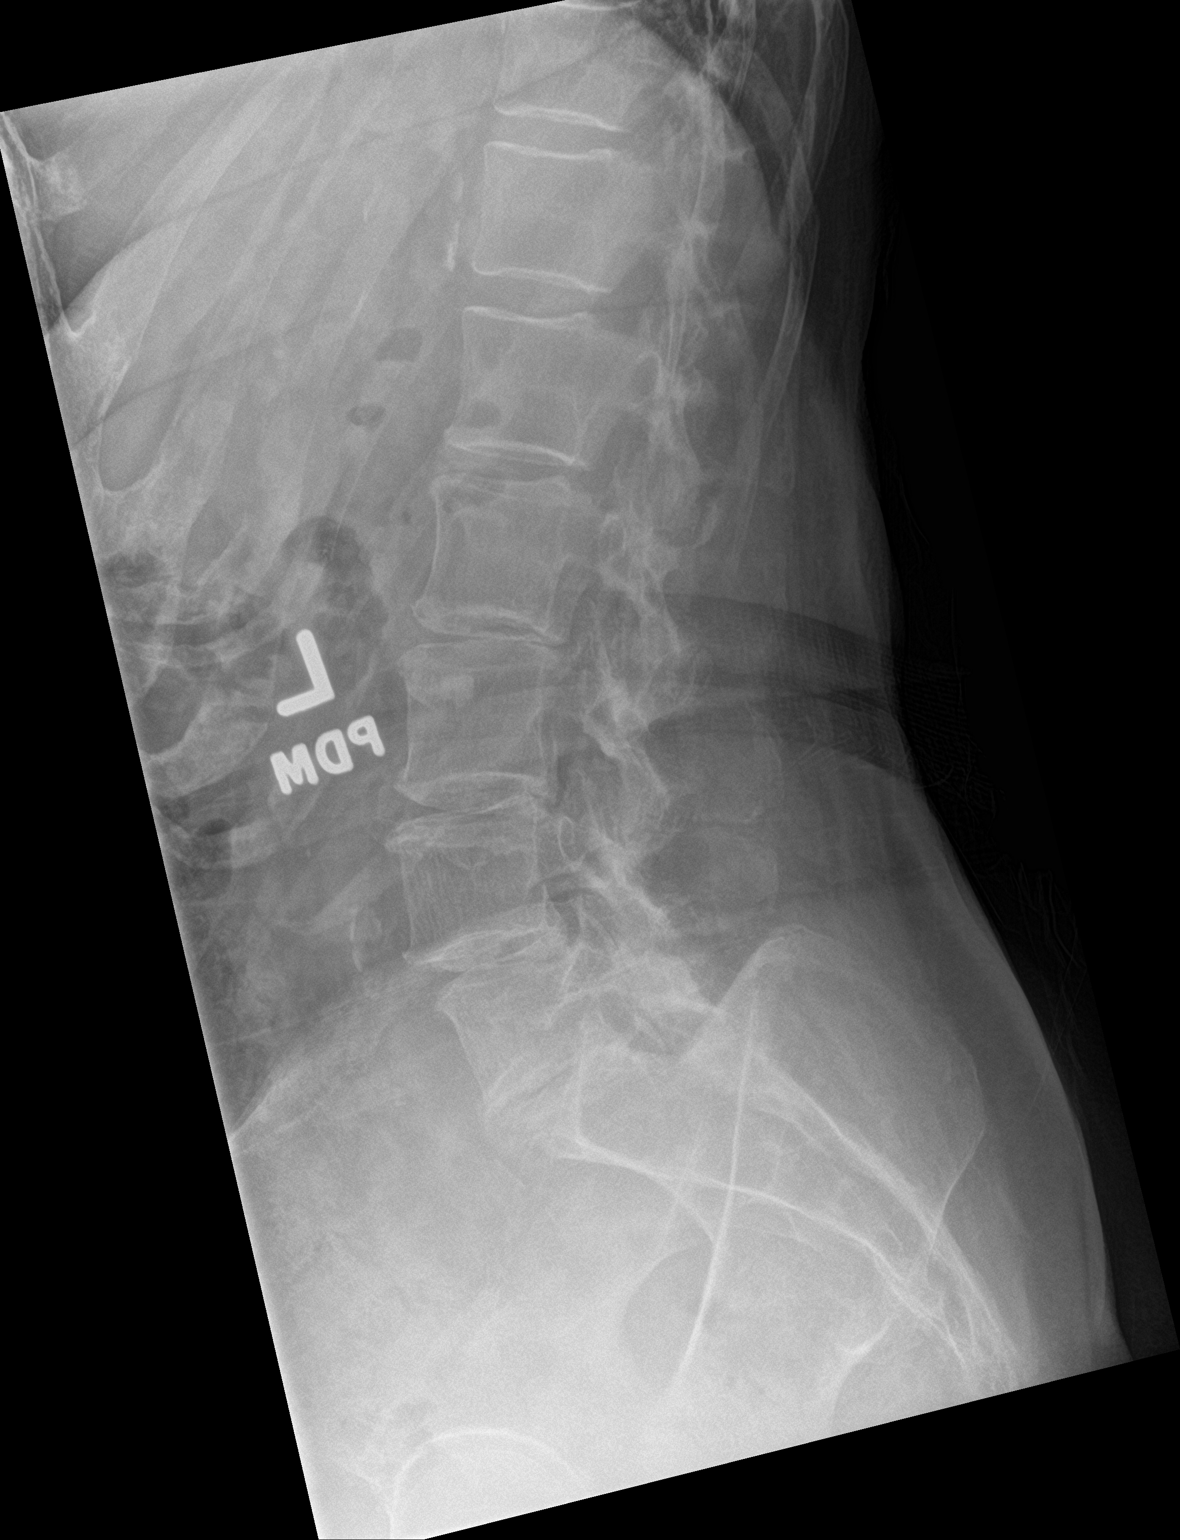

[l-spine spot]
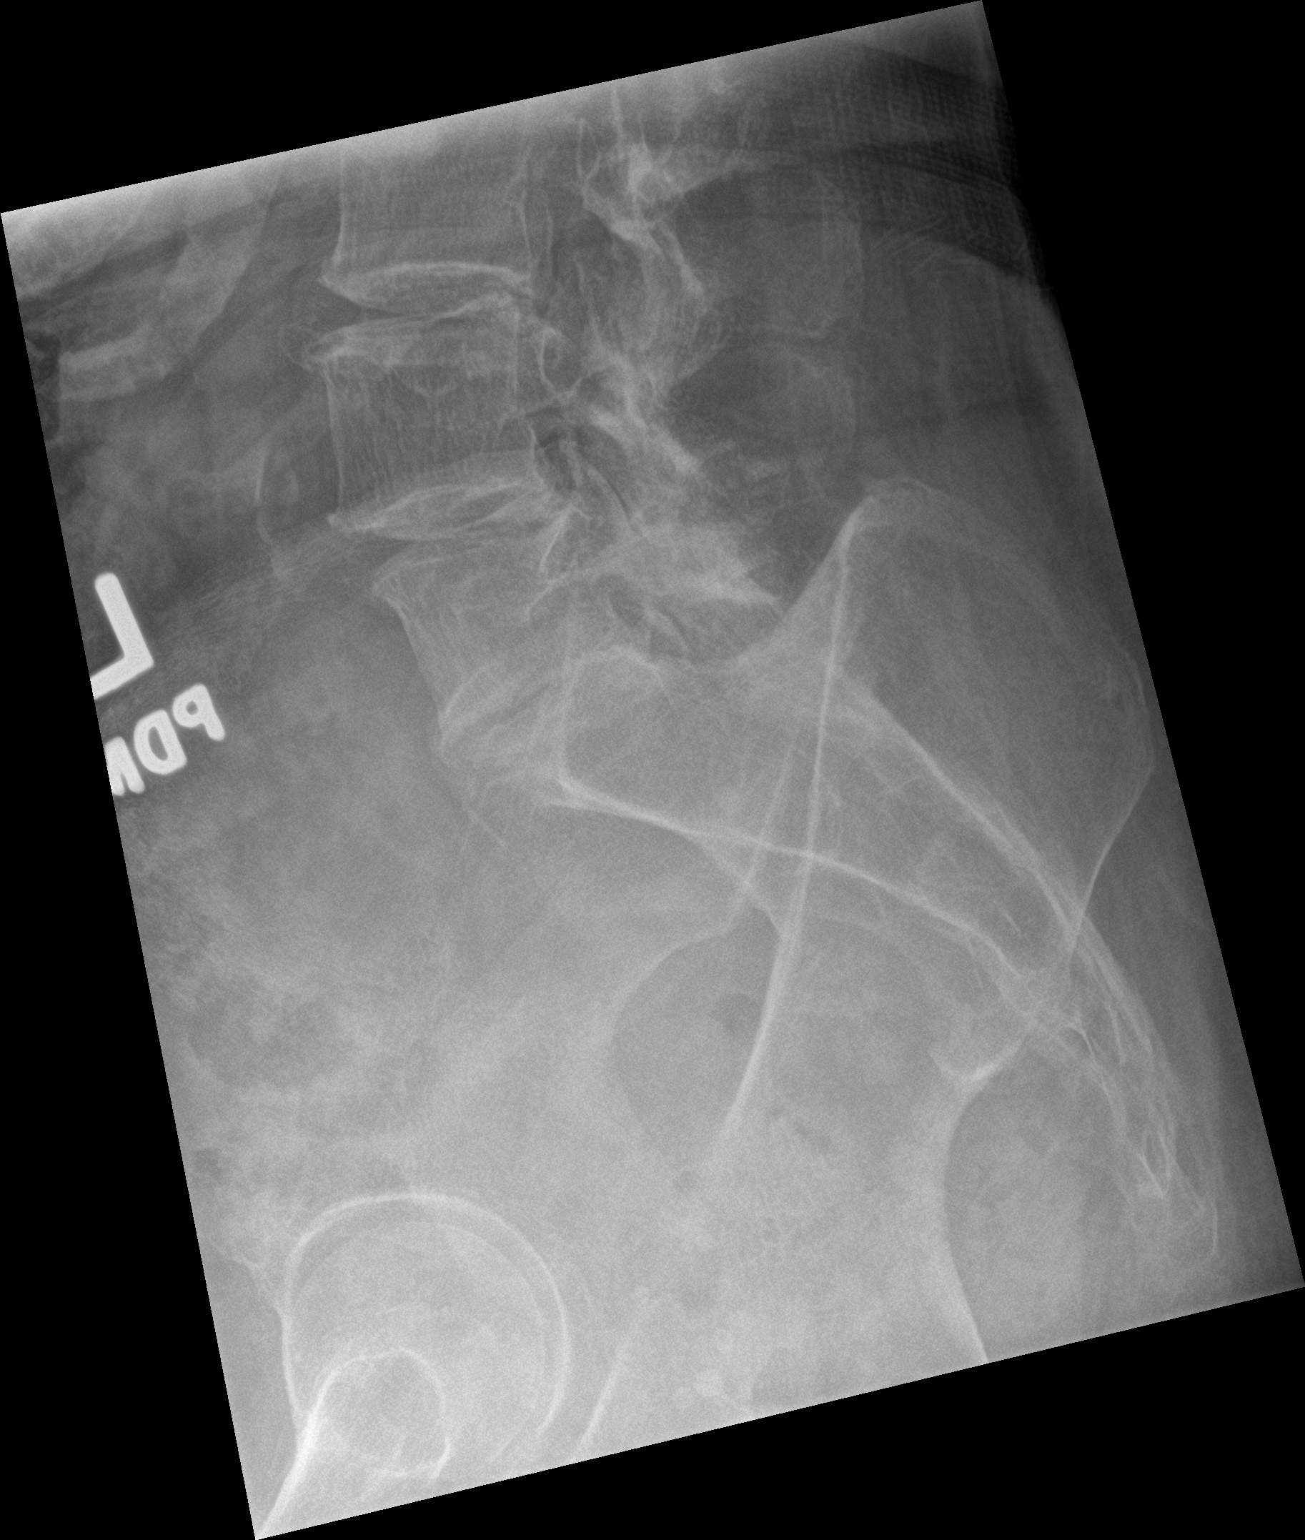

[3 of 3 positions shown; findings below may reference images not displayed]

FINDINGS: Mild levocurvature. Vertebral body heights are maintained.
Multilevel degenerative change with mild disc space narrowing at
L2-L3 and L3-L4, moderate disc space narrowing at L4-L5 and moderate
severe disc space narrowing at L5-S1. Facet degenerative changes of
the lower lumbar spine.
IMPRESSION: Levocurvature with multilevel degenerative changes similar compared
to prior

## 2022-09-15 IMAGING — DX DG HIP (WITH OR WITHOUT PELVIS) 2-3V*R*
3 series · 3 of 3 positions shown · non-contrast
Comparison: None.

CLINICAL DATA: Right hip pain.

EXAM:
DG HIP (WITH OR WITHOUT PELVIS) 2-3V RIGHT

[pelvis ap]
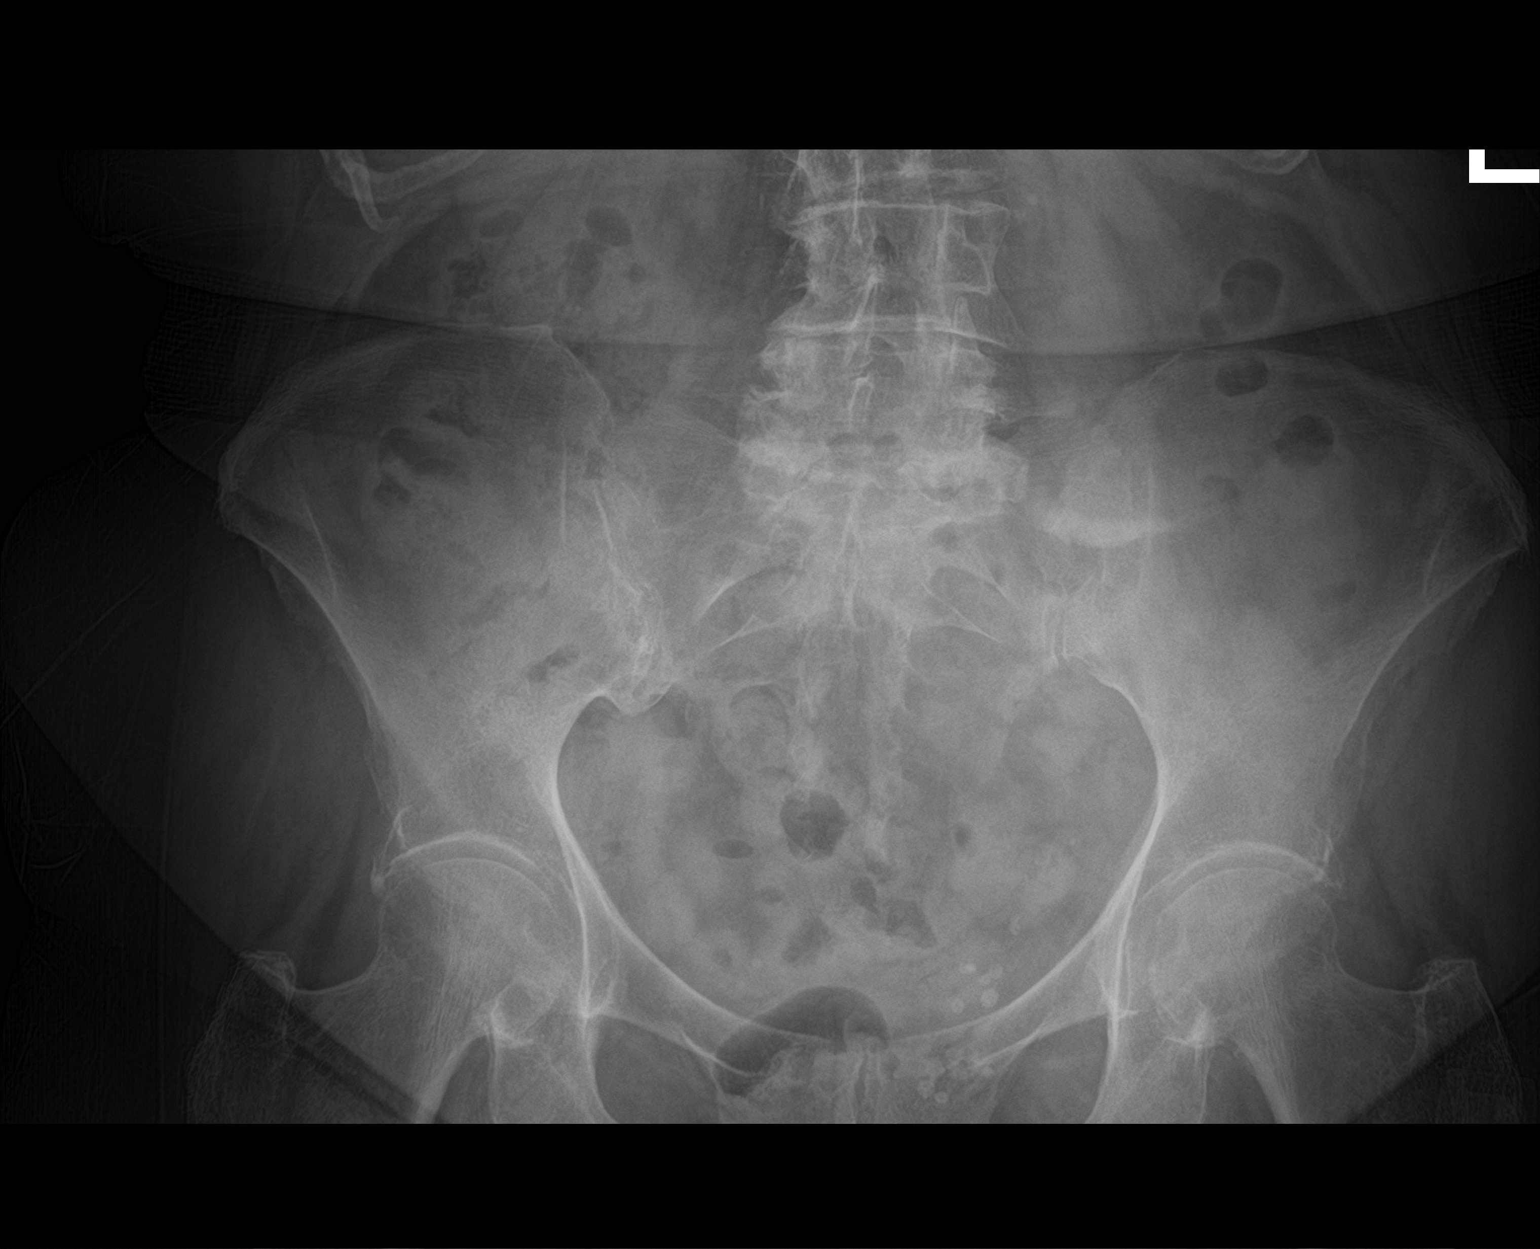

[hip ap]
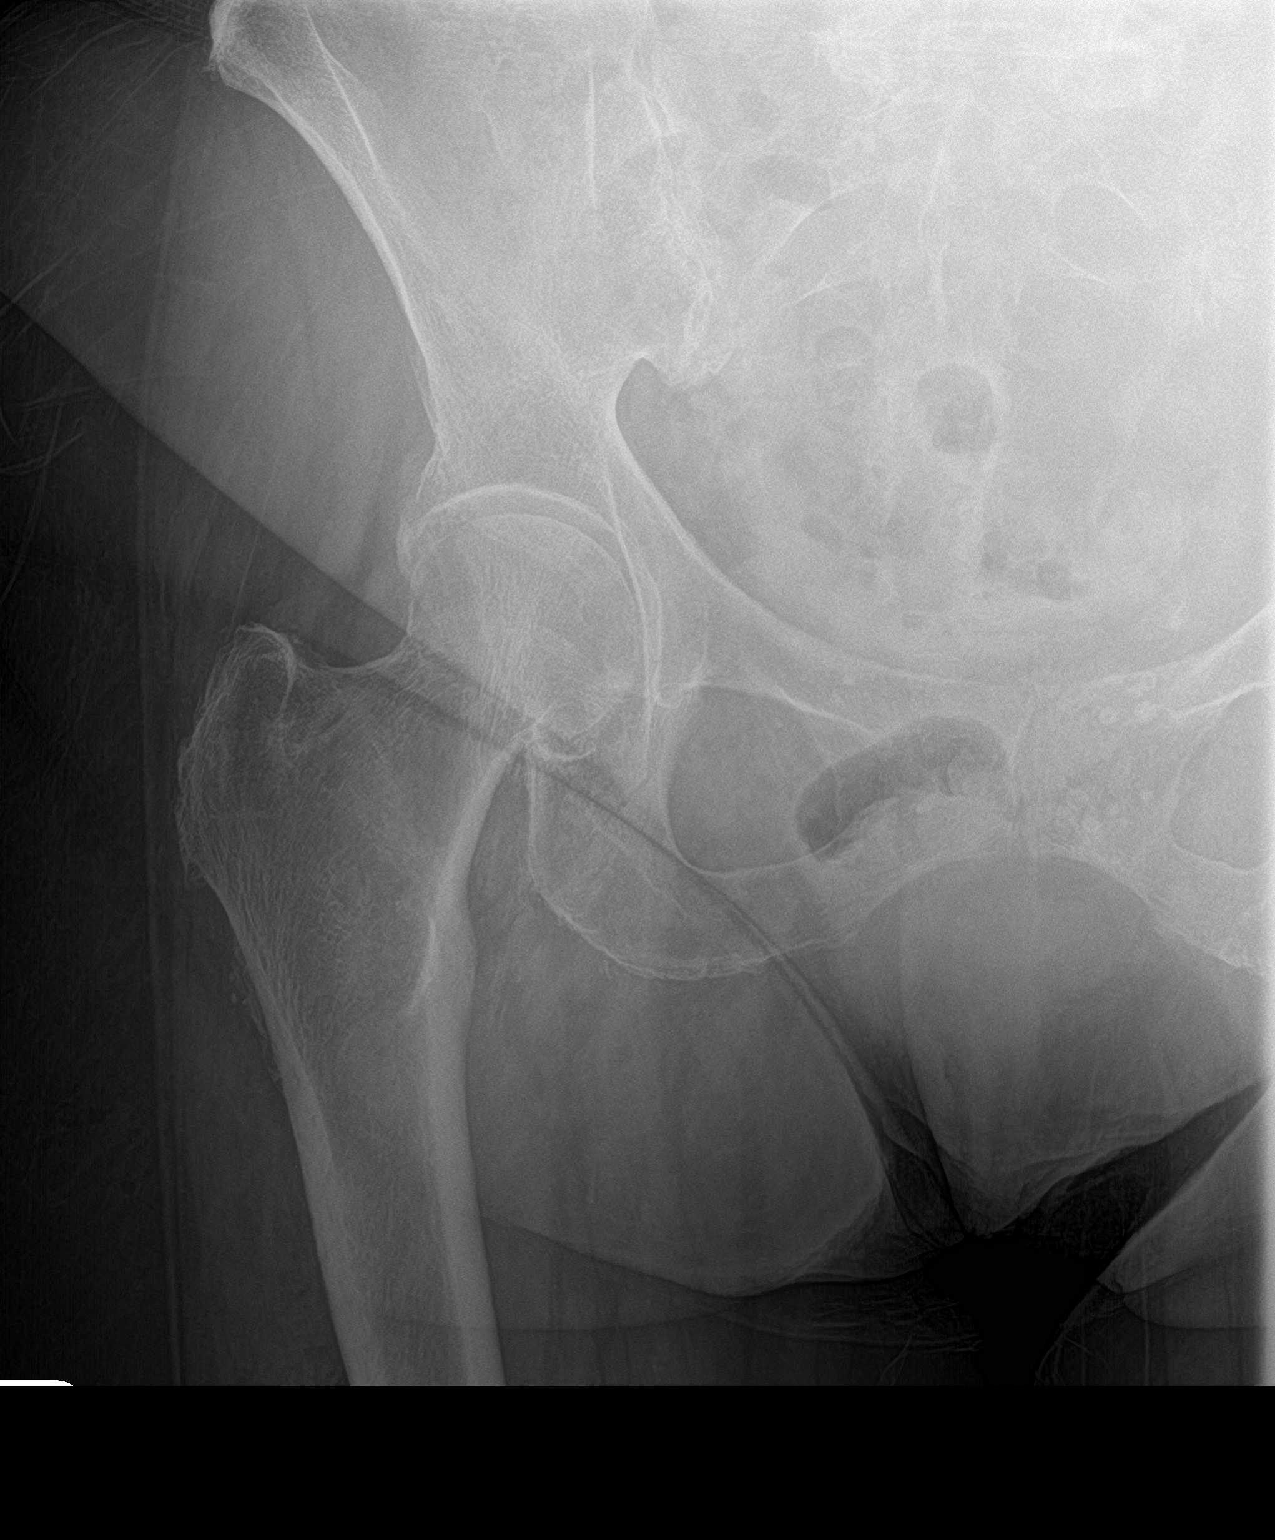

[hip lat]
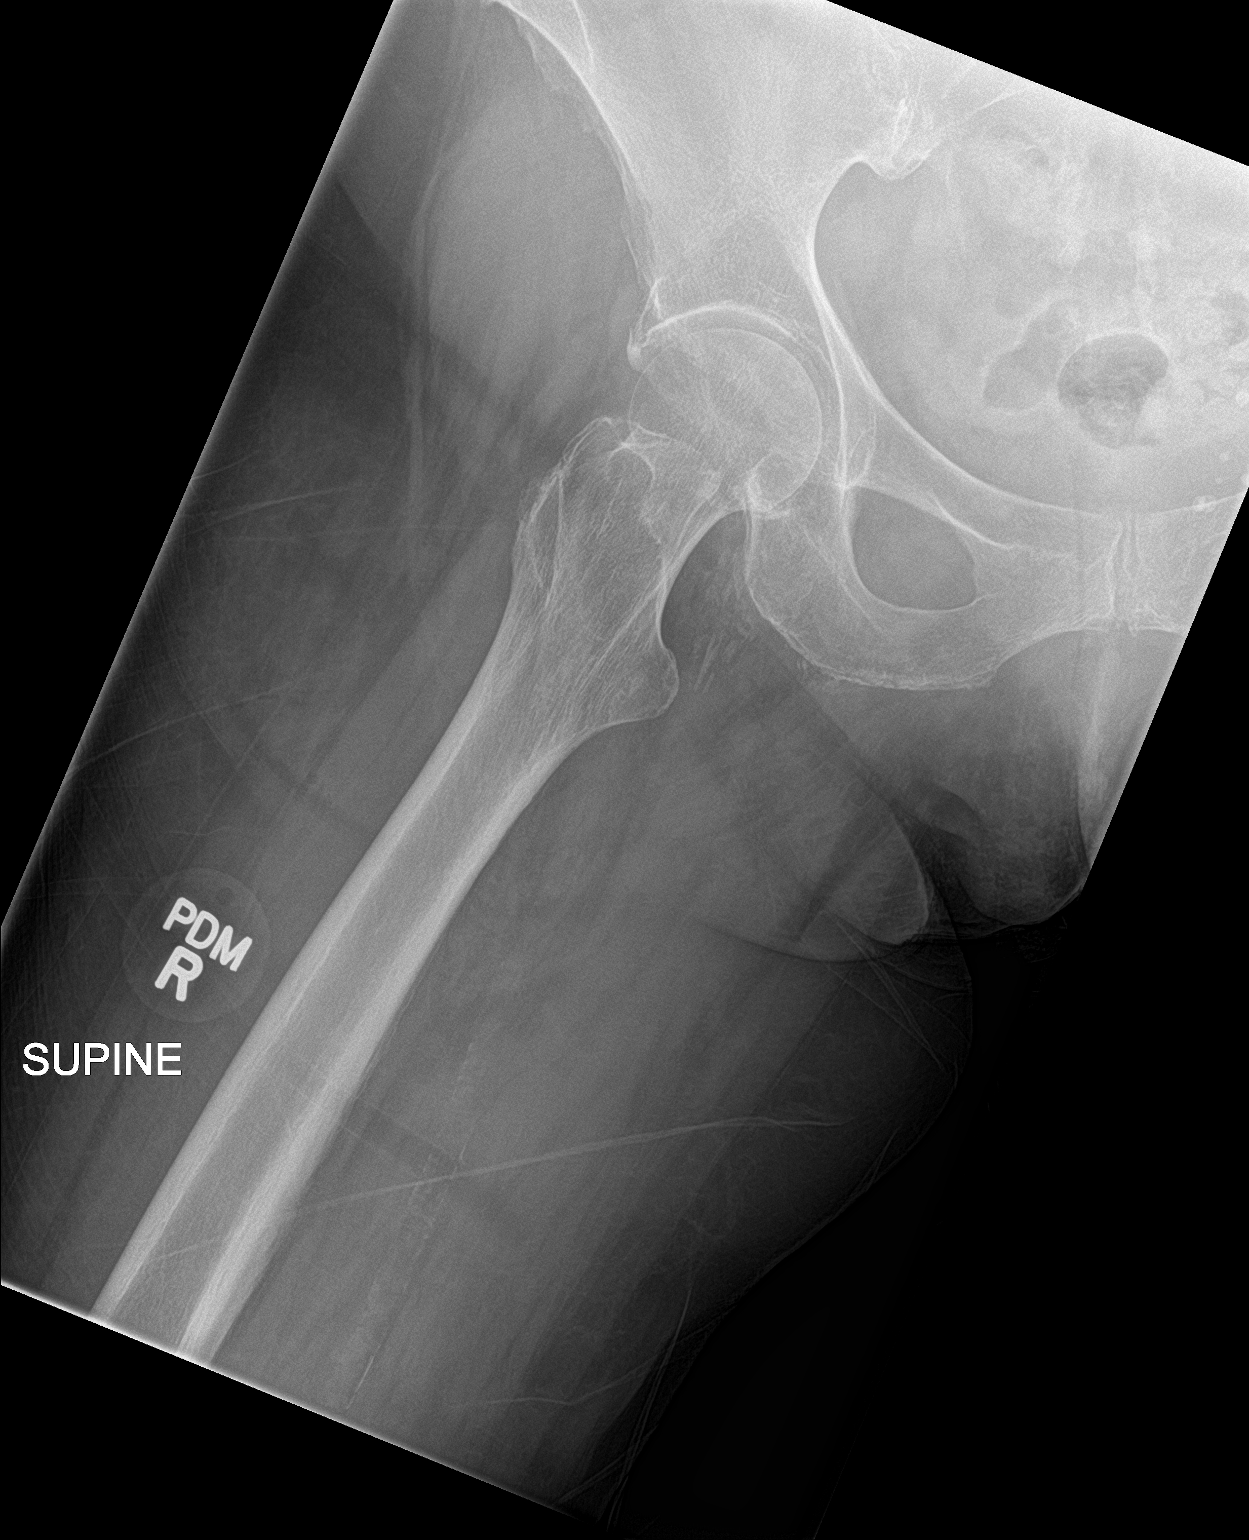

[3 of 3 positions shown; findings below may reference images not displayed]

FINDINGS: There is no evidence of hip fracture or dislocation. There is no
evidence of arthropathy or other focal bone abnormality. There is
mild heterotopic ossification surrounding the right hip.
IMPRESSION: 1. No acute bony abnormality.
2. Mild heterotopic ossification surrounding the right hip.

## 2022-09-20 ENCOUNTER — Encounter: Payer: Self-pay | Admitting: Family Medicine

## 2022-09-23 DIAGNOSIS — F02818 Dementia in other diseases classified elsewhere, unspecified severity, with other behavioral disturbance: Secondary | ICD-10-CM | POA: Diagnosis not present

## 2022-09-23 DIAGNOSIS — M170 Bilateral primary osteoarthritis of knee: Secondary | ICD-10-CM | POA: Diagnosis not present

## 2022-09-23 DIAGNOSIS — G20A1 Parkinson's disease without dyskinesia, without mention of fluctuations: Secondary | ICD-10-CM | POA: Diagnosis not present

## 2022-09-23 DIAGNOSIS — M810 Age-related osteoporosis without current pathological fracture: Secondary | ICD-10-CM | POA: Diagnosis not present

## 2022-09-23 DIAGNOSIS — C50919 Malignant neoplasm of unspecified site of unspecified female breast: Secondary | ICD-10-CM | POA: Diagnosis not present

## 2022-09-23 DIAGNOSIS — F32A Depression, unspecified: Secondary | ICD-10-CM | POA: Diagnosis not present

## 2022-09-23 DIAGNOSIS — F0283 Dementia in other diseases classified elsewhere, unspecified severity, with mood disturbance: Secondary | ICD-10-CM | POA: Diagnosis not present

## 2022-09-23 DIAGNOSIS — G309 Alzheimer's disease, unspecified: Secondary | ICD-10-CM | POA: Diagnosis not present

## 2022-09-23 DIAGNOSIS — G4700 Insomnia, unspecified: Secondary | ICD-10-CM | POA: Diagnosis not present

## 2022-09-26 DIAGNOSIS — G20A1 Parkinson's disease without dyskinesia, without mention of fluctuations: Secondary | ICD-10-CM | POA: Diagnosis not present

## 2022-09-26 DIAGNOSIS — F02818 Dementia in other diseases classified elsewhere, unspecified severity, with other behavioral disturbance: Secondary | ICD-10-CM | POA: Diagnosis not present

## 2022-09-26 DIAGNOSIS — G4700 Insomnia, unspecified: Secondary | ICD-10-CM | POA: Diagnosis not present

## 2022-09-26 DIAGNOSIS — G309 Alzheimer's disease, unspecified: Secondary | ICD-10-CM | POA: Diagnosis not present

## 2022-09-26 DIAGNOSIS — M170 Bilateral primary osteoarthritis of knee: Secondary | ICD-10-CM | POA: Diagnosis not present

## 2022-09-26 DIAGNOSIS — F32A Depression, unspecified: Secondary | ICD-10-CM | POA: Diagnosis not present

## 2022-09-26 DIAGNOSIS — M810 Age-related osteoporosis without current pathological fracture: Secondary | ICD-10-CM | POA: Diagnosis not present

## 2022-09-26 DIAGNOSIS — C50919 Malignant neoplasm of unspecified site of unspecified female breast: Secondary | ICD-10-CM | POA: Diagnosis not present

## 2022-09-26 DIAGNOSIS — F0283 Dementia in other diseases classified elsewhere, unspecified severity, with mood disturbance: Secondary | ICD-10-CM | POA: Diagnosis not present

## 2022-09-28 ENCOUNTER — Encounter: Payer: Self-pay | Admitting: Nurse Practitioner

## 2022-09-29 DIAGNOSIS — G309 Alzheimer's disease, unspecified: Secondary | ICD-10-CM | POA: Diagnosis not present

## 2022-09-29 DIAGNOSIS — F32A Depression, unspecified: Secondary | ICD-10-CM | POA: Diagnosis not present

## 2022-09-29 DIAGNOSIS — G20A1 Parkinson's disease without dyskinesia, without mention of fluctuations: Secondary | ICD-10-CM | POA: Diagnosis not present

## 2022-09-29 DIAGNOSIS — M810 Age-related osteoporosis without current pathological fracture: Secondary | ICD-10-CM | POA: Diagnosis not present

## 2022-09-29 DIAGNOSIS — C50919 Malignant neoplasm of unspecified site of unspecified female breast: Secondary | ICD-10-CM | POA: Diagnosis not present

## 2022-09-29 DIAGNOSIS — F0283 Dementia in other diseases classified elsewhere, unspecified severity, with mood disturbance: Secondary | ICD-10-CM | POA: Diagnosis not present

## 2022-09-29 DIAGNOSIS — M170 Bilateral primary osteoarthritis of knee: Secondary | ICD-10-CM | POA: Diagnosis not present

## 2022-09-29 DIAGNOSIS — F02818 Dementia in other diseases classified elsewhere, unspecified severity, with other behavioral disturbance: Secondary | ICD-10-CM | POA: Diagnosis not present

## 2022-09-29 DIAGNOSIS — G4700 Insomnia, unspecified: Secondary | ICD-10-CM | POA: Diagnosis not present

## 2022-10-02 ENCOUNTER — Telehealth: Payer: Self-pay | Admitting: Neurology

## 2022-10-02 DIAGNOSIS — G20A1 Parkinson's disease without dyskinesia, without mention of fluctuations: Secondary | ICD-10-CM | POA: Diagnosis not present

## 2022-10-02 DIAGNOSIS — F02818 Dementia in other diseases classified elsewhere, unspecified severity, with other behavioral disturbance: Secondary | ICD-10-CM | POA: Diagnosis not present

## 2022-10-02 DIAGNOSIS — M170 Bilateral primary osteoarthritis of knee: Secondary | ICD-10-CM | POA: Diagnosis not present

## 2022-10-02 DIAGNOSIS — F0283 Dementia in other diseases classified elsewhere, unspecified severity, with mood disturbance: Secondary | ICD-10-CM | POA: Diagnosis not present

## 2022-10-02 DIAGNOSIS — F32A Depression, unspecified: Secondary | ICD-10-CM | POA: Diagnosis not present

## 2022-10-02 DIAGNOSIS — G309 Alzheimer's disease, unspecified: Secondary | ICD-10-CM | POA: Diagnosis not present

## 2022-10-02 DIAGNOSIS — G4700 Insomnia, unspecified: Secondary | ICD-10-CM | POA: Diagnosis not present

## 2022-10-02 DIAGNOSIS — M810 Age-related osteoporosis without current pathological fracture: Secondary | ICD-10-CM | POA: Diagnosis not present

## 2022-10-02 DIAGNOSIS — C50919 Malignant neoplasm of unspecified site of unspecified female breast: Secondary | ICD-10-CM | POA: Diagnosis not present

## 2022-10-02 NOTE — Telephone Encounter (Signed)
FYIMarchelle [name] w/[facility] is calling to report pts BP was at 163/85 this morning and pt is not symptomatic.

## 2022-10-04 ENCOUNTER — Ambulatory Visit: Payer: Medicare PPO | Attending: Hematology and Oncology

## 2022-10-04 DIAGNOSIS — I890 Lymphedema, not elsewhere classified: Secondary | ICD-10-CM

## 2022-10-04 DIAGNOSIS — Z483 Aftercare following surgery for neoplasm: Secondary | ICD-10-CM

## 2022-10-04 DIAGNOSIS — M25612 Stiffness of left shoulder, not elsewhere classified: Secondary | ICD-10-CM

## 2022-10-04 NOTE — Therapy (Signed)
OUTPATIENT PHYSICAL THERAPY  UPPER EXTREMITY ONCOLOGY TREATMENT  Patient Name: Jamie [name] MRN: [id number] DOB:14-Dec-1934, 88 y.o., female Today's Date: 10/04/2022  END OF SESSION:  PT End of Session - 10/04/22 1101     Visit Number 18    Number of Visits 19    Date for PT Re-Evaluation 10/09/22    PT Start Time 1102    PT Stop Time 1147    PT Time Calculation (min) 45 min    Activity Tolerance Patient tolerated treatment well    Behavior During Therapy [facility] for tasks assessed/performed               Past Medical History:  Diagnosis Date   Abnormality of gait 04/18/2017   Degenerative joint disease of knee, right 03/19/2018   Injected April 18, 2018 Approved for Durolane R knee 3.4.20. Patient given another steroid injection Jun 24, 2018.  Repeat injection September 09, 2018 repeat injection February 27, 2019 repeat 6/110/20221 bilateral injections given December 01, 2019 Right knee injected April 20, 2020 monovisc May 19, 2018 Repeat steroid injection given June 16, 2020 repeat injection August 24, 2020   Essential hypertension 08/14/2016   Generalized anxiety disorder 06/27/2017   History of dizziness 06/27/2017   History of hysterectomy for benign disease 06/27/2017   History of vitamin D deficiency 08/22/2017   Leukocytes in urine 08/14/2016   Lumbar facet arthropathy 06/27/2017   Major neurocognitive disorder due to multiple etiologies 09/29/2020   Mixed hyperlipidemia 08/22/2017   Osteoarthritis of knee 09/18/2017   Pancolitis 08/14/2016   Parkinson's disease 03/13/2017   Prediabetes 08/22/2017   Right knee pain    Spinal stenosis of lumbar region    Weakness of both hips 04/18/2017   White coat syndrome without diagnosis of hypertension 08/22/2017   Past Surgical History:  Procedure Laterality Date   HYSTERECTOMY ABDOMINAL WITH SALPINGECTOMY     MASTECTOMY W/ SENTINEL NODE BIOPSY Left 08/18/2021   Procedure: LEFT MASTECTOMY WITH TARGETED LYMPH NODE DISSECTION;   Surgeon: Abigail Miyamoto, MD;  Location: [facility];  Service: General;  Laterality: Left;   TONSILLECTOMY     TOTAL ABDOMINAL HYSTERECTOMY W/ BILATERAL SALPINGOOPHORECTOMY     Patient Active Problem List   Diagnosis Date Noted   Osteoarthritis, hip, bilateral 09/07/2022   Lymphedema of left arm 09/07/2022   Gastroesophageal reflux disease without esophagitis 03/19/2022   Heartburn 03/19/2022   S/P left mastectomy 08/18/2021   Genetic testing 07/25/2021   Family history of breast cancer 07/14/2021   Malignant neoplasm of lower-inner quadrant of left breast in female, estrogen receptor positive (HCC) 07/11/2021   Mild episode of recurrent major depressive disorder (HCC) 04/06/2021   Age-related osteoporosis without current pathological fracture 04/06/2021   Greater trochanteric bursitis, left 01/21/2021   Major neurocognitive disorder due to multiple etiologies 09/29/2020   Right knee pain 05/07/2018   Osteoarthritis of knees, bilateral 03/19/2018   Osteoarthritis of knee 09/18/2017   Mixed hyperlipidemia 08/22/2017   Prediabetes 08/22/2017   White coat syndrome without diagnosis of hypertension 08/22/2017   History of vitamin D deficiency 08/22/2017   Lumbar facet arthropathy 06/27/2017   History of hysterectomy for benign disease 06/27/2017   Generalized anxiety disorder 06/27/2017   History of dizziness 06/27/2017   Other fatigue 06/27/2017   Overweight (BMI 25.0-29.9) 06/27/2017   Weakness of both hips 04/18/2017   Abnormality of gait 04/18/2017   Spinal stenosis of lumbar region 04/18/2017   Parkinson's disease 03/13/2017   Pancolitis 08/14/2016  Essential hypertension 08/14/2016   Leukocytes in urine 08/14/2016       REFERRING PROVIDER: Serena Croissant MD  REFERRING DIAG: Left UE lymphedema  THERAPY DIAG:  Lymphedema, not elsewhere classified  Aftercare following surgery for neoplasm  Stiffness of left shoulder, not elsewhere  classified  ONSET DATE: 06/12/2022  Rationale for Evaluation and Treatment: Rehabilitation  SUBJECTIVE:                                                                                                                                                                                           SUBJECTIVE STATEMENT:  I am feeling a little weak today. Arm is doing well and I get help with the sleeve. No body has been doing the MLD   PERTINENT HISTORY:  Patient was diagnosed on 06/27/2021 with left grade 2 invasive ductal carcinoma breast cancer. She underwent a left mastectomy and targeted axillary lymph node dissection (7/7 nodes positive) on 08/18/2021. It is triple positive with a Ki67 of 40%. She had radiation. Patient has cognitive deficits effecting memory and was diagnosed with Parkinson's in 2020   PAIN:  Are you having pain? No   PRECAUTIONS: Left UE lymphedema, Parkinsons, neuro cognitive disorder, OA , spinal stenosis, GAD  WEIGHT BEARING RESTRICTIONS: No  FALLS:  Has patient fallen in last 6 months? No  LIVING ENVIRONMENT: Lives with: lives alone, but has a family member with her every night, and they cook dinner for her Lives in: House/apartment Stairs: Yes; External: 4 steps; can reach both Has following equipment at home: Single point cane, Walker - 2 wheeled, and Grab bars  OCCUPATION: NA  LEISURE: read,  HAND DOMINANCE: right   PRIOR LEVEL OF FUNCTION: Independent with basic ADLs  PATIENT GOALS: Learn how to manage swelling   OBJECTIVE:  COGNITION: Overall cognitive status: Impaired   PALPATION: Tender left chest and left lateral trunk  OBSERVATIONS / OTHER ASSESSMENTS: mild redness left prox arm, swelling visible and palpable at left lateral trunk  SENSATION: Light touch: Appears intact    POSTURE: forward head, rounded shoulders  UPPER EXTREMITY AROM/PROM:  A/PROM RIGHT   eval   Shoulder extension   Shoulder flexion 127  Shoulder abduction 124   Shoulder internal rotation   Shoulder external rotation     (Blank rows = not tested)  A/PROM LEFT   eval Left 07/05/22 LEFT 09/13/2022  Shoulder extension     Shoulder flexion 126 pulls at ribs 130 pulls at ribs, 138 after P/ROM 125  Shoulder abduction 115 118, 125 after P/ROM 125  Shoulder internal rotation     Shoulder external rotation       (  Blank rows = not tested)  CERVICAL AROM: All within functional limits:  UPPER EXTREMITY STRENGTH:   LYMPHEDEMA ASSESSMENTS:  SURGERY TYPE/DATE: 08/18/21 Left Mastectomy with NUMBER OF LYMPH NODES REMOVED: 7/7 CHEMOTHERAPY: NO RADIATION:Yes, completed 11/09/2021 HORMONE TREATMENT: Letrozole INFECTIONS: NO Herceptin infusions will end May 14  LYMPHEDEMA ASSESSMENTS:   LANDMARK RIGHT  eval RIGHT 07/21/2022 Right 08/01/22  At axilla  34.5 33.4 34.1  15 cm proximal to olecranon process 33.8 32.9   10 cm proximal to olecranon process 31.5 31.5 31.3  Olecranon process 26.5 26.0 26.2  15 cm proximal to ulnar styloid process 25.4 25.4   10 cm proximal to ulnar styloid process 22.8 23.3 22.7  Just proximal to ulnar styloid process 16 16.05 16.1  Across hand at thumb web space 18.1 18.1 17.4  At base of 2nd digit 6.0 6.0 6.0  (Blank rows = not tested)  LANDMARK LEFT  eval LEFT 07/21/22 LEFT 07/24/22 LEFT 08/01/2022 LEFT 09/13/2022 LEFT 10/04/2022  At axilla  33.8 32.8 33 32 33 33  15 cm proximal to olecranon process 32.4 31.8 30.3 31.0 30.3 31  10  cm proximal to olecranon process 31.8 31.1 30.4 31.8 30.8 30.7  Olecranon process 25.8 24.6 25 25.1 24.9 24.9  15 cm proximal to ulnar styloid process 25 24.8 25.3 25 24.6 24.4  10 cm proximal to ulnar styloid process 22.4 22.7 23 22.7 22.3 22.0  Just proximal to ulnar styloid process 16.1 16.1 15.6 15.9 15.7 15.7  Across hand at thumb web space 18.1 17.9 17.4 17.7 17.3 17.1  At base of 2nd digit 6.0 6.0 5.9 5.8 5.9 6.0  (Blank rows = not tested)   L-DEX LYMPHEDEMA SCREENING: The patient  was assessed using the L-Dex machine today to produce a lymphedema index baseline score. The patient will be reassessed on a regular basis (typically every 3 months) to obtain new L-Dex scores. If the score is > 6.5 points away from his/her baseline score indicating onset of subclinical lymphedema, it will be recommended to wear a compression garment for 4 weeks, 12 hours per day and then be reassessed. If the score continues to be > 6.5 points from baseline at reassessment, we will initiate lymphedema treatment. Assessing in this manner has a 95% rate of preventing clinically significant lymphedema.  LLIS: 6  TODAY'S TREATMENT:  10/04/2022 Sleeve and gauntlet removed Pts arm visually assessed and measured and continues to do well. Sleeve does need to be washed to renew stretch. Assisted pt to table MLD to Lt UE in supine as follows: Short neck, 5 diaphragmatic breaths, Lt inguinal and Rt axillary nodes,  Lt axillo-inguinal and anterior inter-axillary anastomosis, then focused on Lt UE working from proximal to distal then retracing all steps. Educated granddaughter to instruct caregiver in MLD if possible AA shoulder flexion x 5 with VC to pt, and grand daughter. Also suggested she continue stargazer stretch.   09/13/2022 Removed pt garments and visually assessed and measured pts arm.  Measurements improved from previous visits. Performed SOZO;first attempt did not read Right foot, so performed again and pt is now in the green. Discussed decreasing sleeve wear due to improvement placing pt in the green, but after looking at trends decided it best to remain in the sleeve as pt says she can wear it comfortably without any concerns and they have gotten in a routine with it.. Measured AROM left shoulder. Pt now has minimal pain, but continues with decreased left shoulder ROM. Reminded pt about her supine flexion AROM  exercises. Pts. Son donned compression sleeve and gauntlet  08/14/22 SOZO screen  performed ; 16.4 above, but down slightly from previous screen Showed grand daughter and son how to use Arion EZ slide leg to don UE sleeve.  She did very well getting sleeve on well and able to pull slide out properly without sleeve falling down. Grand daughter then donned gauntlet. Sleeve and gauntlet seem to fit very well. Advised them to have pt wear several hours and to check fingers to be sure she is not swelling. Sleeve and gauntlet seem to fit well and are comfortable for pt. Discussed measuring pt 1x per week to track swelling using a spot in the forearm and a spot in the upper arm, but being consistent where it is measured.reminded them that sleeve and glove only meant to be worn during the day time, never at night.  If arm does swell she may need to be wrapped for a day or 2 before going back to the sleeve.Discussed laundering every few days to keep sleeve snug. Showed family members Juzo foot slipee and Arion leg Slip-EZ on internet [name] is considering showing her grand mothers caregiver how to perform MLD to free her up some. Will follow up with pt 1x in a month to reassess     08/07/2022 Removed bandages and pts grand daughter helped her wash her arm. PROM left shoulder to restore mobility MLD to Lt UE in supine as follows: Short neck, 5 diaphragmatic breaths, Lt inguinal and Rt axillary nodes,  Lt axillo-inguinal and anterior inter-axillary anastomosis, then focused on Lt UE working from proximal to distal then retracing all steps Compression Bandaging to Lt UE as follows: Cocoa butter, TG soft, Elastomull to fingers 1-4, Artiflex x1 from wrist to axilla  left out foam at pt request, 1 6 cm to hand, 1-8 cm from wrist and  "X" at elbow, then 1-10 cm short stretch bandages from wrist to axilla   08/01/2022 Removed bandages and pts grand daughter helped her wash her arm. Checked sports bra and it does cover the lateral trunk area of swelling. Foam pad made for lower border of bra to  prevent sliding Pt measured for chart notes and garments and spoke with caregivers about ordering on line to get her into garments sooner MLD to Lt UE in supine as follows: Short neck, 5 diaphragmatic breaths, Lt inguinal and Rt axillary nodes,  Lt axillo-inguinal and anterior inter-axillary anastomosis, then focused on Lt UE working from proximal to distal then retracing all steps Compression Bandaging to Lt UE as follows: Cocoa butter, TG soft, Elastomull to fingers 1-4, Artiflex x1 from wrist to axilla with adding 1/4" gray compression foam to forearm, 1-6 cm to hand with adding 1/2" gray compression foam to dorsal hand/wrist , 1-8 cm from wrist and  "X" at elbow, then 1-10 cm short stretch bandages from wrist to axilla.  Pt fits into [employer] SECURE sleeve size M1 short, and size Small gauntlet; texted her grand daughter to have her order.   07/26/22: Removed bandages that [name] had donned for pt yesterday assessing technique while removing. Minor correction for hand bandage to have more of pattern on hand instead of wrist.  MLD to Lt UE in supine as follows: Short neck, 5 diaphragmatic breaths, Lt inguinal and Rt axillary nodes,  Lt axillo-inguinal and anterior inter-axillary anastomosis, then focused on Lt UE working from proximal to distal then retracing all steps Compression Bandaging to Lt UE as follows: Cocoa butter, TG soft,  Elastomull to fingers 1-4, Artiflex x1 from wrist to axilla with adding 1/4" gray compression foam to forearm, 1-6 cm to hand with adding 1/2" gray compression foam to dorsal hand/wrist and instructing [name] while performing and reviewing correct technique, 1-8 cm from wrist and "X" at elbow, then 1-10 cm short stretch bandages from wrist to axilla.                                                                                                                                      PATIENT EDUCATION:  Education details: POC, try compression bra for trunk edema, wash sleeve  every 2 days at a minimum Person educated: Patient and granddaughter [name] Education method: Explanation Education comprehension: verbalized understanding  HOME EXERCISE PROGRAM: Self MLD, post op exercises,   ASSESSMENT:  CLINICAL IMPRESSION: Pts arm is doing very well throughout. Caregiver did not get sleeve quite high enough but otherwise they are doing well with management of Lymphedema. Grand daughter [name] will try and instruct caregiver in MLD techniques  OBJECTIVE IMPAIRMENTS: decreased activity tolerance, decreased cognition, decreased ROM, increased edema, impaired UE functional use, postural dysfunction, and pain.   ACTIVITY LIMITATIONS: carrying, lifting, bathing, and reach over head  PARTICIPATION LIMITATIONS: meal prep, medication management, personal finances, driving, and community activity  PERSONAL FACTORS:Left Breast Cancer s/p radiation, Parkinsons, neuro cognitive disorder, OA , spinal stenosis, GAD are also affecting patient's functional outcome.   REHAB POTENTIAL: Good  CLINICAL DECISION MAKING: Stable/uncomplicated  EVALUATION COMPLEXITY: Low  GOALS: Goals reviewed with patient? Yes  SHORT TERM GOALS: Target date: 07/21/2022  Pt/family member will be educated in MLD for left UE lymphedema Baseline: Goal status: MET  2.  Pt will have left shoulder ROM equal to right for flexion and abduction Baseline:  Goal status: NOT MET  3.  Pt will have decreased tenderness left chest and trunk area Baseline:  Goal status: MET 4.  Pt will get benefit from compression bra for lateral trunk swelling Baseline:  Goal status:MET;purchased sports bra  5. Pt will be tolerant of compression bandaging  Gaol Status: MET 07/31/22  6. Pt will be fit for appropriate Compression garment for Left UE lymphedema.  GOAL STATUS : Achieved 08/14/22  7. Pts. Caregivers will be able to manage pts lymphedema with MLD, compression garments and bandaging prn to prevent it from  worsening.   GOAL: MET PLAN:  PT FREQUENCY: 2-3 visits  PT DURATION: 8 weeks prn to assess management.  PLANNED INTERVENTIONS: Therapeutic exercises, Patient/Family education, Self Care, Orthotic/Fit training, Manual lymph drainage, Compression bandaging, scar mobilization, Manual therapy, and Re-evaluation  PLAN FOR NEXT SESSION:  Pt discharged; Family is independent in pts care of UE lymphedema. Her arm is doing well and she is compliant with garment wear. PHYSICAL THERAPY DISCHARGE SUMMARY  Visits from Start of Care: 18  Current functional level related to goals / functional outcomes: Achieved all goals except shoulder ROM  Remaining deficits: Mild left Shoulder limitation continues   Education / Equipment: Compression garments, MLD, ongoing care requirements   Patient agrees to discharge. Patient goals were  nearly all met . Patient is being discharged due to meeting most rehab goals. Lacking a small degree of left shoulder ROM  Waynette Buttery, PT 10/04/2022, 11:54 AM

## 2022-10-05 DIAGNOSIS — D225 Melanocytic nevi of trunk: Secondary | ICD-10-CM | POA: Diagnosis not present

## 2022-10-05 DIAGNOSIS — L814 Other melanin hyperpigmentation: Secondary | ICD-10-CM | POA: Diagnosis not present

## 2022-10-05 DIAGNOSIS — L570 Actinic keratosis: Secondary | ICD-10-CM | POA: Diagnosis not present

## 2022-10-05 DIAGNOSIS — L821 Other seborrheic keratosis: Secondary | ICD-10-CM | POA: Diagnosis not present

## 2022-10-05 DIAGNOSIS — Z85828 Personal history of other malignant neoplasm of skin: Secondary | ICD-10-CM | POA: Diagnosis not present

## 2022-10-05 DIAGNOSIS — L718 Other rosacea: Secondary | ICD-10-CM | POA: Diagnosis not present

## 2022-10-05 DIAGNOSIS — D2262 Melanocytic nevi of left upper limb, including shoulder: Secondary | ICD-10-CM | POA: Diagnosis not present

## 2022-10-09 DIAGNOSIS — G4700 Insomnia, unspecified: Secondary | ICD-10-CM | POA: Diagnosis not present

## 2022-10-09 DIAGNOSIS — F32A Depression, unspecified: Secondary | ICD-10-CM | POA: Diagnosis not present

## 2022-10-09 DIAGNOSIS — M810 Age-related osteoporosis without current pathological fracture: Secondary | ICD-10-CM | POA: Diagnosis not present

## 2022-10-09 DIAGNOSIS — F02818 Dementia in other diseases classified elsewhere, unspecified severity, with other behavioral disturbance: Secondary | ICD-10-CM | POA: Diagnosis not present

## 2022-10-09 DIAGNOSIS — M170 Bilateral primary osteoarthritis of knee: Secondary | ICD-10-CM | POA: Diagnosis not present

## 2022-10-09 DIAGNOSIS — G20A1 Parkinson's disease without dyskinesia, without mention of fluctuations: Secondary | ICD-10-CM | POA: Diagnosis not present

## 2022-10-09 DIAGNOSIS — C50919 Malignant neoplasm of unspecified site of unspecified female breast: Secondary | ICD-10-CM | POA: Diagnosis not present

## 2022-10-09 DIAGNOSIS — F0283 Dementia in other diseases classified elsewhere, unspecified severity, with mood disturbance: Secondary | ICD-10-CM | POA: Diagnosis not present

## 2022-10-09 DIAGNOSIS — G309 Alzheimer's disease, unspecified: Secondary | ICD-10-CM | POA: Diagnosis not present

## 2022-10-13 DIAGNOSIS — G20A1 Parkinson's disease without dyskinesia, without mention of fluctuations: Secondary | ICD-10-CM | POA: Diagnosis not present

## 2022-10-16 ENCOUNTER — Other Ambulatory Visit: Payer: Self-pay | Admitting: Neurology

## 2022-10-16 DIAGNOSIS — G20A1 Parkinson's disease without dyskinesia, without mention of fluctuations: Secondary | ICD-10-CM | POA: Diagnosis not present

## 2022-10-16 DIAGNOSIS — M170 Bilateral primary osteoarthritis of knee: Secondary | ICD-10-CM | POA: Diagnosis not present

## 2022-10-16 DIAGNOSIS — G4700 Insomnia, unspecified: Secondary | ICD-10-CM | POA: Diagnosis not present

## 2022-10-16 DIAGNOSIS — F0283 Dementia in other diseases classified elsewhere, unspecified severity, with mood disturbance: Secondary | ICD-10-CM | POA: Diagnosis not present

## 2022-10-16 DIAGNOSIS — C50919 Malignant neoplasm of unspecified site of unspecified female breast: Secondary | ICD-10-CM | POA: Diagnosis not present

## 2022-10-16 DIAGNOSIS — G309 Alzheimer's disease, unspecified: Secondary | ICD-10-CM | POA: Diagnosis not present

## 2022-10-16 DIAGNOSIS — F02818 Dementia in other diseases classified elsewhere, unspecified severity, with other behavioral disturbance: Secondary | ICD-10-CM | POA: Diagnosis not present

## 2022-10-16 DIAGNOSIS — M810 Age-related osteoporosis without current pathological fracture: Secondary | ICD-10-CM | POA: Diagnosis not present

## 2022-10-16 DIAGNOSIS — F32A Depression, unspecified: Secondary | ICD-10-CM | POA: Diagnosis not present

## 2022-10-17 ENCOUNTER — Encounter: Payer: Self-pay | Admitting: Hematology and Oncology

## 2022-10-25 ENCOUNTER — Other Ambulatory Visit: Payer: Self-pay

## 2022-10-25 DIAGNOSIS — R7303 Prediabetes: Secondary | ICD-10-CM

## 2022-10-25 DIAGNOSIS — R140 Abdominal distension (gaseous): Secondary | ICD-10-CM

## 2022-10-25 DIAGNOSIS — I10 Essential (primary) hypertension: Secondary | ICD-10-CM

## 2022-10-25 DIAGNOSIS — E782 Mixed hyperlipidemia: Secondary | ICD-10-CM

## 2022-10-26 DIAGNOSIS — F0283 Dementia in other diseases classified elsewhere, unspecified severity, with mood disturbance: Secondary | ICD-10-CM | POA: Diagnosis not present

## 2022-10-26 DIAGNOSIS — F02818 Dementia in other diseases classified elsewhere, unspecified severity, with other behavioral disturbance: Secondary | ICD-10-CM | POA: Diagnosis not present

## 2022-10-26 DIAGNOSIS — G20A1 Parkinson's disease without dyskinesia, without mention of fluctuations: Secondary | ICD-10-CM | POA: Diagnosis not present

## 2022-10-26 DIAGNOSIS — C50919 Malignant neoplasm of unspecified site of unspecified female breast: Secondary | ICD-10-CM | POA: Diagnosis not present

## 2022-10-26 DIAGNOSIS — M170 Bilateral primary osteoarthritis of knee: Secondary | ICD-10-CM | POA: Diagnosis not present

## 2022-10-26 DIAGNOSIS — G309 Alzheimer's disease, unspecified: Secondary | ICD-10-CM | POA: Diagnosis not present

## 2022-10-26 DIAGNOSIS — M810 Age-related osteoporosis without current pathological fracture: Secondary | ICD-10-CM | POA: Diagnosis not present

## 2022-10-26 DIAGNOSIS — F32A Depression, unspecified: Secondary | ICD-10-CM | POA: Diagnosis not present

## 2022-10-26 DIAGNOSIS — G4700 Insomnia, unspecified: Secondary | ICD-10-CM | POA: Diagnosis not present

## 2022-10-27 ENCOUNTER — Ambulatory Visit
Admission: RE | Admit: 2022-10-27 | Discharge: 2022-10-27 | Disposition: A | Discharge status: Home / Self Care | Payer: Medicare PPO | Source: Ambulatory Visit | Attending: Hematology and Oncology

## 2022-10-27 DIAGNOSIS — Z1231 Encounter for screening mammogram for malignant neoplasm of breast: Secondary | ICD-10-CM | POA: Diagnosis not present

## 2022-10-27 DIAGNOSIS — C50312 Malignant neoplasm of lower-inner quadrant of left female breast: Secondary | ICD-10-CM

## 2022-10-27 HISTORY — DX: Malignant neoplasm of unspecified site of unspecified female breast: C50.919

## 2022-10-27 HISTORY — DX: Personal history of antineoplastic chemotherapy: Z92.21

## 2022-11-01 ENCOUNTER — Other Ambulatory Visit: Payer: Medicare PPO

## 2022-11-01 DIAGNOSIS — R140 Abdominal distension (gaseous): Secondary | ICD-10-CM

## 2022-11-01 DIAGNOSIS — I10 Essential (primary) hypertension: Secondary | ICD-10-CM

## 2022-11-01 DIAGNOSIS — E782 Mixed hyperlipidemia: Secondary | ICD-10-CM | POA: Diagnosis not present

## 2022-11-01 DIAGNOSIS — R7303 Prediabetes: Secondary | ICD-10-CM

## 2022-11-02 ENCOUNTER — Encounter: Payer: Self-pay | Admitting: Hematology and Oncology

## 2022-11-02 ENCOUNTER — Other Ambulatory Visit: Payer: Medicare PPO

## 2022-11-02 LAB — COMPREHENSIVE METABOLIC PANEL
ALT: 7 IU/L (ref 0–32)
AST: 20 IU/L (ref 0–40)
Albumin: 4.4 g/dL (ref 3.7–4.7)
Alkaline Phosphatase: 98 IU/L (ref 44–121)
BUN/Creatinine Ratio: 23 (ref 12–28)
BUN: 17 mg/dL (ref 8–27)
Bilirubin Total: 0.4 mg/dL (ref 0.0–1.2)
CO2: 21 mmol/L (ref 20–29)
Calcium: 10.7 mg/dL — ABNORMAL HIGH (ref 8.7–10.3)
Chloride: 106 mmol/L (ref 96–106)
Creatinine, Ser: 0.75 mg/dL (ref 0.57–1.00)
Globulin, Total: 2.3 g/dL (ref 1.5–4.5)
Glucose: 109 mg/dL — ABNORMAL HIGH (ref 70–99)
Potassium: 4.5 mmol/L (ref 3.5–5.2)
Sodium: 139 mmol/L (ref 134–144)
Total Protein: 6.7 g/dL (ref 6.0–8.5)
eGFR: 77 mL/min/{1.73_m2} (ref >59)

## 2022-11-02 LAB — CBC WITH DIFFERENTIAL/PLATELET
Basophils Absolute: 0.1 10*3/uL (ref 0.0–0.2)
Basos: 1 %
EOS (ABSOLUTE): 0.1 10*3/uL (ref 0.0–0.4)
Eos: 3 %
Hematocrit: 40.6 % (ref 34.0–46.6)
Hemoglobin: 13.5 g/dL (ref 11.1–15.9)
Immature Grans (Abs): 0 10*3/uL (ref 0.0–0.1)
Immature Granulocytes: 0 %
Lymphocytes Absolute: 1.1 10*3/uL (ref 0.7–3.1)
Lymphs: 23 %
MCH: 31.5 pg (ref 26.6–33.0)
MCHC: 33.3 g/dL (ref 31.5–35.7)
MCV: 95 fL (ref 79–97)
Monocytes Absolute: 0.5 10*3/uL (ref 0.1–0.9)
Monocytes: 10 %
Neutrophils Absolute: 3.1 10*3/uL (ref 1.4–7.0)
Neutrophils: 63 %
Platelets: 213 10*3/uL (ref 150–450)
RBC: 4.28 x10E6/uL (ref 3.77–5.28)
RDW: 13.3 % (ref 11.7–15.4)
WBC: 4.8 10*3/uL (ref 3.4–10.8)

## 2022-11-02 LAB — LIPID PANEL
Chol/HDL Ratio: 2.5 ratio (ref 0.0–4.4)
Cholesterol, Total: 183 mg/dL (ref 100–199)
HDL: 72 mg/dL (ref >39)
LDL Chol Calc (NIH): 91 mg/dL (ref 0–99)
Triglycerides: 117 mg/dL (ref 0–149)
VLDL Cholesterol Cal: 20 mg/dL (ref 5–40)

## 2022-11-02 LAB — HEMOGLOBIN A1C
Est. average glucose Bld gHb Est-mCnc: 146 mg/dL
Hgb A1c MFr Bld: 6.7 % — ABNORMAL HIGH (ref 4.8–5.6)

## 2022-11-02 LAB — TSH: TSH: 3.06 u[IU]/mL (ref 0.450–4.500)

## 2022-11-07 NOTE — Progress Notes (Unsigned)
[name] [address] [place] [address] Phone: [phone] Subjective:   Jamie [name], am serving as a scribe for Dr. Antoine Primas. I'm seeing this patient by the request  of:  [name], PA  CC: knee pain follow up   [id number]  08/08/2022  Worsening symptoms on the left side. Has had this in the past. Daily activities. Discussed which activities to do and which ones to avoid. Increase activity slowly. Follow-up again in 6-8   Patient does have more rigidity noted.  More shuffling gait.  More masked facies.  Does seem to be progressing somewhat more than last time I saw patient.     Chronic problem with worsening symptoms.  Patient is having more difficulty and is going to be going to formal physical therapy which I think will be beneficial for the balance and coordination.  Discussed which activities to do and which ones to avoid.  Discussed with patient about icing regimen and home exercises.  Discussed continuing to stay active which will be beneficial.  Can do viscosupplementation if needed.  Follow-up with me again in 3 months otherwise.   Updated 11/08/2022 Jamie [name] is a 88 y.o. female coming in with complaint of knee pain follow-up.  No significant arthritic changes.  Also known spinal stenosis in the lumbar spine.  Patient states that she would like injections today. Pain is worse with movement.        Past Medical History:  Diagnosis Date   Abnormality of gait 04/18/2017   Breast cancer (HCC)    Left breast mastectomy.   Degenerative joint disease of knee, right 03/19/2018   Injected April 18, 2018 Approved for Durolane R knee 3.4.20. Patient given another steroid injection Jun 24, 2018.  Repeat injection September 09, 2018 repeat injection February 27, 2019 repeat 6/110/20221 bilateral injections given December 01, 2019 Right knee injected April 20, 2020 monovisc May 19, 2018 Repeat steroid injection given June 16, 2020 repeat injection August 24, 2020   Essential hypertension 08/14/2016   Generalized anxiety disorder 06/27/2017   History of dizziness 06/27/2017   History of hysterectomy for benign disease 06/27/2017   History of vitamin D deficiency 08/22/2017   Leukocytes in urine 08/14/2016   Lumbar facet arthropathy 06/27/2017   Major neurocognitive disorder due to multiple etiologies 09/29/2020   Mixed hyperlipidemia 08/22/2017   Osteoarthritis of knee 09/18/2017   Pancolitis 08/14/2016   Parkinson's disease 03/13/2017   Personal history of chemotherapy    Prediabetes 08/22/2017   Right knee pain    Spinal stenosis of lumbar region    Weakness of both hips 04/18/2017   White coat syndrome without diagnosis of hypertension 08/22/2017   Past Surgical History:  Procedure Laterality Date   BREAST BIOPSY Left    06/2021   HYSTERECTOMY ABDOMINAL WITH SALPINGECTOMY     MASTECTOMY W/ SENTINEL NODE BIOPSY Left 08/18/2021   Procedure: LEFT MASTECTOMY WITH TARGETED LYMPH NODE DISSECTION;  Surgeon: Abigail Miyamoto, MD;  Location: [facility];  Service: General;  Laterality: Left;   TONSILLECTOMY     TOTAL ABDOMINAL HYSTERECTOMY W/ BILATERAL SALPINGOOPHORECTOMY     Social History   Socioeconomic History   Marital status: Widowed    Spouse name: Not on file   Number of children: 3   Years of education: 12   Highest education level: High school graduate  Occupational History   Not on file  Tobacco Use   Smoking status: Never  Passive exposure: Never   Smokeless tobacco: Never  Vaping Use   Vaping status: Never Used  Substance and Sexual Activity   Alcohol use: No    Alcohol/week: 0.0 standard drinks of alcohol   Drug use: No   Sexual activity: Not Currently    Birth control/protection: Surgical  Other Topics Concern   Not on file  Social History Narrative   Pt lives alone in her 1 story home- she has 3 sons, and a high school graduate. She is right handed, she drinks  coffee, and one soda a week mostly water   Social Determinants of Health   Financial Resource Strain: Low Risk  (09/07/2022)   Overall Financial Resource Strain (CARDIA)    Difficulty of Paying Living Expenses: Not hard at all  Food Insecurity: No Food Insecurity (09/07/2022)   Hunger Vital Sign    Worried About Running Out of Food in the Last Year: Never true    Ran Out of Food in the Last Year: Never true  Transportation Needs: No Transportation Needs (09/07/2022)   PRAPARE - Administrator, Civil Service (Medical): No    Lack of Transportation (Non-Medical): No  Physical Activity: Insufficiently Active (09/07/2022)   Exercise Vital Sign    Days of Exercise per Week: 2 days    Minutes of Exercise per Session: 30 min  Stress: No Stress Concern Present (09/07/2022)   [employer] of Occupational Health - Occupational Stress Questionnaire    Feeling of Stress : Not at all  Social Connections: Moderately Integrated (09/07/2022)   Social Connection and Isolation Panel [NHANES]    Frequency of Communication with Friends and Family: More than three times a week    Frequency of Social Gatherings with Friends and Family: More than three times a week    Attends Religious Services: More than 4 times per year    Active Member of [employer] or Organizations: Yes    Attends Banker Meetings: More than 4 times per year    Marital Status: Widowed   Allergies  Allergen Reactions   Flagyl [Metronidazole] Rash    Possible cause of rash    Family History  Problem Relation Age of Onset   Breast cancer Mother 4   Melanoma Father        dx. 81s   Lung cancer Father        dx. 7s   Healthy Son      Current Outpatient Medications (Cardiovascular):    rosuvastatin (CRESTOR) 5 MG tablet, Take 1 tablet (5 mg total) by mouth at bedtime.    Current Outpatient Medications (Hematological):    cyanocobalamin (VITAMIN B12) 500 MCG tablet,   Current Outpatient Medications  (Other):    Biotin 1 MG CAPS,    carbidopa-levodopa (SINEMET CR) 50-200 MG tablet, Take 1 tablet by mouth at bedtime.   carbidopa-levodopa (SINEMET IR) 25-100 MG tablet, TAKE 2 TABLETS BY MOUTH 3 TIMES DAILY.   cholecalciferol (VITAMIN D3) 25 MCG (1000 UNIT) tablet, Take 2,000 Units by mouth daily.   donepezil (ARICEPT) 10 MG tablet, Take 1 tablet (10 mg total) by mouth in the morning.   letrozole (FEMARA) 2.5 MG tablet, Take 1 tablet (2.5 mg total) by mouth daily.   mirtazapine (REMERON) 15 MG tablet, Take 1 tablet (15 mg total) by mouth at bedtime.   Multiple Vitamins-Minerals (PRESERVISION AREDS 2 PO), Take 1 tablet by mouth in the morning and at bedtime.    NON FORMULARY, Herblax as needed  NON FORMULARY, Cholesterol Reduction   omeprazole (PRILOSEC) 20 MG capsule, TAKE 1 CAPSULE BY MOUTH DAILY AT 5:30 PM   Vitamin E 100 units TABS, Take 250 mg by mouth. Once a day   Reviewed prior external information including notes and imaging from  primary care provider As well as notes that were available from care everywhere and other healthcare systems.  Past medical history, social, surgical and family history all reviewed in electronic medical record.  No pertanent information unless stated regarding to the chief complaint.   Review of Systems:  No headache, visual changes, nausea, vomiting, diarrhea, constipation, dizziness, abdominal pain, skin rash, fevers, chills, night sweats, weight loss, swollen lymph nodes, body aches, joint swelling, chest pain, shortness of breath, mood changes. POSITIVE muscle aches  Objective  Blood pressure 128/88, pulse 83, height 5\' 8"  (1.727 m), weight 180 lb (81.6 kg), SpO2 91%.   General: No apparent distress alert and oriented x3 mood and affect normal, dressed appropriately.  Mild masked facies. HEENT: Pupils equal, extraocular movements intact  Respiratory: Patient's speak in full sentences and does not appear short of breath  Cardiovascular: No lower  extremity edema, non tender, no erythema  And both knees do have crepitus noted.  Some instability with valgus and varus force noted. Patient does have a mild shuffling gait  After informed written and verbal consent, patient was seated on exam table. Right knee was prepped with alcohol swab and utilizing anterolateral approach, patient's right knee space was injected with 4:1  marcaine 0.5%: Kenalog 40mg /dL. Patient tolerated the procedure well without immediate complications.  After informed written and verbal consent, patient was seated on exam table. Left knee was prepped with alcohol swab and utilizing anterolateral approach, patient's left knee space was injected with 4:1  marcaine 0.5%: Kenalog 40mg /dL. Patient tolerated the procedure well without immediate complications.   Impression and Recommendations:     The above documentation has been reviewed and is accurate and complete Judi Saa, DO

## 2022-11-08 ENCOUNTER — Encounter: Payer: Self-pay | Admitting: Family Medicine

## 2022-11-08 ENCOUNTER — Ambulatory Visit: Payer: Medicare PPO | Admitting: Family Medicine

## 2022-11-08 VITALS — BP 128/88 | HR 83 | Ht 68.0 in | Wt 180.0 lb

## 2022-11-08 DIAGNOSIS — M170 Bilateral primary osteoarthritis of knee: Secondary | ICD-10-CM

## 2022-11-08 NOTE — Patient Instructions (Signed)
Injected both knees See me again in 3 months

## 2022-11-09 ENCOUNTER — Ambulatory Visit: Payer: Medicare PPO | Admitting: Family Medicine

## 2022-11-09 ENCOUNTER — Encounter: Payer: Self-pay | Admitting: Family Medicine

## 2022-11-09 VITALS — BP 120/74 | HR 78 | Resp 18 | Ht 68.0 in | Wt 178.0 lb

## 2022-11-09 DIAGNOSIS — E119 Type 2 diabetes mellitus without complications: Secondary | ICD-10-CM | POA: Diagnosis not present

## 2022-11-09 DIAGNOSIS — I10 Essential (primary) hypertension: Secondary | ICD-10-CM

## 2022-11-09 DIAGNOSIS — E1169 Type 2 diabetes mellitus with other specified complication: Secondary | ICD-10-CM

## 2022-11-09 DIAGNOSIS — Z23 Encounter for immunization: Secondary | ICD-10-CM | POA: Diagnosis not present

## 2022-11-09 DIAGNOSIS — F330 Major depressive disorder, recurrent, mild: Secondary | ICD-10-CM | POA: Diagnosis not present

## 2022-11-09 DIAGNOSIS — E782 Mixed hyperlipidemia: Secondary | ICD-10-CM

## 2022-11-09 MED ORDER — MIRTAZAPINE 15 MG PO TABS
15.0000 mg | ORAL_TABLET | Freq: Every day | ORAL | 1 refills | Status: DC
Start: 2022-11-09 — End: 2023-05-09

## 2022-11-09 MED ORDER — ROSUVASTATIN CALCIUM 5 MG PO TABS: 5.0000 mg | ORAL_TABLET | Freq: Every day | ORAL | 3 refills | Status: DC

## 2022-11-09 NOTE — Assessment & Plan Note (Signed)
A1c recently increased to 6.7 meeting criteria for diagnosis of type 2 diabetes mellitis.  We discussed what this means and annual healthcare for anyone with diabetes.  Discussed that goal will be to keep A1c less than 7.0.  Encouraged to cut back significantly on sugar and carbs.  Provided handout to review at home with additional information about diabetes.  Already taking statin medication.  Discussed that she will need a diabetic eye exam to look for changes in the retina.  Will collect urine microalbumin at next appointment and perform foot exam at that time.

## 2022-11-09 NOTE — Progress Notes (Signed)
Established Patient Office Visit  Subjective   Patient ID: Jamie [name], female    DOB: Nov 30, 1934  Age: 88 y.o. MRN: [id number]  Chief Complaint  Patient presents with   Hypertension   Prediabetes    HPI Jamie [name] is a 88 y.o. female presenting today with her daughter for follow up of hypertension, hyperlipidemia, prediabetes. Hypertension: Patient here for follow-up of elevated blood pressure. Pt denies chest pain, SOB, dizziness, edema, syncope, fatigue or heart palpitations.  She is not currently taking any medications.  She has been working with [facility] on PT at home, and they noted a couple of high blood pressures and encouraged ambulatory blood pressure monitoring.  Patient brought blood pressure log today, blood pressures are not consistent at all ranging from around 99/54 to 152/96. Hyperlipidemia: tolerating rosuvastatin well with no myalgias or significant side effects.  The ASCVD Risk score (Arnett DK, et al., 2019) failed to calculate for the following reasons:   The 2019 ASCVD risk score is only valid for ages 53 to 18 Prediabetes: denies hypoglycemic events, wounds or sores that are not healing well, increased thirst or urination.  She does eat something sweet after every meal and has ice cream every night.  Outpatient Medications Prior to Visit  Medication Sig   Biotin 1 MG CAPS    carbidopa-levodopa (SINEMET CR) 50-200 MG tablet Take 1 tablet by mouth at bedtime.   carbidopa-levodopa (SINEMET IR) 25-100 MG tablet TAKE 2 TABLETS BY MOUTH 3 TIMES DAILY.   cholecalciferol (VITAMIN D3) 25 MCG (1000 UNIT) tablet Take 2,000 Units by mouth daily.   cyanocobalamin (VITAMIN B12) 500 MCG tablet    donepezil (ARICEPT) 10 MG tablet Take 1 tablet (10 mg total) by mouth in the morning.   letrozole (FEMARA) 2.5 MG tablet Take 1 tablet (2.5 mg total) by mouth daily.   Multiple Vitamins-Minerals (PRESERVISION AREDS 2 PO) Take 1 tablet by mouth in the morning and at  bedtime.    NON FORMULARY Herblax as needed   NON FORMULARY Cholesterol Reduction   omeprazole (PRILOSEC) 20 MG capsule TAKE 1 CAPSULE BY MOUTH DAILY AT 5:30 PM   Vitamin E 100 units TABS Take 250 mg by mouth. Once a day   [DISCONTINUED] mirtazapine (REMERON) 15 MG tablet Take 1 tablet (15 mg total) by mouth at bedtime.   [DISCONTINUED] rosuvastatin (CRESTOR) 5 MG tablet Take 1 tablet (5 mg total) by mouth at bedtime.   No facility-administered medications prior to visit.    ROS Negative unless otherwise noted in HPI   Objective:     BP 120/74 Comment: home BP  Pulse 78   Resp 18   Ht 5\' 8"  (1.727 m)   Wt 178 lb (80.7 kg)   SpO2 98%   BMI 27.06 kg/m   Physical Exam Constitutional:      General: She is not in acute distress.    Appearance: Normal appearance.  HENT:     Head: Normocephalic and atraumatic.  Cardiovascular:     Rate and Rhythm: Normal rate and regular rhythm.     Heart sounds: No murmur heard.    No friction rub. No gallop.  Pulmonary:     Effort: Pulmonary effort is normal. No respiratory distress.     Breath sounds: No wheezing, rhonchi or rales.  Skin:    General: Skin is warm and dry.  Neurological:     Mental Status: She is alert and oriented to person, place, and time.  Assessment & Plan:  Essential hypertension Assessment & Plan: Blood pressure is very inconsistent at home.  Elevated in office, patient does have diagnosis of whitecoat syndrome as well.  Encouraged to continue ambulatory blood pressure log.  Will review at follow-up appointment.  Right now, hesitant to start medication since some blood pressures were very low.  Encouraged increased hydration to eliminate any variables that would be contributing to those low blood pressures.   Mixed diabetic hyperlipidemia associated with type 2 diabetes mellitus (HCC) Assessment & Plan: Last lipid panel: LDL 91, HDL 72, triglycerides 117.  Continue rosuvastatin 5 mg daily, will continue to  monitor.  CMP within normal limits with the exception of mild hypercalcemia.  Orders: -     Rosuvastatin Calcium; Take 1 tablet (5 mg total) by mouth at bedtime.  Dispense: 90 tablet; Refill: 3  Hypercalcemia Assessment & Plan: Improving, calcium remains at 10.7 with corrected calcium 10.4, still slightly hypercalcemic.  Encouraged to increase water intake, will continue to monitor.   Type 2 diabetes mellitus without complication, without long-term current use of insulin (HCC) Assessment & Plan: A1c recently increased to 6.7 meeting criteria for diagnosis of type 2 diabetes mellitis.  We discussed what this means and annual healthcare for anyone with diabetes.  Discussed that goal will be to keep A1c less than 7.0.  Encouraged to cut back significantly on sugar and carbs.  Provided handout to review at home with additional information about diabetes.  Already taking statin medication.  Discussed that she will need a diabetic eye exam to look for changes in the retina.  Will collect urine microalbumin at next appointment and perform foot exam at that time.   Mild episode of recurrent major depressive disorder (HCC) -     Mirtazapine; Take 1 tablet (15 mg total) by mouth at bedtime.  Dispense: 90 tablet; Refill: 1  Need for influenza vaccination -     Flu Vaccine Trivalent High Dose (Fluad)    Return in about 3 months (around 02/08/2023) for follow-up for HTN, DM, POC A1C and urine microalbumin that day.    [name], PA

## 2022-11-09 NOTE — Assessment & Plan Note (Addendum)
Improving, calcium remains at 10.7 with corrected calcium 10.4, still slightly hypercalcemic.  Encouraged to increase water intake, will continue to monitor.

## 2022-11-09 NOTE — Assessment & Plan Note (Signed)
Last lipid panel: LDL 91, HDL 72, triglycerides 117.  Continue rosuvastatin 5 mg daily, will continue to monitor.  CMP within normal limits with the exception of mild hypercalcemia.

## 2022-11-09 NOTE — Patient Instructions (Signed)
I have included some information about diabetes if you would like to read more.  I have added a summary below of the most important aspects for keeping up with diabetes.  The good news is that we are already doing most of it!  Diabetes Care: -Checking cholesterol and labs at least once a year (we already do this!) -Get an eye exam once a year and let them know that you need a diabetic eye exam to look all the way at the back of your eye at the retina -Checking your urine once a year -Foot exam once a year

## 2022-11-09 NOTE — Assessment & Plan Note (Signed)
Blood pressure is very inconsistent at home.  Elevated in office, patient does have diagnosis of whitecoat syndrome as well.  Encouraged to continue ambulatory blood pressure log.  Will review at follow-up appointment.  Right now, hesitant to start medication since some blood pressures were very low.  Encouraged increased hydration to eliminate any variables that would be contributing to those low blood pressures.

## 2022-11-13 DIAGNOSIS — G20A1 Parkinson's disease without dyskinesia, without mention of fluctuations: Secondary | ICD-10-CM | POA: Diagnosis not present

## 2022-11-14 ENCOUNTER — Other Ambulatory Visit: Payer: Self-pay | Admitting: Hematology and Oncology

## 2022-11-14 ENCOUNTER — Other Ambulatory Visit: Payer: Self-pay | Admitting: Family Medicine

## 2022-11-14 DIAGNOSIS — R12 Heartburn: Secondary | ICD-10-CM

## 2022-11-14 DIAGNOSIS — K219 Gastro-esophageal reflux disease without esophagitis: Secondary | ICD-10-CM

## 2022-12-01 ENCOUNTER — Other Ambulatory Visit: Payer: Self-pay | Admitting: Neurology

## 2022-12-07 ENCOUNTER — Encounter: Payer: Self-pay | Admitting: Family Medicine

## 2022-12-07 ENCOUNTER — Ambulatory Visit: Payer: Medicare PPO | Admitting: Family Medicine

## 2022-12-07 VITALS — BP 143/86 | HR 75 | Resp 18 | Ht 68.0 in | Wt 180.0 lb

## 2022-12-07 DIAGNOSIS — R21 Rash and other nonspecific skin eruption: Secondary | ICD-10-CM

## 2022-12-07 MED ORDER — DESONIDE 0.05 % EX CREA
TOPICAL_CREAM | Freq: Two times a day (BID) | CUTANEOUS | 0 refills | Status: DC
Start: 2022-12-07 — End: 2023-03-26

## 2022-12-07 NOTE — Progress Notes (Signed)
Acute Office Visit  Subjective:     Patient ID: Jamie [name], female    DOB: 10-19-1934, 88 y.o.   MRN: [id number]  Chief Complaint  Patient presents with   Rash    Face since 12/04/22    Rash   Patient is in today for 3 days of a rash on her face.  It initially started on her right cheek 3 days ago over the course of the day and has since spread gradually posteriorly to her ear and inferiorly to her jaw now covering the entire right cheek.  Patient endorses pruritus but denies pain.  She is not aware of any exposure to new allergens including detergents, soaps, lotion, plants. Denies abdominal pain, arthralgia, congestion, cough, crankiness, decrease in appetite, decrease in energy level, fever, headache, irritability, myalgia, nausea, sore throat, and vomiting.  She has not had similar rashes in the past.  ROS Negative unless otherwise noted in HPI    Objective:    BP (!) 143/86 (BP Location: Right Arm, Patient Position: Sitting, Cuff Size: Normal)   Pulse 75   Resp 18   Ht 5\' 8"  (1.727 m)   Wt 180 lb (81.6 kg)   SpO2 92%   BMI 27.37 kg/m   Physical Exam Constitutional:      General: She is not in acute distress.    Appearance: Normal appearance. She is not ill-appearing.  HENT:     Head: Normocephalic and atraumatic.     Right Ear: Tympanic membrane, ear canal and external ear normal.     Left Ear: Ear canal and external ear normal. There is impacted cerumen.     Nose: Nose normal. No congestion or rhinorrhea.     Mouth/Throat:     Mouth: Mucous membranes are moist.     Pharynx: Oropharynx is clear. No oropharyngeal exudate or posterior oropharyngeal erythema.  Eyes:     General:        Right eye: No discharge.        Left eye: No discharge.     Extraocular Movements: Extraocular movements intact.     Conjunctiva/sclera: Conjunctivae normal.     Pupils: Pupils are equal, round, and reactive to light.  Cardiovascular:     Rate and Rhythm: Normal rate and  regular rhythm.     Heart sounds: No murmur heard.    No friction rub. No gallop.  Pulmonary:     Effort: Pulmonary effort is normal. No respiratory distress.     Breath sounds: Normal breath sounds. No wheezing, rhonchi or rales.  Skin:    General: Skin is warm and dry.     Comments: Flat erythematous rash covering right cheek from bottom eyelid to jawline and ascending onto the neck inferior to the right ear.  Patient endorses mild tenderness to palpation.  Rash not warm compared to left cheek.  Scattered raised papules on lateral cheek. ~1 cm flat spot of increased, deeper erythema compared to surrounding cheek.  Neurological:     Mental Status: She is alert and oriented to person, place, and time.      Assessment & Plan:  Rash of face -     Desonide; Apply topically 2 (two) times daily. To affected area  Dispense: 15 g; Refill: 0  Trial of low potency steroid desonide 0.05% cream twice daily to the affected area.  Also encouraged use of emollients to maintain skin hydration and avoidance of the sun.  Treating for suspected irritant/allergic dermatitis.  DDx includes  rosacea, psoriasis, atopic dermatitis.  If there is no improvement or rash continues to spread, patient will return in 1 week.  Return if symptoms worsen or fail to improve.  [name], PA

## 2022-12-07 NOTE — Patient Instructions (Signed)
You may use the desonide cream on the affected area of your face twice a day until the rash resolves.  You should not use it more than 4 weeks in a row.  If the rash has not started to show any improvement or continues to spread by the end of next week, please get in touch with me.

## 2022-12-13 DIAGNOSIS — L84 Corns and callosities: Secondary | ICD-10-CM | POA: Diagnosis not present

## 2022-12-13 DIAGNOSIS — B351 Tinea unguium: Secondary | ICD-10-CM | POA: Diagnosis not present

## 2022-12-13 DIAGNOSIS — M2042 Other hammer toe(s) (acquired), left foot: Secondary | ICD-10-CM | POA: Diagnosis not present

## 2022-12-13 DIAGNOSIS — I70203 Unspecified atherosclerosis of native arteries of extremities, bilateral legs: Secondary | ICD-10-CM | POA: Diagnosis not present

## 2022-12-13 DIAGNOSIS — G20A1 Parkinson's disease without dyskinesia, without mention of fluctuations: Secondary | ICD-10-CM | POA: Diagnosis not present

## 2022-12-14 ENCOUNTER — Encounter: Payer: Self-pay | Admitting: Internal Medicine

## 2022-12-14 ENCOUNTER — Ambulatory Visit: Payer: Medicare PPO | Admitting: Nurse Practitioner

## 2022-12-14 ENCOUNTER — Ambulatory Visit: Payer: Medicare PPO | Admitting: Internal Medicine

## 2022-12-14 ENCOUNTER — Ambulatory Visit
Admission: RE | Admit: 2022-12-14 | Discharge: 2022-12-14 | Disposition: A | Discharge status: Home / Self Care | Payer: Medicare PPO | Source: Ambulatory Visit | Attending: Internal Medicine | Admitting: Internal Medicine

## 2022-12-14 VITALS — BP 128/72 | Ht 68.0 in | Wt 179.0 lb

## 2022-12-14 DIAGNOSIS — K219 Gastro-esophageal reflux disease without esophagitis: Secondary | ICD-10-CM

## 2022-12-14 DIAGNOSIS — K5900 Constipation, unspecified: Secondary | ICD-10-CM | POA: Diagnosis not present

## 2022-12-14 DIAGNOSIS — R630 Anorexia: Secondary | ICD-10-CM | POA: Diagnosis not present

## 2022-12-14 DIAGNOSIS — R140 Abdominal distension (gaseous): Secondary | ICD-10-CM

## 2022-12-14 NOTE — Progress Notes (Signed)
Chief Complaint: Bloating  HPI : 88 year old female with history of breast cancer s/p left breast mastectomy/radiation/chemotherapy, anxiety, Parkinson's disease, and osteoarthritis presents with bloating  Patient presents with her daughter-in-law to clinic today.  Her daughter-in-law serves as her caretaker.  Patient has had bloating for the last year. She feels like her pants get tighter. She is not really gaining weight or losing weight. Endorses a poor appetite. Denies nausea or vomiting. Denies dysphagia. She has constipation and takes senna regularly. She uses 2-4 doses of senna daily on average. On this regimen, she has one BM per day. Denies rectal bleeding. Denies ab pain. Denies family history of GI issues. Last colonoscopy was at least over 10 years ago that was normal. Does not recall if she has ever had an EGD in the past. She does try to eat regular meals because she does not have a good appetite. She has more issues with flatulence at times. Her last dose of chemotherapy was about two months ago and she first started her chemotherapy about a year ago. Denies chest burning or regurgitation. She is on omeprazole to help with GERD.  Wt Readings from Last 3 Encounters:  12/14/22 179 lb (81.2 kg)  12/07/22 180 lb (81.6 kg)  11/09/22 178 lb (80.7 kg)   Past Medical History:  Diagnosis Date   Abnormality of gait 04/18/2017   Breast cancer (HCC)    Left breast mastectomy.   Degenerative joint disease of knee, right 03/19/2018   Injected April 18, 2018 Approved for Durolane R knee 3.4.20. Patient given another steroid injection Jun 24, 2018.  Repeat injection September 09, 2018 repeat injection February 27, 2019 repeat 6/110/20221 bilateral injections given December 01, 2019 Right knee injected April 20, 2020 monovisc May 19, 2018 Repeat steroid injection given June 16, 2020 repeat injection August 24, 2020   Essential hypertension 08/14/2016   Generalized anxiety disorder 06/27/2017   History  of dizziness 06/27/2017   History of hysterectomy for benign disease 06/27/2017   History of vitamin D deficiency 08/22/2017   Leukocytes in urine 08/14/2016   Lumbar facet arthropathy 06/27/2017   Major neurocognitive disorder due to multiple etiologies 09/29/2020   Mixed hyperlipidemia 08/22/2017   Osteoarthritis of knee 09/18/2017   Pancolitis 08/14/2016   Parkinson's disease (HCC) 03/13/2017   Personal history of chemotherapy    Prediabetes 08/22/2017   Right knee pain    Spinal stenosis of lumbar region    Weakness of both hips 04/18/2017   White coat syndrome without diagnosis of hypertension 08/22/2017     Past Surgical History:  Procedure Laterality Date   BREAST BIOPSY Left    06/2021   HYSTERECTOMY ABDOMINAL WITH SALPINGECTOMY     MASTECTOMY W/ SENTINEL NODE BIOPSY Left 08/18/2021   Procedure: LEFT MASTECTOMY WITH TARGETED LYMPH NODE DISSECTION;  Surgeon: Abigail Miyamoto, MD;  Location: [facility];  Service: General;  Laterality: Left;   TONSILLECTOMY     TOTAL ABDOMINAL HYSTERECTOMY W/ BILATERAL SALPINGOOPHORECTOMY     Family History  Problem Relation Age of Onset   Breast cancer Mother 58   Melanoma Father        dx. 15s   Lung cancer Father        dx. 38s   Healthy Son    Social History   Tobacco Use   Smoking status: Never    Passive exposure: Never   Smokeless tobacco: Never  Vaping Use   Vaping status: Never Used  Substance Use  Topics   Alcohol use: No    Alcohol/week: 0.0 standard drinks of alcohol   Drug use: No   Current Outpatient Medications  Medication Sig Dispense Refill   Biotin 1 MG CAPS      carbidopa-levodopa (SINEMET CR) 50-200 MG tablet TAKE 1 TABLET BY MOUTH AT BEDTIME. 90 tablet 0   carbidopa-levodopa (SINEMET IR) 25-100 MG tablet TAKE 2 TABLETS BY MOUTH 3 TIMES DAILY. 540 tablet 0   cholecalciferol (VITAMIN D3) 25 MCG (1000 UNIT) tablet Take 2,000 Units by mouth daily.     cyanocobalamin (VITAMIN B12) 500 MCG  tablet      desonide (DESOWEN) 0.05 % cream Apply topically 2 (two) times daily. To affected area 15 g 0   donepezil (ARICEPT) 10 MG tablet Take 1 tablet (10 mg total) by mouth in the morning.     letrozole (FEMARA) 2.5 MG tablet TAKE 1 TABLET (2.5 MG TOTAL) BY MOUTH DAILY. 90 tablet 3   mirtazapine (REMERON) 15 MG tablet Take 1 tablet (15 mg total) by mouth at bedtime. 90 tablet 1   Multiple Vitamins-Minerals (PRESERVISION AREDS 2 PO) Take 1 tablet by mouth in the morning and at bedtime.      NON FORMULARY Herblax as needed     NON FORMULARY Cholesterol Reduction     omeprazole (PRILOSEC) 20 MG capsule TAKE 1 CAPSULE BY MOUTH DAILY AT 5:30 PM 90 capsule 0   rosuvastatin (CRESTOR) 5 MG tablet Take 1 tablet (5 mg total) by mouth at bedtime. 90 tablet 3   Vitamin E 100 units TABS Take 250 mg by mouth. Once a day     No current facility-administered medications for this visit.   Allergies  Allergen Reactions   Flagyl [Metronidazole] Rash    Possible cause of rash    Review of Systems: All systems reviewed and negative except where noted in HPI.   Physical Exam: BP 128/72   Ht 5\' 8"  (1.727 m)   Wt 179 lb (81.2 kg)   BMI 27.22 kg/m  Constitutional: Pleasant,well-developed, female in no acute distress. HEENT: Normocephalic and atraumatic. Conjunctivae are normal. No scleral icterus. Cardiovascular: Normal rate, regular rhythm.  Pulmonary/chest: Effort normal and breath sounds normal. No wheezing, rales or rhonchi. Abdominal: Soft, nondistended, nontender.  Quiet bowel sounds.  There are no masses palpable. No hepatomegaly. Extremities: No edema Neurological: Alert and oriented to person place and time. Skin: Skin is warm and dry. No rashes noted. Psychiatric: Normal mood and affect. Behavior is normal.  Labs 10/2022: CBC nml. CMP with elevated calcium at 10.7. TSH nml.   CT C/A/P w/contrast 09/12/21: IMPRESSION: Several small left axillary lymph nodes, largest measuring 11  mm, suspicious for metastatic disease. No evidence of abdominal or pelvic metastatic disease. 2 cm right thyroid lobe nodule. Recommend thyroid US. (ref: J Am Coll Radiol. 2015 Feb;12(2): 143-50). Aortic Atherosclerosis (ICD10-I70.0).  Pelvic U/S 08/22/22: IMPRESSION: Unremarkable examination of the pelvis status post hysterectomy. Ovaries were not identified.  ASSESSMENT AND PLAN: Bloating Poor appetite Constipation GERD Hypercalcemia Patient presents with bloating that has been present over the last year.  She also notes a poor appetite.  She has chronic issues with constipation and previously had issues with acid reflux until she started omeprazole therapy daily.  She takes Herb-Lax daily to help with her constipation.  Possible sources of her bloating and poor appetite could include constipation (patient has history of Parkinson's), SIBO, ingestion of certain types of foods, breast cancer chemotherapy (temporally seems to correlate with onset  of her symptoms), and/or her hypercalcemia.  I asked her to try a low FODMAP diet.  We will also check a KUB and plan for SIBO breath test.  Because her Herb-Lax therapy contains calcium supplementation, I asked her to try to reduce her Herb-Lax use and substitute with other constipation therapies such as senna or MiraLAX.  Hypercalcemia can lead to poor appetite and constipation. - Low FODMAP diet - Check SIBO breath test - Check KUB  - Decreasing Herb-Lax due to presence of calcium supplements and adding on either senna alone or Miralax to try to help with calcium levels - Consider further work up for hypercalcemia if calcium levels remain persistently elevated despite reducing calcium supplementation - RTC 3 months  Eulah Pont, MD  I spent 49 minutes of time, including in depth chart review, independent review of results as outlined above, communicating results with the patient directly, face-to-face time with the patient, coordinating care,  ordering studies and medications as appropriate, and documentation.

## 2022-12-14 NOTE — Patient Instructions (Addendum)
Your provider has requested that you have an abdominal x ray before leaving today. Please go to the basement floor to our Radiology department for the test.  Follow Low fodmap diet.  Decrease herblax and add senna or Miralax.  If your blood pressure at your visit was 140/90 or greater, please contact your primary care physician to follow up on this.  _______________________________________________________  If you are age 87 or older, your body mass index should be between 23-30. Your Body mass index is 27.22 kg/m. If this is out of the aforementioned range listed, please consider follow up with your Primary Care Provider.  If you are age 42 or younger, your body mass index should be between 19-25. Your Body mass index is 27.22 kg/m. If this is out of the aformentioned range listed, please consider follow up with your Primary Care Provider.   ________________________________________________________  The [facility] providers would like to encourage you to use [facility] to communicate with providers for non-urgent requests or questions.  Due to long hold times on the telephone, sending your provider a message by [facility] may be a faster and more efficient way to get a response.  Please allow 48 business hours for a response.  Please remember that this is for non-urgent requests.   It was a pleasure to see you today!  Thank you for trusting me with your gastrointestinal care!

## 2022-12-25 ENCOUNTER — Ambulatory Visit: Payer: Medicare PPO | Attending: Hematology and Oncology | Admitting: Physical Therapy

## 2022-12-25 DIAGNOSIS — Z483 Aftercare following surgery for neoplasm: Secondary | ICD-10-CM | POA: Insufficient documentation

## 2022-12-25 NOTE — Therapy (Signed)
OUTPATIENT PHYSICAL THERAPY SOZO SCREENING NOTE   Patient Name: Jamie [name] MRN: [id number] DOB:27-Jul-1934, 88 y.o., female Today's Date: 12/25/2022  PCP: [name], PA REFERRING PROVIDER: Serena Croissant, MD   PT End of Session - 12/25/22 1107     Visit Number 18    PT Start Time 0950    PT Stop Time 1020    PT Time Calculation (min) 30 min    Activity Tolerance Patient tolerated treatment well    Behavior During Therapy [facility] for tasks assessed/performed             Past Medical History:  Diagnosis Date   Abnormality of gait 04/18/2017   Breast cancer (HCC)    Left breast mastectomy.   Degenerative joint disease of knee, right 03/19/2018   Injected April 18, 2018 Approved for Durolane R knee 3.4.20. Patient given another steroid injection Jun 24, 2018.  Repeat injection September 09, 2018 repeat injection February 27, 2019 repeat 6/110/20221 bilateral injections given December 01, 2019 Right knee injected April 20, 2020 monovisc May 19, 2018 Repeat steroid injection given June 16, 2020 repeat injection August 24, 2020   Essential hypertension 08/14/2016   Generalized anxiety disorder 06/27/2017   History of dizziness 06/27/2017   History of hysterectomy for benign disease 06/27/2017   History of vitamin D deficiency 08/22/2017   Leukocytes in urine 08/14/2016   Lumbar facet arthropathy 06/27/2017   Major neurocognitive disorder due to multiple etiologies 09/29/2020   Mixed hyperlipidemia 08/22/2017   Osteoarthritis of knee 09/18/2017   Pancolitis 08/14/2016   Parkinson's disease (HCC) 03/13/2017   Personal history of chemotherapy    Prediabetes 08/22/2017   Right knee pain    Spinal stenosis of lumbar region    Weakness of both hips 04/18/2017   White coat syndrome without diagnosis of hypertension 08/22/2017   Past Surgical History:  Procedure Laterality Date   BREAST BIOPSY Left    06/2021   HYSTERECTOMY ABDOMINAL WITH SALPINGECTOMY     MASTECTOMY W/  SENTINEL NODE BIOPSY Left 08/18/2021   Procedure: LEFT MASTECTOMY WITH TARGETED LYMPH NODE DISSECTION;  Surgeon: Abigail Miyamoto, MD;  Location: [facility];  Service: General;  Laterality: Left;   TONSILLECTOMY     TOTAL ABDOMINAL HYSTERECTOMY W/ BILATERAL SALPINGOOPHORECTOMY     Patient Active Problem List   Diagnosis Date Noted   Hypercalcemia 11/09/2022   Osteoarthritis, hip, bilateral 09/07/2022   Lymphedema of left arm 09/07/2022   Gastroesophageal reflux disease without esophagitis 03/19/2022   Heartburn 03/19/2022   S/P left mastectomy 08/18/2021   Genetic testing 07/25/2021   Family history of breast cancer 07/14/2021   Malignant neoplasm of lower-inner quadrant of left breast in female, estrogen receptor positive (HCC) 07/11/2021   Mild episode of recurrent major depressive disorder (HCC) 04/06/2021   Age-related osteoporosis without current pathological fracture 04/06/2021   Greater trochanteric bursitis, left 01/21/2021   Major neurocognitive disorder due to multiple etiologies 09/29/2020   Right knee pain 05/07/2018   Osteoarthritis of knees, bilateral 03/19/2018   Osteoarthritis of knee 09/18/2017   Mixed diabetic hyperlipidemia associated with type 2 diabetes mellitus (HCC) 08/22/2017   Type 2 diabetes mellitus (HCC) 08/22/2017   White coat syndrome without diagnosis of hypertension 08/22/2017   History of vitamin D deficiency 08/22/2017   Lumbar facet arthropathy 06/27/2017   History of hysterectomy for benign disease 06/27/2017   Generalized anxiety disorder 06/27/2017   History of dizziness 06/27/2017   Other fatigue 06/27/2017   Overweight (BMI  25.0-29.9) 06/27/2017   Weakness of both hips 04/18/2017   Abnormality of gait 04/18/2017   Spinal stenosis of lumbar region 04/18/2017   Parkinson's disease (HCC) 03/13/2017   Pancolitis 08/14/2016   Essential hypertension 08/14/2016   Leukocytes in urine 08/14/2016    REFERRING DIAG: left breast  cancer at risk for lymphedema  THERAPY DIAG:  Aftercare following surgery for neoplasm  PERTINENT HISTORY: Patient was diagnosed on 06/27/2021 with left grade 2 invasive ductal carcinoma breast cancer. She underwent a left mastectomy and targeted axillary lymph node dissection (7/7 nodes positive) on 08/18/2021. It is triple positive with a Ki67 of 40%. She had radiation. Patient has cognitive deficits effecting memory and was diagnosed with Parkinson's in 2020   PRECAUTIONS: left UE Lymphedema risk  SUBJECTIVE: Patient reports she is here for her SOZO screening  PAIN:  Are you having pain? No  SOZO SCREENING: Patient was assessed today using the SOZO machine to determine the lymphedema index score. This was compared to her baseline score. It was determined that she is NOT within the recommended range when compared to her baseline. She already has a 20-30 mmHg compression sleeve and gauntlet which she wears regularly. Because this is a significant increase, she was given several options for care. I explained to her and her son that it is recommended she do PT to focus on manual lymph drainage and get a new compression garment. And then we could consider a compression pump after 4 weeks of conservative treatment. They opted to get a new compression sleeve (30-40 mmHg) and try that for 2 months. Due to her age, medical comorbidities, and living 30 min away, she did not want to regularly attend PT appointments again. When she returns in January 2025 for a SOZO screen, if her score continues to demonstrate lymphedema, they may consider returning to PT.   L-DEX FLOWSHEETS - 12/25/22 1100       L-DEX LYMPHEDEMA SCREENING   Measurement Type Unilateral    L-DEX MEASUREMENT EXTREMITY Upper Extremity    POSITION  Standing    DOMINANT SIDE Right    At Risk Side Left    BASELINE SCORE (UNILATERAL) 0.1    L-DEX SCORE (UNILATERAL) 8.2    VALUE CHANGE (UNILAT) 8.1              Bethann Punches,  [place] 12/25/22 11:13 AM

## 2023-01-13 DIAGNOSIS — G20A1 Parkinson's disease without dyskinesia, without mention of fluctuations: Secondary | ICD-10-CM | POA: Diagnosis not present

## 2023-01-15 ENCOUNTER — Other Ambulatory Visit: Payer: Self-pay | Admitting: Neurology

## 2023-01-15 DIAGNOSIS — G20A1 Parkinson's disease without dyskinesia, without mention of fluctuations: Secondary | ICD-10-CM

## 2023-01-23 NOTE — Progress Notes (Unsigned)
Assessment/Plan:   1.  Parkinsons Disease/SWEDD  -continue carbidopa/levodopa 25/100, 2 tablets 3 times daily.    -Continue carbidopa/levodopa 50/200 CR at bedtime  2.  Insomnia  -Has received benefit with melatonin, 2.5 mg nightly and will continue that  3.  Depression  -Continue Lexapro, 20 mg daily.  She is doing well with this.    -On mirtazapine, 15 mg at bedtime by primary care for mood and appetite  4.  Dementia, mixed type, PDD and AD  -This was identified on neurocognitive testing in August, 2022 and getting worse with time  -pt has people with her in the home most of the day/night (all but 2-4pm)  -continue donepezil to 10 mg daily 5.  Breast cancer  -Status post left mastectomy, currently undergoing chemotherapy.  Has completed Herceptin  6.  Urinary incontinence at night  -saw Dr. Ashley Royalty one time and doing well.  7.  History of blood pressure fluctuations  -We discussed concept of permissive hypertension in Parkinsons.  I agree with primary care that I would not want to start new blood pressure medication, especially given the fact that she has had blood pressures running in the 90s systolic at times.  I would rather see highs and lows.  Subjective:   Jamie [name] was seen today in follow up for Parkinsons disease.  My previous records were reviewed prior to todays visit as well as outside records available to me.  Patient with daughter in law who supplements the history.  Patient has been working with home physical therapy since last visit.  They noted some higher blood pressures at times and patient did follow-up with primary care.  She noted that blood pressures were sometimes very low and sometimes higher, but never very high.  Sometimes in the 140s or 150s.  She did not want to start new medication because of the very low dose.  She told patient to keep a blood pressure log.She has seen gastroenterology since last visit regarding bloating.  Current prescribed  movement disorder medications: Carbidopa/levodopa 25/100, 2 tablets 3 times per day  Carbidopa/levodopa 50/200 CR at bedtime  Melatonin, 3 mg daily Lexapro 20 mg Mirtazapine, 15 mg nightly Donepezil, 10 mg daily    ALLERGIES:   Allergies  Allergen Reactions   Flagyl [Metronidazole] Rash    Possible cause of rash     CURRENT MEDICATIONS:  Outpatient Encounter Medications as of 01/25/2023  Medication Sig   Biotin 1 MG CAPS    carbidopa-levodopa (SINEMET CR) 50-200 MG tablet TAKE 1 TABLET BY MOUTH AT BEDTIME.   carbidopa-levodopa (SINEMET IR) 25-100 MG tablet TAKE 2 TABLETS BY MOUTH 3 TIMES DAILY.   cholecalciferol (VITAMIN D3) 25 MCG (1000 UNIT) tablet Take 2,000 Units by mouth daily.   cyanocobalamin (VITAMIN B12) 500 MCG tablet    desonide (DESOWEN) 0.05 % cream Apply topically 2 (two) times daily. To affected area   donepezil (ARICEPT) 10 MG tablet Take 1 tablet (10 mg total) by mouth in the morning.   letrozole (FEMARA) 2.5 MG tablet TAKE 1 TABLET (2.5 MG TOTAL) BY MOUTH DAILY.   mirtazapine (REMERON) 15 MG tablet Take 1 tablet (15 mg total) by mouth at bedtime.   Multiple Vitamins-Minerals (PRESERVISION AREDS 2 PO) Take 1 tablet by mouth in the morning and at bedtime.    NON FORMULARY Herblax as needed   NON FORMULARY Cholesterol Reduction   omeprazole (PRILOSEC) 20 MG capsule TAKE 1 CAPSULE BY MOUTH DAILY AT 5:30 PM  rosuvastatin (CRESTOR) 5 MG tablet Take 1 tablet (5 mg total) by mouth at bedtime.   Vitamin E 100 units TABS Take 250 mg by mouth. Once a day   [DISCONTINUED] donepezil (ARICEPT) 5 MG tablet Take 1 tablet (5 mg total) by mouth at bedtime.   No facility-administered encounter medications on file as of 01/25/2023.    Objective:   PHYSICAL EXAMINATION:    VITALS:   Vitals:   01/25/23 0909  BP: 122/74  Pulse: 81  SpO2: 96%  Weight: 180 lb 3.2 oz (81.7 kg)    Wt Readings from Last 3 Encounters:  01/25/23 180 lb 3.2 oz (81.7 kg)  12/14/22 179 lb (81.2  kg)  12/07/22 180 lb (81.6 kg)    GEN:  The patient appears stated age and is in NAD. HEENT:  Normocephalic, atraumatic.  The mucous membranes are moist.  CV:  RRR Lungs:  CTAB  Neurological examination:  Orientation: The patient is alert and oriented x3. Cranial nerves: There is good facial symmetry with significant facial hypomimia. The speech is fluent and clear. Soft palate rises symmetrically and there is no tongue deviation. Hearing is intact to conversational tone. Sensation: Sensation is intact to light touch throughout Motor: Strength is at least antigravity x4.  Movement examination: Tone: There is paratonia bilaterally in the UE Abnormal movements: None Coordination:  There is good RAMs today Gait and Station: The patient pushes off to arise.  She is slow with short steps and drags the R leg  I have reviewed and interpreted the following labs independently    Chemistry      Component Value Date/Time   NA 139 11/01/2022 0939   K 4.5 11/01/2022 0939   CL 106 11/01/2022 0939   CO2 21 11/01/2022 0939   BUN 17 11/01/2022 0939   CREATININE 0.75 11/01/2022 0939   CREATININE 0.86 07/13/2021 0831   GLU 98 05/30/2017 0000      Component Value Date/Time   CALCIUM 10.7 (H) 11/01/2022 0939   ALKPHOS 98 11/01/2022 0939   AST 20 11/01/2022 0939   AST 18 07/13/2021 0831   ALT 7 11/01/2022 0939   ALT 18 07/13/2021 0831   BILITOT 0.4 11/01/2022 0939   BILITOT 0.6 07/13/2021 0831       Lab Results  Component Value Date   WBC 4.8 11/01/2022   HGB 13.5 11/01/2022   HCT 40.6 11/01/2022   MCV 95 11/01/2022   PLT 213 11/01/2022    Lab Results  Component Value Date   TSH 3.060 11/01/2022   Total time spent on today's visit was 20 minutes, including both face-to-face time and nonface-to-face time.  Time included that spent on review of records (prior notes available to me/labs/imaging if pertinent), discussing treatment and goals, answering patient's questions and  coordinating care.   Cc:  [name], PA

## 2023-01-25 ENCOUNTER — Ambulatory Visit: Payer: Medicare PPO | Admitting: Neurology

## 2023-01-25 VITALS — BP 122/74 | HR 81 | Ht 68.0 in | Wt 180.2 lb

## 2023-01-25 DIAGNOSIS — F0280 Dementia in other diseases classified elsewhere without behavioral disturbance: Secondary | ICD-10-CM | POA: Diagnosis not present

## 2023-01-25 DIAGNOSIS — G20A1 Parkinson's disease without dyskinesia, without mention of fluctuations: Secondary | ICD-10-CM | POA: Diagnosis not present

## 2023-01-25 DIAGNOSIS — G4700 Insomnia, unspecified: Secondary | ICD-10-CM

## 2023-02-06 ENCOUNTER — Ambulatory Visit: Payer: Medicare PPO | Admitting: Family Medicine

## 2023-02-06 NOTE — Progress Notes (Unsigned)
[name] [address] [place] [address] Phone: [phone] Subjective:   Jamie [name], am serving as a scribe for Dr. Antoine [name].  I'm seeing this patient by the request  of:  Jamie Quitter, PA  CC: Right knee, low back pain  BJY:[id number]  11/08/2022 Injection B knee  Updated 02/07/2023 Jamie [name] is a 88 y.o. female coming in with complaint of B knee pain. R knee has been bothering her. Would like injection.   Also c/o pain in lumbar spine that is radiating down the R leg. Son in in room with patient and he states that pain has been occurring during their walks and they will have to turn around due to the pain.        Past Medical History:  Diagnosis Date   Abnormality of gait 04/18/2017   Breast cancer (HCC)    Left breast mastectomy.   Degenerative joint disease of knee, right 03/19/2018   Injected April 18, 2018 Approved for Durolane R knee 3.4.20. Patient given another steroid injection Jun 24, 2018.  Repeat injection September 09, 2018 repeat injection February 27, 2019 repeat 6/110/20221 bilateral injections given December 01, 2019 Right knee injected April 20, 2020 monovisc May 19, 2018 Repeat steroid injection given June 16, 2020 repeat injection August 24, 2020   Essential hypertension 08/14/2016   Generalized anxiety disorder 06/27/2017   History of dizziness 06/27/2017   History of hysterectomy for benign disease 06/27/2017   History of vitamin D deficiency 08/22/2017   Leukocytes in urine 08/14/2016   Lumbar facet arthropathy 06/27/2017   Major neurocognitive disorder due to multiple etiologies 09/29/2020   Mixed hyperlipidemia 08/22/2017   Osteoarthritis of knee 09/18/2017   Pancolitis 08/14/2016   Parkinson's disease (HCC) 03/13/2017   Personal history of chemotherapy    Prediabetes 08/22/2017   Right knee pain    Spinal stenosis of lumbar region    Weakness of both hips 04/18/2017   White coat syndrome  without diagnosis of hypertension 08/22/2017   Past Surgical History:  Procedure Laterality Date   BREAST BIOPSY Left    06/2021   HYSTERECTOMY ABDOMINAL WITH SALPINGECTOMY     MASTECTOMY W/ SENTINEL NODE BIOPSY Left 08/18/2021   Procedure: LEFT MASTECTOMY WITH TARGETED LYMPH NODE DISSECTION;  Surgeon: Abigail Miyamoto, MD;  Location: [facility];  Service: General;  Laterality: Left;   TONSILLECTOMY     TOTAL ABDOMINAL HYSTERECTOMY W/ BILATERAL SALPINGOOPHORECTOMY     Social History   Socioeconomic History   Marital status: Widowed    Spouse name: Not on file   Number of children: 3   Years of education: 12   Highest education level: High school graduate  Occupational History   Not on file  Tobacco Use   Smoking status: Never    Passive exposure: Never   Smokeless tobacco: Never  Vaping Use   Vaping status: Never Used  Substance and Sexual Activity   Alcohol use: No    Alcohol/week: 0.0 standard drinks of alcohol   Drug use: No   Sexual activity: Not Currently    Birth control/protection: Surgical  Other Topics Concern   Not on file  Social History Narrative   Pt lives alone in her 1 story home- she has 3 sons, and a high school graduate. She is right handed, she drinks coffee, and one soda a week mostly water   Social Drivers of Health   Financial Resource Strain: Low Risk  (  09/07/2022)   Overall Financial Resource Strain (CARDIA)    Difficulty of Paying Living Expenses: Not hard at all  Food Insecurity: No Food Insecurity (09/07/2022)   Hunger Vital Sign    Worried About Running Out of Food in the Last Year: Never true    Ran Out of Food in the Last Year: Never true  Transportation Needs: No Transportation Needs (09/07/2022)   PRAPARE - Administrator, Civil Service (Medical): No    Lack of Transportation (Non-Medical): No  Physical Activity: Insufficiently Active (09/07/2022)   Exercise Vital Sign    Days of Exercise per Week: 2 days     Minutes of Exercise per Session: 30 min  Stress: No Stress Concern Present (09/07/2022)   [employer] of Occupational Health - Occupational Stress Questionnaire    Feeling of Stress : Not at all  Social Connections: Moderately Integrated (09/07/2022)   Social Connection and Isolation Panel [NHANES]    Frequency of Communication with Friends and Family: More than three times a week    Frequency of Social Gatherings with Friends and Family: More than three times a week    Attends Religious Services: More than 4 times per year    Active Member of [employer] or Organizations: Yes    Attends Banker Meetings: More than 4 times per year    Marital Status: Widowed   Allergies  Allergen Reactions   Flagyl [Metronidazole] Rash    Possible cause of rash    Family History  Problem Relation Age of Onset   Breast cancer Mother 55   Melanoma Father        dx. 50s   Lung cancer Father        dx. 25s   Healthy Son      Current Outpatient Medications (Cardiovascular):    rosuvastatin (CRESTOR) 5 MG tablet, Take 1 tablet (5 mg total) by mouth at bedtime.    Current Outpatient Medications (Hematological):    cyanocobalamin (VITAMIN B12) 500 MCG tablet,   Current Outpatient Medications (Other):    Biotin 1 MG CAPS,    carbidopa-levodopa (SINEMET CR) 50-200 MG tablet, TAKE 1 TABLET BY MOUTH AT BEDTIME.   carbidopa-levodopa (SINEMET IR) 25-100 MG tablet, TAKE 2 TABLETS BY MOUTH 3 TIMES DAILY.   cholecalciferol (VITAMIN D3) 25 MCG (1000 UNIT) tablet, Take 2,000 Units by mouth daily.   desonide (DESOWEN) 0.05 % cream, Apply topically 2 (two) times daily. To affected area   donepezil (ARICEPT) 10 MG tablet, Take 1 tablet (10 mg total) by mouth in the morning.   letrozole (FEMARA) 2.5 MG tablet, TAKE 1 TABLET (2.5 MG TOTAL) BY MOUTH DAILY.   mirtazapine (REMERON) 15 MG tablet, Take 1 tablet (15 mg total) by mouth at bedtime.   Multiple Vitamins-Minerals (PRESERVISION AREDS 2 PO), Take  1 tablet by mouth in the morning and at bedtime.    NON FORMULARY, Herblax as needed   NON FORMULARY, Cholesterol Reduction   omeprazole (PRILOSEC) 20 MG capsule, TAKE 1 CAPSULE BY MOUTH DAILY AT 5:30 PM   Vitamin E 100 units TABS, Take 250 mg by mouth. Once a day   Reviewed prior external information including notes and imaging from  primary care provider As well as notes that were available from care everywhere and other healthcare systems.  Past medical history, social, surgical and family history all reviewed in electronic medical record.  No pertanent information unless stated regarding to the chief complaint.   Review of Systems:  No headache, visual changes, nausea, vomiting, diarrhea, constipation, dizziness, abdominal pain, skin rash, fevers, chills, night sweats, weight loss, swollen lymph nodes, body aches, joint swelling, chest pain, shortness of breath, mood changes. POSITIVE muscle aches  Objective  Blood pressure 122/86, pulse 83, height 5\' 8"  (1.727 m), weight 179 lb (81.2 kg), SpO2 98%.   General: No apparent distress alert  affect normal, dressed appropriately.  HEENT: Pupils equal, extraocular movements intact  Respiratory: Patient's speak in full sentences and does not appear short of breath  Cardiovascular: No lower extremity edema, non tender, no erythema  Shuffling gait noted.  Does have effusion noted of the right knee.  Left knee seems to be relatively okay.  Patient does have worsening pain with extension of the back.  Patient does seem a little more forgetful than usual.   After informed written and verbal consent, patient was seated on exam table. Right knee was prepped with alcohol swab and utilizing anterolateral approach, patient's right knee space was injected with 4:1  marcaine 0.5%: Kenalog 40mg /dL. Patient tolerated the procedure well without immediate complications.   Impression and Recommendations:    The above documentation has been reviewed and is  accurate and complete Judi Saa, DO

## 2023-02-07 ENCOUNTER — Encounter: Payer: Self-pay | Admitting: Family Medicine

## 2023-02-07 ENCOUNTER — Ambulatory Visit: Payer: Medicare PPO | Admitting: Family Medicine

## 2023-02-07 ENCOUNTER — Ambulatory Visit (INDEPENDENT_AMBULATORY_CARE_PROVIDER_SITE_OTHER): Payer: Medicare PPO

## 2023-02-07 VITALS — BP 122/86 | HR 83 | Ht 68.0 in | Wt 179.0 lb

## 2023-02-07 DIAGNOSIS — M47816 Spondylosis without myelopathy or radiculopathy, lumbar region: Secondary | ICD-10-CM | POA: Diagnosis not present

## 2023-02-07 DIAGNOSIS — M170 Bilateral primary osteoarthritis of knee: Secondary | ICD-10-CM | POA: Diagnosis not present

## 2023-02-07 DIAGNOSIS — M545 Low back pain, unspecified: Secondary | ICD-10-CM | POA: Diagnosis not present

## 2023-02-07 DIAGNOSIS — M5126 Other intervertebral disc displacement, lumbar region: Secondary | ICD-10-CM | POA: Diagnosis not present

## 2023-02-07 DIAGNOSIS — M48061 Spinal stenosis, lumbar region without neurogenic claudication: Secondary | ICD-10-CM | POA: Diagnosis not present

## 2023-02-07 DIAGNOSIS — M5136 Other intervertebral disc degeneration, lumbar region with discogenic back pain only: Secondary | ICD-10-CM | POA: Diagnosis not present

## 2023-02-07 DIAGNOSIS — M5137 Other intervertebral disc degeneration, lumbosacral region with discogenic back pain only: Secondary | ICD-10-CM | POA: Diagnosis not present

## 2023-02-07 NOTE — Patient Instructions (Signed)
Xray today Injected knee If back pain does not improved we can get MRI See me again in 3 months

## 2023-02-07 NOTE — Assessment & Plan Note (Addendum)
Chronic problem with worsening symptoms.  Has had spinal stenosis and is still consistent with that at the moment.  Discussed icing regimen and home exercises, discussed avoiding certain activities that could be contributing as well.  Follow-up with me again in 7 to 8 weeks.  No new x-rays given.

## 2023-02-07 NOTE — Assessment & Plan Note (Signed)
Chronic problem with sever tenderness noted.  Patient still having difficulty.  Does have underlying Parkinson's that does seem to contribute to the tightness as well.  Discussed icing regimen and home exercises.  Discussed avoiding certain activities.  Follow-up with me again in 6 to 8 weeks.

## 2023-02-08 ENCOUNTER — Other Ambulatory Visit: Payer: Self-pay | Admitting: Family Medicine

## 2023-02-08 DIAGNOSIS — K219 Gastro-esophageal reflux disease without esophagitis: Secondary | ICD-10-CM

## 2023-02-08 DIAGNOSIS — R12 Heartburn: Secondary | ICD-10-CM

## 2023-02-12 DIAGNOSIS — G20A1 Parkinson's disease without dyskinesia, without mention of fluctuations: Secondary | ICD-10-CM | POA: Diagnosis not present

## 2023-02-27 ENCOUNTER — Other Ambulatory Visit: Payer: Self-pay | Admitting: Neurology

## 2023-02-27 DIAGNOSIS — G20A1 Parkinson's disease without dyskinesia, without mention of fluctuations: Secondary | ICD-10-CM

## 2023-03-05 ENCOUNTER — Ambulatory Visit: Payer: Medicare PPO | Attending: Hematology and Oncology | Admitting: Physical Therapy

## 2023-03-05 ENCOUNTER — Encounter: Payer: Self-pay | Admitting: Physical Therapy

## 2023-03-05 DIAGNOSIS — Z483 Aftercare following surgery for neoplasm: Secondary | ICD-10-CM | POA: Insufficient documentation

## 2023-03-05 NOTE — Therapy (Signed)
 OUTPATIENT PHYSICAL THERAPY SOZO SCREENING NOTE   Patient Name: Jamie [name] MRN: [id number] DOB:1934/09/04, 89 y.o., female Today's Date: 03/05/2023  PCP: Wallace Joesph LABOR, PA REFERRING PROVIDER: Odean Potts, MD   PT End of Session - 03/05/23 1058     Visit Number 18    Number of Visits 18    PT Start Time 1102    PT Stop Time 1120    PT Time Calculation (min) 18 min    Activity Tolerance Patient tolerated treatment well    Behavior During Therapy [facility] for tasks assessed/performed             Past Medical History:  Diagnosis Date   Abnormality of gait 04/18/2017   Breast cancer (HCC)    Left breast mastectomy.   Degenerative joint disease of knee, right 03/19/2018   Injected April 18, 2018 Approved for Durolane R knee 3.4.20. Patient given another steroid injection Jun 24, 2018.  Repeat injection September 09, 2018 repeat injection February 27, 2019 repeat 6/110/20221 bilateral injections given December 01, 2019 Right knee injected April 20, 2020 monovisc May 19, 2018 Repeat steroid injection given June 16, 2020 repeat injection August 24, 2020   Essential hypertension 08/14/2016   Generalized anxiety disorder 06/27/2017   History of dizziness 06/27/2017   History of hysterectomy for benign disease 06/27/2017   History of vitamin D  deficiency 08/22/2017   Leukocytes in urine 08/14/2016   Lumbar facet arthropathy 06/27/2017   Major neurocognitive disorder due to multiple etiologies 09/29/2020   Mixed hyperlipidemia 08/22/2017   Osteoarthritis of knee 09/18/2017   Pancolitis 08/14/2016   Parkinson's disease (HCC) 03/13/2017   Personal history of chemotherapy    Prediabetes 08/22/2017   Right knee pain    Spinal stenosis of lumbar region    Weakness of both hips 04/18/2017   White coat syndrome without diagnosis of hypertension 08/22/2017   Past Surgical History:  Procedure Laterality Date   BREAST BIOPSY Left    06/2021   HYSTERECTOMY ABDOMINAL WITH SALPINGECTOMY      MASTECTOMY W/ SENTINEL NODE BIOPSY Left 08/18/2021   Procedure: LEFT MASTECTOMY WITH TARGETED LYMPH NODE DISSECTION;  Surgeon: Vernetta Berg, MD;  Location: [facility];  Service: General;  Laterality: Left;   TONSILLECTOMY     TOTAL ABDOMINAL HYSTERECTOMY W/ BILATERAL SALPINGOOPHORECTOMY     Patient Active Problem List   Diagnosis Date Noted   Hypercalcemia 11/09/2022   Osteoarthritis, hip, bilateral 09/07/2022   Lymphedema of left arm 09/07/2022   Gastroesophageal reflux disease without esophagitis 03/19/2022   Heartburn 03/19/2022   S/P left mastectomy 08/18/2021   Genetic testing 07/25/2021   Family history of breast cancer 07/14/2021   Malignant neoplasm of lower-inner quadrant of left breast in female, estrogen receptor positive (HCC) 07/11/2021   Mild episode of recurrent major depressive disorder (HCC) 04/06/2021   Age-related osteoporosis without current pathological fracture 04/06/2021   Greater trochanteric bursitis, left 01/21/2021   Major neurocognitive disorder due to multiple etiologies 09/29/2020   Right knee pain 05/07/2018   Osteoarthritis of knees, bilateral 03/19/2018   Osteoarthritis of knee 09/18/2017   Mixed diabetic hyperlipidemia associated with type 2 diabetes mellitus (HCC) 08/22/2017   Type 2 diabetes mellitus (HCC) 08/22/2017   White coat syndrome without diagnosis of hypertension 08/22/2017   History of vitamin D  deficiency 08/22/2017   Lumbar facet arthropathy 06/27/2017   History of hysterectomy for benign disease 06/27/2017   Generalized anxiety disorder 06/27/2017   History of dizziness 06/27/2017  Other fatigue 06/27/2017   Overweight (BMI 25.0-29.9) 06/27/2017   Weakness of both hips 04/18/2017   Abnormality of gait 04/18/2017   Spinal stenosis of lumbar region 04/18/2017   Parkinson's disease (HCC) 03/13/2017   Pancolitis 08/14/2016   Essential hypertension 08/14/2016   Leukocytes in urine 08/14/2016    REFERRING  DIAG: left breast cancer at risk for lymphedema  THERAPY DIAG:  Aftercare following surgery for neoplasm  PERTINENT HISTORY: Patient was diagnosed on 06/27/2021 with left grade 2 invasive ductal carcinoma breast cancer. She underwent a left mastectomy and targeted axillary lymph node dissection (7/7 nodes positive) on 08/18/2021. It is triple positive with a Ki67 of 40%. She had radiation. Patient has cognitive deficits effecting memory and was diagnosed with Parkinson's in 2020   PRECAUTIONS: left UE Lymphedema risk, Other: cognitive  SUBJECTIVE: Here for SOZO screen  PAIN:  Are you having pain? No  SOZO SCREENING: Patient was assessed today using the SOZO machine to determine the lymphedema index score. This was compared to her baseline score. It was determined that she is within the recommended range when compared to her baseline and no further action is needed at this time. She will continue SOZO screenings. These are done every 3 months for 2 years post operatively followed by every 6 months for 2 years, and then annually.   L-DEX FLOWSHEETS - 03/05/23 1000       L-DEX LYMPHEDEMA SCREENING   Measurement Type Unilateral    L-DEX MEASUREMENT EXTREMITY Upper Extremity    POSITION  Standing    DOMINANT SIDE Right    At Risk Side Left    BASELINE SCORE (UNILATERAL) 0.1    L-DEX SCORE (UNILATERAL) -2.7    VALUE CHANGE (UNILAT) -2.8            Eward Wonda Sharps, [place] 03/05/23 1:46 PM

## 2023-03-08 ENCOUNTER — Ambulatory Visit: Payer: Medicare PPO | Admitting: Family Medicine

## 2023-03-26 ENCOUNTER — Other Ambulatory Visit: Payer: Self-pay | Admitting: Family Medicine

## 2023-03-26 DIAGNOSIS — R21 Rash and other nonspecific skin eruption: Secondary | ICD-10-CM

## 2023-03-29 ENCOUNTER — Encounter: Payer: Self-pay | Admitting: Family Medicine

## 2023-04-05 ENCOUNTER — Ambulatory Visit: Payer: Medicare PPO | Admitting: Family Medicine

## 2023-04-05 ENCOUNTER — Encounter: Payer: Self-pay | Admitting: Family Medicine

## 2023-04-05 VITALS — BP 128/72 | HR 83 | Ht 68.0 in | Wt 181.0 lb

## 2023-04-05 DIAGNOSIS — E119 Type 2 diabetes mellitus without complications: Secondary | ICD-10-CM

## 2023-04-05 DIAGNOSIS — I10 Essential (primary) hypertension: Secondary | ICD-10-CM

## 2023-04-05 LAB — POCT GLYCOSYLATED HEMOGLOBIN (HGB A1C): Hemoglobin A1C: 6.3 % — AB (ref 4.0–5.6)

## 2023-04-05 NOTE — Assessment & Plan Note (Addendum)
A1c today 6.3, at goal.  Continue to limit sugar and carbs. Discussed that she will need a diabetic eye exam to look for changes in the retina.  Will attempt to collect urine microalbumin today.

## 2023-04-05 NOTE — Assessment & Plan Note (Addendum)
Blood pressure is very inconsistent at home, but generally is at goal.  Elevated initially in office, likely due to whitecoat syndrome.  On recheck was at goal <130/80. Encouraged to continue ambulatory blood pressure log.  In the immediate future, will not start medication since some blood pressures are very low.  Encouraged increased hydration to eliminate any variables that would be contributing to those low blood pressures.

## 2023-04-05 NOTE — Progress Notes (Signed)
Established Patient Office Visit  Subjective   Patient ID: Jamie [name], female    DOB: May 23, 1934  Age: 89 y.o. MRN: [id number]  Chief Complaint  Patient presents with   Hypertension   Diabetes    HPI Jamie [name] is a 89 y.o. female presenting today for follow up of hypertension, diabetes. Hypertension: Pt denies chest pain, SOB, dizziness, edema, syncope, fatigue or heart palpitations.  Not currently taking any medication.  Checking blood pressure at home, it does fluctuate quite a bit but generally remains under 130 systolic. Diabetes: denies hypoglycemic events, wounds or sores that are not healing well, increased thirst or urination. Denies vision problems, eye exam upcoming in April at [facility].  Currently managing with nutrition and lifestyle.     Outpatient Medications Prior to Visit  Medication Sig   Biotin 1 MG CAPS    carbidopa-levodopa (SINEMET CR) 50-200 MG tablet TAKE 1 TABLET BY MOUTH AT BEDTIME.   carbidopa-levodopa (SINEMET IR) 25-100 MG tablet TAKE 2 TABLETS BY MOUTH 3 TIMES DAILY.   cholecalciferol (VITAMIN D3) 25 MCG (1000 UNIT) tablet Take 2,000 Units by mouth daily.   cyanocobalamin (VITAMIN B12) 500 MCG tablet    desonide (DESOWEN) 0.05 % cream APPLY TO AFFECTED AREA ON THE SKIN 2 TIMES A DAY   donepezil (ARICEPT) 10 MG tablet Take 1 tablet (10 mg total) by mouth in the morning.   letrozole (FEMARA) 2.5 MG tablet TAKE 1 TABLET (2.5 MG TOTAL) BY MOUTH DAILY.   mirtazapine (REMERON) 15 MG tablet Take 1 tablet (15 mg total) by mouth at bedtime.   Multiple Vitamins-Minerals (PRESERVISION AREDS 2 PO) Take 1 tablet by mouth in the morning and at bedtime.    NON FORMULARY Herblax as needed   NON FORMULARY Cholesterol Reduction   omeprazole (PRILOSEC) 20 MG capsule TAKE 1 CAPSULE BY MOUTH DAILY AT 5:30 PM   rosuvastatin (CRESTOR) 5 MG tablet Take 1 tablet (5 mg total) by mouth at bedtime.   Vitamin E 100 units TABS Take 250 mg by mouth. Once a  day   No facility-administered medications prior to visit.    ROS Negative unless otherwise noted in HPI   Objective:     BP 128/72   Pulse 83   Ht 5\' 8"  (1.727 m)   Wt 181 lb (82.1 kg)   SpO2 98%   BMI 27.52 kg/m   Physical Exam Constitutional:      General: She is not in acute distress.    Appearance: Normal appearance.  HENT:     Head: Normocephalic and atraumatic.  Cardiovascular:     Rate and Rhythm: Normal rate and regular rhythm.     Heart sounds: No murmur heard.    No friction rub. No gallop.  Pulmonary:     Effort: Pulmonary effort is normal. No respiratory distress.     Breath sounds: No wheezing, rhonchi or rales.  Skin:    General: Skin is warm and dry.  Neurological:     Mental Status: She is alert and oriented to person, place, and time.    Diabetic Foot Exam - Simple   Simple Foot Form Diabetic Foot exam was performed with the following findings: Yes 04/05/2023  1:49 PM  Visual Inspection No deformities, no ulcerations, no other skin breakdown bilaterally: Yes Sensation Testing Intact to touch and monofilament testing bilaterally: Yes Pulse Check Posterior Tibialis and Dorsalis pulse intact bilaterally: Yes Comments     Results for orders placed or performed in  visit on 04/05/23  POCT HgB A1C  Result Value Ref Range   Hemoglobin A1C 6.3 (A) 4.0 - 5.6 %   HbA1c POC (<> result, manual entry)     HbA1c, POC (prediabetic range)     HbA1c, POC (controlled diabetic range)       Assessment & Plan:  Essential hypertension Assessment & Plan: Blood pressure is very inconsistent at home, but generally is at goal.  Elevated in office, likely due to whitecoat syndrome.  Encouraged to continue ambulatory blood pressure log.  In the immediate future, will not start medication since some blood pressures are very low.  Encouraged increased hydration to eliminate any variables that would be contributing to those low blood pressures.   Type 2 diabetes  mellitus without complication, without long-term current use of insulin (HCC) Assessment & Plan: A1c today 6.3, at goal.  Continue to limit sugar and carbs. Discussed that she will need a diabetic eye exam to look for changes in the retina.  Will attempt to collect urine microalbumin today.  Orders: -     POCT glycosylated hemoglobin (Hb A1C)    Return in about 4 months (around 08/03/2023) for follow-up for HTN, HLD, DM, fasting labs 1 week before.    Jamie Quitter, PA

## 2023-04-10 ENCOUNTER — Other Ambulatory Visit: Payer: Self-pay | Admitting: Neurology

## 2023-04-10 DIAGNOSIS — G20A1 Parkinson's disease without dyskinesia, without mention of fluctuations: Secondary | ICD-10-CM

## 2023-04-11 ENCOUNTER — Other Ambulatory Visit: Payer: Self-pay

## 2023-04-30 ENCOUNTER — Encounter: Payer: Self-pay | Admitting: Neurology

## 2023-05-02 NOTE — Progress Notes (Unsigned)
 Assessment/Plan:   1.  Parkinsons Disease/SWEDD  -continue carbidopa/levodopa 25/100, 2 tablets 3 times daily.    -Continue carbidopa/levodopa 50/200 CR at bedtime  2.  Insomnia  -Has received benefit with melatonin, 2.5 mg nightly and will continue that  3.  Depression  -Continue Lexapro, 20 mg daily.  She is doing well with this.    -On mirtazapine, 15 mg at bedtime by primary care for mood and appetite  4.  Dementia, mixed type, PDD and AD  -This was identified on neurocognitive testing in August, 2022 and getting worse with time  -pt has caregivers with her in the home most of the day/night (all but 2-4pm)  -continue donepezil to 10 mg daily 5.  Breast cancer  -Status post left mastectomy, currently undergoing chemotherapy.  Has completed Herceptin  6.  Urinary incontinence at night  -saw Dr. Ashley Royalty one time and doing well.  7.  History of blood pressure fluctuations  -We discussed concept of permissive hypertension in Parkinsons.  I agree with primary care that I would not want to start new blood pressure medication, especially given the fact that she has had blood pressures running in the 90s systolic at times.  I would rather see highs and lows.  Subjective:   Jamie [name] was seen today in follow up for Parkinsons disease.  My previous records were reviewed prior to todays visit as well as outside records available to me.  Patient with daughter in law who supplements the history.  She was worked in today.  They emailed me a few days ago stating that patient's appetite was poor and she was having some anger issues and also some more tremor.  I did ask them to follow-up with primary care regarding the appetite issues and mood issues (she is on mirtazapine by primary care for this), but told them that we would get them in soon regarding the movement issues.  Current prescribed movement disorder medications: Carbidopa/levodopa 25/100, 2 tablets 3 times per day   Carbidopa/levodopa 50/200 CR at bedtime  Melatonin, 3 mg daily Lexapro 20 mg Mirtazapine, 15 mg nightly Donepezil, 10 mg daily    ALLERGIES:   Allergies  Allergen Reactions   Flagyl [Metronidazole] Rash    Possible cause of rash     CURRENT MEDICATIONS:  Outpatient Encounter Medications as of 05/03/2023  Medication Sig   Biotin 1 MG CAPS    carbidopa-levodopa (SINEMET CR) 50-200 MG tablet TAKE 1 TABLET BY MOUTH AT BEDTIME.   carbidopa-levodopa (SINEMET IR) 25-100 MG tablet TAKE 2 TABLETS BY MOUTH 3 TIMES DAILY.   cholecalciferol (VITAMIN D3) 25 MCG (1000 UNIT) tablet Take 2,000 Units by mouth daily.   cyanocobalamin (VITAMIN B12) 500 MCG tablet    desonide (DESOWEN) 0.05 % cream APPLY TO AFFECTED AREA ON THE SKIN 2 TIMES A DAY   donepezil (ARICEPT) 10 MG tablet Take 1 tablet (10 mg total) by mouth in the morning.   letrozole (FEMARA) 2.5 MG tablet TAKE 1 TABLET (2.5 MG TOTAL) BY MOUTH DAILY.   mirtazapine (REMERON) 15 MG tablet Take 1 tablet (15 mg total) by mouth at bedtime.   Multiple Vitamins-Minerals (PRESERVISION AREDS 2 PO) Take 1 tablet by mouth in the morning and at bedtime.    NON FORMULARY Herblax as needed   NON FORMULARY Cholesterol Reduction   omeprazole (PRILOSEC) 20 MG capsule TAKE 1 CAPSULE BY MOUTH DAILY AT 5:30 PM   rosuvastatin (CRESTOR) 5 MG tablet Take 1 tablet (5 mg total)  by mouth at bedtime.   Vitamin E 100 units TABS Take 250 mg by mouth. Once a day   [DISCONTINUED] donepezil (ARICEPT) 5 MG tablet Take 1 tablet (5 mg total) by mouth at bedtime.   No facility-administered encounter medications on file as of 05/03/2023.    Objective:   PHYSICAL EXAMINATION:    VITALS:   There were no vitals filed for this visit.   Wt Readings from Last 3 Encounters:  04/05/23 181 lb (82.1 kg)  02/07/23 179 lb (81.2 kg)  01/25/23 180 lb 3.2 oz (81.7 kg)    GEN:  The patient appears stated age and is in NAD. HEENT:  Normocephalic, atraumatic.  The mucous  membranes are moist.  CV:  RRR Lungs:  CTAB  Neurological examination:  Orientation: The patient is alert and oriented x3. Cranial nerves: There is good facial symmetry with significant facial hypomimia. The speech is fluent and clear. Soft palate rises symmetrically and there is no tongue deviation. Hearing is intact to conversational tone. Sensation: Sensation is intact to light touch throughout Motor: Strength is at least antigravity x4.  Movement examination: Tone: There is paratonia bilaterally in the UE Abnormal movements: None Coordination:  There is good RAMs today Gait and Station: The patient pushes off to arise.  She is slow with short steps and drags the R leg  I have reviewed and interpreted the following labs independently    Chemistry      Component Value Date/Time   NA 139 11/01/2022 0939   K 4.5 11/01/2022 0939   CL 106 11/01/2022 0939   CO2 21 11/01/2022 0939   BUN 17 11/01/2022 0939   CREATININE 0.75 11/01/2022 0939   CREATININE 0.86 07/13/2021 0831   GLU 98 05/30/2017 0000      Component Value Date/Time   CALCIUM 10.7 (H) 11/01/2022 0939   ALKPHOS 98 11/01/2022 0939   AST 20 11/01/2022 0939   AST 18 07/13/2021 0831   ALT 7 11/01/2022 0939   ALT 18 07/13/2021 0831   BILITOT 0.4 11/01/2022 0939   BILITOT 0.6 07/13/2021 0831       Lab Results  Component Value Date   WBC 4.8 11/01/2022   HGB 13.5 11/01/2022   HCT 40.6 11/01/2022   MCV 95 11/01/2022   PLT 213 11/01/2022    Lab Results  Component Value Date   TSH 3.060 11/01/2022   Total time spent on today's visit was *** minutes, including both face-to-face time and nonface-to-face time.  Time included that spent on review of records (prior notes available to me/labs/imaging if pertinent), discussing treatment and goals, answering patient's questions and coordinating care.   Cc:  [name], PA

## 2023-05-03 ENCOUNTER — Ambulatory Visit: Admitting: Neurology

## 2023-05-03 ENCOUNTER — Encounter: Payer: Self-pay | Admitting: Neurology

## 2023-05-03 VITALS — BP 118/74 | HR 76 | Wt 185.0 lb

## 2023-05-03 DIAGNOSIS — G20A1 Parkinson's disease without dyskinesia, without mention of fluctuations: Secondary | ICD-10-CM | POA: Diagnosis not present

## 2023-05-03 DIAGNOSIS — F02B3 Dementia in other diseases classified elsewhere, moderate, with mood disturbance: Secondary | ICD-10-CM

## 2023-05-08 ENCOUNTER — Ambulatory Visit: Payer: Medicare PPO | Admitting: Family Medicine

## 2023-05-09 ENCOUNTER — Other Ambulatory Visit: Payer: Self-pay | Admitting: Family Medicine

## 2023-05-09 DIAGNOSIS — R12 Heartburn: Secondary | ICD-10-CM

## 2023-05-09 DIAGNOSIS — F330 Major depressive disorder, recurrent, mild: Secondary | ICD-10-CM

## 2023-05-09 DIAGNOSIS — K219 Gastro-esophageal reflux disease without esophagitis: Secondary | ICD-10-CM

## 2023-05-09 NOTE — Progress Notes (Unsigned)
 [name] [address] [place] [address] Phone: [phone] Subjective:   [name], am serving as a scribe for Dr. Antoine Primas.  I'm seeing this patient by the request  of:  [name], PA  CC: Bilateral knee and back pain follow-up  BJY:[id number]  02/07/2023 Chronic problem with worsening symptoms.  Has had spinal stenosis and is still consistent with that at the moment.  Discussed icing regimen and home exercises, discussed avoiding certain activities that could be contributing as well.  Follow-up with me again in 7 to 8 weeks.  No new x-rays given.     Chronic problem with sever tenderness noted.  Patient still having difficulty.  Does have underlying Parkinson's that does seem to contribute to the tightness as well.  Discussed icing regimen and home exercises.  Discussed avoiding certain activities.  Follow-up with me again in 6 to 8 weeks.     Update 05/10/2023 Jamie [name] is a 89 y.o. female coming in with complaint of B knee and lumbar spine pain. Patient states that her knees are bothering her. Has had several flare ups in lower back recently. Takes IBU during increase in pain.       Past Medical History:  Diagnosis Date   Abnormality of gait 04/18/2017   Breast cancer (HCC)    Left breast mastectomy.   Degenerative joint disease of knee, right 03/19/2018   Injected April 18, 2018 Approved for Durolane R knee 3.4.20. Patient given another steroid injection Jun 24, 2018.  Repeat injection September 09, 2018 repeat injection February 27, 2019 repeat 6/110/20221 bilateral injections given December 01, 2019 Right knee injected April 20, 2020 monovisc May 19, 2018 Repeat steroid injection given June 16, 2020 repeat injection August 24, 2020   Essential hypertension 08/14/2016   Family history of breast cancer 07/14/2021   Generalized anxiety disorder 06/27/2017   History of dizziness 06/27/2017   History of hysterectomy for  benign disease 06/27/2017   History of vitamin D deficiency 08/22/2017   Leukocytes in urine 08/14/2016   Lumbar facet arthropathy 06/27/2017   Major neurocognitive disorder due to multiple etiologies 09/29/2020   Mixed hyperlipidemia 08/22/2017   Osteoarthritis of knee 09/18/2017   Pancolitis 08/14/2016   Parkinson's disease (HCC) 03/13/2017   Personal history of chemotherapy    Prediabetes 08/22/2017   Right knee pain    Spinal stenosis of lumbar region    Weakness of both hips 04/18/2017   White coat syndrome without diagnosis of hypertension 08/22/2017   Past Surgical History:  Procedure Laterality Date   BREAST BIOPSY Left    06/2021   HYSTERECTOMY ABDOMINAL WITH SALPINGECTOMY     MASTECTOMY W/ SENTINEL NODE BIOPSY Left 08/18/2021   Procedure: LEFT MASTECTOMY WITH TARGETED LYMPH NODE DISSECTION;  Surgeon: Abigail Miyamoto, MD;  Location: [facility];  Service: General;  Laterality: Left;   TONSILLECTOMY     TOTAL ABDOMINAL HYSTERECTOMY W/ BILATERAL SALPINGOOPHORECTOMY     Social History   Socioeconomic History   Marital status: Widowed    Spouse name: Not on file   Number of children: 3   Years of education: 12   Highest education level: High school graduate  Occupational History   Not on file  Tobacco Use   Smoking status: Never    Passive exposure: Never   Smokeless tobacco: Never  Vaping Use   Vaping status: Never Used  Substance and Sexual Activity   Alcohol use: No  Alcohol/week: 0.0 standard drinks of alcohol   Drug use: No   Sexual activity: Not Currently    Birth control/protection: Surgical  Other Topics Concern   Not on file  Social History Narrative   Pt lives alone in her 1 story home- she has 3 sons, and a high school graduate. She is right handed, she drinks coffee, and one soda a week mostly water   Social Drivers of Health   Financial Resource Strain: Low Risk  (09/07/2022)   Overall Financial Resource Strain (CARDIA)     Difficulty of Paying Living Expenses: Not hard at all  Food Insecurity: No Food Insecurity (09/07/2022)   Hunger Vital Sign    Worried About Running Out of Food in the Last Year: Never true    Ran Out of Food in the Last Year: Never true  Transportation Needs: No Transportation Needs (09/07/2022)   PRAPARE - Administrator, Civil Service (Medical): No    Lack of Transportation (Non-Medical): No  Physical Activity: Insufficiently Active (09/07/2022)   Exercise Vital Sign    Days of Exercise per Week: 2 days    Minutes of Exercise per Session: 30 min  Stress: No Stress Concern Present (09/07/2022)   [employer] of Occupational Health - Occupational Stress Questionnaire    Feeling of Stress : Not at all  Social Connections: Moderately Integrated (09/07/2022)   Social Connection and Isolation Panel [NHANES]    Frequency of Communication with Friends and Family: More than three times a week    Frequency of Social Gatherings with Friends and Family: More than three times a week    Attends Religious Services: More than 4 times per year    Active Member of [employer] or Organizations: Yes    Attends Banker Meetings: More than 4 times per year    Marital Status: Widowed   Allergies  Allergen Reactions   Flagyl [Metronidazole] Rash    Possible cause of rash    Family History  Problem Relation Age of Onset   Breast cancer Mother 50   Melanoma Father        dx. 100s   Lung cancer Father        dx. 10s   Healthy Son      Current Outpatient Medications (Cardiovascular):    rosuvastatin (CRESTOR) 5 MG tablet, Take 1 tablet (5 mg total) by mouth at bedtime.    Current Outpatient Medications (Hematological):    cyanocobalamin (VITAMIN B12) 500 MCG tablet,   Current Outpatient Medications (Other):    Biotin 1 MG CAPS,    carbidopa-levodopa (SINEMET CR) 50-200 MG tablet, TAKE 1 TABLET BY MOUTH AT BEDTIME.   carbidopa-levodopa (SINEMET IR) 25-100 MG tablet, TAKE 2  TABLETS BY MOUTH 3 TIMES DAILY.   cholecalciferol (VITAMIN D3) 25 MCG (1000 UNIT) tablet, Take 2,000 Units by mouth daily.   desonide (DESOWEN) 0.05 % cream, APPLY TO AFFECTED AREA ON THE SKIN 2 TIMES A DAY   donepezil (ARICEPT) 10 MG tablet, Take 1 tablet (10 mg total) by mouth in the morning.   letrozole (FEMARA) 2.5 MG tablet, TAKE 1 TABLET (2.5 MG TOTAL) BY MOUTH DAILY.   mirtazapine (REMERON) 15 MG tablet, TAKE 1 TABLET (15 MG TOTAL) BY MOUTH AT BEDTIME.   Multiple Vitamins-Minerals (PRESERVISION AREDS 2 PO), Take 1 tablet by mouth in the morning and at bedtime.    NON FORMULARY, Herblax as needed   NON FORMULARY, Cholesterol Reduction   omeprazole (PRILOSEC) 20 MG capsule,  TAKE 1 CAPSULE BY MOUTH DAILY AT 5:30 PM   Vitamin E 100 units TABS, Take 250 mg by mouth. Once a day   Reviewed prior external information including notes and imaging from  primary care provider As well as notes that were available from care everywhere and other healthcare systems.  Past medical history, social, surgical and family history all reviewed in electronic medical record.  No pertanent information unless stated regarding to the chief complaint.   Review of Systems:  No headache, visual changes, nausea, vomiting, diarrhea, constipation, dizziness, abdominal pain, skin rash, fevers, chills, night sweats, weight loss, swollen lymph nodes,  joint swelling, chest pain, shortness of breath, mood changes. POSITIVE muscle aches, body aches   Objective  Blood pressure 110/82, pulse 72, height 5\' 8"  (1.727 m), SpO2 100%.   General: No apparent distress alert and oriented x3 mood and affect normal, dressed appropriately.  HEENT: Pupils equal, extraocular movements intact  Respiratory: Patient's speak in full sentences and does not appear short of breath  Cardiovascular: No lower extremity edema, non tender, no erythema  Crepitus noted.  Does have some instability of the knees bilaterally. Antalgic gait with  shuffling  After informed written and verbal consent, patient was seated on exam table. Right knee was prepped with alcohol swab and utilizing anterolateral approach, patient's right knee space was injected with 4:1  marcaine 0.5%: Kenalog 40mg /dL. Patient tolerated the procedure well without immediate complications.  After informed written and verbal consent, patient was seated on exam table. Left knee was prepped with alcohol swab and utilizing anterolateral approach, patient's left knee space was injected with 4:1  marcaine 0.5%: Kenalog 40mg /dL. Patient tolerated the procedure well without immediate complications.    Impression and Recommendations:    The above documentation has been reviewed and is accurate and complete Judi Saa, DO

## 2023-05-10 ENCOUNTER — Encounter: Payer: Self-pay | Admitting: Family Medicine

## 2023-05-10 ENCOUNTER — Ambulatory Visit: Payer: Medicare PPO | Admitting: Family Medicine

## 2023-05-10 VITALS — BP 110/82 | HR 72 | Ht 68.0 in

## 2023-05-10 DIAGNOSIS — M170 Bilateral primary osteoarthritis of knee: Secondary | ICD-10-CM | POA: Diagnosis not present

## 2023-05-10 DIAGNOSIS — G20B1 Parkinson's disease with dyskinesia, without mention of fluctuations: Secondary | ICD-10-CM | POA: Diagnosis not present

## 2023-05-10 DIAGNOSIS — M47816 Spondylosis without myelopathy or radiculopathy, lumbar region: Secondary | ICD-10-CM | POA: Diagnosis not present

## 2023-05-10 NOTE — Assessment & Plan Note (Signed)
 Chronic problem, and tightness of the muscle with hypertonicity secondary to the Parkinson's disease as well.  Patient is not a candidate for any type of surgery.  Can repeat injection every 6 to 8 weeks if needed.  Follow-up again in 8 to 10 weeks.

## 2023-05-10 NOTE — Patient Instructions (Signed)
Injected both knees today See me again in 8-10 weeks 

## 2023-05-13 DIAGNOSIS — G20A1 Parkinson's disease without dyskinesia, without mention of fluctuations: Secondary | ICD-10-CM | POA: Diagnosis not present

## 2023-05-24 NOTE — Assessment & Plan Note (Signed)
 Known facet arthropathy.  Seems to be exacerbated secondary to the Parkinson's as well as the knees.  Will continue to monitor for potential injections may be needed.  Also has a history of osteoporosis we will need to continue to monitor.  Follow-up with me again as stated

## 2023-05-28 ENCOUNTER — Other Ambulatory Visit: Payer: Self-pay | Admitting: Neurology

## 2023-05-28 DIAGNOSIS — G20A1 Parkinson's disease without dyskinesia, without mention of fluctuations: Secondary | ICD-10-CM

## 2023-05-30 ENCOUNTER — Ambulatory Visit: Admitting: Family Medicine

## 2023-06-03 NOTE — Therapy (Unsigned)
 OUTPATIENT PHYSICAL THERAPY SOZO SCREENING NOTE   Patient Name: Jamie [name] MRN: [id number] DOB:[birth year], 89 y.o., female Today's Date: 06/04/2023  PCP: Noreene Bearded, PA REFERRING PROVIDER: Cameron Cea, MD   PT End of Session - 06/04/23 1233     Visit Number 18    PT Start Time 1150    PT Stop Time 1155    PT Time Calculation (min) 5 min    Activity Tolerance Patient tolerated treatment well    Behavior During Therapy [facility] for tasks assessed/performed             Past Medical History:  Diagnosis Date   Abnormality of gait 04/18/2017   Breast cancer (HCC)    Left breast mastectomy.   Degenerative joint disease of knee, right 03/19/2018   Injected April 18, 2018 Approved for Durolane R knee 3.4.20. Patient given another steroid injection Jun 24, 2018.  Repeat injection September 09, 2018 repeat injection February 27, 2019 repeat 6/110/20221 bilateral injections given December 01, 2019 Right knee injected April 20, 2020 monovisc May 19, 2018 Repeat steroid injection given June 16, 2020 repeat injection August 24, 2020   Essential hypertension 08/14/2016   Family history of breast cancer 07/14/2021   Generalized anxiety disorder 06/27/2017   History of dizziness 06/27/2017   History of hysterectomy for benign disease 06/27/2017   History of vitamin D deficiency 08/22/2017   Leukocytes in urine 08/14/2016   Lumbar facet arthropathy 06/27/2017   Major neurocognitive disorder due to multiple etiologies 09/29/2020   Mixed hyperlipidemia 08/22/2017   Osteoarthritis of knee 09/18/2017   Pancolitis 08/14/2016   Parkinson's disease (HCC) 03/13/2017   Personal history of chemotherapy    Prediabetes 08/22/2017   Right knee pain    Spinal stenosis of lumbar region    Weakness of both hips 04/18/2017   White coat syndrome without diagnosis of hypertension 08/22/2017   Past Surgical History:  Procedure Laterality Date   BREAST BIOPSY Left    06/2021   HYSTERECTOMY  ABDOMINAL WITH SALPINGECTOMY     MASTECTOMY W/ SENTINEL NODE BIOPSY Left 08/18/2021   Procedure: LEFT MASTECTOMY WITH TARGETED LYMPH NODE DISSECTION;  Surgeon: Oza Blumenthal, MD;  Location: [facility];  Service: General;  Laterality: Left;   TONSILLECTOMY     TOTAL ABDOMINAL HYSTERECTOMY W/ BILATERAL SALPINGOOPHORECTOMY     Patient Active Problem List   Diagnosis Date Noted   Osteoarthritis, hip, bilateral 09/07/2022   Lymphedema of left arm 09/07/2022   Gastroesophageal reflux disease without esophagitis 03/19/2022   S/P left mastectomy 08/18/2021   Malignant neoplasm of lower-inner quadrant of left breast in female, estrogen receptor positive (HCC) 07/11/2021   Mild episode of recurrent major depressive disorder (HCC) 04/06/2021   Age-related osteoporosis without current pathological fracture 04/06/2021   Greater trochanteric bursitis, left 01/21/2021   Major neurocognitive disorder due to multiple etiologies 09/29/2020   Osteoarthritis of knees, bilateral 03/19/2018   Osteoarthritis of knee 09/18/2017   Mixed diabetic hyperlipidemia associated with type 2 diabetes mellitus (HCC) 08/22/2017   Type 2 diabetes mellitus (HCC) 08/22/2017   White coat syndrome without diagnosis of hypertension 08/22/2017   Lumbar facet arthropathy 06/27/2017   Generalized anxiety disorder 06/27/2017   Overweight (BMI 25.0-29.9) 06/27/2017   Abnormality of gait 04/18/2017   Spinal stenosis of lumbar region 04/18/2017   Parkinson's disease (HCC) 03/13/2017   Pancolitis 08/14/2016   Essential hypertension 08/14/2016    REFERRING DIAG: left breast cancer at risk for lymphedema  THERAPY DIAG:  Aftercare following surgery for neoplasm  PERTINENT HISTORY: Patient was diagnosed on 06/27/2021 with left grade 2 invasive ductal carcinoma breast cancer. She underwent a left mastectomy and targeted axillary lymph node dissection (7/7 nodes positive) on 08/18/2021. It is triple positive with a  Ki67 of 40%. She had radiation. Patient has cognitive deficits effecting memory and was diagnosed with Parkinson's in 2020   PRECAUTIONS: left UE Lymphedema risk, Other: cognition  SUBJECTIVE: Here for SOZO screen  PAIN:  Are you having pain? No  SOZO SCREENING: Patient was assessed today using the SOZO machine to determine the lymphedema index score. This was compared to her baseline score. It was determined that she is within the recommended range when compared to her baseline and no further action is needed at this time. She will continue SOZO screenings. These are done every 3 months for 2 years post operatively followed by every 6 months for 2 years, and then annually.   L-DEX FLOWSHEETS - 06/04/23 1200       L-DEX LYMPHEDEMA SCREENING   Measurement Type Unilateral    L-DEX MEASUREMENT EXTREMITY Upper Extremity    POSITION  Standing    DOMINANT SIDE Right    At Risk Side Left    BASELINE SCORE (UNILATERAL) 0.1    L-DEX SCORE (UNILATERAL) 0.6    VALUE CHANGE (UNILAT) 0.5            Rollin Clock, PT 06/04/23 12:34 PM

## 2023-06-04 ENCOUNTER — Ambulatory Visit: Payer: Medicare PPO | Attending: Hematology and Oncology | Admitting: Physical Therapy

## 2023-06-04 ENCOUNTER — Encounter: Payer: Self-pay | Admitting: Physical Therapy

## 2023-06-04 DIAGNOSIS — Z483 Aftercare following surgery for neoplasm: Secondary | ICD-10-CM | POA: Insufficient documentation

## 2023-06-05 ENCOUNTER — Other Ambulatory Visit: Payer: Self-pay

## 2023-06-14 LAB — HM DIABETES EYE EXAM

## 2023-07-04 ENCOUNTER — Inpatient Hospital Stay: Payer: Medicare PPO | Attending: Hematology and Oncology | Admitting: Hematology and Oncology

## 2023-07-04 VITALS — BP 165/73 | HR 80 | Temp 98.8°F | Resp 18 | Ht 68.0 in | Wt 183.8 lb

## 2023-07-04 DIAGNOSIS — C50312 Malignant neoplasm of lower-inner quadrant of left female breast: Secondary | ICD-10-CM | POA: Diagnosis not present

## 2023-07-04 DIAGNOSIS — Z79811 Long term (current) use of aromatase inhibitors: Secondary | ICD-10-CM | POA: Diagnosis not present

## 2023-07-04 DIAGNOSIS — Z170 Estrogen receptor positive status [ER+]: Secondary | ICD-10-CM

## 2023-07-04 NOTE — Assessment & Plan Note (Signed)
 07/09/2021:Skin changes in the left breast, screening mammogram revealed thickening of the lower medial quadrant along with enlarged lymph node in the axilla (2 additional lymph nodes) by ultrasound breast tumor measured 3.9 cm, biopsy: Grade 2 IDC with lymphovascular invasion ER 100%, PR 100%, HER2 positive Ki-67 40%, lymph node biopsy positive   08/18/21: Left Mastectomy: Grade 3 IDC with micropapillary features with DCIS 5.8 cm 7/7 LN Positives, involves dermal stroma and lymphatics with focal Extra nodal invasion   09/12/2021: Bone scan focal radiotracer uptake right lateral femoral condyle (knee x-ray recommended) 09/12/2021: CT CAP: Several small left axillary lymph nodes.  No evidence of distant metastatic disease.  2 cm right thyroid  nodule ------------------------------------------------------------------------------------------------------------------------  Treatment summary: 1. adjuvant Herceptin  with aromatase inhibitors started 09/14/2021-07/04/2022 2.  Adjuvant radiation completed 11/09/2021  Current treatment: Letrozole  started 09/14/2021   Letrozole  toxicities: Denies any adverse effects to letrozole . Right knee swelling: The knee was drained and she is feeling much better.     Breast cancer surveillance: Breast exam 07/04/2023: Benign Mammogram 10/27/2022: Benign breast density category B   Return to clinic in 1 year for follow-up

## 2023-07-04 NOTE — Progress Notes (Signed)
 Patient Care Team: Noreene Bearded, PA as PCP - General (Family Medicine) Dorsey Gault, MD as Consulting Physician (Cardiology) Glory Larsen, MD as Referring Physician (Dermatology) Rehabilitation, [place], [place], DO as Consulting Physician (Neurology) Auther Bo, RN as Oncology Nurse Navigator Alane Hsu, RN as Oncology Nurse Navigator Oza Blumenthal, MD as Consulting Physician (General Surgery) Cameron Cea, MD as Consulting Physician (Hematology and Oncology) Colie Dawes, MD as Attending Physician (Radiation Oncology)  DIAGNOSIS:  Encounter Diagnosis  Name Primary?   Malignant neoplasm of lower-inner quadrant of left breast in female, estrogen receptor positive (HCC) Yes    SUMMARY OF ONCOLOGIC HISTORY: Oncology History  Malignant neoplasm of lower-inner quadrant of left breast in female, estrogen receptor positive (HCC)  07/11/2021 Initial Diagnosis   Skin changes in the left breast, screening mammogram revealed thickening of the lower medial quadrant along with enlarged lymph node in the axilla (2 additional lymph nodes) by ultrasound breast tumor measured 3.9 cm, biopsy: Grade 2 IDC with lymphovascular invasion ER 100%, PR 100%, HER2 positive Ki-67 40%, lymph node biopsy positive   07/13/2021 Cancer Staging   Staging form: Breast, AJCC 8th Edition - Clinical: Stage IB (cT2, cN1, cM0, G2, ER+, PR+, HER2+) - Signed by Cameron Cea, MD on 07/13/2021 Stage prefix: Initial diagnosis Histologic grading system: 3 grade system    Genetic Testing   [facility] CustomNext Panel was Negative. Report date is 07/24/2021.  The CustomNext panel offered by [employer] includes sequencing, rearrangement, and RNA analysis for the following 41 genes: APC, ATM, BAP1, BARD1, BMPR1A, BRCA1, BRCA2, BRIP1, CDH1, CDK4, CDKN2A, CHEK2, DICER1, MLH1, MSH2, MSH6, MUTYH, NBN, NF1, NTHL1, PALB2, PMS2, POT1, PTEN, RAD51C, RAD51D, RB1, RECQL, SMAD4, SMARCA4, STK11 and TP53  (sequencing and deletion/duplication); AXIN2, EGFR, HOXB13, MITF, MSH3, POLD1 and POLE (sequencing only); EPCAM and GREM1 (deletion/duplication only).    09/14/2021 - 10/04/2021 Chemotherapy   Patient is on Treatment Plan : BREAST Trastuzumab  q21d     10/25/2021 -  Chemotherapy   Patient is on Treatment Plan : BREAST MAINTENANCE Trastuzumab  IV (6) or SQ (600) D1 q21d X 11 Cycles       CHIEF COMPLIANT: Surveillance of breast cancer on letrozole  therapy  HISTORY OF PRESENT ILLNESS:   History of Present Illness Jamie [name] is an 89 year old female with breast cancer who presents for a routine follow-up.  She has been on letrozole  for two years, taking it daily, and tolerates it well without experiencing hot flashes or significant joint stiffness, except for knee pain. Her knee pain is due to osteoarthritis and is managed with injections. A mammogram in September of the previous year showed favorable results.     ALLERGIES:  is allergic to flagyl  [metronidazole ].  MEDICATIONS:  Current Outpatient Medications  Medication Sig Dispense Refill   Biotin 1 MG CAPS      carbidopa -levodopa  (SINEMET  CR) 50-200 MG tablet TAKE 1 TABLET BY MOUTH AT BEDTIME. 90 tablet 1   carbidopa -levodopa  (SINEMET  IR) 25-100 MG tablet TAKE 2 TABLETS BY MOUTH 3 TIMES DAILY. 540 tablet 0   cholecalciferol (VITAMIN D3) 25 MCG (1000 UNIT) tablet Take 2,000 Units by mouth daily.     cyanocobalamin (VITAMIN B12) 500 MCG tablet      letrozole  (FEMARA ) 2.5 MG tablet TAKE 1 TABLET (2.5 MG TOTAL) BY MOUTH DAILY. 90 tablet 3   mirtazapine  (REMERON ) 15 MG tablet TAKE 1 TABLET (15 MG TOTAL) BY MOUTH AT BEDTIME. 90 tablet 2   Multiple Vitamins-Minerals (  PRESERVISION AREDS 2 PO) Take 1 tablet by mouth in the morning and at bedtime.      NON FORMULARY Herblax as needed     NON FORMULARY Cholesterol Reduction     omeprazole  (PRILOSEC) 20 MG capsule TAKE 1 CAPSULE BY MOUTH DAILY AT 5:30 PM 90 capsule 1   rosuvastatin  (CRESTOR ) 5  MG tablet Take 1 tablet (5 mg total) by mouth at bedtime. 90 tablet 3   donepezil  (ARICEPT ) 10 MG tablet Take 1 tablet (10 mg total) by mouth in the morning.     Vitamin E 100 units TABS Take 250 mg by mouth. Once a day     No current facility-administered medications for this visit.    PHYSICAL EXAMINATION: ECOG PERFORMANCE STATUS: 1 - Symptomatic but completely ambulatory  Vitals:   07/04/23 0811  BP: (!) 165/73  Pulse: 80  Resp: 18  Temp: 98.8 F (37.1 C)  SpO2: 100%   Filed Weights   07/04/23 0811  Weight: 183 lb 12.8 oz (83.4 kg)    Physical Exam CHEST: No palpable lumps or nodules in right breast or axilla.  No lumps or nodules in the left chest wall or axilla  (exam performed in the presence of a chaperone)  LABORATORY DATA:  I have reviewed the data as listed    Latest Ref Rng & Units 11/01/2022    9:39 AM 08/02/2022    9:23 AM 09/12/2021    2:21 PM  CMP  Glucose 70 - 99 mg/dL 409  811    BUN 8 - 27 mg/dL 17  23    Creatinine 9.14 - 1.00 mg/dL 7.82  9.56  2.13   Sodium 134 - 144 mmol/L 139  142    Potassium 3.5 - 5.2 mmol/L 4.5  4.4    Chloride 96 - 106 mmol/L 106  106    CO2 20 - 29 mmol/L 21  23    Calcium  8.7 - 10.3 mg/dL 08.6  57.8    Total Protein 6.0 - 8.5 g/dL 6.7  6.6    Total Bilirubin 0.0 - 1.2 mg/dL 0.4  0.4    Alkaline Phos 44 - 121 IU/L 98  108    AST 0 - 40 IU/L 20  24    ALT 0 - 32 IU/L 7  10      Lab Results  Component Value Date   WBC 4.8 11/01/2022   HGB 13.5 11/01/2022   HCT 40.6 11/01/2022   MCV 95 11/01/2022   PLT 213 11/01/2022   NEUTROABS 3.1 11/01/2022    ASSESSMENT & PLAN:  Malignant neoplasm of lower-inner quadrant of left breast in female, estrogen receptor positive (HCC) 07/09/2021:Skin changes in the left breast, screening mammogram revealed thickening of the lower medial quadrant along with enlarged lymph node in the axilla (2 additional lymph nodes) by ultrasound breast tumor measured 3.9 cm, biopsy: Grade 2 IDC with  lymphovascular invasion ER 100%, PR 100%, HER2 positive Ki-67 40%, lymph node biopsy positive   08/18/21: Left Mastectomy: Grade 3 IDC with micropapillary features with DCIS 5.8 cm 7/7 LN Positives, involves dermal stroma and lymphatics with focal Extra nodal invasion   09/12/2021: Bone scan focal radiotracer uptake right lateral femoral condyle (knee x-ray recommended) 09/12/2021: CT CAP: Several small left axillary lymph nodes.  No evidence of distant metastatic disease.  2 cm right thyroid  nodule ------------------------------------------------------------------------------------------------------------------------  Treatment summary: 1. adjuvant Herceptin  with aromatase inhibitors started 09/14/2021-07/04/2022 2.  Adjuvant radiation completed 11/09/2021  Current treatment: Letrozole   started 09/14/2021   Letrozole  toxicities: Denies any adverse effects to letrozole . Right knee osteoarthritis: Receives injections periodically   Breast cancer surveillance: Breast exam 07/04/2023: Benign Mammogram 10/27/2022: Benign breast density category B   Return to clinic in 1 year for follow-up     No orders of the defined types were placed in this encounter.  The patient has a good understanding of the overall plan. she agrees with it. she will call with any problems that may develop before the next visit here. Total time spent: 30 mins including face to face time and time spent for planning, charting and co-ordination of care   Viinay K Keoshia Steinmetz, MD 07/04/23

## 2023-07-04 NOTE — Progress Notes (Unsigned)
 [facility] [address] [place] [address] Phone: [phone] Subjective:   Jamie [name], am serving as a scribe for Dr. Ronnell Coins.  I'm seeing this patient by the request  of:  Noreene Bearded, PA  CC: Bilateral knee pain  BJY:[id number]  05/10/2023 Chronic problem, and tightness of the muscle with hypertonicity secondary to the Parkinson's disease as well. Patient is not a candidate for any type of surgery. Can repeat injection every 6 to 8 weeks if needed. Follow-up again in 8 to 10 weeks.    Update 07/05/2023 Jamie [name] is a 89 y.o. female coming in with complaint of B knee pain. Patient states that she is in constnat pain.       Past Medical History:  Diagnosis Date   Abnormality of gait 04/18/2017   Breast cancer (HCC)    Left breast mastectomy.   Degenerative joint disease of knee, right 03/19/2018   Injected April 18, 2018 Approved for Durolane R knee 3.4.20. Patient given another steroid injection Jun 24, 2018.  Repeat injection September 09, 2018 repeat injection February 27, 2019 repeat 6/110/20221 bilateral injections given December 01, 2019 Right knee injected April 20, 2020 monovisc May 19, 2018 Repeat steroid injection given June 16, 2020 repeat injection August 24, 2020   Essential hypertension 08/14/2016   Family history of breast cancer 07/14/2021   Generalized anxiety disorder 06/27/2017   History of dizziness 06/27/2017   History of hysterectomy for benign disease 06/27/2017   History of vitamin D  deficiency 08/22/2017   Leukocytes in urine 08/14/2016   Lumbar facet arthropathy 06/27/2017   Major neurocognitive disorder due to multiple etiologies 09/29/2020   Mixed hyperlipidemia 08/22/2017   Osteoarthritis of knee 09/18/2017   Pancolitis 08/14/2016   Parkinson's disease (HCC) 03/13/2017   Personal history of chemotherapy    Prediabetes 08/22/2017   Right knee pain    Spinal stenosis of lumbar region     Weakness of both hips 04/18/2017   White coat syndrome without diagnosis of hypertension 08/22/2017   Past Surgical History:  Procedure Laterality Date   BREAST BIOPSY Left    06/2021   HYSTERECTOMY ABDOMINAL WITH SALPINGECTOMY     MASTECTOMY W/ SENTINEL NODE BIOPSY Left 08/18/2021   Procedure: LEFT MASTECTOMY WITH TARGETED LYMPH NODE DISSECTION;  Surgeon: Oza Blumenthal, MD;  Location: [facility];  Service: General;  Laterality: Left;   TONSILLECTOMY     TOTAL ABDOMINAL HYSTERECTOMY W/ BILATERAL SALPINGOOPHORECTOMY     Social History   Socioeconomic History   Marital status: Widowed    Spouse name: Not on file   Number of children: 3   Years of education: 12   Highest education level: High school graduate  Occupational History   Not on file  Tobacco Use   Smoking status: Never    Passive exposure: Never   Smokeless tobacco: Never  Vaping Use   Vaping status: Never Used  Substance and Sexual Activity   Alcohol use: No    Alcohol/week: 0.0 standard drinks of alcohol   Drug use: No   Sexual activity: Not Currently    Birth control/protection: Surgical  Other Topics Concern   Not on file  Social History Narrative   Pt lives alone in her 1 story home- she has 3 sons, and a high school graduate. She is right handed, she drinks coffee, and one soda a week mostly water   Social Drivers of Health   Financial Resource Strain:  Low Risk  (09/07/2022)   Overall Financial Resource Strain (CARDIA)    Difficulty of Paying Living Expenses: Not hard at all  Food Insecurity: No Food Insecurity (09/07/2022)   Hunger Vital Sign    Worried About Running Out of Food in the Last Year: Never true    Ran Out of Food in the Last Year: Never true  Transportation Needs: No Transportation Needs (09/07/2022)   PRAPARE - Administrator, Civil Service (Medical): No    Lack of Transportation (Non-Medical): No  Physical Activity: Insufficiently Active (09/07/2022)    Exercise Vital Sign    Days of Exercise per Week: 2 days    Minutes of Exercise per Session: 30 min  Stress: No Stress Concern Present (09/07/2022)   [employer] of Occupational Health - Occupational Stress Questionnaire    Feeling of Stress : Not at all  Social Connections: Moderately Integrated (09/07/2022)   Social Connection and Isolation Panel [NHANES]    Frequency of Communication with Friends and Family: More than three times a week    Frequency of Social Gatherings with Friends and Family: More than three times a week    Attends Religious Services: More than 4 times per year    Active Member of [employer] or Organizations: Yes    Attends Banker Meetings: More than 4 times per year    Marital Status: Widowed   Allergies  Allergen Reactions   Flagyl  [Metronidazole ] Rash    Possible cause of rash    Family History  Problem Relation Age of Onset   Breast cancer Mother 19   Melanoma Father        dx. 54s   Lung cancer Father        dx. 70s   Healthy Son      Current Outpatient Medications (Cardiovascular):    rosuvastatin  (CRESTOR ) 5 MG tablet, Take 1 tablet (5 mg total) by mouth at bedtime.    Current Outpatient Medications (Hematological):    cyanocobalamin (VITAMIN B12) 500 MCG tablet,   Current Outpatient Medications (Other):    Biotin 1 MG CAPS,    carbidopa -levodopa  (SINEMET  CR) 50-200 MG tablet, TAKE 1 TABLET BY MOUTH AT BEDTIME.   carbidopa -levodopa  (SINEMET  IR) 25-100 MG tablet, TAKE 2 TABLETS BY MOUTH 3 TIMES DAILY.   cholecalciferol (VITAMIN D3) 25 MCG (1000 UNIT) tablet, Take 2,000 Units by mouth daily.   donepezil  (ARICEPT ) 10 MG tablet, Take 1 tablet (10 mg total) by mouth in the morning.   letrozole  (FEMARA ) 2.5 MG tablet, TAKE 1 TABLET (2.5 MG TOTAL) BY MOUTH DAILY.   mirtazapine  (REMERON ) 15 MG tablet, TAKE 1 TABLET (15 MG TOTAL) BY MOUTH AT BEDTIME.   Multiple Vitamins-Minerals (PRESERVISION AREDS 2 PO), Take 1 tablet by mouth in the  morning and at bedtime.    NON FORMULARY, Herblax as needed   NON FORMULARY, Cholesterol Reduction   omeprazole  (PRILOSEC) 20 MG capsule, TAKE 1 CAPSULE BY MOUTH DAILY AT 5:30 PM   Vitamin E 100 units TABS, Take 250 mg by mouth. Once a day   Reviewed prior external information including notes and imaging from  primary care provider As well as notes that were available from care everywhere and other healthcare systems.  Past medical history, social, surgical and family history all reviewed in electronic medical record.  No pertanent information unless stated regarding to the chief complaint.   Review of Systems:  No headache, visual changes, nausea, vomiting, diarrhea, constipation, dizziness, abdominal pain, skin rash,  fevers, chills, night sweats, weight loss, swollen lymph nodes, body aches, joint swelling, chest pain, shortness of breath, mood changes. POSITIVE muscle aches  Objective  Blood pressure 108/78, pulse 68, height 5\' 8"  (1.727 m), weight 186 lb (84.4 kg), SpO2 98%.   General: No apparent distress alert and oriented x3 mood and affect normal, dressed appropriately.  HEENT: Pupils equal, extraocular movements intact  Respiratory: Patient's speak in full sentences and does not appear short of breath  Shuffling gait noted.  Patient does have rigidity noted.  No significant effusion noted of the knees bilaterally.  After informed written and verbal consent, patient was seated on exam table. Right knee was prepped with alcohol swab and utilizing anterolateral approach, patient's right knee space was injected with 4:1  marcaine  0.5%: Kenalog  40mg /dL. Patient tolerated the procedure well without immediate complications.  After informed written and verbal consent, patient was seated on exam table. Left knee was prepped with alcohol swab and utilizing anterolateral approach, patient's left knee space was injected with 4:1  marcaine  0.5%: Kenalog  40mg /dL. Patient tolerated the procedure  well without immediate complications.   Impression and Recommendations:     The above documentation has been reviewed and is accurate and complete Jamie Haggar M Goddess Gebbia, DO

## 2023-07-05 ENCOUNTER — Ambulatory Visit: Admitting: Family Medicine

## 2023-07-05 ENCOUNTER — Encounter: Payer: Self-pay | Admitting: Family Medicine

## 2023-07-05 ENCOUNTER — Other Ambulatory Visit: Payer: Self-pay

## 2023-07-05 VITALS — BP 108/78 | HR 68 | Ht 68.0 in | Wt 186.0 lb

## 2023-07-05 DIAGNOSIS — M170 Bilateral primary osteoarthritis of knee: Secondary | ICD-10-CM

## 2023-07-05 DIAGNOSIS — G20B1 Parkinson's disease with dyskinesia, without mention of fluctuations: Secondary | ICD-10-CM

## 2023-07-05 NOTE — Assessment & Plan Note (Signed)
 Severe nearly bone-on-bone arthritic changes of the knees noted.  Patient has the underlying Parkinson's disease that does seem to cause more difficulty as well.  Discussed icing regimen and home exercises, which activities to do and which ones to avoid.  Increase activity slowly otherwise.  Discussed icing regimen.  Discussed home exercises.  Follow-up again in 8 to 10 weeks weeks otherwise.  Due to patient's comorbidities patient is not a surgical candidate.  Social determinants of health include decreased physical activity with patient being a relatively significant fall risk.

## 2023-07-05 NOTE — Patient Instructions (Signed)
 Injected both knees today See me again in 10-12 weeks

## 2023-07-14 ENCOUNTER — Other Ambulatory Visit: Payer: Self-pay | Admitting: Neurology

## 2023-07-14 DIAGNOSIS — G20A1 Parkinson's disease without dyskinesia, without mention of fluctuations: Secondary | ICD-10-CM

## 2023-07-14 DIAGNOSIS — F02B3 Dementia in other diseases classified elsewhere, moderate, with mood disturbance: Secondary | ICD-10-CM

## 2023-07-24 NOTE — Progress Notes (Unsigned)
 Assessment/Plan:   1.  Parkinsons Disease/SWEDD  -continue carbidopa /levodopa  25/100, 2 tablets 3 times daily for now.  ***They are going to send me a video of the movements, so that we can differentiate whether she is having tremor at the end of the day or dyskinesia.  If dyskinesia, we likely will not trial changing anything.  If tremor, then they may trial an extra half to 1 tablet of levodopa  and see if it helps.  If it does, then we will likely just have her start taking carbidopa /levodopa  25/100, 2 tablets at 8:30 AM, 2 tablets at noon, 2 tablets at 3:30 PM and 1 tablet at 6:30 PM.  These can always be moved into 3-hour intervals if necessary.  They will email me next week with the above information.  -*** carbidopa /levodopa  50/200 CR 30 min prior to bedtime  2.  Insomnia  -Has received benefit with melatonin, 2.5 mg nightly and will continue that  3.  Depression  -Continue Lexapro , 20 mg daily.    -On mirtazapine , 15 mg at bedtime by primary care for mood and appetite.    4.  Dementia, mixed type, PDD and AD  -This was identified on neurocognitive testing in August, 2022 and getting worse with time  -pt has caregivers with her in the home most of the day/night (all but 2-4pm)  -continue donepezil  to 10 mg daily 5.  Breast cancer  -Status post left mastectomy, currently undergoing chemotherapy.  Has completed Herceptin   6.  Urinary incontinence at night  -saw Dr. Augustus Ledger one time and doing well.  7.  History of blood pressure fluctuations  -We discussed concept of permissive hypertension in Parkinsons.  I agree with primary care that I would not want to start new blood pressure medication, especially given the fact that she has had blood pressures running in the 90s systolic at times.  I would rather see highs than lows.  Subjective:   Jamie [name] was seen today in follow up for Parkinsons disease.  My previous records were reviewed prior to todays visit as well as  outside records available to me.  Patient with daughter in law who supplements the history.  I saw the patient in March and after that visit they were going to send me a video of the movements that she was having at the end of the day, so we could see if she was having tremor or dyskinesia.  We discussed that if it was tremor, we may try adding an extra half to 1 tablet of levodopa  at the end of the day, but if it was dyskinesia we likely would not change much of anything.  I also asked them to move her bedtime levodopa  to 30 minutes before bedtime.  I did not receive any videos after our last visit.  Current prescribed movement disorder medications: Carbidopa /levodopa  25/100, 2 tablets 3 times per day  Carbidopa /levodopa  50/200 CR at bedtime  Melatonin, 3 mg daily Lexapro  20 mg Mirtazapine , 15 mg nightly Donepezil , 10 mg daily    ALLERGIES:   Allergies  Allergen Reactions   Flagyl  [Metronidazole ] Rash    Possible cause of rash     CURRENT MEDICATIONS:  Outpatient Encounter Medications as of 07/26/2023  Medication Sig   Biotin 1 MG CAPS    carbidopa -levodopa  (SINEMET  CR) 50-200 MG tablet TAKE 1 TABLET BY MOUTH AT BEDTIME.   carbidopa -levodopa  (SINEMET  IR) 25-100 MG tablet TAKE 2 TABLETS BY MOUTH 3 TIMES DAILY.   cholecalciferol (VITAMIN D3)  25 MCG (1000 UNIT) tablet Take 2,000 Units by mouth daily.   cyanocobalamin (VITAMIN B12) 500 MCG tablet    donepezil  (ARICEPT ) 10 MG tablet TAKE 1 TABLET (10 MG TOTAL) BY MOUTH AT BEDTIME.   letrozole  (FEMARA ) 2.5 MG tablet TAKE 1 TABLET (2.5 MG TOTAL) BY MOUTH DAILY.   mirtazapine  (REMERON ) 15 MG tablet TAKE 1 TABLET (15 MG TOTAL) BY MOUTH AT BEDTIME.   Multiple Vitamins-Minerals (PRESERVISION AREDS 2 PO) Take 1 tablet by mouth in the morning and at bedtime.    NON FORMULARY Herblax as needed   NON FORMULARY Cholesterol Reduction   omeprazole  (PRILOSEC) 20 MG capsule TAKE 1 CAPSULE BY MOUTH DAILY AT 5:30 PM   rosuvastatin  (CRESTOR ) 5 MG tablet  Take 1 tablet (5 mg total) by mouth at bedtime.   Vitamin E 100 units TABS Take 250 mg by mouth. Once a day   [DISCONTINUED] donepezil  (ARICEPT ) 5 MG tablet Take 1 tablet (5 mg total) by mouth at bedtime.   No facility-administered encounter medications on file as of 07/26/2023.    Objective:   PHYSICAL EXAMINATION:    VITALS:   There were no vitals filed for this visit.  Wt Readings from Last 3 Encounters:  07/05/23 186 lb (84.4 kg)  07/04/23 183 lb 12.8 oz (83.4 kg)  05/03/23 185 lb (83.9 kg)    GEN:  The patient appears stated age and is in NAD. HEENT:  Normocephalic, atraumatic.  The mucous membranes are moist.  CV:  RRR Lungs:  CTAB  Neurological examination:  Orientation: The patient is alert and oriented x3. Cranial nerves: There is good facial symmetry with significant facial hypomimia. The speech is fluent and clear. Soft palate rises symmetrically and there is no tongue deviation. Hearing is intact to conversational tone. Sensation: Sensation is intact to light touch throughout Motor: Strength is at least antigravity x4.  Movement examination: Tone: There is no significant rigidity today. Abnormal movements: None Coordination:  There is mild decremation with finger taps on the R Gait and Station: The patient pushes off to arise.  She is slow with short steps and drags the R leg.  There is festination  I have reviewed and interpreted the following labs independently    Chemistry      Component Value Date/Time   NA 139 11/01/2022 0939   K 4.5 11/01/2022 0939   CL 106 11/01/2022 0939   CO2 21 11/01/2022 0939   BUN 17 11/01/2022 0939   CREATININE 0.75 11/01/2022 0939   CREATININE 0.86 07/13/2021 0831   GLU 98 05/30/2017 0000      Component Value Date/Time   CALCIUM  10.7 (H) 11/01/2022 0939   ALKPHOS 98 11/01/2022 0939   AST 20 11/01/2022 0939   AST 18 07/13/2021 0831   ALT 7 11/01/2022 0939   ALT 18 07/13/2021 0831   BILITOT 0.4 11/01/2022 0939    BILITOT 0.6 07/13/2021 0831       Lab Results  Component Value Date   WBC 4.8 11/01/2022   HGB 13.5 11/01/2022   HCT 40.6 11/01/2022   MCV 95 11/01/2022   PLT 213 11/01/2022    Lab Results  Component Value Date   TSH 3.060 11/01/2022   Total time spent on today's visit was 30 minutes, including both face-to-face time and nonface-to-face time.  Time included that spent on review of records (prior notes available to me/labs/imaging if pertinent), discussing treatment and goals, answering patient's questions and coordinating care.   Cc:  Meryl Acosta  F, PA-C

## 2023-07-26 ENCOUNTER — Encounter: Payer: Self-pay | Admitting: Neurology

## 2023-07-26 ENCOUNTER — Ambulatory Visit: Payer: Medicare PPO | Admitting: Neurology

## 2023-07-26 VITALS — BP 124/78 | HR 72 | Ht 68.0 in | Wt 187.4 lb

## 2023-07-26 DIAGNOSIS — G20A1 Parkinson's disease without dyskinesia, without mention of fluctuations: Secondary | ICD-10-CM | POA: Diagnosis not present

## 2023-07-26 NOTE — Patient Instructions (Signed)
 SAVE THE DATE!  We will have a Parkinsons Disease symposium on 9/19 at the [facility]

## 2023-08-02 ENCOUNTER — Ambulatory Visit

## 2023-08-02 NOTE — Progress Notes (Deleted)
   Established Patient Office Visit  Subjective   Patient ID: Jamie [name], female    DOB: 1934/11/13  Age: 89 y.o. MRN: [id number]  No chief complaint on file.   HPI  Jamie [name] is a 89 y.o. female presenting today for follow up of hypertension, diabetes. Hypertension: Pt denies chest pain, SOB, dizziness, edema, syncope, fatigue or heart palpitations.  Not currently taking any medication.  Checking blood pressure at home, it does fluctuate quite a bit but generally remains under 130 systolic.  Diabetes: denies hypoglycemic events, wounds or sores that are not healing well, increased thirst or urination. Denies vision problems, eye exam upcoming in April at [facility].  Currently managing with nutrition and lifestyle.     HLD: Currently taking rosuvastatin  5 mg daily. Denies side effects or myaglias.    {History (Optional):23778}  ROS Per HPI.    Objective:     There were no vitals taken for this visit. {Vitals History (Optional):23777}  Physical Exam   No results found for any visits on 08/02/23.  {Labs (Optional):23779}  The ASCVD Risk score (Arnett DK, et al., 2019) failed to calculate for the following reasons:   The 2019 ASCVD risk score is only valid for ages 58 to 43    Assessment & Plan:   There are no diagnoses linked to this encounter.  Assessment and Plan              No follow-ups on file.    Odilia Bennett, PA-C

## 2023-08-09 ENCOUNTER — Ambulatory Visit

## 2023-08-09 VITALS — BP 147/81 | HR 82 | Ht 68.0 in | Wt 187.1 lb

## 2023-08-09 DIAGNOSIS — R030 Elevated blood-pressure reading, without diagnosis of hypertension: Secondary | ICD-10-CM | POA: Diagnosis not present

## 2023-08-09 DIAGNOSIS — E1169 Type 2 diabetes mellitus with other specified complication: Secondary | ICD-10-CM | POA: Diagnosis not present

## 2023-08-09 DIAGNOSIS — E782 Mixed hyperlipidemia: Secondary | ICD-10-CM | POA: Diagnosis not present

## 2023-08-09 DIAGNOSIS — E119 Type 2 diabetes mellitus without complications: Secondary | ICD-10-CM

## 2023-08-09 NOTE — Patient Instructions (Addendum)
 It was nice to see you today!  As we discussed in clinic:  -Continue monitoring blood pressure at home and notify me if you are consistently getting readings >130/80! -I am ordered labs today to check basic blood work. You will hear from me via MyChart in the coming days explaining these results. -Continue all of your current medications as prescribed.  -I will plan to see you back in  6 months for a follow up!   It was so nice to meet you!   If you have any problems before your next visit feel free to message me via MyChart (minor issues or questions) or call the office, otherwise you may reach out to schedule an office visit.  Thank you! Meryl Acosta, PA-C

## 2023-08-09 NOTE — Assessment & Plan Note (Signed)
 2 attempts were made to perform point-of-care A1c today, however unsuccessful due to machine malfunction.  Will check A1c with other ordered blood work next week.  Continue to limit sugar and carbs.  Diabetic eye exam up-to-date (will make an attempt to request records).  Foot exam and UACR are up-to-date for the year.

## 2023-08-09 NOTE — Progress Notes (Signed)
 Established Patient Office Visit  Subjective   Patient ID: Jamie [name], female    DOB: 1934/08/18  Age: 89 y.o. MRN: [id number]  Chief Complaint  Patient presents with   Medical Management of Chronic Issues    HPI  Jamie [name] is a 89 y.o. y/o female who presents to the clinic today for follow up on HTN, HLD, DM. She is accompanied today by her daughter-in-law.  HTN: Patient not currently taking medication for blood pressure. Checks BP at home periodically and reports readings have been at goal. Denies CP, SOB, Palpitations, vision changes, HA, or edema.  HLD: Patient currently taking rosuvastatin  5 mg daily for their cholesterol. Reports excellent compliance with this medication. Denies side effects/new myalgias.   DM: Patient not currently on medication for management of diabetes. Controlling with diet and exercise. They don't check BG at home. Denies episodes of hypoglycemia. Denies new sores/wounds that are not healing.      ROS Per HPI.    Objective:     BP (!) 147/81   Pulse 82   Ht 5' 8 (1.727 m)   Wt 187 lb 1.3 oz (84.9 kg)   SpO2 99%   BMI 28.45 kg/m    Physical Exam Constitutional:      General: She is not in acute distress.    Appearance: Normal appearance.   Cardiovascular:     Rate and Rhythm: Normal rate and regular rhythm.     Heart sounds: Normal heart sounds. No murmur heard.    No friction rub. No gallop.  Pulmonary:     Effort: Pulmonary effort is normal. No respiratory distress.     Breath sounds: Normal breath sounds.   Musculoskeletal:        General: No swelling.   Skin:    General: Skin is warm and dry.   Neurological:     General: No focal deficit present.     Mental Status: She is alert.   Psychiatric:        Mood and Affect: Mood normal.        Behavior: Behavior normal.        Thought Content: Thought content normal.     No results found for any visits on 08/09/23.    The ASCVD Risk score (Arnett DK, et  al., 2019) failed to calculate for the following reasons:   The 2019 ASCVD risk score is only valid for ages 16 to 42    Assessment & Plan:   Type 2 diabetes mellitus without complication, without long-term current use of insulin (HCC) Assessment & Plan: 2 attempts were made to perform point-of-care A1c today, however unsuccessful due to machine malfunction.  Will check A1c with other ordered blood work next week.  Continue to limit sugar and carbs.  Diabetic eye exam up-to-date (will make an attempt to request records).  Foot exam and UACR are up-to-date for the year.  Orders: -     POCT glycosylated hemoglobin (Hb A1C)  White coat syndrome without diagnosis of hypertension Assessment & Plan: Encouraged patient to continue ambulatory blood pressure monitoring and notify for persistent readings >130/80.  Will continue to monitor.   Mixed diabetic hyperlipidemia associated with type 2 diabetes mellitus (HCC) Assessment & Plan: Updating lipid panel and CMP today.  Will follow-up with results.  Continue rosuvastatin  5 mg daily.  Will continue to monitor.    Return in about 6 months (around 02/08/2024) for HTN, DM, HLD.    Odilia Bennett, PA-C

## 2023-08-09 NOTE — Assessment & Plan Note (Signed)
 Encouraged patient to continue ambulatory blood pressure monitoring and notify for persistent readings >130/80.  Will continue to monitor.

## 2023-08-09 NOTE — Assessment & Plan Note (Signed)
 Updating lipid panel and CMP today.  Will follow-up with results.  Continue rosuvastatin  5 mg daily.  Will continue to monitor.

## 2023-08-21 ENCOUNTER — Other Ambulatory Visit: Payer: Self-pay | Admitting: *Deleted

## 2023-08-21 DIAGNOSIS — E119 Type 2 diabetes mellitus without complications: Secondary | ICD-10-CM

## 2023-08-21 DIAGNOSIS — I10 Essential (primary) hypertension: Secondary | ICD-10-CM

## 2023-08-21 DIAGNOSIS — E782 Mixed hyperlipidemia: Secondary | ICD-10-CM

## 2023-08-22 ENCOUNTER — Other Ambulatory Visit

## 2023-08-22 DIAGNOSIS — E119 Type 2 diabetes mellitus without complications: Secondary | ICD-10-CM

## 2023-08-22 DIAGNOSIS — I10 Essential (primary) hypertension: Secondary | ICD-10-CM

## 2023-08-22 DIAGNOSIS — E782 Mixed hyperlipidemia: Secondary | ICD-10-CM

## 2023-08-23 ENCOUNTER — Ambulatory Visit: Payer: Self-pay

## 2023-08-23 ENCOUNTER — Encounter: Payer: Self-pay | Admitting: Hematology and Oncology

## 2023-08-23 LAB — CBC WITH DIFFERENTIAL/PLATELET
Basophils Absolute: 0.1 10*3/uL (ref 0.0–0.2)
Basos: 1 %
EOS (ABSOLUTE): 0.1 10*3/uL (ref 0.0–0.4)
Eos: 2 %
Hematocrit: 41.9 % (ref 34.0–46.6)
Hemoglobin: 13.7 g/dL (ref 11.1–15.9)
Immature Grans (Abs): 0 10*3/uL (ref 0.0–0.1)
Immature Granulocytes: 0 %
Lymphocytes Absolute: 1.6 10*3/uL (ref 0.7–3.1)
Lymphs: 28 %
MCH: 30.9 pg (ref 26.6–33.0)
MCHC: 32.7 g/dL (ref 31.5–35.7)
MCV: 95 fL (ref 79–97)
Monocytes Absolute: 0.7 10*3/uL (ref 0.1–0.9)
Monocytes: 11 %
Neutrophils Absolute: 3.4 10*3/uL (ref 1.4–7.0)
Neutrophils: 58 %
Platelets: 198 10*3/uL (ref 150–450)
RBC: 4.43 x10E6/uL (ref 3.77–5.28)
RDW: 12.9 % (ref 11.7–15.4)
WBC: 5.9 10*3/uL (ref 3.4–10.8)

## 2023-08-23 LAB — COMPREHENSIVE METABOLIC PANEL WITH GFR
ALT: 5 IU/L (ref 0–32)
AST: 22 IU/L (ref 0–40)
Albumin: 4.5 g/dL (ref 3.7–4.7)
Alkaline Phosphatase: 104 IU/L (ref 44–121)
BUN/Creatinine Ratio: 22 (ref 12–28)
BUN: 19 mg/dL (ref 8–27)
Bilirubin Total: 0.6 mg/dL (ref 0.0–1.2)
CO2: 21 mmol/L (ref 20–29)
Calcium: 10.7 mg/dL — ABNORMAL HIGH (ref 8.7–10.3)
Chloride: 104 mmol/L (ref 96–106)
Creatinine, Ser: 0.85 mg/dL (ref 0.57–1.00)
Globulin, Total: 2.3 g/dL (ref 1.5–4.5)
Glucose: 110 mg/dL — ABNORMAL HIGH (ref 70–99)
Potassium: 4.4 mmol/L (ref 3.5–5.2)
Sodium: 140 mmol/L (ref 134–144)
Total Protein: 6.8 g/dL (ref 6.0–8.5)
eGFR: 66 mL/min/{1.73_m2} (ref >59)

## 2023-08-23 LAB — TSH: TSH: 4.15 u[IU]/mL (ref 0.450–4.500)

## 2023-08-23 LAB — LIPID PANEL
Chol/HDL Ratio: 3 ratio (ref 0.0–4.4)
Cholesterol, Total: 192 mg/dL (ref 100–199)
HDL: 64 mg/dL (ref >39)
LDL Chol Calc (NIH): 101 mg/dL — ABNORMAL HIGH (ref 0–99)
Triglycerides: 156 mg/dL — ABNORMAL HIGH (ref 0–149)
VLDL Cholesterol Cal: 27 mg/dL (ref 5–40)

## 2023-08-23 LAB — HEMOGLOBIN A1C
Est. average glucose Bld gHb Est-mCnc: 137 mg/dL
Hgb A1c MFr Bld: 6.4 % — ABNORMAL HIGH (ref 4.8–5.6)

## 2023-09-09 ENCOUNTER — Observation Stay (HOSPITAL_BASED_OUTPATIENT_CLINIC_OR_DEPARTMENT_OTHER)
Admission: EM | Admit: 2023-09-09 | Discharge: 2023-09-11 | Disposition: A | Discharge status: Home / Self Care | Attending: Emergency Medicine | Admitting: Emergency Medicine

## 2023-09-09 ENCOUNTER — Emergency Department (HOSPITAL_BASED_OUTPATIENT_CLINIC_OR_DEPARTMENT_OTHER)

## 2023-09-09 ENCOUNTER — Encounter (HOSPITAL_BASED_OUTPATIENT_CLINIC_OR_DEPARTMENT_OTHER): Payer: Self-pay

## 2023-09-09 ENCOUNTER — Other Ambulatory Visit: Payer: Self-pay

## 2023-09-09 DIAGNOSIS — K219 Gastro-esophageal reflux disease without esophagitis: Secondary | ICD-10-CM | POA: Diagnosis not present

## 2023-09-09 DIAGNOSIS — F0390 Unspecified dementia without behavioral disturbance: Secondary | ICD-10-CM | POA: Insufficient documentation

## 2023-09-09 DIAGNOSIS — R0989 Other specified symptoms and signs involving the circulatory and respiratory systems: Secondary | ICD-10-CM | POA: Insufficient documentation

## 2023-09-09 DIAGNOSIS — Z853 Personal history of malignant neoplasm of breast: Secondary | ICD-10-CM | POA: Diagnosis not present

## 2023-09-09 DIAGNOSIS — G20C Parkinsonism, unspecified: Secondary | ICD-10-CM | POA: Diagnosis not present

## 2023-09-09 DIAGNOSIS — R5381 Other malaise: Secondary | ICD-10-CM | POA: Insufficient documentation

## 2023-09-09 DIAGNOSIS — R030 Elevated blood-pressure reading, without diagnosis of hypertension: Secondary | ICD-10-CM | POA: Diagnosis not present

## 2023-09-09 DIAGNOSIS — R55 Syncope and collapse: Secondary | ICD-10-CM | POA: Diagnosis not present

## 2023-09-09 LAB — CBC WITH DIFFERENTIAL/PLATELET
Abs Immature Granulocytes: 0.02 K/uL (ref 0.00–0.07)
Basophils Absolute: 0.1 K/uL (ref 0.0–0.1)
Basophils Relative: 1 %
Eosinophils Absolute: 0.1 K/uL (ref 0.0–0.5)
Eosinophils Relative: 2 %
HCT: 41.3 % (ref 36.0–46.0)
Hemoglobin: 13.5 g/dL (ref 12.0–15.0)
Immature Granulocytes: 0 %
Lymphocytes Relative: 27 %
Lymphs Abs: 1.7 K/uL (ref 0.7–4.0)
MCH: 31 pg (ref 26.0–34.0)
MCHC: 32.7 g/dL (ref 30.0–36.0)
MCV: 94.7 fL (ref 80.0–100.0)
Monocytes Absolute: 0.9 K/uL (ref 0.1–1.0)
Monocytes Relative: 14 %
Neutro Abs: 3.6 K/uL (ref 1.7–7.7)
Neutrophils Relative %: 56 %
Platelets: 193 K/uL (ref 150–400)
RBC: 4.36 MIL/uL (ref 3.87–5.11)
RDW: 13.3 % (ref 11.5–15.5)
WBC: 6.4 K/uL (ref 4.0–10.5)
nRBC: 0 % (ref 0.0–0.2)

## 2023-09-09 LAB — URINALYSIS, ROUTINE W REFLEX MICROSCOPIC
Bilirubin Urine: NEGATIVE
Glucose, UA: NEGATIVE mg/dL
Hgb urine dipstick: NEGATIVE
Ketones, ur: NEGATIVE mg/dL
Leukocytes,Ua: NEGATIVE
Nitrite: NEGATIVE
Protein, ur: NEGATIVE mg/dL
Specific Gravity, Urine: 1.018 (ref 1.005–1.030)
pH: 7 (ref 5.0–8.0)

## 2023-09-09 LAB — COMPREHENSIVE METABOLIC PANEL WITH GFR
ALT: <5 U/L (ref 0–44)
AST: 23 U/L (ref 15–41)
Albumin: 4.5 g/dL (ref 3.5–5.0)
Alkaline Phosphatase: 98 U/L (ref 38–126)
Anion gap: 12 (ref 5–15)
BUN: 23 mg/dL (ref 8–23)
CO2: 23 mmol/L (ref 22–32)
Calcium: 11.4 mg/dL — ABNORMAL HIGH (ref 8.9–10.3)
Chloride: 104 mmol/L (ref 98–111)
Creatinine, Ser: 0.87 mg/dL (ref 0.44–1.00)
GFR, Estimated: >60 mL/min
Glucose, Bld: 124 mg/dL — ABNORMAL HIGH (ref 70–99)
Potassium: 4.3 mmol/L (ref 3.5–5.1)
Sodium: 138 mmol/L (ref 135–145)
Total Bilirubin: 0.5 mg/dL (ref 0.0–1.2)
Total Protein: 7.3 g/dL (ref 6.5–8.1)

## 2023-09-09 LAB — TROPONIN T, HIGH SENSITIVITY
Troponin T High Sensitivity: <15 ng/L (ref <19)
Troponin T High Sensitivity: <15 ng/L (ref <19)

## 2023-09-09 LAB — CBG MONITORING, ED: Glucose-Capillary: 129 mg/dL — ABNORMAL HIGH (ref 70–99)

## 2023-09-09 LAB — LIPASE, BLOOD: Lipase: 38 U/L (ref 11–51)

## 2023-09-09 MED ORDER — CARBIDOPA-LEVODOPA 25-100 MG PO TABS
2.0000 | ORAL_TABLET | ORAL | Status: DC
  Administered 2023-09-10 – 2023-09-11 (×5): 2 via ORAL
  Filled 2023-09-09 (×5): qty 2

## 2023-09-09 MED ORDER — ROSUVASTATIN CALCIUM 5 MG PO TABS
5.0000 mg | ORAL_TABLET | Freq: Every day | ORAL | Status: DC
  Administered 2023-09-10: 5 mg via ORAL
  Filled 2023-09-09 (×2): qty 1

## 2023-09-09 MED ORDER — PANTOPRAZOLE SODIUM 40 MG PO TBEC
40.0000 mg | DELAYED_RELEASE_TABLET | Freq: Every day | ORAL | Status: DC
  Administered 2023-09-09 – 2023-09-11 (×3): 40 mg via ORAL
  Filled 2023-09-09 (×3): qty 1

## 2023-09-09 MED ORDER — MELATONIN 3 MG PO TABS: 6.0000 mg | ORAL_TABLET | Freq: Every evening | ORAL | Status: DC | PRN

## 2023-09-09 MED ORDER — SODIUM CHLORIDE 0.9% FLUSH
3.0000 mL | Freq: Two times a day (BID) | INTRAVENOUS | Status: DC
  Administered 2023-09-10 – 2023-09-11 (×3): 3 mL via INTRAVENOUS

## 2023-09-09 MED ORDER — MIRTAZAPINE 15 MG PO TABS
15.0000 mg | ORAL_TABLET | Freq: Every day | ORAL | Status: DC
  Administered 2023-09-10: 15 mg via ORAL
  Filled 2023-09-09 (×2): qty 1

## 2023-09-09 MED ORDER — POLYETHYLENE GLYCOL 3350 17 G PO PACK: 17.0000 g | PACK | Freq: Every day | ORAL | Status: DC | PRN

## 2023-09-09 MED ORDER — CARBIDOPA-LEVODOPA ER 50-200 MG PO TBCR
1.0000 | EXTENDED_RELEASE_TABLET | Freq: Every day | ORAL | Status: DC
  Administered 2023-09-10: 1 via ORAL
  Filled 2023-09-09 (×3): qty 1

## 2023-09-09 MED ORDER — ONDANSETRON HCL 4 MG/2ML IJ SOLN: 4.0000 mg | Freq: Four times a day (QID) | INTRAMUSCULAR | Status: DC | PRN

## 2023-09-09 MED ORDER — ENOXAPARIN SODIUM 40 MG/0.4ML IJ SOSY
40.0000 mg | PREFILLED_SYRINGE | INTRAMUSCULAR | Status: DC
  Administered 2023-09-09 – 2023-09-10 (×2): 40 mg via SUBCUTANEOUS
  Filled 2023-09-09 (×2): qty 0.4

## 2023-09-09 MED ORDER — CARBIDOPA-LEVODOPA 25-100 MG PO TABS: 2.0000 | ORAL_TABLET | Freq: Three times a day (TID) | ORAL | Status: DC

## 2023-09-09 MED ORDER — SODIUM CHLORIDE 0.9 % IV BOLUS: 1000.0000 mL | Freq: Once | INTRAVENOUS | Status: DC

## 2023-09-09 MED ORDER — ACETAMINOPHEN 500 MG PO TABS: 1000.0000 mg | ORAL_TABLET | Freq: Four times a day (QID) | ORAL | Status: DC | PRN

## 2023-09-09 MED ORDER — ALBUTEROL SULFATE (2.5 MG/3ML) 0.083% IN NEBU: 2.5000 mg | INHALATION_SOLUTION | RESPIRATORY_TRACT | Status: DC | PRN

## 2023-09-09 MED ORDER — DONEPEZIL HCL 10 MG PO TABS
10.0000 mg | ORAL_TABLET | Freq: Every day | ORAL | Status: DC
  Administered 2023-09-09 – 2023-09-10 (×2): 10 mg via ORAL
  Filled 2023-09-09 (×2): qty 1

## 2023-09-09 MED ORDER — LETROZOLE 2.5 MG PO TABS
2.5000 mg | ORAL_TABLET | Freq: Every day | ORAL | Status: DC
  Administered 2023-09-10 – 2023-09-11 (×2): 2.5 mg via ORAL
  Filled 2023-09-09 (×2): qty 1

## 2023-09-09 NOTE — H&P (Signed)
 History and Physical    Jamie [name] FMW:[id number] DOB: [birth year] DOA: 09/09/2023  PCP: Jamie Saddie [name], PA-C   Patient coming from: Home   Chief Complaint:  Chief Complaint  Patient presents with   Loss of Consciousness    HPI:  Jamie [name] is a 89 y.o. female with hx of Parkinsons disease with mixed parkinsons and alzheimers dementia, labile blood pressure, breast CA stage 1B s/p mastectomy, XRT and remains on hormonal therapy, who is transferred from [facility] ED for syncopal episode. Was eating breakfast with caregiver and had a syncopal episode where she went face down into her pancakes. Apparrently had some shaking in her hands at the time, patient herself attributes to parkinsons. But other than this no seizure like activity noted. No incontinence. When she came too not confused. Denies any preceeding illness, fever, chills, cough / cold, gi losses, dysuria. She has no dysphagia or issues with PO intake. No preceeding chest pain, palpitations, vision tunneling. Does not recall coughing on food or any other immediate stimulus. Confirms seated ~ 10 min without transition when episode occurred. No prior episodes of syncope. No recent change in medications     Review of Systems:  ROS complete and negative except as marked above   Allergies  Allergen Reactions   Flagyl  [Metronidazole ] Rash    Possible cause of rash     Prior to Admission medications   Medication Sig Start Date End Date Taking? Authorizing Provider  Biotin 1 MG CAPS     [provider]  carbidopa -levodopa  (SINEMET  CR) 50-200 MG tablet TAKE 1 TABLET BY MOUTH AT BEDTIME. 05/28/23   Tat, Asberry RAMAN, DO  carbidopa -levodopa  (SINEMET  IR) 25-100 MG tablet TAKE 2 TABLETS BY MOUTH 3 TIMES DAILY. 07/17/23   Tat, Asberry RAMAN, DO  cholecalciferol (VITAMIN D3) 25 MCG (1000 UNIT) tablet Take 2,000 Units by mouth daily.    [provider]  cyanocobalamin (VITAMIN B12) 500 MCG tablet     [provider]   donepezil  (ARICEPT ) 10 MG tablet TAKE 1 TABLET (10 MG TOTAL) BY MOUTH AT BEDTIME. 07/17/23   Tat, Asberry RAMAN, DO  letrozole  (FEMARA ) 2.5 MG tablet TAKE 1 TABLET (2.5 MG TOTAL) BY MOUTH DAILY. 11/15/22   Gudena, Vinay, MD  mirtazapine  (REMERON ) 15 MG tablet TAKE 1 TABLET (15 MG TOTAL) BY MOUTH AT BEDTIME. 05/09/23   Chandra Toribio POUR, MD  Multiple Vitamins-Minerals (PRESERVISION AREDS 2 PO) Take 1 tablet by mouth in the morning and at bedtime.     [provider]  NON FORMULARY Herblax as needed    [provider]  NON FORMULARY Cholesterol Reduction    [provider]  omeprazole  (PRILOSEC) 20 MG capsule TAKE 1 CAPSULE BY MOUTH DAILY AT 5:30 PM 05/09/23   Chandra Toribio POUR, MD  rosuvastatin  (CRESTOR ) 5 MG tablet Take 1 tablet (5 mg total) by mouth at bedtime. 11/09/22   Wallace Joesph LABOR, PA  Vitamin E 100 units TABS Take 250 mg by mouth. Once a day    [provider]  donepezil  (ARICEPT ) 5 MG tablet Take 1 tablet (5 mg total) by mouth at bedtime. 05/04/22   Hanford Powell BRAVO, NP    Past Medical History:  Diagnosis Date   Abnormality of gait 04/18/2017   Breast cancer (HCC)    Left breast mastectomy.   Degenerative joint disease of knee, right 03/19/2018   Injected April 18, 2018 Approved for Durolane R knee 3.4.20. Patient given another steroid injection Jun 24, 2018.  Repeat injection September 09, 2018 repeat injection February 27, 2019 repeat 6/110/20221 bilateral injections given December 01, 2019 Right knee injected April 20, 2020 monovisc May 19, 2018 Repeat steroid injection given June 16, 2020 repeat injection August 24, 2020   Essential hypertension 08/14/2016   Family history of breast cancer 07/14/2021   Generalized anxiety disorder 06/27/2017   History of dizziness 06/27/2017   History of hysterectomy for benign disease 06/27/2017   History of vitamin D  deficiency 08/22/2017   Leukocytes in urine 08/14/2016   Lumbar facet arthropathy 06/27/2017   Major  neurocognitive disorder due to multiple etiologies 09/29/2020   Mixed hyperlipidemia 08/22/2017   Osteoarthritis of knee 09/18/2017   Pancolitis 08/14/2016   Parkinson's disease (HCC) 03/13/2017   Personal history of chemotherapy    Prediabetes 08/22/2017   Right knee pain    Spinal stenosis of lumbar region    Weakness of both hips 04/18/2017   White coat syndrome without diagnosis of hypertension 08/22/2017    Past Surgical History:  Procedure Laterality Date   BREAST BIOPSY Left    06/2021   HYSTERECTOMY ABDOMINAL WITH SALPINGECTOMY     MASTECTOMY W/ SENTINEL NODE BIOPSY Left 08/18/2021   Procedure: LEFT MASTECTOMY WITH TARGETED LYMPH NODE DISSECTION;  Surgeon: Vernetta Berg, MD;  Location: [facility];  Service: General;  Laterality: Left;   TONSILLECTOMY     TOTAL ABDOMINAL HYSTERECTOMY W/ BILATERAL SALPINGOOPHORECTOMY       reports that she has never smoked. She has never been exposed to tobacco smoke. She has never used smokeless tobacco. She reports that she does not drink alcohol and does not use drugs.  Family History  Problem Relation Age of Onset   Breast cancer Mother 45   Melanoma Father        dx. 72s   Lung cancer Father        dx. 8s   Healthy Son      Physical Exam: Vitals:   09/09/23 1500 09/09/23 1520 09/09/23 1600 09/09/23 1858  BP: (!) 190/111  (!) 196/85 110/67  Pulse: 66  68 72  Resp: 14  11 16   Temp:  98.6 F (37 C)  97.9 F (36.6 C)  TempSrc:  Oral    SpO2: 98%  99% 97%     Gen: Awake, alert, chronically ill appearing.   CV: Regular, normal S1, S2, 1/6 SEM  Resp: Normal WOB, CTAB  Abd: Flat, normoactive, nontender MSK: Symmetric, no edema  Skin: No rashes or lesions to exposed skin  Neuro: Alert and interactive, bradykinesia  Psych: euthymic, appropriate    Data review:   Labs reviewed, notable for:   Ca 11.4  UA rare bacteria, no pyuria, + hyaline case    Micro:  Results for orders placed or performed  during the hospital encounter of 08/14/16  C difficile quick scan w PCR reflex     Status: None   Collection Time: 08/14/16  5:57 PM   Specimen: STOOL  Result Value Ref Range Status   C Diff antigen NEGATIVE NEGATIVE Final   C Diff toxin NEGATIVE NEGATIVE Final   C Diff interpretation No C. difficile detected.  Final  Gastrointestinal Panel by PCR , Stool     Status: None   Collection Time: 08/14/16  5:57 PM   Specimen: STOOL  Result Value Ref Range Status   Campylobacter species NOT DETECTED NOT DETECTED Final   Plesimonas shigelloides NOT DETECTED NOT DETECTED Final   Salmonella species NOT  DETECTED NOT DETECTED Final   Yersinia enterocolitica NOT DETECTED NOT DETECTED Final   Vibrio species NOT DETECTED NOT DETECTED Final   Vibrio cholerae NOT DETECTED NOT DETECTED Final   Enteroaggregative E coli (EAEC) NOT DETECTED NOT DETECTED Final   Enteropathogenic E coli (EPEC) NOT DETECTED NOT DETECTED Final   Enterotoxigenic E coli (ETEC) NOT DETECTED NOT DETECTED Final   Shiga like toxin producing E coli (STEC) NOT DETECTED NOT DETECTED Final   Shigella/Enteroinvasive E coli (EIEC) NOT DETECTED NOT DETECTED Final   Cryptosporidium NOT DETECTED NOT DETECTED Final   Cyclospora cayetanensis NOT DETECTED NOT DETECTED Final   Entamoeba histolytica NOT DETECTED NOT DETECTED Final   Giardia lamblia NOT DETECTED NOT DETECTED Final   Adenovirus F40/41 NOT DETECTED NOT DETECTED Final   Astrovirus NOT DETECTED NOT DETECTED Final   Norovirus GI/GII NOT DETECTED NOT DETECTED Final   Rotavirus A NOT DETECTED NOT DETECTED Final   Sapovirus (I, II, IV, and V) NOT DETECTED NOT DETECTED Final  Culture, blood (Routine X 2) w Reflex to ID Panel     Status: None   Collection Time: 08/14/16  6:18 PM   Specimen: BLOOD  Result Value Ref Range Status   Specimen Description BLOOD RIGHT WRIST  Final   Special Requests IN PEDIATRIC BOTTLE Blood Culture adequate volume  Final   Culture NO GROWTH 5 DAYS  Final    Report Status 08/19/2016 FINAL  Final  Culture, blood (Routine X 2) w Reflex to ID Panel     Status: None   Collection Time: 08/14/16  6:25 PM   Specimen: BLOOD  Result Value Ref Range Status   Specimen Description BLOOD LEFT ARM  Final   Special Requests IN PEDIATRIC BOTTLE Blood Culture adequate volume  Final   Culture NO GROWTH 5 DAYS  Final   Report Status 08/19/2016 FINAL  Final  Urine culture     Status: Abnormal   Collection Time: 08/14/16  9:49 PM   Specimen: Urine, Clean Catch  Result Value Ref Range Status   Specimen Description URINE, CLEAN CATCH  Final   Special Requests NONE  Final   Culture <10,000 COLONIES/mL INSIGNIFICANT GROWTH (A)  Final   Report Status 08/16/2016 FINAL  Final    Imaging reviewed:  CT Head Wo Contrast Result Date: 09/09/2023 CLINICAL DATA:  Syncope/presyncope. Evaluate for cerebrovascular calls. EXAM: CT HEAD WITHOUT CONTRAST TECHNIQUE: Contiguous axial images were obtained from the base of the skull through the vertex without intravenous contrast. RADIATION DOSE REDUCTION: This exam was performed according to the departmental dose-optimization program which includes automated exposure control, adjustment of the mA and/or kV according to patient size and/or use of iterative reconstruction technique. COMPARISON:  Brain MRI from 07/31/2016 FINDINGS: Brain: No evidence of acute infarction, hemorrhage, hydrocephalus, extra-axial collection or mass lesion/mass effect. There is mild diffuse low-attenuation within the subcortical and periventricular white matter compatible with chronic microvascular disease. More focal confluent area of low attenuation is noted within the subcortical white matter of the left posterior parietal lobe, axial image 20. Prominence of sulci and ventricles compatible with brain atrophy. Vascular: No hyperdense vessel or unexpected calcification. Skull: Normal. Negative for fracture or focal lesion. Sinuses/Orbits: No acute finding. Other:  None. IMPRESSION: 1. No acute intracranial abnormalities. 2. Chronic microvascular disease and brain atrophy. 3. More focal confluent area of low attenuation is noted within the subcortical white matter of the left posterior parietal lobe. This is favored to represent a chronic infarct. Electronically Signed  By: Waddell Calk M.D.   On: 09/09/2023 12:40   DG Chest Portable 1 View Result Date: 09/09/2023 CLINICAL DATA:  Syncope EXAM: PORTABLE CHEST 1 VIEW COMPARISON:  None Available. FINDINGS: The heart size and mediastinal contours are within normal limits. Both lungs are clear. The visualized skeletal structures are unremarkable. IMPRESSION: No active disease. Electronically Signed   By: Dorethia Molt M.D.   On: 09/09/2023 11:58    EKG:  Personally reviewed,  SR with LAD, LVH, no acute ischemic changes   ED Course:  Transferred for further eval of syncope     Assessment/Plan:  89 y.o. female with hx  Parkinsons disease with mixed parkinsons and alzheimers dementia, labile blood pressure, breast CA stage 1B s/p mastectomy, XRT and remains on hormonal therapy, who is transferred from [facility] ED for syncopal episode.  Syncope, suspect hypovolemic / labile pressure contributing  Hx syncopal episode while seated, shaking in hands at time suspect either related to PD or convulsive syncope rather than seizure. BP in ED labile 110- 190 systolic without treatment. Some lab markers of dehydration including mild hyperCa and UA with hyaline case. EKG is LVH, no acute ischemic changes. She has TTE in 3/'24 with LVEF 55-60%, G1DD, and no significant valvular disease.  - Give 1 L IVF  - Serial orthostatics  - Tele monitoring  - Do not feel requires repeat Echo  - Hold off on EEG, less likelky seizure   Mild hyperCa  Hx intermittent hyperCa on review prior labs, currently 11.4.  - Check PTH, f/u outpatient   Chronic medical problems:  Parkinsons disease: Continue home Sinemet   PD / AD dementia:  Continue home Donepezil , monitor for any bradycardia  Labile BP: Noted  Breast CA Stage 1B s/p mastectomy / XRT: continue home Letrozole   ? Sleep / mood: continue home Mirtazapine   GERD: sub home PPI   There is no height or weight on file to calculate BMI.    DVT prophylaxis:  Lovenox  Code Status:  Full Code Diet:  Diet Orders (From admission, onward)    None      Family Communication:  Yes discussed with sons and DIL at bedside   Consults:  None   Admission status:   Observation, Telemetry bed  Severity of Illness: The appropriate patient status for this patient is OBSERVATION. Observation status is judged to be reasonable and necessary in order to provide the required intensity of service to ensure the patient's safety. The patient's presenting symptoms, physical exam findings, and initial radiographic and laboratory data in the context of their medical condition is felt to place them at decreased risk for further clinical deterioration. Furthermore, it is anticipated that the patient will be medically stable for discharge from the hospital within 2 midnights of admission.    Dorn Dawson, MD [facility]  How to contact the [facility] Attending or Consulting provider 7A - 7P or covering provider during after hours 7P -7A, for this patient.  Check the care team in [facility] and look for a) attending/consulting [facility] provider listed and b) the [facility] team listed Log into [email or web address] and use [facility] universal password to access. If you do not have the password, please contact the hospital operator. Locate the [facility] provider you are looking for under [facility] and page to a number that you can be directly reached. If you still have difficulty reaching the provider, please page the [facility] (Director on Call) for the Hospitalists listed on amion for assistance.  09/09/2023, 7:59 PM

## 2023-09-09 NOTE — ED Notes (Signed)
 Called [facility] to transport patient to [facility] rm# [id number]

## 2023-09-09 NOTE — ED Provider Notes (Signed)
 [place] EMERGENCY DEPARTMENT AT [facility] Provider Note   CSN: [id number] Arrival date & time: 09/09/23  1059     Patient presents with: Loss of Consciousness   Jamie [name] is a 89 y.o. female.   89 year old female with a past medical history of working since presents to the ED status post syncopal episode this morning.  According to son at the bedside, patient was having breakfast sitting with her caregiver, once she had been sitting for some time eating her pancakes and face planted onto the plate of food.  He reports patient was out for a few seconds, she regained consciousness and woke up to EMS.  They ran an EKG, recommend that she be evaluated in the emergency department.  She reports that has not been any illness recently, everything has been normal at home.  She is not having any headache, no chest pain, no shortness of breath, no abdominal pain, no nausea or vomiting.  The history is provided by the patient.  Loss of Consciousness Episode history:  Single Most recent episode:  Today Duration:  10 seconds Timing:  Rare Chronicity:  New Witnessed: yes   Associated symptoms: no chest pain, no fever, no nausea, no shortness of breath and no vomiting        Prior to Admission medications   Medication Sig Start Date End Date Taking? Authorizing Provider  Biotin 1 MG CAPS     [provider]  carbidopa -levodopa  (SINEMET  CR) 50-200 MG tablet TAKE 1 TABLET BY MOUTH AT BEDTIME. 05/28/23   Tat, Asberry RAMAN, DO  carbidopa -levodopa  (SINEMET  IR) 25-100 MG tablet TAKE 2 TABLETS BY MOUTH 3 TIMES DAILY. 07/17/23   Tat, Asberry RAMAN, DO  cholecalciferol (VITAMIN D3) 25 MCG (1000 UNIT) tablet Take 2,000 Units by mouth daily.    [provider]  cyanocobalamin (VITAMIN B12) 500 MCG tablet     [provider]  donepezil  (ARICEPT ) 10 MG tablet TAKE 1 TABLET (10 MG TOTAL) BY MOUTH AT BEDTIME. 07/17/23   Tat, Asberry RAMAN, DO  letrozole  (FEMARA ) 2.5 MG tablet  TAKE 1 TABLET (2.5 MG TOTAL) BY MOUTH DAILY. 11/15/22   Gudena, Vinay, MD  mirtazapine  (REMERON ) 15 MG tablet TAKE 1 TABLET (15 MG TOTAL) BY MOUTH AT BEDTIME. 05/09/23   Chandra Toribio POUR, MD  Multiple Vitamins-Minerals (PRESERVISION AREDS 2 PO) Take 1 tablet by mouth in the morning and at bedtime.     [provider]  NON FORMULARY Herblax as needed    [provider]  NON FORMULARY Cholesterol Reduction    [provider]  omeprazole  (PRILOSEC) 20 MG capsule TAKE 1 CAPSULE BY MOUTH DAILY AT 5:30 PM 05/09/23   Chandra Toribio POUR, MD  rosuvastatin  (CRESTOR ) 5 MG tablet Take 1 tablet (5 mg total) by mouth at bedtime. 11/09/22   Wallace Joesph LABOR, PA  Vitamin E 100 units TABS Take 250 mg by mouth. Once a day    [provider]  donepezil  (ARICEPT ) 5 MG tablet Take 1 tablet (5 mg total) by mouth at bedtime. 05/04/22   Hanford Powell BRAVO, NP    Allergies: Flagyl  [metronidazole ]    Review of Systems  Constitutional:  Negative for chills and fever.  Respiratory:  Negative for shortness of breath.   Cardiovascular:  Positive for syncope. Negative for chest pain.  Gastrointestinal:  Negative for abdominal pain, nausea and vomiting.  Genitourinary:  Negative for flank pain.  Musculoskeletal:  Negative for back pain.  All other systems reviewed  and are negative.   Updated Vital Signs BP (!) 169/93   Pulse 80   Temp 98.1 F (36.7 C) (Oral)   Resp 15   SpO2 96%   Physical Exam Vitals and nursing note reviewed.  Constitutional:      Appearance: Normal appearance.  HENT:     Head: Normocephalic and atraumatic.     Comments: No signs of injury or deformity.     Mouth/Throat:     Mouth: Mucous membranes are moist.     Comments: No signs of blood in the oropharynx.  Cardiovascular:     Rate and Rhythm: Normal rate.  Pulmonary:     Effort: Pulmonary effort is normal.     Comments: Clear to auscultation.  Abdominal:     General: Abdomen is flat.     Palpations:  Abdomen is soft.     Tenderness: There is no abdominal tenderness.     Comments: No tenderness to palpation.   Musculoskeletal:     Cervical back: Normal range of motion and neck supple.  Skin:    General: Skin is warm and dry.  Neurological:     Mental Status: She is alert and oriented to person, place, and time.     Comments: Alert, oriented, thought content appropriate. Speech fluent without evidence of aphasia. Able to follow 2 step commands without difficulty.  Cranial Nerves:  II:  Peripheral visual fields grossly normal, pupils, round, reactive to light III,IV, VI: ptosis not present, extra-ocular motions intact bilaterally  V,VII: smile symmetric, facial light touch sensation equal VIII: hearing grossly normal bilaterally  IX,X: midline uvula rise  XI: bilateral shoulder shrug equal and strong XII: midline tongue extension  Motor:  5/5 in upper and lower extremities bilaterally including strong and equal grip strength and dorsiflexion/plantar flexion Sensory: light touch normal in all extremities.  Cerebellar: normal finger-to-nose with bilateral upper extremities, pronator drift negative Gait: N/A       (all labs ordered are listed, but only abnormal results are displayed) Labs Reviewed  COMPREHENSIVE METABOLIC PANEL WITH GFR - Abnormal; Notable for the following components:      Result Value   Glucose, Bld 124 (*)    Calcium  11.4 (*)    All other components within normal limits  URINALYSIS, ROUTINE W REFLEX MICROSCOPIC - Abnormal; Notable for the following components:   Bacteria, UA RARE (*)    All other components within normal limits  CBG MONITORING, ED - Abnormal; Notable for the following components:   Glucose-Capillary 129 (*)    All other components within normal limits  CBC WITH DIFFERENTIAL/PLATELET  LIPASE, BLOOD  TROPONIN T, HIGH SENSITIVITY  TROPONIN T, HIGH SENSITIVITY    EKG: EKG Interpretation Date/Time:  Sunday September 09 2023 11:11:03  EDT Ventricular Rate:  83 PR Interval:  166 QRS Duration:  97 QT Interval:  369 QTC Calculation: 434 R Axis:   -31  Text Interpretation: Sinus rhythm Left ventricular hypertrophy Confirmed by Patsey Lot [phone]) on 09/09/2023 11:26:12 AM  Radiology: CT Head Wo Contrast Result Date: 09/09/2023 CLINICAL DATA:  Syncope/presyncope. Evaluate for cerebrovascular calls. EXAM: CT HEAD WITHOUT CONTRAST TECHNIQUE: Contiguous axial images were obtained from the base of the skull through the vertex without intravenous contrast. RADIATION DOSE REDUCTION: This exam was performed according to the departmental dose-optimization program which includes automated exposure control, adjustment of the mA and/or kV according to patient size and/or use of iterative reconstruction technique. COMPARISON:  Brain MRI from 07/31/2016 FINDINGS: Brain: No evidence of  acute infarction, hemorrhage, hydrocephalus, extra-axial collection or mass lesion/mass effect. There is mild diffuse low-attenuation within the subcortical and periventricular white matter compatible with chronic microvascular disease. More focal confluent area of low attenuation is noted within the subcortical white matter of the left posterior parietal lobe, axial image 20. Prominence of sulci and ventricles compatible with brain atrophy. Vascular: No hyperdense vessel or unexpected calcification. Skull: Normal. Negative for fracture or focal lesion. Sinuses/Orbits: No acute finding. Other: None. IMPRESSION: 1. No acute intracranial abnormalities. 2. Chronic microvascular disease and brain atrophy. 3. More focal confluent area of low attenuation is noted within the subcortical white matter of the left posterior parietal lobe. This is favored to represent a chronic infarct. Electronically Signed   By: Waddell Calk M.D.   On: 09/09/2023 12:40   DG Chest Portable 1 View Result Date: 09/09/2023 CLINICAL DATA:  Syncope EXAM: PORTABLE CHEST 1 VIEW COMPARISON:  None  Available. FINDINGS: The heart size and mediastinal contours are within normal limits. Both lungs are clear. The visualized skeletal structures are unremarkable. IMPRESSION: No active disease. Electronically Signed   By: Dorethia Molt M.D.   On: 09/09/2023 11:58     Procedures   Medications Ordered in the ED - No data to display                                  Medical Decision Making Amount and/or Complexity of Data Reviewed Labs: ordered. Radiology: ordered.  Risk Decision regarding hospitalization.   This patient presents to the ED for concern of syncope, this involves a number of treatment options, and is a complaint that carries with it a high risk of complications and morbidity.  The differential diagnosis includes ACS, PE, vasovagal.   Co morbidities: Discussed in HPI  Brief History:  SEE HPI.   EMR reviewed including pt PMHx, past surgical history and past visits to ER.   See HPI for more details   Lab Tests:  I ordered and independently interpreted labs.  The pertinent results include:    I personally reviewed all laboratory work and imaging. Metabolic panel without any acute abnormality specifically kidney function within normal limits and no significant electrolyte abnormalities. CBC without leukocytosis or significant anemia. Lipase levels normal.  UA with no nitrate, no leukocytes present.  Troponin x2 have remained flat.  Imaging Studies:  Chest x-ray without any acute findings as pneumonia, pleural effusion. CT head without any intracranial pathology.  Cardiac Monitoring:  The patient was maintained on a cardiac monitor.  I personally viewed and interpreted the cardiac monitored which showed an underlying rhythm of: NSR 83 EKG non-ischemic   Medicines ordered:  N/A  Reevaluation:  After the interventions noted above I re-evaluated patient and found that they have :stayed the same  Social Determinants of Health:  The patient's social  determinants of health were a factor in the care of this patient  Problem List / ED Course:  Patient presented to the ED status post syncopal episode which occurred while she was having breakfast this morning.  She was out for a few seconds according to her aide, she lost conscious, was able to wake up when EMS arrived.  EKG was normal sinus rhythm according tachycardia-bradycardia noted.  She did strike her head on the table.  CT head without any intracranial hemorrhage noted.  According to the aide who takes care of her, patient had some shaking of her hands ,  prior to syncopized in.  She does have an underlying history of Parkinson's, is on multiple medication for this.  She did not have any prodromal sign prior to syncope.  She denies any chest pain, shortness of breath, headache, abdominal pain.  She does tell me I just feel very rundown and tired. Some concern for seizures, although she does not have a history of this, no prior medications given for this.  According to the aide this was not total body shaking, more so her Parkinson shaking. [facility] rule, this patient is not low risk for any cardiac event.  I do feel that she meets criteria for admission at this time.  I discussed the results with son and patient who is agreeable on staying at this time. Upon extensive review of records, I do see that her echo was done in 2024, she does have an EF of 55 to 60%. Discussed this case with hospitalist Dr. Vernon. Hemodynamically stable for admission.   Dispostion:  After consideration of the diagnostic results and the patients response to treatment, I feel that the patent would benefit from admission.        Final diagnoses:  Syncope, unspecified syncope type    ED Discharge Orders     None          Marye Eagen, PA-C 09/09/23 1510    Patsey Lot, MD 09/10/23 1249

## 2023-09-09 NOTE — ED Notes (Signed)
 Pt states she is too lightheaded to stand when attempting to obtain orthostatic vitals.

## 2023-09-09 NOTE — ED Triage Notes (Addendum)
 Patient states syncopal episode this morning. States her caregiver told her her she fell into her breakfast plate but does not recall event. Denies injury from event

## 2023-09-10 ENCOUNTER — Encounter: Payer: Self-pay | Admitting: Neurology

## 2023-09-10 ENCOUNTER — Observation Stay (HOSPITAL_BASED_OUTPATIENT_CLINIC_OR_DEPARTMENT_OTHER)

## 2023-09-10 DIAGNOSIS — R55 Syncope and collapse: Secondary | ICD-10-CM

## 2023-09-10 LAB — ECHOCARDIOGRAM COMPLETE
Area-P 1/2: 2.32 cm2
Height: 68 in
S' Lateral: 2.3 cm
Weight: 2998.26 [oz_av]

## 2023-09-10 LAB — BASIC METABOLIC PANEL WITH GFR
Anion gap: 10 (ref 5–15)
BUN: 20 mg/dL (ref 8–23)
CO2: 23 mmol/L (ref 22–32)
Calcium: 10.1 mg/dL (ref 8.9–10.3)
Chloride: 108 mmol/L (ref 98–111)
Creatinine, Ser: 0.71 mg/dL (ref 0.44–1.00)
GFR, Estimated: >60 mL/min (ref >60)
Glucose, Bld: 110 mg/dL — ABNORMAL HIGH (ref 70–99)
Potassium: 3.9 mmol/L (ref 3.5–5.1)
Sodium: 141 mmol/L (ref 135–145)

## 2023-09-10 LAB — PHOSPHORUS: Phosphorus: 3.1 mg/dL (ref 2.5–4.6)

## 2023-09-10 LAB — CBC
HCT: 39.5 % (ref 36.0–46.0)
Hemoglobin: 12.9 g/dL (ref 12.0–15.0)
MCH: 30.8 pg (ref 26.0–34.0)
MCHC: 32.7 g/dL (ref 30.0–36.0)
MCV: 94.3 fL (ref 80.0–100.0)
Platelets: 191 K/uL (ref 150–400)
RBC: 4.19 MIL/uL (ref 3.87–5.11)
RDW: 13.5 % (ref 11.5–15.5)
WBC: 5 K/uL (ref 4.0–10.5)
nRBC: 0 % (ref 0.0–0.2)

## 2023-09-10 LAB — MAGNESIUM: Magnesium: 2 mg/dL (ref 1.7–2.4)

## 2023-09-10 LAB — TSH: TSH: 6.886 u[IU]/mL — ABNORMAL HIGH (ref 0.350–4.500)

## 2023-09-10 NOTE — Progress Notes (Signed)
 Patient admitted for Syncope, suspect hypovolemic / labile pressure contributing.  No IP Care management needs at this time.  Please place [facility] consult if needs arise prior to discharge to home.

## 2023-09-10 NOTE — Telephone Encounter (Signed)
FYI DR. Tat

## 2023-09-10 NOTE — Progress Notes (Signed)
  Progress Note   Patient: Jamie [name] FMW:[id number] DOB: 1934-10-11 DOA: 09/09/2023     0 DOS: the patient was seen and examined on 09/10/2023   Brief hospital course: 89 y.o. female with hx  Parkinsons disease with mixed parkinsons and alzheimers dementia, labile blood pressure, breast CA stage 1B s/p mastectomy, XRT and remains on hormonal therapy, who is transferred from [facility] ED for syncopal episode. She has been evaluated and found to have some low BP but improved.  Her calcium  was 11.4 and resolved back to normal.  Echo is pending.  PT is pending.  Patient still feels weak and not well.    Assessment and Plan:  Syncope, suspect hypovolemic / labile pressure contributing  Hx syncopal episode while seated, shaking in hands at time suspect either related to PD or convulsive syncope rather than seizure. BP in ED labile 110- 190 systolic without treatment. Some lab markers of dehydration including mild hyperCa and UA with hyaline cast. EKG is LVH, no acute ischemic changes. She has TTE in 3/'24 with LVEF 55-60%, G1DD, and no significant valvular disease.  - She received 1 L IVF  - Serial orthostatics with no significant finding - Tele monitoring with no arrhythmias - Pending echo  - No EEG, as it is less likelky seizure    Mild hyperCa  Hx intermittent hyperCa on review prior labs, currently 11.4. - Calcium  normalized after IV fluid - Check PTH, f/u outpatient  Deconditioning:  Will get PT evaluation which is pending   Chronic medical problems:  Parkinsons disease: Continue home Sinemet   PD / AD dementia: Continue home Donepezil , monitor for any bradycardia  Labile BP: Noted  Breast CA Stage 1B s/p mastectomy / XRT: continue home Letrozole   ? Sleep / mood: continue home Mirtazapine   GERD: sub home PPI       Subjective: Feels weak.  Physical Exam: Vitals:   09/09/23 2338 09/10/23 0420 09/10/23 0800 09/10/23 1132  BP: 122/60 (!) 132/53 (!) 133/56 (!) 142/64  Pulse: 66 60  72 73  Resp: 17 17 18 18   Temp: (!) 97.4 F (36.3 C) (!) 97.4 F (36.3 C) (!) 97.5 F (36.4 C) 97.9 F (36.6 C)  TempSrc:      SpO2: 97% 97% 98% 99%  Weight:      Height:       Constitutional: Alert, awake, calm, comfortable HEENT: Neck supple Respiratory: clear to auscultation bilaterally, no wheezing, no crackles. Normal respiratory effort. No accessory muscle use.  Cardiovascular: Regular rate and rhythm, no murmurs / rubs / gallops. No extremity edema. 2+ pedal pulses. No carotid bruits.  Abdomen: no tenderness, no masses palpated. No hepatosplenomegaly. Bowel sounds positive.  Musculoskeletal: no clubbing / cyanosis. No joint deformity upper and lower extremities. Good ROM, no contractures. Normal muscle tone.  Skin: no rashes, lesions, ulcers. No induration Neurologic: CN 2-12 grossly intact. Sensation intact, DTR normal. Strength 5/5 x all 4 extremities.  Psychiatric: Normal judgment and insight. Alert and oriented x 3. Normal mood.   Data Reviewed:  Calcium  10.1 from 11.4  Family Communication: Patient's son Marcey was at bedside  Disposition: Pending PT evaluation and echocardiogram  Status is: Observation The patient remains OBS appropriate and will d/c before 2 midnights.  Planned Discharge Destination: Home and Home with Home Health    Time spent: 35 minutes  Author: Nena Rebel, MD 09/10/2023 2:20 PM  For on call review [email or web address].

## 2023-09-10 NOTE — Evaluation (Signed)
 Physical Therapy Brief Evaluation and Discharge Note Patient Details Name: Jamie [name] MRN: [id number] DOB: 04/21/1934 Today's Date: 09/10/2023   History of Present Illness  89 y.o. female presents to ED via EMS 7/20 s/p syncopal episode. BP in 110-190s, mild dehydration  PMH: Parkinsons disease with mixed parkinsons and alzheimers dementia, labile blood pressure, breast CA stage 1B s/p mastectomy, XRT and remains on hormonal therapy,  Clinical Impression  PTA pt living in her home with caregivers during the day and family members who spend the night. Pt is independent in ambulation in her home, and is able to perform simple ADLs, caregivers assist with bathing in shower and set up for dressing. Per son who is present during session, pt is very close to her baseline level of function, mod I for transfers and contact guard assist for ambulation and stair training.  Pt has no further PT needs. PT signing off.      PT Assessment Patient does not need any further PT services  Assistance Needed at Discharge  Frequent or constant Supervision/Assistance    Equipment Recommendations None recommended by PT     Precautions/Restrictions Precautions Precautions: Fall Restrictions Weight Bearing Restrictions Per Provider Order: No        Mobility  Bed Mobility       General bed mobility comments: sitting up in straight back chair on entry  Transfers Overall transfer level: Modified independent                 General transfer comment: increased time and effort but able to stand and self steady    Ambulation/Gait Ambulation/Gait assistance: Contact guard assist Gait Distance (Feet): 150 Feet Assistive device: None Gait Pattern/deviations: Step-through pattern, Shuffle Gait Speed: Pace WFL General Gait Details: contact guard for safety, moderate pace, shuffling gait with good stride length,  Home Activity Instructions Home Activity Instructions: continue to work with  caregivers on daily activity  Stairs Stairs: Yes Stairs assistance: Contact guard assist Stair Management: Two rails, Forwards, Step to pattern Number of Stairs: 1 (x4) General stair comments: to approximate home set up pt able to step up and back from 1 step 4 times with good power up and descent     Balance Overall balance assessment: Mild deficits observed, not formally tested                        Pertinent Vitals/Pain PT - Brief Vital Signs All Vital Signs Stable: Yes Pain Assessment Pain Assessment: No/denies pain     Home Living Family/patient expects to be discharged to:: Private residence Living Arrangements: Alone;Other (Comment) (pt has caregivers during the day and family spends the night. so she is never alone) Available Help at Discharge: Available 24 hours/day;Personal care attendant;Family Home Environment: Stairs to enter  [place] of Steps: 4 Home Equipment: Rolling Walker (2 wheels);Grab bars - toilet;Grab bars - tub/shower;Hand held shower head;Shower seat        Prior Function Level of Independence: Needs assistance Comments: pt ambulates without assist in her home, gets in shower with caregiver assist for bathing 3x a week, is able to bird bathe independently the rest of the week. with set up patient dresses herself    UE/LE Assessment   UE ROM/Strength/Tone/Coordination: [name]    LE ROM/Strength/Tone/Coordination: Generalized weakness      Communication   Communication Communication: No apparent difficulties     Cognition Overall Cognitive Status: History of cognitive impairments - at baseline Comments: able to  accurately describe home set up and assistance     General Comments General comments (skin integrity, edema, etc.): VSS on RA     Assessment/Plan       PT Visit Diagnosis Other symptoms and signs involving the nervous system (R29.898)    No Skilled PT All education completed;Patient at baseline level of  functioning;Patient is modified independent with all activity/mobility;Patient will have necessary level of assist by caregiver at discharge    AMPAC 6 Clicks Help needed turning from your back to your side while in a flat bed without using bedrails?: None Help needed moving from lying on your back to sitting on the side of a flat bed without using bedrails?: None Help needed moving to and from a bed to a chair (including a wheelchair)?: None Help needed standing up from a chair using your arms (e.g., wheelchair or bedside chair)?: None Help needed to walk in [name] room?: A Little Help needed climbing 3-5 steps with a railing? : A Little 6 Click Score: 22      End of Session Equipment Utilized During Treatment: Gait belt Activity Tolerance: Patient tolerated treatment well Patient left: in chair;with call bell/phone within reach;with family/visitor present Nurse Communication: Mobility status PT Visit Diagnosis: Other symptoms and signs involving the nervous system (R29.898)     Time: 0902-0920 PT Time Calculation (min) (ACUTE ONLY): 18 min  Charges:   PT Evaluation $PT Eval Low Complexity: 1 Low      Jamie [name] PT, DPT Acute Rehabilitation Services Please use secure chat or  Call Office [phone]   Jamie [name]  09/10/2023, 9:40 AM

## 2023-09-10 NOTE — Hospital Course (Signed)
 89 y.o. female with hx  Parkinsons disease with mixed parkinsons and alzheimers dementia, labile blood pressure, breast CA stage 1B s/p mastectomy, XRT and remains on hormonal therapy, who is transferred from [facility] ED for syncopal episode. She has been evaluated and found to have some low BP but improved.  Her calcium  was 11.4 and resolved back to normal.  Echo is pending.  PT is pending.  Patient still feels weak and not well.

## 2023-09-10 NOTE — Progress Notes (Signed)
 Echocardiogram 2D Echocardiogram has been performed.  Damien FALCON Friend Dorfman RDCS 09/10/2023, 3:01 PM

## 2023-09-10 NOTE — Plan of Care (Signed)
   Problem: Education: Goal: Knowledge of General Education information will improve Description Including pain rating scale, medication(s)/side effects and non-pharmacologic comfort measures Outcome: Progressing   Problem: Activity: Goal: Risk for activity intolerance will decrease Outcome: Progressing   Problem: Safety: Goal: Ability to remain free from injury will improve Outcome: Progressing

## 2023-09-10 NOTE — Plan of Care (Signed)
   Problem: Education: Goal: Knowledge of General Education information will improve Description: Including pain rating scale, medication(s)/side effects and non-pharmacologic comfort measures Outcome: Progressing   Problem: Clinical Measurements: Goal: Respiratory complications will improve Outcome: Progressing Goal: Cardiovascular complication will be avoided Outcome: Progressing

## 2023-09-10 NOTE — Plan of Care (Signed)

## 2023-09-11 DIAGNOSIS — R55 Syncope and collapse: Secondary | ICD-10-CM | POA: Diagnosis not present

## 2023-09-11 LAB — PARATHYROID HORMONE, INTACT (NO CA): PTH: 35 pg/mL (ref 15–65)

## 2023-09-11 NOTE — Plan of Care (Signed)

## 2023-09-11 NOTE — Progress Notes (Addendum)
 PT Cancellation Note  Patient Details Name: Jamie [name] MRN: [id number] DOB: 10-Sep-1934   Cancelled Treatment:    Reason Eval/Treat Not Completed: PT screened, no needs identified, will sign off. Pt was seen and evaluated yesterday (7/21) by PT with no follow-up physical therapy services indicated. Pt was at a modified independent level for transfers, ambulated without an AD and no external physical assistance, and completed stair training with use of rails but no external physical assistance was required. PT signing off. Please re-consult if pt's status changes. Thank you.   Jamie [name] 09/11/2023, 7:46 AM

## 2023-09-11 NOTE — Discharge Summary (Signed)
 Physician Discharge Summary  Jamie [name] FMW:[id number] DOB: 1934/11/17 DOA: 09/09/2023  PCP: Gayle Saddie [name], PA-C  Admit date: 09/09/2023 Discharge date: 09/11/2023  Admitted From: Home Disposition:  Home  Discharge Condition:Stable CODE STATUS:FULL Diet recommendation: Heart Healthy   Brief/Interim Summary: Patient is a 89 y.o. female with hx  Parkinsons disease with mixed parkinsons and alzheimers dementia, labile blood pressure, breast CA stage 1B s/p mastectomy, XRT and remains on hormonal therapy, who is transferred from [facility] ED for syncopal episode. She has been evaluated and found to have some low BP but improved.  Her calcium  was 11.4 and resolved back to normal.  Echo showed normal EF.  PT recommended no follow-up.  She feels ready to go home today.  Discharge plan discussed with daughter at bedside.  Following problems were addressed during the hospitalization:  Syncope, suspect hypovolemic / labile pressure contributing  Hx syncopal episode while seated, shaking in hands at time suspect either related to PD or convulsive syncope rather than seizure. BP in ED labile 110- 190 systolic without treatment. Some lab markers of dehydration including mild hyperCa and UA with hyaline cast. EKG is LVH, no acute ischemic changes. Currently she is hemodynamically stable.  Blood pressure is stable.  Echo done which showed normal EF, no valvular abnormalities.  Currently she is fully alert and oriented.  Mild hyperCa  Hx intermittent hyperCa on review prior labs, currently 11.4. - Calcium  normalized after IV fluid - Check PTH,result pending, f/u outpatient   Deconditioning:  PT saw her here,no follow up recommended   Chronic medical problems:  Parkinsons disease: Continue home Sinemet   PD / AD dementia: Continue home Donepezil , monitor for any bradycardia  Labile BP: Noted  Breast CA Stage 1B s/p mastectomy / XRT: continue home Letrozole   ? Sleep / mood: continue home Mirtazapine    GERD: sub home PPI    Discharge Diagnoses:  Principal Problem:   Syncope Active Problems:   Hypercalcemia   Labile blood pressure    Discharge Instructions  Discharge Instructions     Diet - low sodium heart healthy   Complete by: As directed    Discharge instructions   Complete by: As directed    1)Please take your medications as instructed 2)Follow up with your PCP. Do a thyroid  function test in 4-6 weeks   Increase activity slowly   Complete by: As directed       Allergies as of 09/11/2023       Reactions   Flagyl  [metronidazole ] Rash        Medication List     TAKE these medications    carbidopa -levodopa  50-200 MG tablet Commonly known as: SINEMET  CR TAKE 1 TABLET BY MOUTH AT BEDTIME.   carbidopa -levodopa  25-100 MG tablet Commonly known as: SINEMET  IR TAKE 2 TABLETS BY MOUTH 3 TIMES DAILY.   donepezil  10 MG tablet Commonly known as: ARICEPT  TAKE 1 TABLET (10 MG TOTAL) BY MOUTH AT BEDTIME. What changed: when to take this   letrozole  2.5 MG tablet Commonly known as: FEMARA  TAKE 1 TABLET (2.5 MG TOTAL) BY MOUTH DAILY. What changed: when to take this   mirtazapine  15 MG tablet Commonly known as: REMERON  TAKE 1 TABLET (15 MG TOTAL) BY MOUTH AT BEDTIME.   omeprazole  20 MG capsule Commonly known as: PRILOSEC TAKE 1 CAPSULE BY MOUTH DAILY AT 5:30 PM   OVER THE COUNTER MEDICATION Take 2 tablets by mouth daily after lunch. Joint Health.   OVER THE COUNTER MEDICATION Take 2 tablets by  mouth daily after breakfast. Shaklee Cholesterol Reduction Complex   OVER THE COUNTER MEDICATION Take 1 tablet by mouth daily after breakfast. Shaklee OptiFlora   OVER THE COUNTER MEDICATION Take 2 tablets by mouth at bedtime as needed (constipation). Shaklee Herb-Lax   PRESERVISION AREDS PO Take 1 capsule by mouth See admin instructions. Take 1 capsule by mouth twice daily, 1 capsule after breakfast and 1 capsule after lunch.   rosuvastatin  5 MG  tablet Commonly known as: CRESTOR  Take 1 tablet (5 mg total) by mouth at bedtime.   VITAMIN D -3 PO Take 1 capsule by mouth daily after lunch.   VITAMIN E PO Take 1 capsule by mouth daily after lunch.        Follow-up Information     Gayle Saddie [place], [place]. Schedule an appointment as soon as possible for a visit in 1 week(s).   Specialty: Physician Assistant Contact information: [address] [place] [address] [phone]                Allergies  Allergen Reactions   Flagyl  [Metronidazole ] Rash    Consultations: None   Procedures/Studies: ECHOCARDIOGRAM COMPLETE Result Date: 09/10/2023    ECHOCARDIOGRAM REPORT   Patient Name:   Jamie [name] Date of Exam: 09/10/2023 Medical Rec #:  [id number]       Height:       68.0 in Accession #:    [id number]      Weight:       187.4 lb Date of Birth:  10-31-1934       BSA:          1.988 m Patient Age:    89 years        BP:           142/64 mmHg Patient Gender: F               HR:           68 bpm. Exam Location:  Inpatient Procedure: 2D Echo, Color Doppler and Cardiac Doppler (Both Spectral and Color            Flow Doppler were utilized during procedure). Indications:    R55 Syncope  History:        Patient has prior history of Echocardiogram examinations, most                 recent 04/26/2022. Risk Factors:Hypertension, Diabetes and                 Dyslipidemia.  Sonographer:    Damien Senior RDCS Referring Phys: [id number] [facility] PAUDEL IMPRESSIONS  1. Left ventricular ejection fraction, by estimation, is 70 to 75%. The left ventricle has hyperdynamic function. The left ventricle has no regional wall motion abnormalities. There is mild left ventricular hypertrophy of the basal-septal segment. Left ventricular diastolic parameters are consistent with Grade I diastolic dysfunction (impaired relaxation).  2. Right ventricular systolic function is normal. The right ventricular size is normal. Tricuspid regurgitation signal is  inadequate for assessing PA pressure.  3. Left atrial size was mildly dilated.  4. The mitral valve is normal in structure. Trivial mitral valve regurgitation. Moderate mitral annular calcification.  5. The aortic valve is tricuspid. Aortic valve regurgitation is not visualized.  6. The inferior vena cava is normal in size with greater than 50% respiratory variability, suggesting right atrial pressure of 3 mmHg. Comparison(s): Prior images reviewed side by side. FINDINGS  Left Ventricle: Left ventricular ejection fraction, by  estimation, is 70 to 75%. The left ventricle has hyperdynamic function. The left ventricle has no regional wall motion abnormalities. The left ventricular internal cavity size was normal in size. There is mild left ventricular hypertrophy of the basal-septal segment. Left ventricular diastolic parameters are consistent with Grade I diastolic dysfunction (impaired relaxation). Right Ventricle: The right ventricular size is normal. No increase in right ventricular wall thickness. Right ventricular systolic function is normal. Tricuspid regurgitation signal is inadequate for assessing PA pressure. Left Atrium: Left atrial size was mildly dilated. Right Atrium: Right atrial size was normal in size. Pericardium: There is no evidence of pericardial effusion. Presence of epicardial fat layer. Mitral Valve: The mitral valve is normal in structure. Moderate mitral annular calcification. Trivial mitral valve regurgitation. Tricuspid Valve: The tricuspid valve is normal in structure. Tricuspid valve regurgitation is trivial. Aortic Valve: The aortic valve is tricuspid. Aortic valve regurgitation is not visualized. Pulmonic Valve: The pulmonic valve was normal in structure. Pulmonic valve regurgitation is trivial. Aorta: The aortic root and ascending aorta are structurally normal, with no evidence of dilitation. Venous: The inferior vena cava is normal in size with greater than 50% respiratory variability,  suggesting right atrial pressure of 3 mmHg. IAS/Shunts: No atrial level shunt detected by color flow Doppler.  LEFT VENTRICLE PLAX 2D LVIDd:         3.90 cm   Diastology LVIDs:         2.30 cm   LV e' medial:    5.66 cm/s LV PW:         0.90 cm   LV E/e' medial:  15.3 LV IVS:        1.20 cm   LV e' lateral:   6.53 cm/s LVOT diam:     1.80 cm   LV E/e' lateral: 13.2 LV SV:         56 LV SV Index:   28 LVOT Area:     2.54 cm  RIGHT VENTRICLE RV S prime:     11.30 cm/s TAPSE (M-mode): 1.7 cm LEFT ATRIUM             Index        RIGHT ATRIUM           Index LA diam:        3.50 cm 1.76 cm/m   RA Area:     11.50 cm LA Vol (A2C):   50.7 ml 25.50 ml/m  RA Volume:   18.60 ml  9.36 ml/m LA Vol (A4C):   49.7 ml 25.00 ml/m LA Biplane Vol: 53.3 ml 26.81 ml/m  AORTIC VALVE LVOT Vmax:   86.20 cm/s LVOT Vmean:  65.300 cm/s LVOT VTI:    0.222 m  AORTA Ao Root diam: 2.70 cm Ao Asc diam:  3.10 cm MITRAL VALVE MV Area (PHT): 2.32 cm     SHUNTS MV Decel Time: 327 msec     Systemic VTI:  0.22 m MV E velocity: 86.50 cm/s   Systemic Diam: 1.80 cm MV A velocity: 128.00 cm/s MV E/A ratio:  0.68 Mihai Croitoru MD Electronically signed by Jerel Balding MD Signature Date/Time: 09/10/2023/3:58:27 PM    Final    CT Head Wo Contrast Result Date: 09/09/2023 CLINICAL DATA:  Syncope/presyncope. Evaluate for cerebrovascular calls. EXAM: CT HEAD WITHOUT CONTRAST TECHNIQUE: Contiguous axial images were obtained from the base of the skull through the vertex without intravenous contrast. RADIATION DOSE REDUCTION: This exam was performed according to the departmental dose-optimization program which includes  automated exposure control, adjustment of the mA and/or kV according to patient size and/or use of iterative reconstruction technique. COMPARISON:  Brain MRI from 07/31/2016 FINDINGS: Brain: No evidence of acute infarction, hemorrhage, hydrocephalus, extra-axial collection or mass lesion/mass effect. There is mild diffuse low-attenuation  within the subcortical and periventricular white matter compatible with chronic microvascular disease. More focal confluent area of low attenuation is noted within the subcortical white matter of the left posterior parietal lobe, axial image 20. Prominence of sulci and ventricles compatible with brain atrophy. Vascular: No hyperdense vessel or unexpected calcification. Skull: Normal. Negative for fracture or focal lesion. Sinuses/Orbits: No acute finding. Other: None. IMPRESSION: 1. No acute intracranial abnormalities. 2. Chronic microvascular disease and brain atrophy. 3. More focal confluent area of low attenuation is noted within the subcortical white matter of the left posterior parietal lobe. This is favored to represent a chronic infarct. Electronically Signed   By: Waddell Calk M.D.   On: 09/09/2023 12:40   DG Chest Portable 1 View Result Date: 09/09/2023 CLINICAL DATA:  Syncope EXAM: PORTABLE CHEST 1 VIEW COMPARISON:  None Available. FINDINGS: The heart size and mediastinal contours are within normal limits. Both lungs are clear. The visualized skeletal structures are unremarkable. IMPRESSION: No active disease. Electronically Signed   By: Dorethia Molt M.D.   On: 09/09/2023 11:58      Subjective: Patient seen and examined at bedside today.  Hemodynamically stable.  Overall comfortable, sitting in the chair.  Medically stable for discharge to home today.  Discharge planning discussed with daughter at bedside  Discharge Exam: Vitals:   09/11/23 0444 09/11/23 0806  BP: (!) 147/68 (!) 147/66  Pulse: 69 67  Resp: 18 16  Temp: 97.8 F (36.6 C) 97.6 F (36.4 C)  SpO2: 98% 98%   Vitals:   09/11/23 0000 09/11/23 0444 09/11/23 0500 09/11/23 0806  BP: (!) 143/65 (!) 147/68  (!) 147/66  Pulse: 72 69  67  Resp: 18 18  16   Temp: 97.6 F (36.4 C) 97.8 F (36.6 C)  97.6 F (36.4 C)  TempSrc:    Oral  SpO2: 97% 98%  98%  Weight:   84.3 kg   Height:        General: Pt is alert, awake,  not in acute distress Cardiovascular: RRR, S1/S2 +, no rubs, no gallops Respiratory: CTA bilaterally, no wheezing, no rhonchi Abdominal: Soft, NT, ND, bowel sounds + Extremities: no edema, no cyanosis    The results of significant diagnostics from this hospitalization (including imaging, microbiology, ancillary and laboratory) are listed below for reference.     Microbiology: No results found for this or any previous visit (from the past 240 hours).   Labs: BNP (last 3 results) No results for input(s): BNP in the last 8760 hours. Basic Metabolic Panel: Recent Labs  Lab 09/09/23 1134 09/10/23 0431  NA 138 141  K 4.3 3.9  CL 104 108  CO2 23 23  GLUCOSE 124* 110*  BUN 23 20  CREATININE 0.87 0.71  CALCIUM  11.4* 10.1  MG  --  2.0  PHOS  --  3.1   Liver Function Tests: Recent Labs  Lab 09/09/23 1134  AST 23  ALT <5  ALKPHOS 98  BILITOT 0.5  PROT 7.3  ALBUMIN 4.5   Recent Labs  Lab 09/09/23 1134  LIPASE 38   No results for input(s): AMMONIA in the last 168 hours. CBC: Recent Labs  Lab 09/09/23 1134 09/10/23 0431  WBC 6.4 5.0  NEUTROABS  3.6  --   HGB 13.5 12.9  HCT 41.3 39.5  MCV 94.7 94.3  PLT 193 191   Cardiac Enzymes: No results for input(s): CKTOTAL, CKMB, CKMBINDEX, TROPONINI in the last 168 hours. BNP: Invalid input(s): POCBNP CBG: Recent Labs  Lab 09/09/23 1111  GLUCAP 129*   D-Dimer No results for input(s): DDIMER in the last 72 hours. Hgb A1c No results for input(s): HGBA1C in the last 72 hours. Lipid Profile No results for input(s): CHOL, HDL, LDLCALC, TRIG, CHOLHDL, LDLDIRECT in the last 72 hours. Thyroid  function studies Recent Labs    09/10/23 0431  TSH 6.886*   Anemia work up No results for input(s): VITAMINB12, FOLATE, FERRITIN, TIBC, IRON, RETICCTPCT in the last 72 hours. Urinalysis    Component Value Date/Time   COLORURINE YELLOW 09/09/2023 1345   APPEARANCEUR CLEAR 09/09/2023  1345   LABSPEC 1.018 09/09/2023 1345   PHURINE 7.0 09/09/2023 1345   GLUCOSEU NEGATIVE 09/09/2023 1345   HGBUR NEGATIVE 09/09/2023 1345   BILIRUBINUR NEGATIVE 09/09/2023 1345   KETONESUR NEGATIVE 09/09/2023 1345   PROTEINUR NEGATIVE 09/09/2023 1345   NITRITE NEGATIVE 09/09/2023 1345   LEUKOCYTESUR NEGATIVE 09/09/2023 1345   Sepsis Labs Recent Labs  Lab 09/09/23 1134 09/10/23 0431  WBC 6.4 5.0   Microbiology No results found for this or any previous visit (from the past 240 hours).  Please note: You were cared for by a hospitalist during your hospital stay. Once you are discharged, your primary care physician will handle any further medical issues. Please note that NO REFILLS for any discharge medications will be authorized once you are discharged, as it is imperative that you return to your primary care physician (or establish a relationship with a primary care physician if you do not have one) for your post hospital discharge needs so that they can reassess your need for medications and monitor your lab values.    Time coordinating discharge: 40 minutes  SIGNED:   Ivonne Mustache, MD  [facility] 09/11/2023, 12:01 PM Pager [phone]  If 7PM-7AM, please contact night-coverage [email or web address] Password [email or web address]

## 2023-09-11 NOTE — Progress Notes (Signed)
 Discharge instructions, RX's and follow up appt explained and provided to patient and daughter in law verbalized understanding. Patient left floor via wheelchair accompanied by staff. No c/o pain or shortness of breath at d/c.   Minami Arriaga, Cena Helling, RN

## 2023-09-12 NOTE — Progress Notes (Unsigned)
 [name] [address] [place] [address] Phone: [phone] Subjective:   Jamie [name], am serving as a scribe for Dr. Arthea Claudene.  I'm seeing this patient by the request  of:  Clapp, Kara F, PA-C  CC: bilateral knee pain   YEP:Dlagzrupcz  07/05/2023 Severe nearly bone-on-bone arthritic changes of the knees noted.  Patient has the underlying Parkinson's disease that does seem to cause more difficulty as well.  Discussed icing regimen and home exercises, which activities to do and which ones to avoid.  Increase activity slowly otherwise.  Discussed icing regimen.  Discussed home exercises.  Follow-up again in 8 to 10 weeks weeks otherwise.  Due to patient's comorbidities patient is not a surgical candidate.  Social determinants of health include decreased physical activity with patient being a relatively significant fall risk.      Update 09/13/2023 Jamie [name] is a 89 y.o. female coming in with complaint of B knee pain. Patient last seen in May 2025. Patient states that her pain has been intermittent.       Past Medical History:  Diagnosis Date   Abnormality of gait 04/18/2017   Breast cancer (HCC)    Left breast mastectomy.   Degenerative joint disease of knee, right 03/19/2018   Injected April 18, 2018 Approved for Durolane R knee 3.4.20. Patient given another steroid injection Jun 24, 2018.  Repeat injection September 09, 2018 repeat injection February 27, 2019 repeat 6/110/20221 bilateral injections given December 01, 2019 Right knee injected April 20, 2020 monovisc May 19, 2018 Repeat steroid injection given June 16, 2020 repeat injection August 24, 2020   Essential hypertension 08/14/2016   Family history of breast cancer 07/14/2021   Generalized anxiety disorder 06/27/2017   History of dizziness 06/27/2017   History of hysterectomy for benign disease 06/27/2017   History of vitamin D  deficiency 08/22/2017   Leukocytes in urine  08/14/2016   Lumbar facet arthropathy 06/27/2017   Major neurocognitive disorder due to multiple etiologies 09/29/2020   Mixed hyperlipidemia 08/22/2017   Osteoarthritis of knee 09/18/2017   Pancolitis 08/14/2016   Parkinson's disease (HCC) 03/13/2017   Personal history of chemotherapy    Prediabetes 08/22/2017   Right knee pain    Spinal stenosis of lumbar region    Weakness of both hips 04/18/2017   White coat syndrome without diagnosis of hypertension 08/22/2017   Past Surgical History:  Procedure Laterality Date   BREAST BIOPSY Left    06/2021   HYSTERECTOMY ABDOMINAL WITH SALPINGECTOMY     MASTECTOMY W/ SENTINEL NODE BIOPSY Left 08/18/2021   Procedure: LEFT MASTECTOMY WITH TARGETED LYMPH NODE DISSECTION;  Surgeon: Vernetta Berg, MD;  Location: [facility];  Service: General;  Laterality: Left;   TONSILLECTOMY     TOTAL ABDOMINAL HYSTERECTOMY W/ BILATERAL SALPINGOOPHORECTOMY     Social History   Socioeconomic History   Marital status: Widowed    Spouse name: Not on file   Number of children: 3   Years of education: 12   Highest education level: High school graduate  Occupational History   Not on file  Tobacco Use   Smoking status: Never    Passive exposure: Never   Smokeless tobacco: Never  Vaping Use   Vaping status: Never Used  Substance and Sexual Activity   Alcohol use: No    Alcohol/week: 0.0 standard drinks of alcohol   Drug use: No   Sexual activity: Not Currently    Birth control/protection:  Surgical  Other Topics Concern   Not on file  Social History Narrative   Pt lives alone in her 1 story home- she has 3 sons, and a high school graduate. She is right handed, she drinks coffee, and one soda a week mostly water   Social Drivers of Health   Financial Resource Strain: Low Risk  (09/07/2022)   Overall Financial Resource Strain (CARDIA)    Difficulty of Paying Living Expenses: Not hard at all  Food Insecurity: No Food Insecurity  (09/09/2023)   Hunger Vital Sign    Worried About Running Out of Food in the Last Year: Never true    Ran Out of Food in the Last Year: Never true  Transportation Needs: No Transportation Needs (09/09/2023)   PRAPARE - Administrator, Civil Service (Medical): No    Lack of Transportation (Non-Medical): No  Physical Activity: Insufficiently Active (09/07/2022)   Exercise Vital Sign    Days of Exercise per Week: 2 days    Minutes of Exercise per Session: 30 min  Stress: No Stress Concern Present (09/07/2022)   [employer] of Occupational Health - Occupational Stress Questionnaire    Feeling of Stress : Not at all  Social Connections: Moderately Integrated (09/09/2023)   Social Connection and Isolation Panel    Frequency of Communication with Friends and Family: More than three times a week    Frequency of Social Gatherings with Friends and Family: More than three times a week    Attends Religious Services: More than 4 times per year    Active Member of [employer] or Organizations: Yes    Attends Banker Meetings: More than 4 times per year    Marital Status: Widowed   Allergies  Allergen Reactions   Flagyl  [Metronidazole ] Rash   Family History  Problem Relation Age of Onset   Breast cancer Mother 50   Melanoma Father        dx. 40s   Lung cancer Father        dx. 82s   Healthy Son      Current Outpatient Medications (Cardiovascular):    rosuvastatin  (CRESTOR ) 5 MG tablet, Take 1 tablet (5 mg total) by mouth at bedtime.     Current Outpatient Medications (Other):    carbidopa -levodopa  (SINEMET  CR) 50-200 MG tablet, TAKE 1 TABLET BY MOUTH AT BEDTIME.   carbidopa -levodopa  (SINEMET  IR) 25-100 MG tablet, TAKE 2 TABLETS BY MOUTH 3 TIMES DAILY.   Cholecalciferol (VITAMIN D -3 PO), Take 1 capsule by mouth daily after lunch.   donepezil  (ARICEPT ) 10 MG tablet, TAKE 1 TABLET (10 MG TOTAL) BY MOUTH AT BEDTIME. (Patient taking differently: Take 10 mg by mouth  daily before breakfast.)   letrozole  (FEMARA ) 2.5 MG tablet, TAKE 1 TABLET (2.5 MG TOTAL) BY MOUTH DAILY. (Patient taking differently: Take 2.5 mg by mouth daily before breakfast.)   mirtazapine  (REMERON ) 15 MG tablet, TAKE 1 TABLET (15 MG TOTAL) BY MOUTH AT BEDTIME.   Multiple Vitamins-Minerals (PRESERVISION AREDS PO), Take 1 capsule by mouth See admin instructions. Take 1 capsule by mouth twice daily, 1 capsule after breakfast and 1 capsule after lunch.   omeprazole  (PRILOSEC) 20 MG capsule, TAKE 1 CAPSULE BY MOUTH DAILY AT 5:30 PM   OVER THE COUNTER MEDICATION, Take 2 tablets by mouth daily after lunch. Joint Health.   OVER THE COUNTER MEDICATION, Take 2 tablets by mouth daily after breakfast. Shaklee Cholesterol Reduction Complex   OVER THE COUNTER MEDICATION, Take 1 tablet by  mouth daily after breakfast. Shaklee OptiFlora   OVER THE COUNTER MEDICATION, Take 2 tablets by mouth at bedtime as needed (constipation). Shaklee Herb-Lax   VITAMIN E PO, Take 1 capsule by mouth daily after lunch.   Reviewed prior external information including notes and imaging from  primary care provider As well as notes that were available from care everywhere and other healthcare systems.  Past medical history, social, surgical and family history all reviewed in electronic medical record.  No pertanent information unless stated regarding to the chief complaint.   Review of Systems:  No headache, visual changes, nausea, vomiting, diarrhea, constipation, dizziness, abdominal pain, skin rash, fevers, chills, night sweats, weight loss, swollen lymph nodes, body aches, joint swelling, chest pain, shortness of breath, mood changes. POSITIVE muscle aches  Objective  Blood pressure 124/84, pulse 73, height 5' 8 (1.727 m), weight 188 lb (85.3 kg), SpO2 96%.   General: No apparent distress alert and oriented x3 mood and affect normal, dressed appropriately. Masked facies  HEENT: Pupils equal, extraocular movements  intact  Respiratory: Patient's speak in full sentences and does not appear short of breath  Cardiovascular: No lower extremity edema, non tender, no erythema  Bilateral knee pain noted, severe arthritic changes noted.  Patient does have instability with valgus and varus force.  Patient does have a wide-based gait and shuffling gait noted.  After informed written and verbal consent, patient was seated on exam table. Right knee was prepped with alcohol swab and utilizing anterolateral approach, patient's right knee space was injected with 4:1  marcaine  0.5%: Kenalog  40mg /dL. Patient tolerated the procedure well without immediate complications.  After informed written and verbal consent, patient was seated on exam table. Left knee was prepped with alcohol swab and utilizing anterolateral approach, patient's left knee space was injected with 4:1  marcaine  0.5%: Kenalog  40mg /dL. Patient tolerated the procedure well without immediate complications.    Impression and Recommendations:    The above documentation has been reviewed and is accurate and complete Gage Weant M Quincey Quesinberry, DO

## 2023-09-13 ENCOUNTER — Encounter: Payer: Self-pay | Admitting: Family Medicine

## 2023-09-13 ENCOUNTER — Ambulatory Visit: Admitting: Family Medicine

## 2023-09-13 VITALS — BP 124/84 | HR 73 | Ht 68.0 in | Wt 188.0 lb

## 2023-09-13 DIAGNOSIS — M170 Bilateral primary osteoarthritis of knee: Secondary | ICD-10-CM

## 2023-09-13 NOTE — Assessment & Plan Note (Signed)
 Bilateral injections given.  Discussed icing regimen and home exercises.  Discussed which activities to do in which ones to avoid.  Accompanied with son.  Has been walking outside but do need to worry about hydration with patient having a recent syncopal event follow-up again 3 to 4 months

## 2023-09-13 NOTE — Patient Instructions (Addendum)
 Injected both knees today Stay vertical See me in 3 months

## 2023-09-17 ENCOUNTER — Encounter: Payer: Self-pay | Admitting: Physical Therapy

## 2023-09-17 ENCOUNTER — Ambulatory Visit: Attending: Hematology and Oncology | Admitting: Physical Therapy

## 2023-09-17 DIAGNOSIS — Z483 Aftercare following surgery for neoplasm: Secondary | ICD-10-CM | POA: Insufficient documentation

## 2023-09-17 NOTE — Therapy (Signed)
 OUTPATIENT PHYSICAL THERAPY SOZO SCREENING NOTE   Patient Name: Jamie [name] MRN: [id number] DOB:07/04/1934, 89 y.o., female Today's Date: 09/17/2023  PCP: Gayle Saddie [name] DEVONNA REFERRING PROVIDER: Odean Potts, MD   PT End of Session - 09/17/23 1131     Visit Number 18    PT Start Time 1105    PT Stop Time 1120    PT Time Calculation (min) 15 min    Activity Tolerance Patient tolerated treatment well    Behavior During Therapy [facility] for tasks assessed/performed          Past Medical History:  Diagnosis Date   Abnormality of gait 04/18/2017   Breast cancer (HCC)    Left breast mastectomy.   Degenerative joint disease of knee, right 03/19/2018   Injected April 18, 2018 Approved for Durolane R knee 3.4.20. Patient given another steroid injection Jun 24, 2018.  Repeat injection September 09, 2018 repeat injection February 27, 2019 repeat 6/110/20221 bilateral injections given December 01, 2019 Right knee injected April 20, 2020 monovisc May 19, 2018 Repeat steroid injection given June 16, 2020 repeat injection August 24, 2020   Essential hypertension 08/14/2016   Family history of breast cancer 07/14/2021   Generalized anxiety disorder 06/27/2017   History of dizziness 06/27/2017   History of hysterectomy for benign disease 06/27/2017   History of vitamin D  deficiency 08/22/2017   Leukocytes in urine 08/14/2016   Lumbar facet arthropathy 06/27/2017   Major neurocognitive disorder due to multiple etiologies 09/29/2020   Mixed hyperlipidemia 08/22/2017   Osteoarthritis of knee 09/18/2017   Pancolitis 08/14/2016   Parkinson's disease (HCC) 03/13/2017   Personal history of chemotherapy    Prediabetes 08/22/2017   Right knee pain    Spinal stenosis of lumbar region    Weakness of both hips 04/18/2017   White coat syndrome without diagnosis of hypertension 08/22/2017   Past Surgical History:  Procedure Laterality Date   BREAST BIOPSY Left    06/2021   HYSTERECTOMY ABDOMINAL WITH  SALPINGECTOMY     MASTECTOMY W/ SENTINEL NODE BIOPSY Left 08/18/2021   Procedure: LEFT MASTECTOMY WITH TARGETED LYMPH NODE DISSECTION;  Surgeon: Vernetta Berg, MD;  Location: [facility];  Service: General;  Laterality: Left;   TONSILLECTOMY     TOTAL ABDOMINAL HYSTERECTOMY W/ BILATERAL SALPINGOOPHORECTOMY     Patient Active Problem List   Diagnosis Date Noted   Syncope 09/09/2023   Labile blood pressure 09/09/2023   Hypercalcemia 11/09/2022   Osteoarthritis, hip, bilateral 09/07/2022   Lymphedema of left arm 09/07/2022   Gastroesophageal reflux disease without esophagitis 03/19/2022   S/P left mastectomy 08/18/2021   Malignant neoplasm of lower-inner quadrant of left breast in female, estrogen receptor positive (HCC) 07/11/2021   Mild episode of recurrent major depressive disorder (HCC) 04/06/2021   Age-related osteoporosis without current pathological fracture 04/06/2021   Greater trochanteric bursitis, left 01/21/2021   Major neurocognitive disorder due to multiple etiologies 09/29/2020   Osteoarthritis of knees, bilateral 03/19/2018   Osteoarthritis of knee 09/18/2017   Mixed diabetic hyperlipidemia associated with type 2 diabetes mellitus (HCC) 08/22/2017   Type 2 diabetes mellitus (HCC) 08/22/2017   White coat syndrome without diagnosis of hypertension 08/22/2017   Lumbar facet arthropathy 06/27/2017   Generalized anxiety disorder 06/27/2017   Overweight (BMI 25.0-29.9) 06/27/2017   Abnormality of gait 04/18/2017   Spinal stenosis of lumbar region 04/18/2017   Parkinson's disease (HCC) 03/13/2017   Pancolitis 08/14/2016   Essential hypertension 08/14/2016    REFERRING DIAG:  left breast cancer at risk for lymphedema  THERAPY DIAG:  Aftercare following surgery for neoplasm  PERTINENT HISTORY: Patient was diagnosed on 06/27/2021 with left grade 2 invasive ductal carcinoma breast cancer. She underwent a left mastectomy and targeted axillary lymph node  dissection (7/7 nodes positive) on 08/18/2021. It is triple positive with a Ki67 of 40%. She had radiation. Patient has cognitive deficits effecting memory and was diagnosed with Parkinson's in 2020   PRECAUTIONS: left UE Lymphedema risk, Fall  SUBJECTIVE: Here for SOZO screen  PAIN:  Are you having pain? No  Encouraged pt's son to take her to get another compression sleeve from [facility] as hers looks like it's worn out. He verbalized understanding.  SOZO SCREENING: Patient was assessed today using the SOZO machine to determine the lymphedema index score. This was compared to her baseline score. It was determined that she is within the recommended range when compared to her baseline and no further action is needed at this time. She will continue SOZO screenings. These are done every 3 months for 2 years post operatively followed by every 6 months for 2 years, and then annually.   L-DEX FLOWSHEETS - 09/17/23 1100       L-DEX LYMPHEDEMA SCREENING   Measurement Type Unilateral    L-DEX MEASUREMENT EXTREMITY Upper Extremity    POSITION  Standing    DOMINANT SIDE Right    At Risk Side Left    BASELINE SCORE (UNILATERAL) 0.1    L-DEX SCORE (UNILATERAL) -1.8    VALUE CHANGE (UNILAT) -1.9         Eward Wonda Sharps, [place] 09/17/23 11:34 AM

## 2023-10-08 ENCOUNTER — Ambulatory Visit

## 2023-10-08 ENCOUNTER — Other Ambulatory Visit: Payer: Self-pay | Admitting: Neurology

## 2023-10-08 VITALS — BP 158/88 | HR 77 | Ht 68.0 in | Wt 187.0 lb

## 2023-10-08 DIAGNOSIS — R55 Syncope and collapse: Secondary | ICD-10-CM

## 2023-10-08 DIAGNOSIS — E039 Hypothyroidism, unspecified: Secondary | ICD-10-CM | POA: Insufficient documentation

## 2023-10-08 DIAGNOSIS — R5383 Other fatigue: Secondary | ICD-10-CM | POA: Diagnosis not present

## 2023-10-08 DIAGNOSIS — G20A1 Parkinson's disease without dyskinesia, without mention of fluctuations: Secondary | ICD-10-CM

## 2023-10-08 DIAGNOSIS — I10 Essential (primary) hypertension: Secondary | ICD-10-CM

## 2023-10-08 NOTE — Patient Instructions (Signed)
 VISIT SUMMARY: Today, we discussed your persistent feeling of malaise since your recent hospital discharge. We reviewed your recent hospitalization, thyroid  function, and hydration status. We also checked your blood pressure and discussed your current medication regimen.  YOUR PLAN: -SUSPECTED HYPOTHYROIDISM: Hypothyroidism is a condition where your thyroid  gland does not produce enough thyroid  hormone, which can cause symptoms like feeling unwell. We will order additional thyroid  function tests to confirm this and may start you on a low-dose thyroid  medication based on the results. We will communicate the lab results and treatment plan over the phone.  -SYNCOPAL EPISODE DUE TO POOR FLUID INTAKE: When our body does not have enough fluid to support blood flow to our vital organs (such as the brain) we can experience syncopal episodes, which is what you experienced last month. I will check blood work today to ensure that your hydration status has improved. Continue to drink plenty of fluids throughout the day (minimum of 60 oz per day).   INSTRUCTIONS: We will follow up with you over the phone to discuss the results of your thyroid  function tests and any necessary treatment adjustments.  If you have any problems before your next visit feel free to message me via MyChart (minor issues or questions) or call the office, otherwise you may reach out to schedule an office visit.  Thank you! Saddie Sacks, PA-C

## 2023-10-08 NOTE — Progress Notes (Signed)
 Established Patient Office Visit  Subjective   Patient ID: Jamie [name], female    DOB: 1934-05-04  Age: 89 y.o. MRN: [id number]  Chief Complaint  Patient presents with   Hospitalization Follow-up    HPI  Discussed the use of AI scribe software for clinical note transcription with the patient, who gave verbal consent to proceed.  History of Present Illness   Jamie [name] is an 89 year old female who presents for hospital follow up.    Recent hospitalization and electrolyte abnormalities - Hospitalized four weeks ago following a syncopal episode - Found to be dehydrated with elevated thyroid  levels - Received intravenous fluids, resulting in normalization of electrolytes, including calcium  - Parathyroid  hormone levels were normal at that time  Generalized malaise - Persistent malaise since hospital discharge four weeks ago - Describes symptoms as 'I just don't feel good' - No specific aches or changes in energy levels - No dizziness, vision changes upon standing, or leg swelling - No urinary symptoms, including dysuria or changes in urine odor - Sleep is adequate  Thyroid  function abnormalities - Elevated TSH levels noted during recent hospitalization - No prior use of thyroid  medication  Hydration status - Increasing fluid intake since hospital discharge - Continues to feel unwell despite increased hydration       ROS Per HPI.    Objective:     BP (!) 158/88   Pulse 77   Ht 5' 8 (1.727 m)   Wt 187 lb 0.6 oz (84.8 kg)   SpO2 96%   BMI 28.44 kg/m    Physical Exam Constitutional:      General: She is not in acute distress.    Appearance: Normal appearance.  Cardiovascular:     Rate and Rhythm: Normal rate and regular rhythm.     Heart sounds: Normal heart sounds. No murmur heard.    No friction rub. No gallop.  Pulmonary:     Effort: Pulmonary effort is normal. No respiratory distress.     Breath sounds: Normal breath sounds.   Musculoskeletal:        General: No swelling.  Skin:    General: Skin is warm and dry.  Neurological:     General: No focal deficit present.     Mental Status: She is alert.  Psychiatric:        Mood and Affect: Mood normal.        Behavior: Behavior normal.        Thought Content: Thought content normal.    No results found for any visits on 10/08/23.  Last CBC Lab Results  Component Value Date   WBC 5.0 09/10/2023   HGB 12.9 09/10/2023   HCT 39.5 09/10/2023   MCV 94.3 09/10/2023   MCH 30.8 09/10/2023   RDW 13.5 09/10/2023   PLT 191 09/10/2023   Last metabolic panel Lab Results  Component Value Date   GLUCOSE 110 (H) 09/10/2023   NA 141 09/10/2023   K 3.9 09/10/2023   CL 108 09/10/2023   CO2 23 09/10/2023   BUN 20 09/10/2023   CREATININE 0.71 09/10/2023   GFRNONAA >60 09/10/2023   CALCIUM  10.1 09/10/2023   PHOS 3.1 09/10/2023   PROT 7.3 09/09/2023   ALBUMIN 4.5 09/09/2023   LABGLOB 2.3 08/22/2023   AGRATIO 2.0 08/02/2022   BILITOT 0.5 09/09/2023   ALKPHOS 98 09/09/2023   AST 23 09/09/2023   ALT <5 09/09/2023   ANIONGAP 10 09/10/2023   Last lipids Lab Results  Component Value Date   CHOL 192 08/22/2023   HDL 64 08/22/2023   LDLCALC 101 (H) 08/22/2023   LDLDIRECT 95 10/01/2019   TRIG 156 (H) 08/22/2023   CHOLHDL 3.0 08/22/2023   Last hemoglobin A1c Lab Results  Component Value Date   HGBA1C 6.4 (H) 08/22/2023   Last thyroid  functions Lab Results  Component Value Date   TSH 6.886 (H) 09/10/2023   T3TOTAL 82 11/22/2021      The ASCVD Risk score (Arnett DK, et al., 2019) failed to calculate for the following reasons:   The 2019 ASCVD risk score is only valid for ages 62 to 82    Assessment & Plan:   Syncope, unspecified syncope type Assessment & Plan: Discussed etiologies of syncope and discussed lab results from hospital encounter. Reviewed medications and ensured that hypotension is not a factor in these symptoms. Encouraged adequate  hydration. Rechecking labs today (see orders). Will f/u via telephone with results and further plan.  Orders: -     CBC with Differential/Platelet -     Comprehensive metabolic panel with GFR -     TSH -     T4, free -     Urinalysis  Essential hypertension Assessment & Plan: Slightly elevated blood pressure likely situational. Current medication regimen preferred to avoid hypotension. - Recheck blood pressure before leaving. - Maintain current hypertension medication regimen.    Acquired hypothyroidism Assessment & Plan: Elevated TSH with symptoms suggestive of hypothyroidism. No current thyroid  medication. Further evaluation needed. - Order additional thyroid  function tests. - Consider low-dose thyroid  medication if indicated by lab results. - Communicate lab results and treatment plan over the phone. Consider starting low dose levothyroxine when labs return.     Return if symptoms worsen or fail to improve.    Saddie [name] Sacks, PA-C

## 2023-10-08 NOTE — Assessment & Plan Note (Signed)
 Slightly elevated blood pressure likely situational. Current medication regimen preferred to avoid hypotension. - Recheck blood pressure before leaving. - Maintain current hypertension medication regimen.

## 2023-10-08 NOTE — Assessment & Plan Note (Signed)
 Discussed etiologies of syncope and discussed lab results from hospital encounter. Reviewed medications and ensured that hypotension is not a factor in these symptoms. Encouraged adequate hydration. Rechecking labs today (see orders). Will f/u via telephone with results and further plan.

## 2023-10-08 NOTE — Assessment & Plan Note (Signed)
 Elevated TSH with symptoms suggestive of hypothyroidism. No current thyroid  medication. Further evaluation needed. - Order additional thyroid  function tests. - Consider low-dose thyroid  medication if indicated by lab results. - Communicate lab results and treatment plan over the phone. Consider starting low dose levothyroxine when labs return.

## 2023-10-09 ENCOUNTER — Encounter: Payer: Self-pay | Admitting: Hematology and Oncology

## 2023-10-09 LAB — CBC WITH DIFFERENTIAL/PLATELET
Basophils Absolute: 0.1 x10E3/uL (ref 0.0–0.2)
Basos: 1 %
EOS (ABSOLUTE): 0.2 x10E3/uL (ref 0.0–0.4)
Eos: 3 %
Hematocrit: 41.6 % (ref 34.0–46.6)
Hemoglobin: 13.4 g/dL (ref 11.1–15.9)
Immature Grans (Abs): 0 x10E3/uL (ref 0.0–0.1)
Immature Granulocytes: 0 %
Lymphocytes Absolute: 1.7 x10E3/uL (ref 0.7–3.1)
Lymphs: 28 %
MCH: 30.8 pg (ref 26.6–33.0)
MCHC: 32.2 g/dL (ref 31.5–35.7)
MCV: 96 fL (ref 79–97)
Monocytes Absolute: 0.7 x10E3/uL (ref 0.1–0.9)
Monocytes: 12 %
Neutrophils Absolute: 3.4 x10E3/uL (ref 1.4–7.0)
Neutrophils: 56 %
Platelets: 215 x10E3/uL (ref 150–450)
RBC: 4.35 x10E6/uL (ref 3.77–5.28)
RDW: 13.1 % (ref 11.7–15.4)
WBC: 6.2 x10E3/uL (ref 3.4–10.8)

## 2023-10-09 LAB — COMPREHENSIVE METABOLIC PANEL WITH GFR
ALT: 11 IU/L (ref 0–32)
AST: 21 IU/L (ref 0–40)
Albumin: 4.5 g/dL (ref 3.7–4.7)
Alkaline Phosphatase: 99 IU/L (ref 44–121)
BUN/Creatinine Ratio: 29 — ABNORMAL HIGH (ref 12–28)
BUN: 22 mg/dL (ref 8–27)
Bilirubin Total: 0.4 mg/dL (ref 0.0–1.2)
CO2: 21 mmol/L (ref 20–29)
Calcium: 10.8 mg/dL — ABNORMAL HIGH (ref 8.7–10.3)
Chloride: 103 mmol/L (ref 96–106)
Creatinine, Ser: 0.76 mg/dL (ref 0.57–1.00)
Globulin, Total: 2.3 g/dL (ref 1.5–4.5)
Glucose: 129 mg/dL — ABNORMAL HIGH (ref 70–99)
Potassium: 4.5 mmol/L (ref 3.5–5.2)
Sodium: 138 mmol/L (ref 134–144)
Total Protein: 6.8 g/dL (ref 6.0–8.5)
eGFR: 76 mL/min/1.73 (ref >59)

## 2023-10-09 LAB — POCT URINALYSIS DIP (CLINITEK)
Bilirubin, UA: NEGATIVE
Blood, UA: NEGATIVE
Glucose, UA: NEGATIVE mg/dL
Ketones, POC UA: NEGATIVE mg/dL
Leukocytes, UA: NEGATIVE
Nitrite, UA: NEGATIVE
POC PROTEIN,UA: NEGATIVE
Spec Grav, UA: 1.015 (ref 1.010–1.025)
Urobilinogen, UA: 0.2 U/dL
pH, UA: 6.5 (ref 5.0–8.0)

## 2023-10-09 LAB — TSH: TSH: 3.1 u[IU]/mL (ref 0.450–4.500)

## 2023-10-09 LAB — T4, FREE: Free T4: 1.16 ng/dL (ref 0.82–1.77)

## 2023-10-09 LAB — URINALYSIS

## 2023-10-09 NOTE — Addendum Note (Signed)
 Addended by: GRANDVILLE DELAYNE MATSU on: 10/09/2023 08:49 AM   Modules accepted: Orders

## 2023-10-10 ENCOUNTER — Ambulatory Visit: Payer: Self-pay

## 2023-10-11 NOTE — Telephone Encounter (Signed)
 Called and spoke with Kameela's caretaker - Joyce/ related message from provider.

## 2023-10-15 ENCOUNTER — Other Ambulatory Visit: Payer: Self-pay | Admitting: Hematology and Oncology

## 2023-10-15 DIAGNOSIS — Z1231 Encounter for screening mammogram for malignant neoplasm of breast: Secondary | ICD-10-CM

## 2023-10-30 ENCOUNTER — Other Ambulatory Visit: Payer: Self-pay | Admitting: Family Medicine

## 2023-10-30 ENCOUNTER — Other Ambulatory Visit: Payer: Self-pay | Admitting: Hematology and Oncology

## 2023-10-30 DIAGNOSIS — K219 Gastro-esophageal reflux disease without esophagitis: Secondary | ICD-10-CM

## 2023-10-30 DIAGNOSIS — R12 Heartburn: Secondary | ICD-10-CM

## 2023-11-01 ENCOUNTER — Ambulatory Visit
Admission: RE | Admit: 2023-11-01 | Discharge: 2023-11-01 | Disposition: A | Discharge status: Home / Self Care | Source: Ambulatory Visit | Attending: Hematology and Oncology | Admitting: Hematology and Oncology

## 2023-11-01 DIAGNOSIS — Z1231 Encounter for screening mammogram for malignant neoplasm of breast: Secondary | ICD-10-CM

## 2023-11-06 ENCOUNTER — Encounter: Payer: Self-pay | Admitting: Neurology

## 2023-11-27 ENCOUNTER — Other Ambulatory Visit: Payer: Self-pay | Admitting: Neurology

## 2023-11-27 DIAGNOSIS — G20A1 Parkinson's disease without dyskinesia, without mention of fluctuations: Secondary | ICD-10-CM

## 2023-11-29 DIAGNOSIS — C4441 Basal cell carcinoma of skin of scalp and neck: Secondary | ICD-10-CM | POA: Diagnosis not present

## 2023-11-29 DIAGNOSIS — Z85828 Personal history of other malignant neoplasm of skin: Secondary | ICD-10-CM | POA: Diagnosis not present

## 2023-11-29 DIAGNOSIS — C44319 Basal cell carcinoma of skin of other parts of face: Secondary | ICD-10-CM | POA: Diagnosis not present

## 2023-12-12 NOTE — Progress Notes (Signed)
 [name] [address] [place] [address] Phone: [phone] Subjective:   Jamie [name], am serving as a scribe for Dr. Arthea [name].  I'm seeing this patient by the request  of:  Jamie Saddie FALCON, PA-C  CC: bilateral knee pain   YEP:Dlagzrupcz  09/13/2023 Bilateral injections given.  Discussed icing regimen and home exercises.  Discussed which activities to do in which ones to avoid.  Accompanied with son.  Has been walking outside but do need to worry about hydration with patient having a recent syncopal event follow-up again 3 to 4 months      Update 12/18/2023 Jamie [name] is a 89 y.o. female coming in with complaint of B knee pain. Patient states having more discomfort in the knees again.  Affecting patient with her walking.  Can be an aching discomfort but does still get some improvement with the injections previously.  Allows her to do activities of daily living on her own.  I       Past Medical History:  Diagnosis Date   Abnormality of gait 04/18/2017   Breast cancer (HCC)    Left breast mastectomy.   Degenerative joint disease of knee, right 03/19/2018   Injected April 18, 2018 Approved for Durolane R knee 3.4.20. Patient given another steroid injection Jun 24, 2018.  Repeat injection September 09, 2018 repeat injection February 27, 2019 repeat 6/110/20221 bilateral injections given December 01, 2019 Right knee injected April 20, 2020 monovisc May 19, 2018 Repeat steroid injection given June 16, 2020 repeat injection August 24, 2020   Essential hypertension 08/14/2016   Family history of breast cancer 07/14/2021   Generalized anxiety disorder 06/27/2017   History of dizziness 06/27/2017   History of hysterectomy for benign disease 06/27/2017   History of vitamin D  deficiency 08/22/2017   Leukocytes in urine 08/14/2016   Lumbar facet arthropathy 06/27/2017   Major neurocognitive disorder due to multiple etiologies 09/29/2020   Mixed  hyperlipidemia 08/22/2017   Osteoarthritis of knee 09/18/2017   Pancolitis 08/14/2016   Parkinson's disease (HCC) 03/13/2017   Personal history of chemotherapy    Prediabetes 08/22/2017   Right knee pain    Spinal stenosis of lumbar region    Weakness of both hips 04/18/2017   White coat syndrome without diagnosis of hypertension 08/22/2017   Past Surgical History:  Procedure Laterality Date   BREAST BIOPSY Left    06/2021   HYSTERECTOMY ABDOMINAL WITH SALPINGECTOMY     MASTECTOMY W/ SENTINEL NODE BIOPSY Left 08/18/2021   Procedure: LEFT MASTECTOMY WITH TARGETED LYMPH NODE DISSECTION;  Surgeon: Vernetta Berg, MD;  Location: [facility];  Service: General;  Laterality: Left;   TONSILLECTOMY     TOTAL ABDOMINAL HYSTERECTOMY W/ BILATERAL SALPINGOOPHORECTOMY     Social History   Socioeconomic History   Marital status: Widowed    Spouse name: Not on file   Number of children: 3   Years of education: 12   Highest education level: High school graduate  Occupational History   Not on file  Tobacco Use   Smoking status: Never    Passive exposure: Never   Smokeless tobacco: Never  Vaping Use   Vaping status: Never Used  Substance and Sexual Activity   Alcohol use: No    Alcohol/week: 0.0 standard drinks of alcohol   Drug use: No   Sexual activity: Not Currently    Birth control/protection: Surgical  Other Topics Concern   Not on file  Social History Narrative   Pt lives alone in her 1 story home- she has 3 sons, and a high garment/textile technologist. She is right handed, she drinks coffee, and one soda a week mostly water   Social Drivers of Health   Financial Resource Strain: Low Risk  (09/07/2022)   Overall Financial Resource Strain (CARDIA)    Difficulty of Paying Living Expenses: Not hard at all  Food Insecurity: No Food Insecurity (09/09/2023)   Hunger Vital Sign    Worried About Running Out of Food in the Last Year: Never true    Ran Out of Food in the Last  Year: Never true  Transportation Needs: No Transportation Needs (09/09/2023)   PRAPARE - Administrator, Civil Service (Medical): No    Lack of Transportation (Non-Medical): No  Physical Activity: Insufficiently Active (09/07/2022)   Exercise Vital Sign    Days of Exercise per Week: 2 days    Minutes of Exercise per Session: 30 min  Stress: No Stress Concern Present (09/07/2022)   [employer] of Occupational Health - Occupational Stress Questionnaire    Feeling of Stress : Not at all  Social Connections: Moderately Integrated (09/09/2023)   Social Connection and Isolation Panel    Frequency of Communication with Friends and Family: More than three times a week    Frequency of Social Gatherings with Friends and Family: More than three times a week    Attends Religious Services: More than 4 times per year    Active Member of [employer] or Organizations: Yes    Attends Banker Meetings: More than 4 times per year    Marital Status: Widowed   Allergies  Allergen Reactions   Flagyl  [Metronidazole ] Rash   Family History  Problem Relation Age of Onset   Breast cancer Mother 27   Melanoma Father        dx. 18s   Lung cancer Father        dx. 42s   Healthy Son      Current Outpatient Medications (Cardiovascular):    rosuvastatin  (CRESTOR ) 5 MG tablet, Take 1 tablet (5 mg total) by mouth at bedtime.     Current Outpatient Medications (Other):    carbidopa -levodopa  (SINEMET  CR) 50-200 MG tablet, TAKE 1 TABLET BY MOUTH AT BEDTIME.   carbidopa -levodopa  (SINEMET  IR) 25-100 MG tablet, TAKE 2 TABLETS BY MOUTH 3 TIMES DAILY.   Cholecalciferol (VITAMIN D -3 PO), Take 1 capsule by mouth daily after lunch.   donepezil  (ARICEPT ) 10 MG tablet, TAKE 1 TABLET (10 MG TOTAL) BY MOUTH AT BEDTIME. (Patient taking differently: Take 10 mg by mouth daily before breakfast.)   letrozole  (FEMARA ) 2.5 MG tablet, TAKE 1 TABLET (2.5 MG TOTAL) BY MOUTH DAILY.   mirtazapine  (REMERON ) 15  MG tablet, TAKE 1 TABLET (15 MG TOTAL) BY MOUTH AT BEDTIME.   Multiple Vitamins-Minerals (PRESERVISION AREDS PO), Take 1 capsule by mouth See admin instructions. Take 1 capsule by mouth twice daily, 1 capsule after breakfast and 1 capsule after lunch.   omeprazole  (PRILOSEC) 20 MG capsule, TAKE 1 CAPSULE BY MOUTH DAILY AT 5:30 PM   OVER THE COUNTER MEDICATION, Take 2 tablets by mouth daily after lunch. Joint Health.   OVER THE COUNTER MEDICATION, Take 2 tablets by mouth daily after breakfast. Shaklee Cholesterol Reduction Complex   OVER THE COUNTER MEDICATION, Take 1 tablet by mouth daily after breakfast. Shaklee OptiFlora   OVER THE COUNTER MEDICATION, Take 2 tablets by mouth at bedtime as needed (constipation).  Shaklee Herb-Lax   VITAMIN E PO, Take 1 capsule by mouth daily after lunch.   Reviewed prior external information including notes and imaging from  primary care provider As well as notes that were available from care everywhere and other healthcare systems.  Past medical history, social, surgical and family history all reviewed in electronic medical record.  No pertanent information unless stated regarding to the chief complaint.   Review of Systems:  No headache, visual changes, nausea, vomiting, diarrhea, constipation, dizziness, abdominal pain, skin rash, fevers, chills, night sweats, weight loss, swollen lymph nodes, body aches, joint swelling, chest pain, shortness of breath, mood changes. POSITIVE muscle aches  Objective  Blood pressure 112/76, pulse 89, height 5' 8 (1.727 m), weight 194 lb (88 kg), SpO2 97%.   General: No apparent distress alert and oriented x3 mood and affect normal, dressed appropriately.  HEENT: Pupils equal, extraocular movements intact  Respiratory: Patient's speak in full sentences and does not appear short of breath  Cardiovascular: No lower extremity edema, non tender, no erythema  Shuffling gait  Knee exam shows    After informed written  and verbal consent, patient was seated on exam table. Right knee was prepped with alcohol swab and utilizing anterolateral approach, patient's right knee space was injected with 4:1  marcaine  0.5%: Depo-Medrol 40mg /dL. Patient tolerated the procedure well without immediate complications.  After informed written and verbal consent, patient was seated on exam table. Left knee was prepped with alcohol swab and utilizing anterolateral approach, patient's left knee space was injected with 4:1  marcaine  0.5%: Depo-Medrol 40mg /dL. Patient tolerated the procedure well without immediate complications.   Impression and Recommendations:    The above documentation has been reviewed and is accurate and complete Lanier Felty M Basilio Meadow, DO

## 2023-12-13 DIAGNOSIS — M2042 Other hammer toe(s) (acquired), left foot: Secondary | ICD-10-CM | POA: Diagnosis not present

## 2023-12-13 DIAGNOSIS — L84 Corns and callosities: Secondary | ICD-10-CM | POA: Diagnosis not present

## 2023-12-13 DIAGNOSIS — I739 Peripheral vascular disease, unspecified: Secondary | ICD-10-CM | POA: Diagnosis not present

## 2023-12-13 DIAGNOSIS — I70203 Unspecified atherosclerosis of native arteries of extremities, bilateral legs: Secondary | ICD-10-CM | POA: Diagnosis not present

## 2023-12-13 DIAGNOSIS — B351 Tinea unguium: Secondary | ICD-10-CM | POA: Diagnosis not present

## 2023-12-18 ENCOUNTER — Encounter: Payer: Self-pay | Admitting: Family Medicine

## 2023-12-18 ENCOUNTER — Ambulatory Visit: Admitting: Family Medicine

## 2023-12-18 VITALS — BP 112/76 | HR 89 | Ht 68.0 in | Wt 194.0 lb

## 2023-12-18 DIAGNOSIS — G20B1 Parkinson's disease with dyskinesia, without mention of fluctuations: Secondary | ICD-10-CM

## 2023-12-18 DIAGNOSIS — M170 Bilateral primary osteoarthritis of knee: Secondary | ICD-10-CM

## 2023-12-18 NOTE — Assessment & Plan Note (Signed)
 Still underlying concern on this as well.  Discussed icing regimen and home exercises, discussed which activities to do and which ones to avoid.  Increase activity slowly.  Follow-up again in 6 to 8 weeks

## 2023-12-18 NOTE — Patient Instructions (Addendum)
 Good to see you! See you again in 8-10 weeks Careful with the stick Injections in knees today

## 2023-12-27 ENCOUNTER — Encounter (HOSPITAL_BASED_OUTPATIENT_CLINIC_OR_DEPARTMENT_OTHER): Payer: Self-pay | Admitting: Emergency Medicine

## 2023-12-27 ENCOUNTER — Other Ambulatory Visit: Payer: Self-pay

## 2023-12-27 ENCOUNTER — Emergency Department (HOSPITAL_BASED_OUTPATIENT_CLINIC_OR_DEPARTMENT_OTHER): Admission: EM | Admit: 2023-12-27 | Discharge: 2023-12-27 | Disposition: A | Discharge status: Home / Self Care

## 2023-12-27 DIAGNOSIS — R4182 Altered mental status, unspecified: Secondary | ICD-10-CM | POA: Diagnosis present

## 2023-12-27 DIAGNOSIS — N390 Urinary tract infection, site not specified: Secondary | ICD-10-CM

## 2023-12-27 DIAGNOSIS — F0280 Dementia in other diseases classified elsewhere without behavioral disturbance: Secondary | ICD-10-CM | POA: Insufficient documentation

## 2023-12-27 DIAGNOSIS — G309 Alzheimer's disease, unspecified: Secondary | ICD-10-CM | POA: Insufficient documentation

## 2023-12-27 DIAGNOSIS — G20C Parkinsonism, unspecified: Secondary | ICD-10-CM | POA: Insufficient documentation

## 2023-12-27 DIAGNOSIS — R404 Transient alteration of awareness: Secondary | ICD-10-CM | POA: Insufficient documentation

## 2023-12-27 LAB — URINALYSIS, ROUTINE W REFLEX MICROSCOPIC
Bilirubin Urine: NEGATIVE
Glucose, UA: NEGATIVE mg/dL
Hgb urine dipstick: NEGATIVE
Ketones, ur: NEGATIVE mg/dL
Nitrite: NEGATIVE
Protein, ur: NEGATIVE mg/dL
Specific Gravity, Urine: 1.016 (ref 1.005–1.030)
pH: 6.5 (ref 5.0–8.0)

## 2023-12-27 LAB — COMPREHENSIVE METABOLIC PANEL WITH GFR
ALT: <5 U/L (ref 0–44)
AST: 19 U/L (ref 15–41)
Albumin: 4.5 g/dL (ref 3.5–5.0)
Alkaline Phosphatase: 108 U/L (ref 38–126)
Anion gap: 11 (ref 5–15)
BUN: 21 mg/dL (ref 8–23)
CO2: 25 mmol/L (ref 22–32)
Calcium: 11 mg/dL — ABNORMAL HIGH (ref 8.9–10.3)
Chloride: 104 mmol/L (ref 98–111)
Creatinine, Ser: 0.79 mg/dL (ref 0.44–1.00)
GFR, Estimated: >60 mL/min (ref >60)
Glucose, Bld: 148 mg/dL — ABNORMAL HIGH (ref 70–99)
Potassium: 4.3 mmol/L (ref 3.5–5.1)
Sodium: 140 mmol/L (ref 135–145)
Total Bilirubin: 0.3 mg/dL (ref 0.0–1.2)
Total Protein: 7.3 g/dL (ref 6.5–8.1)

## 2023-12-27 LAB — CBC
HCT: 41.9 % (ref 36.0–46.0)
Hemoglobin: 13.7 g/dL (ref 12.0–15.0)
MCH: 31.1 pg (ref 26.0–34.0)
MCHC: 32.7 g/dL (ref 30.0–36.0)
MCV: 95.2 fL (ref 80.0–100.0)
Platelets: 228 K/uL (ref 150–400)
RBC: 4.4 MIL/uL (ref 3.87–5.11)
RDW: 13.9 % (ref 11.5–15.5)
WBC: 7.1 K/uL (ref 4.0–10.5)
nRBC: 0 % (ref 0.0–0.2)

## 2023-12-27 LAB — CBG MONITORING, ED: Glucose-Capillary: 143 mg/dL — ABNORMAL HIGH (ref 70–99)

## 2023-12-27 MED ORDER — CEPHALEXIN 500 MG PO CAPS: 500.0000 mg | ORAL_CAPSULE | Freq: Four times a day (QID) | ORAL | 0 refills | Status: DC

## 2023-12-27 MED ORDER — SODIUM CHLORIDE 0.9 % IV BOLUS
1000.0000 mL | Freq: Once | INTRAVENOUS | Status: AC
Start: 2023-12-27 — End: 2023-12-27
  Administered 2023-12-27: 1000 mL via INTRAVENOUS

## 2023-12-27 NOTE — ED Notes (Signed)
CBG 143  

## 2023-12-27 NOTE — ED Provider Notes (Signed)
 [place] EMERGENCY DEPARTMENT AT [facility] Provider Note   CSN: [id number] Arrival date & time: 12/27/23  1547     Patient presents with: Altered Mental Status   Jamie [name] is a 89 y.o. female.   Patient is here for evaluation of altered mental status that occurred this morning and possible dehydration.  Patient has a history of Parkinson's, Alzheimer's, and dementia.  She is accompanied by her daughter in law. This morning she had an episode where she was alert but not following directions or responding to her caregiver.  Patient was evaluated by EMS at that time who believed her symptoms to be related to dehydration and suspected UTI based on how the patient's depends smelled.  Daughter-in-law denies any recent vomiting, diarrhea, fevers for the patient and the patient agrees.  They deny any recent upper respiratory symptoms.  Patient states that she tries to drink more water but admits she does not always.  She denies any current dizziness.  Daughter-in-law and patient both deny any recent falls or head injury.  Patient has 24-hour caregivers in her home.  Daughter in law reports over the last 2 weeks the patient has had episodes while seated when she will nod off for about 10 seconds at a time and is easily arousable from the state.  These do not occur daily.  The main reason daughter-in-law brings this up is the patient was experiencing similar episodes prior to recent hospitalization for dehydration. Patient has a history of UTIs.  Abdominal include abdominal radius abdominal surgeries include patient was hospitalized from July 20 to July 22 of this year after coming in for evaluation of syncope.  Syncope was suspected to be hypovolemic in nature.  An echo was done at that time which showed normal EF, and no valvular abnormalities.  She was found to be mildly hypercalcemic with previous labs showing intermittent hypercalcemia in the past.    The history is provided by the  patient and a relative.  Altered Mental Status Presenting symptoms: confusion   Most recent episode:  Today Context: dementia   Context: not alcohol use, not drug use, not head injury, not homeless, taking medications as prescribed, not nursing home resident, not recent change in medication and not recent illness   Associated symptoms: no abdominal pain, no agitation, no difficulty breathing, no fever, no nausea, no palpitations, no seizures, no slurred speech and no vomiting        Prior to Admission medications   Medication Sig Start Date End Date Taking? Authorizing Provider  cephALEXin (KEFLEX) 500 MG capsule Take 1 capsule (500 mg total) by mouth 4 (four) times daily. 12/27/23  Yes [name], PA-C  carbidopa -levodopa  (SINEMET  CR) 50-200 MG tablet TAKE 1 TABLET BY MOUTH AT BEDTIME. 11/27/23   Tat, Asberry RAMAN, DO  carbidopa -levodopa  (SINEMET  IR) 25-100 MG tablet TAKE 2 TABLETS BY MOUTH 3 TIMES DAILY. 10/09/23   Tat, Asberry RAMAN, DO  Cholecalciferol (VITAMIN D -3 PO) Take 1 capsule by mouth daily after lunch.    [provider]  donepezil  (ARICEPT ) 10 MG tablet TAKE 1 TABLET (10 MG TOTAL) BY MOUTH AT BEDTIME. Patient taking differently: Take 10 mg by mouth daily before breakfast. 07/17/23   Tat, Asberry RAMAN, DO  letrozole  (FEMARA ) 2.5 MG tablet TAKE 1 TABLET (2.5 MG TOTAL) BY MOUTH DAILY. 10/30/23   Odean Potts, MD  mirtazapine  (REMERON ) 15 MG tablet TAKE 1 TABLET (15 MG TOTAL) BY MOUTH AT BEDTIME. 05/09/23   Chandra Toribio POUR,  MD  Multiple Vitamins-Minerals (PRESERVISION AREDS PO) Take 1 capsule by mouth See admin instructions. Take 1 capsule by mouth twice daily, 1 capsule after breakfast and 1 capsule after lunch.    [provider]  omeprazole  (PRILOSEC) 20 MG capsule TAKE 1 CAPSULE BY MOUTH DAILY AT 5:30 PM 10/30/23   Clapp, Kara F, PA-C  OVER THE COUNTER MEDICATION Take 2 tablets by mouth daily after lunch. [facility].    [provider]  OVER THE COUNTER  MEDICATION Take 2 tablets by mouth daily after breakfast. [facility] Cholesterol Reduction Complex    [provider]  OVER THE COUNTER MEDICATION Take 1 tablet by mouth daily after breakfast. [facility] OptiFlora    [provider]  OVER THE COUNTER MEDICATION Take 2 tablets by mouth at bedtime as needed (constipation). [facility] Herb-Lax    [provider]  rosuvastatin  (CRESTOR ) 5 MG tablet Take 1 tablet (5 mg total) by mouth at bedtime. 11/09/22   Wallace Search A, PA  VITAMIN E PO Take 1 capsule by mouth daily after lunch.    [provider]  donepezil  (ARICEPT ) 5 MG tablet Take 1 tablet (5 mg total) by mouth at bedtime. 05/04/22   Hanford Powell BRAVO, NP    Allergies: Flagyl  [metronidazole ]    Review of Systems  Constitutional:  Negative for fever.  Cardiovascular:  Negative for palpitations.  Gastrointestinal:  Negative for abdominal pain, nausea and vomiting.  Neurological:  Negative for seizures.  Psychiatric/Behavioral:  Positive for confusion. Negative for agitation.     Updated Vital Signs BP 133/87   Pulse 70   Temp 98.7 F (37.1 C) (Oral)   Resp 14   SpO2 100%   Physical Exam Vitals and nursing note reviewed.  Constitutional:      General: She is not in acute distress.    Appearance: Normal appearance. She is not ill-appearing.  HENT:     Head: Normocephalic and atraumatic.  Eyes:     Extraocular Movements: Extraocular movements intact.  Cardiovascular:     Rate and Rhythm: Normal rate and regular rhythm.  Pulmonary:     Effort: Pulmonary effort is normal. No respiratory distress.     Breath sounds: Normal breath sounds. No stridor. No wheezing, rhonchi or rales.  Abdominal:     General: Abdomen is flat. Bowel sounds are normal. There is no distension.     Palpations: Abdomen is soft.     Tenderness: There is no abdominal tenderness. There is no guarding.  Skin:    General: Skin is warm and dry.     Coloration: Skin is not  jaundiced or pale.  Neurological:     Mental Status: She is alert. Mental status is at baseline.     Comments: Knows who she and her daughter-in-law, that she is in the hospital, but does not know the year.  This is baseline per daughter-in-law.     (all labs ordered are listed, but only abnormal results are displayed) Labs Reviewed  COMPREHENSIVE METABOLIC PANEL WITH GFR - Abnormal; Notable for the following components:      Result Value   Glucose, Bld 148 (*)    Calcium  11.0 (*)    All other components within normal limits  URINALYSIS, ROUTINE W REFLEX MICROSCOPIC - Abnormal; Notable for the following components:   Leukocytes,Ua SMALL (*)    Bacteria, UA RARE (*)    All other components within normal limits  CBG MONITORING, ED - Abnormal; Notable for the following components:  Glucose-Capillary 143 (*)    All other components within normal limits  URINE CULTURE  CBC  CBG MONITORING, ED    EKG: EKG Interpretation Date/Time:  Thursday December 27 2023 16:04:12 EST Ventricular Rate:  80 PR Interval:  156 QRS Duration:  86 QT Interval:  368 QTC Calculation: 424 R Axis:   -40  Text Interpretation: Normal sinus rhythm Left axis deviation When compared with ECG of 09-Sep-2023 11:11, PREVIOUS ECG IS PRESENT Confirmed by Cottie Cough [phone]) on 12/27/2023 4:21:02 PM  Radiology: No results found.   Procedures   Medications Ordered in the ED  sodium chloride  0.9 % bolus 1,000 mL (1,000 mLs Intravenous New Bag/Given 12/27/23 1707)      Patient presents to the ED for concern of altered mental status, this involves an extensive number of treatment options, and is a complaint that carries with it a high risk of complications and morbidity.  The differential diagnosis includes dementia, stroke, psychiatric origin, sepsis, drug use, hypoglycemia, arrhythmia, UTI, neurosyphilis, carbon monoxide poisoning, hyperglycemia, alcohol, or brain tumor.   Co morbidities that complicate  the patient evaluation  Dementia, Parkinson's, Alzheimer's   Additional history obtained:  Additional history obtained from  Past Admission   External records from outside source obtained and reviewed including treatment course during our most recent hospitalization.   Lab Tests:  I Ordered, and personally interpreted labs.  The pertinent results include: Mild hypercalcemia. UA shows small leukocytes and rare bacteria.   Cardiac Monitoring:  EKG showing no obvious signs of MI or arrhythmias   Medicines ordered and prescription drug management:  I ordered medication including normal saline for suspected dehydration Reevaluation of the patient after these medicines showed that the patient stayed the same I have reviewed the patients home medicines and have made adjustments as needed   Problem List / ED Course:  Patient is here for evaluation of altered mental status.  EKG and blood work are unremarkable.  Vitals stable.    Reevaluation:  After the interventions noted above, I reevaluated the patient and found that they have :stable   Dispostion:  After consideration of the diagnostic results and the patients response to treatment, I feel that the patent would benefit from supportive care in the home setting and close follow-up with primary care physician.  Considering recent symptoms along with the urinalysis results she may have early onset of a UTI, with this in mind we have sent urine cultures and an outpatient antibiotic prescription.  Pharmacy will call the patient with her results and advise if she can discontinue this antibiotic or if antibiotic needs to be changed.  Daughter-in-law and patient are agreeable with this plan.   Clinical Course as of 12/27/23 1902  Thu Dec 27, 2023  1716 This is an 89 year old female with a history of dementia presenting from home in the company of her daughter-in-law with concern for generalized weakness.  They report that the patient  typically can get up with a walker but today was not willing to or able to stand up out of bed.  Her home health aide was concerned about the sudden change in her energy level, and advised to come into the ED for evaluation.  The patient tells me she has no acute complaints but is level 5 caveat due to dementia.  She has a noted history of Alzheimer's dementia.  She was also admitted for near syncope to the hospital 3 to 4 months ago with no emergent findings on an echocardiogram at  that time.  Currently she is asymptomatic without lightheadedness.  Vital signs are unremarkable.  Basic labs did not show any sign of AKI or anemia or leukocytosis.  Electrolytes are normal.  Glucose is normal.  She is pending a UA, she does have a history of UTIs.  If this is unremarkable I would anticipate a discharge home, she has 24-hour caregivers in the house.  Her EKG per my interpretation does not show any acute ischemia.  Her family is comfortable with this plan [MT]  1859 UA is unremarkable.  No clear evidence of infection.  I think the patient would be stable for close outpatient follow-up with her PCP but is not requiring hospitalization at this time.  Doubt PE, ACS, sepsis, or other life-threatening emergency. [MT]    Clinical Course User Index [MT] Cottie Donnice PARAS, MD                                 Medical Decision Making       Final diagnoses:  Transient alteration of awareness    ED Discharge Orders          Ordered    cephALEXin (KEFLEX) 500 MG capsule  4 times daily        12/27/23 1856               Rosina Almarie DELENA DEVONNA 12/27/23 1902    Cottie Donnice PARAS, MD 12/27/23 2312

## 2023-12-27 NOTE — Discharge Instructions (Addendum)
 It was a pleasure meeting with you today.     As we discussed urine results accompanied by today's symptoms may be indicative of an early UTI.  We have sent the urine for cultures and we will send you home with an antibiotic today.  Our pharmacist will call with the results of this urine culture and advise if you should discontinue the antibiotic or begin a new antibiotic.  Please return if you have any concerns, develop symptoms concerning for infection, or experience loss of consciousness.

## 2023-12-27 NOTE — ED Triage Notes (Signed)
 Daughter reports patient has been having black outs over the last 2 days w/ increasing AMS. Reports increased urinary frequency.   Hx of parkinson's. A&Ox2 which is patients baseline.

## 2023-12-28 LAB — URINE CULTURE

## 2024-01-03 ENCOUNTER — Ambulatory Visit

## 2024-01-03 DIAGNOSIS — Z0000 Encounter for general adult medical examination without abnormal findings: Secondary | ICD-10-CM | POA: Diagnosis not present

## 2024-01-03 NOTE — Patient Instructions (Signed)
 Jamie [name],  Thank you for taking the time for your Medicare Wellness Visit. I appreciate your continued commitment to your health goals. Please review the care plan we discussed, and feel free to reach out if I can assist you further.  Please note that Annual Wellness Visits do not include a physical exam. Some assessments may be limited, especially if the visit was conducted virtually. If needed, we may recommend an in-person follow-up with your provider.  Ongoing Care Seeing your primary care provider every 3 to 6 months helps us  monitor your health and provide consistent, personalized care.   Referrals If a referral was made during today's visit and you haven't received any updates within two weeks, please contact the referred provider directly to check on the status.  Recommended Screenings:  Health Maintenance  Topic Date Due   Eye exam for diabetics  Never done   Medicare Annual Wellness Visit  09/07/2023   Flu Shot  09/21/2023   COVID-19 Vaccine (4 - 2025-26 season) 10/22/2023   DTaP/Tdap/Td vaccine (2 - Td or Tdap) 08/08/2024*   Hemoglobin A1C  02/22/2024   Complete foot exam   04/04/2024   Breast Cancer Screening  10/31/2024   Pneumococcal Vaccine for age over 24  Completed   DEXA scan (bone density measurement)  Completed   Zoster (Shingles) Vaccine  Completed   Meningitis B Vaccine  Aged Out  *Topic was postponed. The date shown is not the original due date.       01/03/2024    3:03 PM  Advanced Directives  Does Patient Have a Medical Advance Directive? Yes  Type of Estate Agent of [place];Living will  Does patient want to make changes to medical advance directive? No - Patient declined  Copy of Healthcare Power of Attorney in Chart? No - copy requested    Vision: Annual vision screenings are recommended for early detection of glaucoma, cataracts, and diabetic retinopathy. These exams can also reveal signs of chronic conditions such as  diabetes and high blood pressure.  Dental: Annual dental screenings help detect early signs of oral cancer, gum disease, and other conditions linked to overall health, including heart disease and diabetes.  Please see the attached documents for additional preventive care recommendations.

## 2024-01-03 NOTE — Progress Notes (Signed)
 Chief Complaint  Patient presents with   Medicare Wellness     Subjective:   Jamie [name] is a 89 y.o. female who presents for a Medicare Annual Wellness Visit.  Allergies (verified) Flagyl  [metronidazole ]   History: Past Medical History:  Diagnosis Date   Abnormality of gait 04/18/2017   Breast cancer (HCC)    Left breast mastectomy.   Degenerative joint disease of knee, right 03/19/2018   Injected April 18, 2018 Approved for Durolane R knee 3.4.20. Patient given another steroid injection Jun 24, 2018.  Repeat injection September 09, 2018 repeat injection February 27, 2019 repeat 6/110/20221 bilateral injections given December 01, 2019 Right knee injected April 20, 2020 monovisc May 19, 2018 Repeat steroid injection given June 16, 2020 repeat injection August 24, 2020   Essential hypertension 08/14/2016   Family history of breast cancer 07/14/2021   Generalized anxiety disorder 06/27/2017   History of dizziness 06/27/2017   History of hysterectomy for benign disease 06/27/2017   History of vitamin D  deficiency 08/22/2017   Leukocytes in urine 08/14/2016   Lumbar facet arthropathy 06/27/2017   Major neurocognitive disorder due to multiple etiologies 09/29/2020   Mixed hyperlipidemia 08/22/2017   Osteoarthritis of knee 09/18/2017   Pancolitis 08/14/2016   Parkinson's disease (HCC) 03/13/2017   Personal history of chemotherapy    Prediabetes 08/22/2017   Right knee pain    Spinal stenosis of lumbar region    Weakness of both hips 04/18/2017   White coat syndrome without diagnosis of hypertension 08/22/2017   Past Surgical History:  Procedure Laterality Date   BREAST BIOPSY Left    06/2021   HYSTERECTOMY ABDOMINAL WITH SALPINGECTOMY     MASTECTOMY W/ SENTINEL NODE BIOPSY Left 08/18/2021   Procedure: LEFT MASTECTOMY WITH TARGETED LYMPH NODE DISSECTION;  Surgeon: Vernetta Berg, MD;  Location: [facility];  Service: General;  Laterality: Left;   TONSILLECTOMY      TOTAL ABDOMINAL HYSTERECTOMY W/ BILATERAL SALPINGOOPHORECTOMY     Family History  Problem Relation Age of Onset   Breast cancer Mother 36   Melanoma Father        dx. 75s   Lung cancer Father        dx. 85s   Healthy Son    Social History   Occupational History   Not on file  Tobacco Use   Smoking status: Never    Passive exposure: Never   Smokeless tobacco: Never  Vaping Use   Vaping status: Never Used  Substance and Sexual Activity   Alcohol use: No    Alcohol/week: 0.0 standard drinks of alcohol   Drug use: No   Sexual activity: Not Currently    Birth control/protection: Surgical   Tobacco Counseling Counseling given: Not Answered  SDOH Screenings   Food Insecurity: No Food Insecurity (01/03/2024)  Housing: Unknown (01/03/2024)  Transportation Needs: No Transportation Needs (01/03/2024)  Utilities: Not At Risk (01/03/2024)  Alcohol Screen: Low Risk  (01/03/2024)  Depression (PHQ2-9): Low Risk  (01/03/2024)  Financial Resource Strain: Low Risk  (01/03/2024)  Physical Activity: Insufficiently Active (01/03/2024)  Social Connections: Socially Isolated (01/03/2024)  Stress: No Stress Concern Present (01/03/2024)  Tobacco Use: Low Risk  (01/03/2024)  Health Literacy: Adequate Health Literacy (01/03/2024)   See flowsheets for full screening details  Depression Screen PHQ 2 & 9 Depression Scale- Over the past 2 weeks, how often have you been bothered by any of the following problems? Little interest or pleasure in doing things: 0 Feeling down,  depressed, or hopeless (PHQ Adolescent also includes...irritable): 0 PHQ-2 Total Score: 0 Trouble falling or staying asleep, or sleeping too much: 0 Feeling tired or having little energy: 1 Poor appetite or overeating (PHQ Adolescent also includes...weight loss): 0 Feeling bad about yourself - or that you are a failure or have let yourself or your family down: 0 Trouble concentrating on things, such as reading the  newspaper or watching television (PHQ Adolescent also includes...like school work): 1 Moving or speaking so slowly that other people could have noticed. Or the opposite - being so fidgety or restless that you have been moving around a lot more than usual: 0 Thoughts that you would be better off dead, or of hurting yourself in some way: 0 PHQ-9 Total Score: 2 If you checked off any problems, how difficult have these problems made it for you to do your work, take care of things at home, or get along with other people?: Not difficult at all  Depression Treatment Depression Interventions/Treatment : EYV7-0 Score <4 Follow-up Not Indicated     Goals Addressed             This Visit's Progress    Patient Stated       01/03/2024, wants to get stronger       Visit info / Clinical Intake: Medicare Wellness Visit Type:: Subsequent Annual Wellness Visit Persons participating in visit:: patient & caregiver Medicare Wellness Visit Mode:: Telephone If telephone:: video declined Because this visit was a virtual/telehealth visit:: unable to obtan vitals due to lack of equipment If Telephone or Video please confirm:: I connected with the patient using audio enabled telemedicine application and verified that I am speaking with the correct person using two identifiers; I discussed the limitations of evaluation and management by telemedicine; The patient expressed understanding and agreed to proceed Patient Location:: home Provider Location:: home office Information given by:: patient; family Interpreter Needed?: No Pre-visit prep was completed: yes AWV questionnaire completed by patient prior to visit?: no Living arrangements:: with family/others Patient's Overall Health Status Rating: good Typical amount of pain: none Does pain affect daily life?: no Are you currently prescribed opioids?: no  Dietary Habits and Nutritional Risks How many meals a day?: 3 Eats fruit and vegetables daily?:  yes Most meals are obtained by: preparing own meals In the last 2 weeks, have you had any of the following?: none Diabetic:: no  Functional Status Activities of Daily Living (to include ambulation/medication): (!) Needs Assist Feeding: Independent Dressing/Grooming: Needs assistance Bathing: Needs assistance Toileting: Needs assistance Transfer: Independent Ambulation: Independent with device- listed below Home Assistive Devices/Equipment: Cane Medication Administration: Dependent Home Management: Dependent Manage your own finances?: (!) no Primary transportation is: family/friends Concerns about vision?: no *vision screening is required for WTM* Concerns about hearing?: no  Fall Screening Falls in the past year?: 1 (knee gave out on stairs) Number of falls in past year: 0 Was there an injury with Fall?: 0 Fall Risk Category Calculator: 1 Patient Fall Risk Level: Low Fall Risk  Fall Risk Patient at Risk for Falls Due to: Impaired mobility; Medication side effect Fall risk Follow up: Falls evaluation completed; Falls prevention discussed  Home and Transportation Safety: All rugs have non-skid backing?: N/A, no rugs All stairs or steps have railings?: yes Grab bars in the bathtub or shower?: yes Have non-skid surface in bathtub or shower?: yes Good home lighting?: yes Regular seat belt use?: yes Hospital stays in the last year:: (!) yes How many hospital stays:: 1  Reason: dehydration  Cognitive Assessment Difficulty concentrating, remembering, or making decisions? : yes Will 6CIT or Mini Cog be Completed: no 6CIT or Mini Cog Declined: patient has a diagnosis of dementia or cognitive impairment  Advance Directives (For Healthcare) Does Patient Have a Medical Advance Directive?: Yes Does patient want to make changes to medical advance directive?: No - Patient declined Type of Advance Directive: Healthcare Power of [place]; Living will Copy of Healthcare Power of  Attorney in Chart?: No - copy requested Copy of Living Will in Chart?: No - copy requested Would patient like information on creating a medical advance directive?: No - Patient declined  Reviewed/Updated  Reviewed/Updated: Reviewed All (Medical, Surgical, Family, Medications, Allergies, Care Teams, Patient Goals)        Objective:    Today's Vitals   There is no height or weight on file to calculate BMI.  Current Medications (verified) Outpatient Encounter Medications as of 01/03/2024  Medication Sig   carbidopa -levodopa  (SINEMET  CR) 50-200 MG tablet TAKE 1 TABLET BY MOUTH AT BEDTIME.   carbidopa -levodopa  (SINEMET  IR) 25-100 MG tablet TAKE 2 TABLETS BY MOUTH 3 TIMES DAILY.   Cholecalciferol (VITAMIN D -3 PO) Take 1 capsule by mouth daily after lunch.   donepezil  (ARICEPT ) 10 MG tablet TAKE 1 TABLET (10 MG TOTAL) BY MOUTH AT BEDTIME. (Patient taking differently: Take 10 mg by mouth daily before breakfast.)   letrozole  (FEMARA ) 2.5 MG tablet TAKE 1 TABLET (2.5 MG TOTAL) BY MOUTH DAILY.   mirtazapine  (REMERON ) 15 MG tablet TAKE 1 TABLET (15 MG TOTAL) BY MOUTH AT BEDTIME.   Multiple Vitamins-Minerals (PRESERVISION AREDS PO) Take 1 capsule by mouth See admin instructions. Take 1 capsule by mouth twice daily, 1 capsule after breakfast and 1 capsule after lunch.   omeprazole  (PRILOSEC) 20 MG capsule TAKE 1 CAPSULE BY MOUTH DAILY AT 5:30 PM   OVER THE COUNTER MEDICATION Take 2 tablets by mouth daily after lunch. Joint Health.   OVER THE COUNTER MEDICATION Take 2 tablets by mouth daily after breakfast. Shaklee Cholesterol Reduction Complex   OVER THE COUNTER MEDICATION Take 1 tablet by mouth daily after breakfast. Shaklee OptiFlora   OVER THE COUNTER MEDICATION Take 2 tablets by mouth at bedtime as needed (constipation). Shaklee Herb-Lax   rosuvastatin  (CRESTOR ) 5 MG tablet Take 1 tablet (5 mg total) by mouth at bedtime.   VITAMIN E PO Take 1 capsule by mouth daily after lunch.    cephALEXin (KEFLEX) 500 MG capsule Take 1 capsule (500 mg total) by mouth 4 (four) times daily. (Patient not taking: Reported on 01/03/2024)   [DISCONTINUED] donepezil  (ARICEPT ) 5 MG tablet Take 1 tablet (5 mg total) by mouth at bedtime.   No facility-administered encounter medications on file as of 01/03/2024.   Hearing/Vision screen Hearing Screening - Comments:: [place] hearing issues Vision Screening - Comments:: Regular eye exams, [place] [facility] Immunizations and Health Maintenance Health Maintenance  Topic Date Due   OPHTHALMOLOGY EXAM  Never done   Influenza Vaccine  09/21/2023   COVID-19 Vaccine (4 - 2025-26 season) 10/22/2023   DTaP/Tdap/Td (2 - Td or Tdap) 08/08/2024 (Originally 07/10/2023)   HEMOGLOBIN A1C  02/22/2024   FOOT EXAM  04/04/2024   Mammogram  10/31/2024   Medicare Annual Wellness (AWV)  01/02/2025   Pneumococcal Vaccine: 50+ Years  Completed   DEXA SCAN  Completed   Zoster Vaccines- Shingrix  Completed   Meningococcal B Vaccine  Aged Out        Assessment/Plan:  This is a routine  wellness examination for [place].  Patient Care Team: Gayle Saddie [name] DEVONNA as PCP - General (Physician Assistant) Blanca Elsie RAMAN, MD as Consulting Physician (Cardiology) Court Pulling, MD as Referring Physician (Dermatology) Rehabilitation, [place] Tat, Asberry RAMAN, DO as Consulting Physician (Neurology) Tyree Nanetta SAILOR, RN as Oncology Nurse Navigator Vernetta Berg, MD as Consulting Physician (General Surgery) Odean Potts, MD as Consulting Physician (Hematology and Oncology) Izell Domino, MD as Attending Physician (Radiation Oncology)  I have personally reviewed and noted the following in the patient's chart:   Medical and social history Use of alcohol, tobacco or illicit drugs  Current medications and supplements including opioid prescriptions. Functional ability and status Nutritional status Physical activity Advanced directives List of other  physicians Hospitalizations, surgeries, and ER visits in previous 12 months Vitals Screenings to include cognitive, depression, and falls Referrals and appointments  No orders of the defined types were placed in this encounter.  In addition, I have reviewed and discussed with patient certain preventive protocols, quality metrics, and best practice recommendations. A written personalized care plan for preventive services as well as general preventive health recommendations were provided to patient.   Ardella FORBES Dawn, LPN   [id number]/7974   Return in 1 year (on 01/02/2025).  After Visit Summary: (MyChart) Due to this being a telephonic visit, the after visit summary with patients personalized plan was offered to patient via MyChart   Nurse Notes: none

## 2024-01-08 ENCOUNTER — Other Ambulatory Visit: Payer: Self-pay | Admitting: Neurology

## 2024-01-08 DIAGNOSIS — G20A1 Parkinson's disease without dyskinesia, without mention of fluctuations: Secondary | ICD-10-CM

## 2024-01-21 ENCOUNTER — Emergency Department (HOSPITAL_BASED_OUTPATIENT_CLINIC_OR_DEPARTMENT_OTHER)
Admission: EM | Admit: 2024-01-21 | Discharge: 2024-01-21 | Disposition: A | Discharge status: Home / Self Care | Attending: Emergency Medicine | Admitting: Emergency Medicine

## 2024-01-21 ENCOUNTER — Encounter (HOSPITAL_BASED_OUTPATIENT_CLINIC_OR_DEPARTMENT_OTHER): Payer: Self-pay

## 2024-01-21 ENCOUNTER — Emergency Department (HOSPITAL_BASED_OUTPATIENT_CLINIC_OR_DEPARTMENT_OTHER)

## 2024-01-21 ENCOUNTER — Other Ambulatory Visit: Payer: Self-pay

## 2024-01-21 ENCOUNTER — Emergency Department (HOSPITAL_BASED_OUTPATIENT_CLINIC_OR_DEPARTMENT_OTHER): Admitting: Radiology

## 2024-01-21 DIAGNOSIS — R4182 Altered mental status, unspecified: Secondary | ICD-10-CM | POA: Diagnosis not present

## 2024-01-21 DIAGNOSIS — G20C Parkinsonism, unspecified: Secondary | ICD-10-CM | POA: Diagnosis not present

## 2024-01-21 DIAGNOSIS — R55 Syncope and collapse: Secondary | ICD-10-CM | POA: Insufficient documentation

## 2024-01-21 DIAGNOSIS — G9389 Other specified disorders of brain: Secondary | ICD-10-CM | POA: Diagnosis not present

## 2024-01-21 DIAGNOSIS — B999 Unspecified infectious disease: Secondary | ICD-10-CM | POA: Diagnosis not present

## 2024-01-21 DIAGNOSIS — F0280 Dementia in other diseases classified elsewhere without behavioral disturbance: Secondary | ICD-10-CM | POA: Insufficient documentation

## 2024-01-21 LAB — CBC WITH DIFFERENTIAL/PLATELET
Abs Immature Granulocytes: 0.03 K/uL (ref 0.00–0.07)
Basophils Absolute: 0.1 K/uL (ref 0.0–0.1)
Basophils Relative: 1 %
Eosinophils Absolute: 0.2 K/uL (ref 0.0–0.5)
Eosinophils Relative: 3 %
HCT: 40.9 % (ref 36.0–46.0)
Hemoglobin: 13.3 g/dL (ref 12.0–15.0)
Immature Granulocytes: 0 %
Lymphocytes Relative: 22 %
Lymphs Abs: 1.7 K/uL (ref 0.7–4.0)
MCH: 30.9 pg (ref 26.0–34.0)
MCHC: 32.5 g/dL (ref 30.0–36.0)
MCV: 95.1 fL (ref 80.0–100.0)
Monocytes Absolute: 1 K/uL (ref 0.1–1.0)
Monocytes Relative: 13 %
Neutro Abs: 4.6 K/uL (ref 1.7–7.7)
Neutrophils Relative %: 61 %
Platelets: 226 K/uL (ref 150–400)
RBC: 4.3 MIL/uL (ref 3.87–5.11)
RDW: 13.7 % (ref 11.5–15.5)
WBC: 7.5 K/uL (ref 4.0–10.5)
nRBC: 0 % (ref 0.0–0.2)

## 2024-01-21 LAB — URINALYSIS, W/ REFLEX TO CULTURE (INFECTION SUSPECTED)
Bacteria, UA: NONE SEEN
Bilirubin Urine: NEGATIVE
Glucose, UA: NEGATIVE mg/dL
Hgb urine dipstick: NEGATIVE
Ketones, ur: NEGATIVE mg/dL
Leukocytes,Ua: NEGATIVE
Nitrite: NEGATIVE
Protein, ur: NEGATIVE mg/dL
Specific Gravity, Urine: 1.022 (ref 1.005–1.030)
pH: 6.5 (ref 5.0–8.0)

## 2024-01-21 LAB — COMPREHENSIVE METABOLIC PANEL WITH GFR
ALT: <5 U/L (ref 0–44)
AST: 22 U/L (ref 15–41)
Albumin: 4.7 g/dL (ref 3.5–5.0)
Alkaline Phosphatase: 107 U/L (ref 38–126)
Anion gap: 10 (ref 5–15)
BUN: 30 mg/dL — ABNORMAL HIGH (ref 8–23)
CO2: 26 mmol/L (ref 22–32)
Calcium: 11.4 mg/dL — ABNORMAL HIGH (ref 8.9–10.3)
Chloride: 104 mmol/L (ref 98–111)
Creatinine, Ser: 0.83 mg/dL (ref 0.44–1.00)
GFR, Estimated: >60 mL/min (ref >60)
Glucose, Bld: 130 mg/dL — ABNORMAL HIGH (ref 70–99)
Potassium: 4.3 mmol/L (ref 3.5–5.1)
Sodium: 140 mmol/L (ref 135–145)
Total Bilirubin: 0.4 mg/dL (ref 0.0–1.2)
Total Protein: 7.4 g/dL (ref 6.5–8.1)

## 2024-01-21 LAB — TROPONIN T, HIGH SENSITIVITY: Troponin T High Sensitivity: <15 ng/L (ref 0–19)

## 2024-01-21 LAB — RESP PANEL BY RT-PCR (RSV, FLU A&B, COVID)  RVPGX2
Influenza A by PCR: NEGATIVE
Influenza B by PCR: NEGATIVE
Resp Syncytial Virus by PCR: NEGATIVE
SARS Coronavirus 2 by RT PCR: NEGATIVE

## 2024-01-21 LAB — LACTIC ACID, PLASMA: Lactic Acid, Venous: 1.6 mmol/L (ref 0.5–1.9)

## 2024-01-21 NOTE — ED Notes (Signed)
 Patient in bathroom for urine specimen and missed the hat.  No specimen collected at this time.  Will try again when patient needs to void.

## 2024-01-21 NOTE — ED Notes (Signed)
 Provider cleared DC V/S.SABRASABRA

## 2024-01-21 NOTE — ED Notes (Signed)
 DC paperwork given and verbally understood.

## 2024-01-21 NOTE — ED Notes (Signed)
 In and Out performed... Confirmed with the MD on need... RN performed and Medic witnessed.SABRASABRA

## 2024-01-21 NOTE — ED Provider Notes (Signed)
 [facility] EMERGENCY DEPARTMENT AT [facility] Provider Note   CSN: [id number] Arrival date & time: 01/21/24  1442     Patient presents with: Loss of Consciousness   Jamie [name] is a 89 y.o. female w/ parkinson's and some dementia, here for confusion and LOC.  Grand-daughter at bedside reports patient ate a hearty breakfast. Around lunchtime while eating she became briefly unresponsive, slumped in her chair. She revived and then it happened 2 more times, each lasting about a minute. No shaking episodes.  Pt now awake but seems confused. Was behaving confused earlier today.  She has dementia issues but her memory is much curtailed now and she is using wrong words.  She acted this way about 3 weeks ago when she was in ED diagnosed with UTI, and got better on antibiotics w/ Keflex .  Admitted for syncope in July 2025 with no emergent findings on echocardiogram.  Urine cx 12/27/23 was multiple mixed species   HPI     Prior to Admission medications   Medication Sig Start Date End Date Taking? Authorizing Provider  carbidopa -levodopa  (SINEMET  CR) 50-200 MG tablet TAKE 1 TABLET BY MOUTH AT BEDTIME. 11/27/23   Tat, Asberry RAMAN, DO  carbidopa -levodopa  (SINEMET  IR) 25-100 MG tablet TAKE 2 TABLETS BY MOUTH 3 TIMES DAILY. 01/08/24   Tat, Asberry RAMAN, DO  cephALEXin  (KEFLEX ) 500 MG capsule Take 1 capsule (500 mg total) by mouth 4 (four) times daily. Patient not taking: Reported on 01/03/2024 12/27/23   [name], PA-C  Cholecalciferol (VITAMIN D -3 PO) Take 1 capsule by mouth daily after lunch.    [provider]  donepezil  (ARICEPT ) 10 MG tablet TAKE 1 TABLET (10 MG TOTAL) BY MOUTH AT BEDTIME. Patient taking differently: Take 10 mg by mouth daily before breakfast. 07/17/23   Tat, Asberry RAMAN, DO  letrozole  (FEMARA ) 2.5 MG tablet TAKE 1 TABLET (2.5 MG TOTAL) BY MOUTH DAILY. 10/30/23   Odean Potts, MD  mirtazapine  (REMERON ) 15 MG tablet TAKE 1 TABLET (15 MG TOTAL) BY MOUTH AT  BEDTIME. 05/09/23   Chandra Toribio POUR, MD  Multiple Vitamins-Minerals (PRESERVISION AREDS PO) Take 1 capsule by mouth See admin instructions. Take 1 capsule by mouth twice daily, 1 capsule after breakfast and 1 capsule after lunch.    [provider]  omeprazole  (PRILOSEC) 20 MG capsule TAKE 1 CAPSULE BY MOUTH DAILY AT 5:30 PM 10/30/23   Clapp, Kara F, PA-C  OVER THE COUNTER MEDICATION Take 2 tablets by mouth daily after lunch. Joint Health.    [provider]  OVER THE COUNTER MEDICATION Take 2 tablets by mouth daily after breakfast. Shaklee Cholesterol Reduction Complex    [provider]  OVER THE COUNTER MEDICATION Take 1 tablet by mouth daily after breakfast. Shaklee OptiFlora    [provider]  OVER THE COUNTER MEDICATION Take 2 tablets by mouth at bedtime as needed (constipation). Shaklee Herb-Lax    [provider]  rosuvastatin  (CRESTOR ) 5 MG tablet Take 1 tablet (5 mg total) by mouth at bedtime. 11/09/22   Wallace Search A, PA  VITAMIN E PO Take 1 capsule by mouth daily after lunch.    [provider]  donepezil  (ARICEPT ) 5 MG tablet Take 1 tablet (5 mg total) by mouth at bedtime. 05/04/22   Hanford Powell BRAVO, NP    Allergies: Flagyl  [metronidazole ]    Review of Systems  Updated Vital Signs BP (!) 161/97   Pulse 74   Temp 97.8 F (36.6 C)  Resp 15   SpO2 98%   Physical Exam Constitutional:      General: She is not in acute distress. HENT:     Head: Normocephalic and atraumatic.  Eyes:     Conjunctiva/sclera: Conjunctivae normal.     Pupils: Pupils are equal, round, and reactive to light.  Cardiovascular:     Rate and Rhythm: Normal rate and regular rhythm.  Pulmonary:     Effort: Pulmonary effort is normal. No respiratory distress.  Abdominal:     General: There is no distension.     Tenderness: There is no abdominal tenderness.  Skin:    General: Skin is warm and dry.  Neurological:     General: No focal  deficit present.     Mental Status: She is alert. Mental status is at baseline.     Sensory: No sensory deficit.     Motor: No weakness.     Comments: Oriented to self only     (all labs ordered are listed, but only abnormal results are displayed) Labs Reviewed  COMPREHENSIVE METABOLIC PANEL WITH GFR - Abnormal; Notable for the following components:      Result Value   Glucose, Bld 130 (*)    BUN 30 (*)    Calcium  11.4 (*)    All other components within normal limits  URINALYSIS, W/ REFLEX TO CULTURE (INFECTION SUSPECTED) - Abnormal; Notable for the following components:   APPearance HAZY (*)    Crystals PRESENT (*)    All other components within normal limits  RESP PANEL BY RT-PCR (RSV, FLU A&B, COVID)  [id number]  LACTIC ACID, PLASMA  CBC WITH DIFFERENTIAL/PLATELET  TROPONIN T, HIGH SENSITIVITY    EKG: EKG Interpretation Date/Time:  Monday January 21 2024 14:53:51 EST Ventricular Rate:  82 PR Interval:  156 QRS Duration:  82 QT Interval:  356 QTC Calculation: 415 R Axis:   -43  Text Interpretation: Normal sinus rhythm Left axis deviation Moderate voltage criteria for LVH, may be normal variant ( R in aVL , Cornell product ) When compared with ECG of 27-Dec-2023 16:04, Septal infarct is now Present Confirmed by Cottie Cough [phone]) on 01/21/2024 2:58:01 PM  Radiology: CT Head Wo Contrast Result Date: 01/21/2024 CLINICAL DATA:  Syncope and altered mental status. EXAM: CT HEAD WITHOUT CONTRAST TECHNIQUE: Contiguous axial images were obtained from the base of the skull through the vertex without intravenous contrast. RADIATION DOSE REDUCTION: This exam was performed according to the departmental dose-optimization program which includes automated exposure control, adjustment of the mA and/or kV according to patient size and/or use of iterative reconstruction technique. COMPARISON:  None Available. FINDINGS: Brain: There is generalized cerebral atrophy with widening of the  extra-axial spaces and ventricular dilatation. There are areas of decreased attenuation within the white matter tracts of the supratentorial brain, consistent with microvascular disease changes. Vascular: No hyperdense vessel or unexpected calcification. Skull: Normal. Negative for fracture or focal lesion. Sinuses/Orbits: There is mild left maxillary sinus mucosal thickening. Other: None. IMPRESSION: 1. Generalized cerebral atrophy and microvascular disease changes of the supratentorial brain. 2. No acute intracranial abnormality. 3. Mild left maxillary sinus disease. Electronically Signed   By: Suzen Dials M.D.   On: 01/21/2024 18:03   DG Chest 2 View Result Date: 01/21/2024 CLINICAL DATA:  Infection EXAM: DG CHEST 2V COMPARISON:  None Available. FINDINGS: The heart size and mediastinal contours are within normal limits. Both lungs are clear. The visualized skeletal structures are unremarkable. IMPRESSION: No active cardiopulmonary disease. Electronically Signed  By: Greig Pique M.D.   On: 01/21/2024 16:11     Procedures   Medications Ordered in the ED - No data to display                                  Medical Decision Making Amount and/or Complexity of Data Reviewed Labs: ordered. Radiology: ordered.   This patient presents to the Emergency Department with complaint of altered mental status.  This involves an extensive number of treatment options, and is a complaint that carries with it a high risk of complications and morbidity.  The differential diagnosis includes hypoglycemia vs metabolic encephalopathy vs infection (including cystitis) vs ICH vs stroke vs polypharmacy vs other  Viral illness including influenza and COVID-19 are also on the differential for the patient's symptoms  I ordered, reviewed, and interpreted labs, including WBC, lactic acid wnl. Hgb wnlk. Cr normal. Ca elevated 11.4. K wnl. Trop negative.  Covid and UA negative I ordered imaging studies which  included CT head, dg chest  I independently visualized and interpreted imaging which showed no emergent finding and the monitor tracing which showed sinus Additional history was obtained from family at bedside Previous records obtained and reviewed showing last urine cx, inpatient echo July 2025 I personally reviewed the patients ECG which showed sinus rhythm with no acute ischemic findings  Patient's workup has been largely unremarkable in the ED.  She does have some mild hypertension, but I do not believe is contributing to her symptoms.  I do not see clear convincing evidence of stroke, sepsis, urine infection, viral infection, or any other emergency findings.  I discussed the option of observation hospitalization with her family member at the bedside, her daughter.  However, upon further clarification, her daughter reports patient has good days and bad days with her Parkinson's dementia.  It is quite possible that this cognitive change today was simply a bad day her down day.  They would like to avoid hospitalization if possible, and I think this is quite reasonable.  Given that she has also had a recent syncope evaluation with echocardiogram, no significant structural heart disease, no arrhythmia on her telemetry today, I do not believe that she is requiring further cardiac workup for near syncope.  These may have been vagal spells that she had earlier today.  They will follow-up with her PCP.  They are comfortable taking her home     Final diagnoses:  Near syncope    ED Discharge Orders     None          Rowdy Guerrini, Donnice PARAS, MD 01/21/24 1816

## 2024-01-21 NOTE — Discharge Instructions (Addendum)
 Please follow-up with your primary care clinic and also the neurologist next month.  It is common to have days of on and off confusion with Parkinson's and dementia.  However, if there is severe or abrupt changes in confusion that is lasting longer than normal, or you notice any strokelike symptoms including difficulty with speech, or weakness or inability to use one side of the body, please return immediately to the hospital.

## 2024-01-21 NOTE — ED Triage Notes (Signed)
 Pt w grandchild, she has passed out x3 in . Advises weakness, confused/ not acting herself, x3 days. Advises she just got over UTI about 2.5wks ago, she acted like this but no fainting.

## 2024-01-24 ENCOUNTER — Other Ambulatory Visit: Payer: Self-pay

## 2024-01-24 DIAGNOSIS — E119 Type 2 diabetes mellitus without complications: Secondary | ICD-10-CM

## 2024-01-24 DIAGNOSIS — E782 Mixed hyperlipidemia: Secondary | ICD-10-CM

## 2024-01-24 DIAGNOSIS — E039 Hypothyroidism, unspecified: Secondary | ICD-10-CM

## 2024-02-01 ENCOUNTER — Other Ambulatory Visit: Payer: Self-pay | Admitting: Family Medicine

## 2024-02-01 ENCOUNTER — Other Ambulatory Visit

## 2024-02-01 DIAGNOSIS — F330 Major depressive disorder, recurrent, mild: Secondary | ICD-10-CM

## 2024-02-01 DIAGNOSIS — E782 Mixed hyperlipidemia: Secondary | ICD-10-CM

## 2024-02-01 DIAGNOSIS — E039 Hypothyroidism, unspecified: Secondary | ICD-10-CM

## 2024-02-01 DIAGNOSIS — E119 Type 2 diabetes mellitus without complications: Secondary | ICD-10-CM

## 2024-02-01 DIAGNOSIS — E1169 Type 2 diabetes mellitus with other specified complication: Secondary | ICD-10-CM

## 2024-02-02 LAB — LIPID PANEL
Chol/HDL Ratio: 3.1 ratio (ref 0.0–4.4)
Cholesterol, Total: 192 mg/dL (ref 100–199)
HDL: 62 mg/dL (ref >39)
LDL Chol Calc (NIH): 103 mg/dL — ABNORMAL HIGH (ref 0–99)
Triglycerides: 156 mg/dL — ABNORMAL HIGH (ref 0–149)
VLDL Cholesterol Cal: 27 mg/dL (ref 5–40)

## 2024-02-02 LAB — THYROID PANEL WITH TSH
Free Thyroxine Index: 1.8 (ref 1.2–4.9)
T3 Uptake Ratio: 31 % (ref 24–39)
T4, Total: 5.7 ug/dL (ref 4.5–12.0)
TSH: 5.83 u[IU]/mL — ABNORMAL HIGH (ref 0.450–4.500)

## 2024-02-02 LAB — HEMOGLOBIN A1C
Est. average glucose Bld gHb Est-mCnc: 148 mg/dL
Hgb A1c MFr Bld: 6.8 % — ABNORMAL HIGH (ref 4.8–5.6)

## 2024-02-03 ENCOUNTER — Ambulatory Visit: Payer: Self-pay

## 2024-02-08 ENCOUNTER — Ambulatory Visit

## 2024-02-08 VITALS — BP 138/76 | HR 81 | Temp 97.4°F | Ht 68.0 in | Wt 203.1 lb

## 2024-02-08 DIAGNOSIS — Z23 Encounter for immunization: Secondary | ICD-10-CM

## 2024-02-08 DIAGNOSIS — Z780 Asymptomatic menopausal state: Secondary | ICD-10-CM | POA: Diagnosis not present

## 2024-02-08 DIAGNOSIS — E039 Hypothyroidism, unspecified: Secondary | ICD-10-CM

## 2024-02-08 DIAGNOSIS — Z170 Estrogen receptor positive status [ER+]: Secondary | ICD-10-CM | POA: Diagnosis not present

## 2024-02-08 DIAGNOSIS — E119 Type 2 diabetes mellitus without complications: Secondary | ICD-10-CM

## 2024-02-08 DIAGNOSIS — R030 Elevated blood-pressure reading, without diagnosis of hypertension: Secondary | ICD-10-CM

## 2024-02-08 DIAGNOSIS — E1169 Type 2 diabetes mellitus with other specified complication: Secondary | ICD-10-CM

## 2024-02-08 DIAGNOSIS — E782 Mixed hyperlipidemia: Secondary | ICD-10-CM | POA: Diagnosis not present

## 2024-02-08 DIAGNOSIS — G20B1 Parkinson's disease with dyskinesia, without mention of fluctuations: Secondary | ICD-10-CM | POA: Diagnosis not present

## 2024-02-08 DIAGNOSIS — C773 Secondary and unspecified malignant neoplasm of axilla and upper limb lymph nodes: Secondary | ICD-10-CM

## 2024-02-08 NOTE — Patient Instructions (Addendum)
 VISIT SUMMARY: Today, we reviewed your lab results and discussed your current medications. We addressed your muscle aches, weakness, fatigue, and dizziness, and made some changes to your medication plan. We also discussed your cholesterol, diabetes, thyroid  function, osteoporosis, Parkinson's disease, and general health maintenance.  YOUR PLAN: MUSCLE ACHES, WEAKNESS, FATIGUE, AND DIZZINESS: You have been experiencing muscle aches, weakness, fatigue, and dizziness, which may be related to your cholesterol medication. -We have discontinued your rosuvastatin  to see if your symptoms improve. -If your symptoms improve, we may consider starting you on Zetia. -If your symptoms persist, we may consider resuming rosuvastatin . -We will recheck your cholesterol in 8 weeks.  HYPERLIPIDEMIA: Your LDL cholesterol is slightly elevated. -We have discontinued your rosuvastatin  to see if your symptoms improve. -If your symptoms improve, we may consider starting you on Zetia. -If your symptoms persist off of the medication, we may consider resuming rosuvastatin . -We will recheck your cholesterol in 8 weeks.  TYPE 2 DIABETES MELLITUS: Your recent A1c is slightly elevated but still within the target range for your age. -Continue with your current diabetes management plan. -We will recheck your A1c in a couple of months.  HYPOTHYROIDISM: Your TSH is slightly elevated, but your T3 and T4 levels are normal. -Continue with your current thyroid  medication. -We will recheck your thyroid  function in a couple of months.  OSTEOPOROSIS: Your bone density scan shows a T-score of -2.6. -We have ordered a repeat bone density scan. -Continue taking your vitamin D  and calcium  supplements.  GENERAL HEALTH MAINTENANCE: You are up to date on most screenings, but you are due for a flu shot. -We administered your flu shot today. -We will obtain records from [facility] for your eye exam. -We will schedule a  follow-up for your mammogram in September 2026.  If you have any problems before your next visit feel free to message me via MyChart (minor issues or questions) or call the office, otherwise you may reach out to schedule an office visit.  Thank you! Saddie Sacks, PA-C

## 2024-02-10 NOTE — Assessment & Plan Note (Signed)
 Follows with oncology.    Current treatment: Letrozole  started 09/14/2021   Breast cancer surveillance: Breast exam 07/04/2023: Benign Mammogram 10/27/2022: Benign breast density category B   Return to clinic in 1 year for follow-up

## 2024-02-10 NOTE — Assessment & Plan Note (Signed)
 Last lipid panel: LDL 103, HDL 62, Trig 156.  Cholesterol managed with rosuvastatin ; LDL slightly elevated. Symptoms may be related to rosuvastatin . - Discontinued rosuvastatin , monitor symptoms. - Consider starting Zetia if symptoms improve. - Consider resuming rosuvastatin  if symptoms persist. - Recheck cholesterol in 8 weeks.

## 2024-02-10 NOTE — Assessment & Plan Note (Signed)
 Hx of mildly elevated TSH that bounces between normal and abnormal. TSH slightly elevated today at 5.830. Recheck thyroid  panel is 6 weeks and consider low dose levothyroxine if TSH remains elevated.

## 2024-02-10 NOTE — Assessment & Plan Note (Signed)
 A1c stable and at goal of <7 at 6.8 today. Continue with diet management for now. No changes to medications. UACR, foot, and eye exam UTD.

## 2024-02-10 NOTE — Assessment & Plan Note (Signed)
 Followed by neurology. Controlled with carbidopa -levodopa 

## 2024-02-10 NOTE — Assessment & Plan Note (Signed)
 Encouraged patient to continue ambulatory blood pressure monitoring and notify for persistent readings >130/80.  Will continue to monitor.

## 2024-02-10 NOTE — Progress Notes (Signed)
 "  Established Patient Office Visit  Subjective   Patient ID: Jamie [name], female    DOB: 05/20/1934  Age: 89 y.o. MRN: [id number]  Chief Complaint  Patient presents with   Medical Management of Chronic Issues    HPI  History of Present Illness   Jamie [name] is an 89 year old female who presents for a follow-up visit to review lab results and medication management.  Myalgias, weakness, fatigue, and dizziness - Muscle aches, weakness, fatigue, and dizziness, particularly on bad days - Currently taking rosuvastatin  5 mg daily for hyperlipidemia since 2020 and thinks the muscle aches may be related to the rosuvastatin   Type 2 diabetes mellitus - Recent A1c slightly increased but remains within target range for age - No changes to diabetes management plan  Thyroid  function abnormalities - TSH slightly elevated on recent labs - T3 and T4 within normal limits - Currently taking Synthroid with no changes to medication regimen or routine  Osteoporosis - Bone density scan in 2022 showed T-score of -2.6 - Takes vitamin D  and calcium  supplements - Not on specific osteoporosis medication - Due for repeat bone density scan  Parkinson's disease and associated symptoms - Takes carbidopa -levodopa  25/100 mg two pills three times daily and 50/200 mg one pill at bedtime - Episodes of weakness and transient symptoms have occurred four times recently - Otherwise feeling well - Managed by neurology   Other medications - Takes letrozole , mirtazapine  at bedtime, and donepezil  in the mornings  Blood pressure elevation and anxiety - Blood pressure slightly elevated during recent visit - Monitors blood pressure periodically at home - History of anxiety related to medical visits, which may contribute to elevated readings          ROS Per HPI.    Objective:     BP 138/76 Comment: Manually  Pulse 81   Temp (!) 97.4 F (36.3 C) (Oral)   Ht 5' 8 (1.727 m)   Wt 203 lb 1.9 oz  (92.1 kg)   SpO2 98%   BMI 30.88 kg/m    Physical Exam Constitutional:      General: She is not in acute distress.    Appearance: Normal appearance.  Cardiovascular:     Rate and Rhythm: Normal rate and regular rhythm.     Heart sounds: Normal heart sounds. No murmur heard.    No friction rub. No gallop.  Pulmonary:     Effort: Pulmonary effort is normal. No respiratory distress.     Breath sounds: Normal breath sounds.  Abdominal:     General: Bowel sounds are normal.  Musculoskeletal:        General: No swelling.     Cervical back: Neck supple.  Lymphadenopathy:     Cervical: No cervical adenopathy.  Skin:    General: Skin is warm and dry.  Neurological:     General: No focal deficit present.     Mental Status: She is alert.  Psychiatric:        Mood and Affect: Mood normal.        Behavior: Behavior normal.        Thought Content: Thought content normal.      No results found for any visits on 02/08/24.  Last CBC Lab Results  Component Value Date   WBC 7.5 01/21/2024   HGB 13.3 01/21/2024   HCT 40.9 01/21/2024   MCV 95.1 01/21/2024   MCH 30.9 01/21/2024   RDW 13.7 01/21/2024   PLT 226 01/21/2024  Last metabolic panel Lab Results  Component Value Date   GLUCOSE 130 (H) 01/21/2024   NA 140 01/21/2024   K 4.3 01/21/2024   CL 104 01/21/2024   CO2 26 01/21/2024   BUN 30 (H) 01/21/2024   CREATININE 0.83 01/21/2024   GFRNONAA >60 01/21/2024   CALCIUM  11.4 (H) 01/21/2024   PHOS 3.1 09/10/2023   PROT 7.4 01/21/2024   ALBUMIN 4.7 01/21/2024   LABGLOB 2.3 10/08/2023   AGRATIO 2.0 08/02/2022   BILITOT 0.4 01/21/2024   ALKPHOS 107 01/21/2024   AST 22 01/21/2024   ALT <5 01/21/2024   ANIONGAP 10 01/21/2024   Last lipids Lab Results  Component Value Date   CHOL 192 02/01/2024   HDL 62 02/01/2024   LDLCALC 103 (H) 02/01/2024   LDLDIRECT 95 10/01/2019   TRIG 156 (H) 02/01/2024   CHOLHDL 3.1 02/01/2024   Last hemoglobin A1c Lab Results  Component  Value Date   HGBA1C 6.8 (H) 02/01/2024   Last thyroid  functions Lab Results  Component Value Date   TSH 5.830 (H) 02/01/2024   T3TOTAL 82 11/22/2021   T4TOTAL 5.7 02/01/2024   FREET4 1.16 10/08/2023   Last vitamin D  Lab Results  Component Value Date   VD25OH 58.0 12/09/2018      The ASCVD Risk score (Arnett DK, et al., 2019) failed to calculate for the following reasons:   The 2019 ASCVD risk score is only valid for ages 49 to 9   * - Cholesterol units were assumed    Assessment & Plan:   Encounter for vaccination -     Flu vaccine HIGH DOSE PF(Fluzone Trivalent)  Type 2 diabetes mellitus without complication, without long-term current use of insulin (HCC) Assessment & Plan: A1c stable and at goal of <7 at 6.8 today. Continue with diet management for now. No changes to medications. UACR, foot, and eye exam UTD.   Orders: -     CBC with Differential/Platelet; Future -     Comprehensive metabolic panel with GFR; Future -     Lipid panel; Future  Mixed hyperlipidemia -     CBC with Differential/Platelet; Future -     Comprehensive metabolic panel with GFR; Future -     Lipid panel; Future  Acquired hypothyroidism Assessment & Plan: Hx of mildly elevated TSH that bounces between normal and abnormal. TSH slightly elevated today at 5.830. Recheck thyroid  panel is 6 weeks and consider low dose levothyroxine if TSH remains elevated.  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 Will continue to monitor.    Parkinson's disease with dyskinesia, unspecified whether manifestations fluctuate (HCC) Assessment & Plan: Followed by  neurology. Controlled with carbidopa -levodopa     Malignant neoplasm of lower-inner quadrant of left breast in female, estrogen receptor positive (HCC) Assessment & Plan: Follows with oncology.    Current treatment: Letrozole  started 09/14/2021   Breast cancer surveillance: Breast exam 07/04/2023: Benign Mammogram 10/27/2022: Benign breast density category B   Return to clinic in 1 year for follow-up   Mixed diabetic hyperlipidemia associated with type 2 diabetes mellitus (HCC) Assessment & Plan: Last lipid panel: LDL 103, HDL 62, Trig 156.  Cholesterol managed with rosuvastatin ;  LDL slightly elevated. Symptoms may be related to rosuvastatin . - Discontinued rosuvastatin , monitor symptoms. - Consider starting Zetia if symptoms improve. - Consider resuming rosuvastatin  if symptoms persist. - Recheck cholesterol in 8 weeks.      Return in about 9 weeks (around 04/11/2024) for HTN, HLD, DM.    Saddie [name] Sacks, PA-C "

## 2024-02-12 NOTE — Progress Notes (Signed)
 " [name] [address] [place] [address] Phone: [phone] Subjective:   [name], am serving as a scribe for Dr. Arthea Claudene.  I'm seeing this patient by the request  of:  Clapp, Kara F, PA-C  CC: Bilateral knee pain  YEP:Dlagzrupcz  12/18/2023 Still underlying concern on this as well.  Discussed icing regimen and home exercises, discussed which activities to do and which ones to avoid.  Increase activity slowly.  Follow-up again in 6 to 8 weeks   Update 02/18/2024 Jamie [name] is a 89 y.o. female coming in with complaint of B knee pain. Patient states that she   Reviewing patient's chart has had 2 other emergency room visits for urinary tract infection causing altered mental status and a near syncopal episode.  Past Medical History:  Diagnosis Date   Abnormality of gait 04/18/2017   Breast cancer (HCC)    Left breast mastectomy.   Degenerative joint disease of knee, right 03/19/2018   Injected April 18, 2018 Approved for Durolane R knee 3.4.20. Patient given another steroid injection Jun 24, 2018.  Repeat injection September 09, 2018 repeat injection February 27, 2019 repeat 6/110/20221 bilateral injections given December 01, 2019 Right knee injected April 20, 2020 monovisc May 19, 2018 Repeat steroid injection given June 16, 2020 repeat injection August 24, 2020   Essential hypertension 08/14/2016   Family history of breast cancer 07/14/2021   Generalized anxiety disorder 06/27/2017   History of dizziness 06/27/2017   History of hysterectomy for benign disease 06/27/2017   History of vitamin D  deficiency 08/22/2017   Leukocytes in urine 08/14/2016   Lumbar facet arthropathy 06/27/2017   Major neurocognitive disorder due to multiple etiologies 09/29/2020   Mixed hyperlipidemia 08/22/2017   Osteoarthritis of knee 09/18/2017   Pancolitis 08/14/2016   Parkinson's disease (HCC) 03/13/2017   Personal history of chemotherapy     Prediabetes 08/22/2017   Right knee pain    Spinal stenosis of lumbar region    Weakness of both hips 04/18/2017   White coat syndrome without diagnosis of hypertension 08/22/2017   Past Surgical History:  Procedure Laterality Date   BREAST BIOPSY Left    06/2021   HYSTERECTOMY ABDOMINAL WITH SALPINGECTOMY     MASTECTOMY W/ SENTINEL NODE BIOPSY Left 08/18/2021   Procedure: LEFT MASTECTOMY WITH TARGETED LYMPH NODE DISSECTION;  Surgeon: Vernetta Berg, MD;  Location: [facility];  Service: General;  Laterality: Left;   TONSILLECTOMY     TOTAL ABDOMINAL HYSTERECTOMY W/ BILATERAL SALPINGOOPHORECTOMY     Social History   Socioeconomic History   Marital status: Widowed    Spouse name: Not on file   Number of children: 3   Years of education: 12   Highest education level: High school graduate  Occupational History   Not on file  Tobacco Use   Smoking status: Never    Passive exposure: Never   Smokeless tobacco: Never  Vaping Use   Vaping status: Never Used  Substance and Sexual Activity   Alcohol use: No    Alcohol/week: 0.0 standard drinks of alcohol   Drug use: No   Sexual activity: Not Currently    Birth control/protection: Surgical  Other Topics Concern   Not on file  Social History Narrative   Pt lives alone in her 1 story home- she has 3 sons, and a high school graduate. She is right handed, she drinks coffee, and one soda a week mostly water  Social Drivers of Health   Tobacco Use: Low Risk (02/08/2024)   Patient History    Smoking Tobacco Use: Never    Smokeless Tobacco Use: Never    Passive Exposure: Never  Financial Resource Strain: Low Risk (01/03/2024)   Overall Financial Resource Strain (CARDIA)    Difficulty of Paying Living Expenses: Not hard at all  Food Insecurity: No Food Insecurity (01/03/2024)   Epic    Worried About Programme Researcher, Broadcasting/film/video in the Last Year: Never true    Ran Out of Food in the Last Year: Never true  Transportation  Needs: No Transportation Needs (01/03/2024)   Epic    Lack of Transportation (Medical): No    Lack of Transportation (Non-Medical): No  Physical Activity: Insufficiently Active (01/03/2024)   Exercise Vital Sign    Days of Exercise per Week: 3 days    Minutes of Exercise per Session: 30 min  Stress: No Stress Concern Present (01/03/2024)   [employer] of Occupational Health - Occupational Stress Questionnaire    Feeling of Stress: Not at all  Social Connections: Socially Isolated (01/03/2024)   Social Connection and Isolation Panel    Frequency of Communication with Friends and Family: More than three times a week    Frequency of Social Gatherings with Friends and Family: More than three times a week    Attends Religious Services: Never    Database Administrator or Organizations: No    Attends Banker Meetings: Never    Marital Status: Widowed  Depression (PHQ2-9): Low Risk (02/08/2024)   Depression (PHQ2-9)    PHQ-2 Score: 0  Alcohol Screen: Low Risk (01/03/2024)   Alcohol Screen    Last Alcohol Screening Score (AUDIT): 0  Housing: Unknown (01/03/2024)   Epic    Unable to Pay for Housing in the Last Year: No    Number of Times Moved in the Last Year: Not on file    Homeless in the Last Year: No  Utilities: Not At Risk (01/03/2024)   Epic    Threatened with loss of utilities: No  Health Literacy: Adequate Health Literacy (01/03/2024)   B1300 Health Literacy    Frequency of need for help with medical instructions: Never   Allergies[1] Family History  Problem Relation Age of Onset   Breast cancer Mother 41   Melanoma Father        dx. 24s   Lung cancer Father        dx. 79s   Healthy Son     Current Outpatient Medications (Cardiovascular):    rosuvastatin  (CRESTOR ) 5 MG tablet, TAKE 1 TABLET (5 MG TOTAL) BY MOUTH AT BEDTIME.  Current Outpatient Medications (Other):    carbidopa -levodopa  (SINEMET  CR) 50-200 MG tablet, TAKE 1 TABLET BY MOUTH AT  BEDTIME.   carbidopa -levodopa  (SINEMET  IR) 25-100 MG tablet, TAKE 2 TABLETS BY MOUTH 3 TIMES DAILY.   Cholecalciferol (VITAMIN D -3 PO), Take 1 capsule by mouth daily after lunch.   donepezil  (ARICEPT ) 10 MG tablet, TAKE 1 TABLET (10 MG TOTAL) BY MOUTH AT BEDTIME. (Patient taking differently: Take 10 mg by mouth daily before breakfast.)   letrozole  (FEMARA ) 2.5 MG tablet, TAKE 1 TABLET (2.5 MG TOTAL) BY MOUTH DAILY.   mirtazapine  (REMERON ) 15 MG tablet, TAKE 1 TABLET BY MOUTH AT BEDTIME.   Multiple Vitamins-Minerals (PRESERVISION AREDS PO), Take 1 capsule by mouth See admin instructions. Take 1 capsule by mouth twice daily, 1 capsule after breakfast and 1 capsule after lunch.   omeprazole  (  PRILOSEC) 20 MG capsule, TAKE 1 CAPSULE BY MOUTH DAILY AT 5:30 PM   OVER THE COUNTER MEDICATION, Take 2 tablets by mouth daily after lunch. Joint Health.   OVER THE COUNTER MEDICATION, Take 2 tablets by mouth daily after breakfast. Shaklee Cholesterol Reduction Complex   OVER THE COUNTER MEDICATION, Take 1 tablet by mouth daily after breakfast. Shaklee OptiFlora   OVER THE COUNTER MEDICATION, Take 2 tablets by mouth at bedtime as needed (constipation). Shaklee Herb-Lax   VITAMIN E PO, Take 1 capsule by mouth daily after lunch.   Reviewed prior external information including notes and imaging from  primary care provider As well as notes that were available from care everywhere and other healthcare systems.  Past medical history, social, surgical and family history all reviewed in electronic medical record.  No pertanent information unless stated regarding to the chief complaint.   Review of Systems:  No headache, visual changes, nausea, vomiting, diarrhea, constipation, dizziness, abdominal pain, skin rash, fevers, chills, night sweats, weight loss, swollen lymph nodes, body aches, joint swelling, chest pain, shortness of breath, mood changes. POSITIVE muscle aches  Objective  Blood pressure 112/74,  pulse 91, height 5' 8 (1.727 m), weight 202 lb (91.6 kg), SpO2 98%.   General: No apparent distress alert and oriented x3 mood and affect normal, dressed appropriately.  HEENT: Pupils equal, extraocular movements intact  Respiratory: Patient's speak in full sentences and does not appear short of breath  Cardiovascular: No lower extremity edema, non tender, no erythema  Hypertonicity is noted.  Patient does have a shuffling gait noted.  Some difficulty with ambulation with antalgic gait.  After informed written and verbal consent, patient was seated on exam table. Right knee was prepped with alcohol swab and utilizing anterolateral approach, patient's right knee space was injected with 4:1  marcaine  0.5%: Kenalog  40mg /dL. Patient tolerated the procedure well without immediate complications.  After informed written and verbal consent, patient was seated on exam table. Left knee was prepped with alcohol swab and utilizing anterolateral approach, patient's left knee space was injected with 4:1  marcaine  0.5%: Kenalog  40mg /dL. Patient tolerated the procedure well without immediate complications.    Impression and Recommendations:     The above documentation has been reviewed and is accurate and complete Arthea CHRISTELLA Sharps, DO       [1]  Allergies Allergen Reactions   Flagyl  [Metronidazole ] Rash   "

## 2024-02-16 ENCOUNTER — Other Ambulatory Visit: Payer: Self-pay | Admitting: Neurology

## 2024-02-16 DIAGNOSIS — G20A1 Parkinson's disease without dyskinesia, without mention of fluctuations: Secondary | ICD-10-CM

## 2024-02-18 ENCOUNTER — Ambulatory Visit: Admitting: Family Medicine

## 2024-02-18 ENCOUNTER — Encounter: Payer: Self-pay | Admitting: Family Medicine

## 2024-02-18 VITALS — BP 112/74 | HR 91 | Ht 68.0 in | Wt 202.0 lb

## 2024-02-18 DIAGNOSIS — M170 Bilateral primary osteoarthritis of knee: Secondary | ICD-10-CM | POA: Diagnosis not present

## 2024-02-18 NOTE — Patient Instructions (Signed)
 Injected both knee today See me again in 2 months

## 2024-02-18 NOTE — Assessment & Plan Note (Addendum)
 Chronic problem with worsening symptoms.  Secondary to patient's age and comorbidities surgical intervention.  It is responding well to injections every 8 to 12 weeks.  Follow-up with me again in that timeframe.  Social determinants of health include patient is physically inactive secondary to other comorbidities.

## 2024-02-22 ENCOUNTER — Ambulatory Visit: Admitting: Neurology

## 2024-02-28 ENCOUNTER — Other Ambulatory Visit: Payer: Self-pay | Admitting: Neurology

## 2024-02-28 DIAGNOSIS — G20A1 Parkinson's disease without dyskinesia, without mention of fluctuations: Secondary | ICD-10-CM

## 2024-03-03 NOTE — Progress Notes (Unsigned)
 "   Assessment/Plan:   1.  Parkinsons Disease/SWEDD  -continue carbidopa /levodopa  25/100, 2 tablets 3 times daily for now.    -continue carbidopa /levodopa  50/200 CR 30 min prior to bedtime  2.  Insomnia  -Has received benefit with melatonin, 2.5 mg nightly and will continue that  3.  Depression  -Continue Lexapro , 20 mg daily.    -On mirtazapine , 15 mg at bedtime by primary care for mood and appetite.    4.  Dementia, mixed type, PDD and AD  -This was identified on neurocognitive testing in August, 2022 and getting worse with time  -pt has caregivers with her in the home most of the day/night (all but 2-4pm)  -continue donepezil  to 10 mg daily 5.  Breast cancer  -Status post left mastectomy, currently undergoing chemotherapy.  Has completed Herceptin   6.  Urinary incontinence at night  -saw Dr. Alvia one time and doing well.  7.  History of blood pressure fluctuations  -We discussed concept of permissive hypertension in Parkinsons.  Doing well right now  8.  Syncope  - She has had several syncopal episodes and I am not convinced that these are Parkinson's related given that these occurred while seated.  She had 1 in which she was eating breakfast and passed out right into the plate of pancakes.  There have been various theories going around about the source of these, including just intermittent cognitive change with Parkinson's disease, but that would not be the case.  Parkinson's patients, even those with dementia, do not pass out into their plate of pancakes due to cognitive change.  In addition, it would be very unusual to have multiple syncopal episodes while seated, almost always all while eating, and never upon standing from Parkinson's disease.  - I do think it important that patient be worked up by cardiology to make sure that these are not arrhythmias  Subjective:   Jamie [name] was seen today in follow up for Parkinsons disease.  My previous records were reviewed  prior to todays visit as well as outside records available to me.  Patient with daughter in law who supplements the history.  Much has happened since our last visit and records are reviewed.  I last saw the patient in June.  She presented to the hospital July 20 after a syncopal episode while seated.  She was eating breakfast and her face actually planted into the plate of pancakes when she passed out.  She was admitted to the hospital and ultimately they felt that it was related to her labile blood pressures.  She was brought to the ER November 6 after having a staring off episode where she was alert but not following directions.  At that visit, her daughter-in-law reported that the patient had been having intermittent episodes for 2 weeks where she would stare off while seated and not all for 10 seconds.  Emergency room workup was negative and she was discharged from the emergency room.  About a month later, she was back in the emergency room with similar symptoms.  She was eating and she became unresponsive and slumped in her chair.  She came back around and then had 2 more episodes.  She did not shake.  She did not lose control of her bladder and bowel.  This time, she was actually slightly hypertensive in the emergency room and they felt that the issue was a cognitive change due to her Parkinson's disease.  Current prescribed movement disorder medications: Carbidopa /levodopa  25/100, 2  tablets 3 times per day  Carbidopa /levodopa  50/200 CR at bedtime  Melatonin, 3 mg daily Lexapro  20 mg Mirtazapine , 15 mg nightly Donepezil , 10 mg daily    ALLERGIES:   Allergies  Allergen Reactions   Flagyl  [Metronidazole ] Rash    CURRENT MEDICATIONS:  Outpatient Encounter Medications as of 03/04/2024  Medication Sig   carbidopa -levodopa  (SINEMET  CR) 50-200 MG tablet TAKE 1 TABLET BY MOUTH AT BEDTIME.   carbidopa -levodopa  (SINEMET  IR) 25-100 MG tablet TAKE 2 TABLETS BY MOUTH 3 TIMES DAILY.   Cholecalciferol  (VITAMIN D -3 PO) Take 1 capsule by mouth daily after lunch.   donepezil  (ARICEPT ) 10 MG tablet TAKE 1 TABLET (10 MG TOTAL) BY MOUTH AT BEDTIME. (Patient taking differently: Take 10 mg by mouth daily before breakfast.)   letrozole  (FEMARA ) 2.5 MG tablet TAKE 1 TABLET (2.5 MG TOTAL) BY MOUTH DAILY.   mirtazapine  (REMERON ) 15 MG tablet TAKE 1 TABLET BY MOUTH AT BEDTIME.   Multiple Vitamins-Minerals (PRESERVISION AREDS PO) Take 1 capsule by mouth See admin instructions. Take 1 capsule by mouth twice daily, 1 capsule after breakfast and 1 capsule after lunch.   omeprazole  (PRILOSEC) 20 MG capsule TAKE 1 CAPSULE BY MOUTH DAILY AT 5:30 PM   OVER THE COUNTER MEDICATION Take 2 tablets by mouth daily after lunch. [facility] Health.   OVER THE COUNTER MEDICATION Take 2 tablets by mouth daily after breakfast. [facility] Cholesterol Reduction Complex   OVER THE COUNTER MEDICATION Take 1 tablet by mouth daily after breakfast. [facility] OptiFlora   OVER THE COUNTER MEDICATION Take 2 tablets by mouth at bedtime as needed (constipation). [facility]lee Herb-Lax   rosuvastatin  (CRESTOR ) 5 MG tablet TAKE 1 TABLET (5 MG TOTAL) BY MOUTH AT BEDTIME.   VITAMIN E PO Take 1 capsule by mouth daily after lunch.   [DISCONTINUED] donepezil  (ARICEPT ) 5 MG tablet Take 1 tablet (5 mg total) by mouth at bedtime.   No facility-administered encounter medications on file as of 03/04/2024.    Objective:   PHYSICAL EXAMINATION:    VITALS:   There were no vitals filed for this visit.   Wt Readings from Last 3 Encounters:  02/18/24 202 lb (91.6 kg)  02/08/24 203 lb 1.9 oz (92.1 kg)  12/18/23 194 lb (88 kg)    GEN:  The patient appears stated age and is in NAD. HEENT:  Normocephalic, atraumatic.  The mucous membranes are moist.  CV:  RRR Lungs:  CTAB  Neurological examination:  Orientation: The patient is alert and oriented x3. Cranial nerves: There is good facial symmetry with significant facial hypomimia. The speech is fluent  and clear. Soft palate rises symmetrically and there is no tongue deviation. Hearing is intact to conversational tone. Sensation: Sensation is intact to light touch throughout Motor: Strength is at least antigravity x4.  Movement examination: Tone: There is no significant rigidity today. Abnormal movements: None Coordination:  There is mild decremation with finger taps on the R Gait and Station: The patient pushes off to arise.  She is slow and slightly marches as she ambulates.  She has mild decreased arm swing.    I have reviewed and interpreted the following labs independently    Chemistry      Component Value Date/Time   NA 140 01/21/2024 1453   NA 138 10/08/2023 1609   K 4.3 01/21/2024 1453   CL 104 01/21/2024 1453   CO2 26 01/21/2024 1453   BUN 30 (H) 01/21/2024 1453   BUN 22 10/08/2023 1609   CREATININE 0.83 01/21/2024  1453   CREATININE 0.86 07/13/2021 0831   GLU 98 05/30/2017 0000      Component Value Date/Time   CALCIUM  11.4 (H) 01/21/2024 1453   ALKPHOS 107 01/21/2024 1453   AST 22 01/21/2024 1453   AST 18 07/13/2021 0831   ALT <5 01/21/2024 1453   ALT 18 07/13/2021 0831   BILITOT 0.4 01/21/2024 1453   BILITOT 0.4 10/08/2023 1609   BILITOT 0.6 07/13/2021 0831      Lab Results  Component Value Date   WBC 7.5 01/21/2024   HGB 13.3 01/21/2024   HCT 40.9 01/21/2024   MCV 95.1 01/21/2024   PLT 226 01/21/2024    Lab Results  Component Value Date   TSH 5.830 (H) 02/01/2024   Total time spent on today's visit was *** minutes, including both face-to-face time and nonface-to-face time.  Time included that spent on review of records (prior notes available to me/labs/imaging if pertinent), discussing treatment and goals, answering patient's questions and coordinating care.   Cc:  Gayle Saddie FALCON, PA-C "

## 2024-03-04 ENCOUNTER — Encounter: Payer: Self-pay | Admitting: Neurology

## 2024-03-04 ENCOUNTER — Ambulatory Visit: Admitting: Neurology

## 2024-03-04 VITALS — BP 126/82 | HR 67 | Wt 205.4 lb

## 2024-03-04 DIAGNOSIS — G20A1 Parkinson's disease without dyskinesia, without mention of fluctuations: Secondary | ICD-10-CM

## 2024-03-04 DIAGNOSIS — R635 Abnormal weight gain: Secondary | ICD-10-CM

## 2024-03-04 DIAGNOSIS — F02B3 Dementia in other diseases classified elsewhere, moderate, with mood disturbance: Secondary | ICD-10-CM

## 2024-03-04 DIAGNOSIS — R55 Syncope and collapse: Secondary | ICD-10-CM | POA: Diagnosis not present

## 2024-03-05 ENCOUNTER — Other Ambulatory Visit: Payer: Self-pay

## 2024-03-13 ENCOUNTER — Ambulatory Visit: Admitting: Cardiology

## 2024-03-17 ENCOUNTER — Ambulatory Visit

## 2024-03-20 ENCOUNTER — Ambulatory Visit: Admitting: Cardiology

## 2024-03-27 ENCOUNTER — Ambulatory Visit: Admitting: Cardiology

## 2024-04-04 ENCOUNTER — Other Ambulatory Visit

## 2024-04-07 ENCOUNTER — Ambulatory Visit: Admitting: Cardiology

## 2024-04-11 ENCOUNTER — Ambulatory Visit

## 2024-04-14 ENCOUNTER — Ambulatory Visit

## 2024-04-22 ENCOUNTER — Ambulatory Visit: Admitting: Family Medicine

## 2024-07-03 ENCOUNTER — Ambulatory Visit: Admitting: Hematology and Oncology

## 2024-09-11 ENCOUNTER — Ambulatory Visit: Payer: Self-pay | Admitting: Neurology
